# Patient Record
Sex: Female | Born: 1945 | Race: White | Hispanic: No | Smoking: Former smoker
Health system: Southern US, Community
[De-identification: ages and names within clinical notes are randomized; demographics above are authoritative.]

## PROBLEM LIST (undated history)

## (undated) DIAGNOSIS — M1990 Unspecified osteoarthritis, unspecified site: Secondary | ICD-10-CM

## (undated) DIAGNOSIS — R519 Headache, unspecified: Secondary | ICD-10-CM

## (undated) DIAGNOSIS — C801 Malignant (primary) neoplasm, unspecified: Secondary | ICD-10-CM

## (undated) DIAGNOSIS — I4891 Unspecified atrial fibrillation: Secondary | ICD-10-CM

## (undated) DIAGNOSIS — K219 Gastro-esophageal reflux disease without esophagitis: Secondary | ICD-10-CM

## (undated) DIAGNOSIS — E119 Type 2 diabetes mellitus without complications: Secondary | ICD-10-CM

## (undated) DIAGNOSIS — I10 Essential (primary) hypertension: Secondary | ICD-10-CM

## (undated) DIAGNOSIS — I350 Nonrheumatic aortic (valve) stenosis: Secondary | ICD-10-CM

## (undated) DIAGNOSIS — D649 Anemia, unspecified: Secondary | ICD-10-CM

## (undated) DIAGNOSIS — G4730 Sleep apnea, unspecified: Secondary | ICD-10-CM

## (undated) DIAGNOSIS — R51 Headache: Secondary | ICD-10-CM

## (undated) HISTORY — DX: Nonrheumatic aortic (valve) stenosis: I35.0

## (undated) HISTORY — DX: Unspecified atrial fibrillation: I48.91

## (undated) HISTORY — PX: BREAST BIOPSY: SHX20

## (undated) HISTORY — PX: OTHER SURGICAL HISTORY: SHX169

## (undated) HISTORY — PX: ABDOMINAL HYSTERECTOMY: SHX81

## (undated) HISTORY — PX: JOINT REPLACEMENT: SHX530

---

## 1998-03-19 ENCOUNTER — Ambulatory Visit (HOSPITAL_COMMUNITY): Admission: RE | Admit: 1998-03-19 | Discharge: 1998-03-19 | Payer: Self-pay | Admitting: Surgery

## 1999-09-14 ENCOUNTER — Encounter: Admission: RE | Admit: 1999-09-14 | Discharge: 1999-09-14 | Payer: Self-pay | Admitting: *Deleted

## 2000-03-08 ENCOUNTER — Emergency Department (HOSPITAL_COMMUNITY): Admission: EM | Admit: 2000-03-08 | Discharge: 2000-03-08 | Payer: Self-pay | Admitting: *Deleted

## 2000-03-08 ENCOUNTER — Encounter: Payer: Self-pay | Admitting: Emergency Medicine

## 2000-03-09 ENCOUNTER — Ambulatory Visit (HOSPITAL_COMMUNITY): Admission: RE | Admit: 2000-03-09 | Discharge: 2000-03-09 | Payer: Self-pay | Admitting: Orthopedic Surgery

## 2000-03-09 ENCOUNTER — Encounter: Payer: Self-pay | Admitting: Orthopedic Surgery

## 2000-09-14 ENCOUNTER — Encounter: Admission: RE | Admit: 2000-09-14 | Discharge: 2000-09-14 | Payer: Self-pay | Admitting: Family Medicine

## 2000-09-14 ENCOUNTER — Encounter: Payer: Self-pay | Admitting: Family Medicine

## 2000-11-10 ENCOUNTER — Ambulatory Visit (HOSPITAL_COMMUNITY): Admission: RE | Admit: 2000-11-10 | Discharge: 2000-11-10 | Payer: Self-pay | Admitting: Gastroenterology

## 2000-11-10 ENCOUNTER — Encounter (INDEPENDENT_AMBULATORY_CARE_PROVIDER_SITE_OTHER): Payer: Self-pay

## 2000-11-16 ENCOUNTER — Ambulatory Visit (HOSPITAL_COMMUNITY): Admission: RE | Admit: 2000-11-16 | Discharge: 2000-11-16 | Payer: Self-pay | Admitting: Orthopedic Surgery

## 2001-09-15 ENCOUNTER — Encounter: Payer: Self-pay | Admitting: Family Medicine

## 2001-09-15 ENCOUNTER — Encounter: Admission: RE | Admit: 2001-09-15 | Discharge: 2001-09-15 | Payer: Self-pay | Admitting: Family Medicine

## 2003-10-18 ENCOUNTER — Inpatient Hospital Stay (HOSPITAL_COMMUNITY): Admission: RE | Admit: 2003-10-18 | Discharge: 2003-10-22 | Payer: Self-pay | Admitting: Orthopedic Surgery

## 2006-09-07 ENCOUNTER — Ambulatory Visit (HOSPITAL_COMMUNITY): Admission: RE | Admit: 2006-09-07 | Discharge: 2006-09-07 | Payer: Self-pay | Admitting: Gastroenterology

## 2006-09-07 ENCOUNTER — Encounter (INDEPENDENT_AMBULATORY_CARE_PROVIDER_SITE_OTHER): Payer: Self-pay | Admitting: *Deleted

## 2006-09-23 ENCOUNTER — Ambulatory Visit (HOSPITAL_COMMUNITY): Admission: RE | Admit: 2006-09-23 | Discharge: 2006-09-23 | Payer: Self-pay | Admitting: Gastroenterology

## 2007-08-08 ENCOUNTER — Ambulatory Visit: Payer: Self-pay | Admitting: Internal Medicine

## 2007-08-09 ENCOUNTER — Ambulatory Visit (HOSPITAL_BASED_OUTPATIENT_CLINIC_OR_DEPARTMENT_OTHER): Admission: RE | Admit: 2007-08-09 | Discharge: 2007-08-09 | Payer: Self-pay | Admitting: Internal Medicine

## 2007-08-12 ENCOUNTER — Ambulatory Visit: Payer: Self-pay | Admitting: Internal Medicine

## 2007-08-24 ENCOUNTER — Ambulatory Visit: Payer: Self-pay | Admitting: Internal Medicine

## 2007-10-13 ENCOUNTER — Ambulatory Visit: Payer: Self-pay | Admitting: Internal Medicine

## 2007-11-22 ENCOUNTER — Encounter: Payer: Self-pay | Admitting: Internal Medicine

## 2008-01-11 DIAGNOSIS — G4733 Obstructive sleep apnea (adult) (pediatric): Secondary | ICD-10-CM | POA: Insufficient documentation

## 2008-01-12 ENCOUNTER — Ambulatory Visit: Payer: Self-pay | Admitting: Internal Medicine

## 2008-01-17 ENCOUNTER — Ambulatory Visit (HOSPITAL_COMMUNITY): Admission: RE | Admit: 2008-01-17 | Discharge: 2008-01-17 | Payer: Self-pay | Admitting: Family Medicine

## 2008-01-17 IMAGING — US US TRANSVAGINAL NON-OB
1 series · 14 of 25 positions shown · non-contrast
Comparison: none

CLINICAL DATA: Pelvic pressure and urinary frequency.  Previous hysterectomy.  Evaluate for pelvic or ovarian mass.
 TRANSABDOMINAL AND TRANSVAGINAL PELVIC ULTRASOUND:
TECHNIQUE: Both transabdominal and transvaginal ultrasound examinations of the pelvis were performed including evaluation of the uterus, ovaries, adnexal regions, and pelvic cul-de-sac.

[Series 1: us transvaginal non-ob · 14 of 43 slices shown]
[im 1/43]
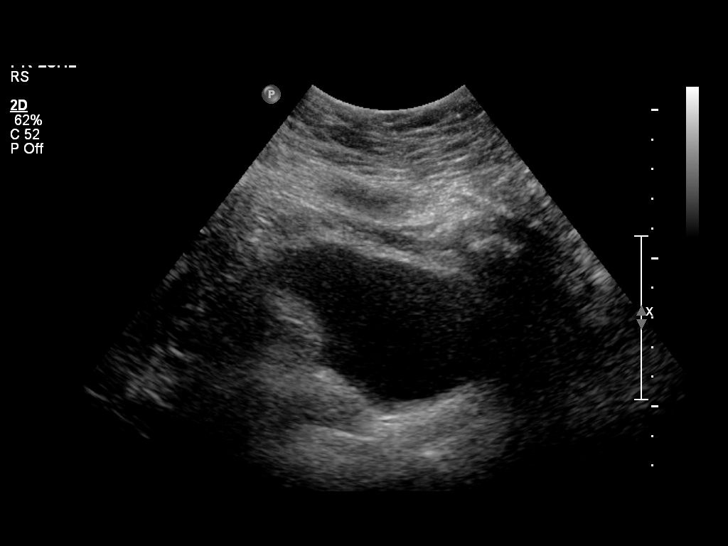
[im 4/43]
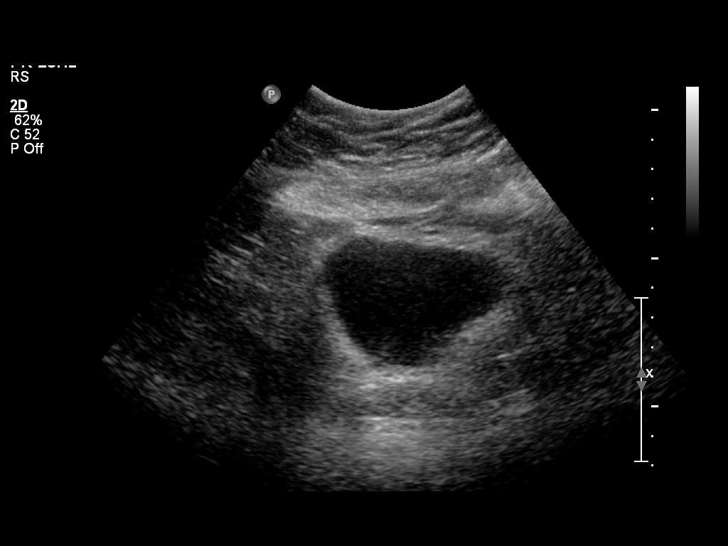
[im 8/43]
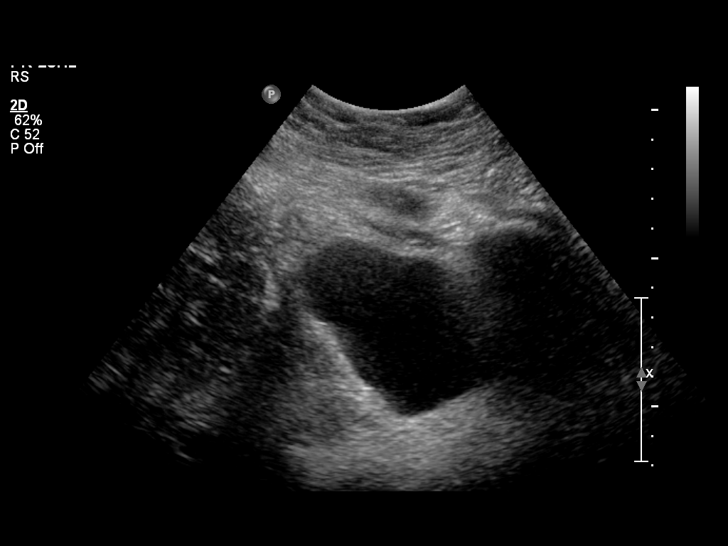
[im 11/43]
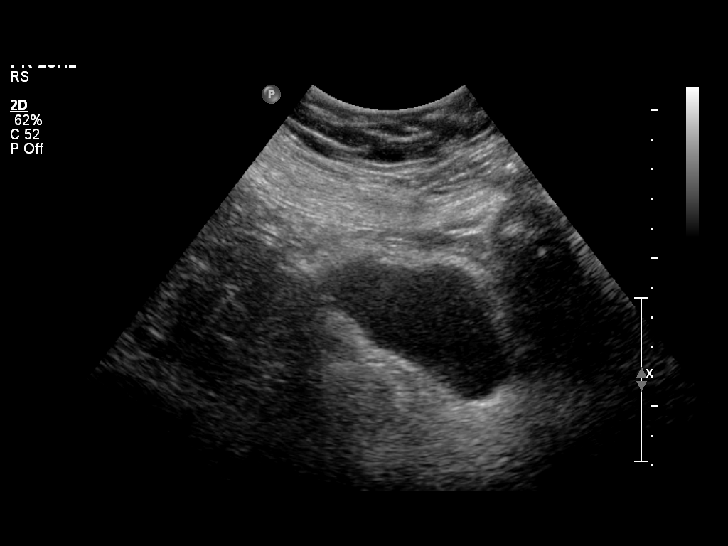
[im 15/43]
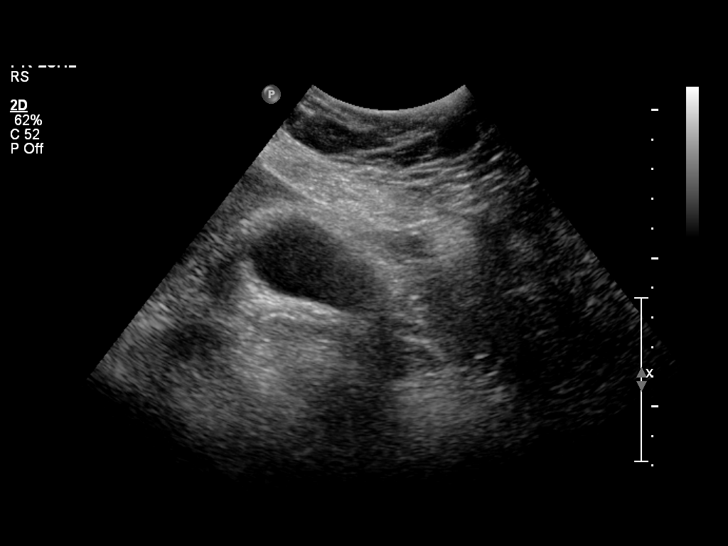
[im 16/43]
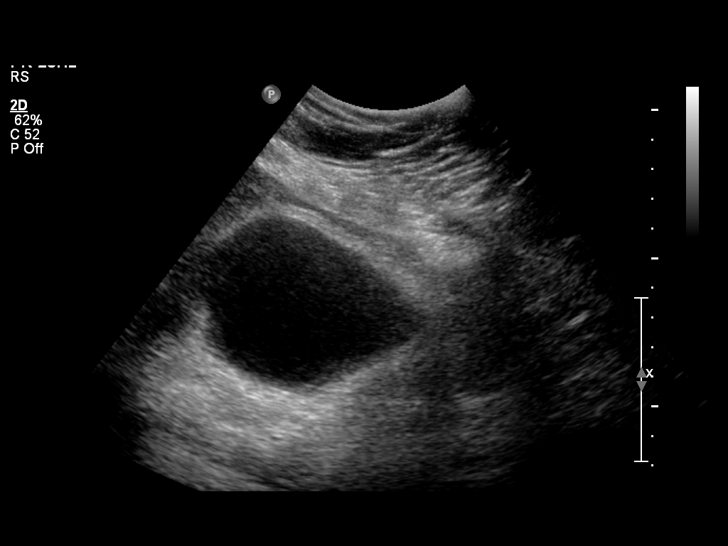
[im 20/43]
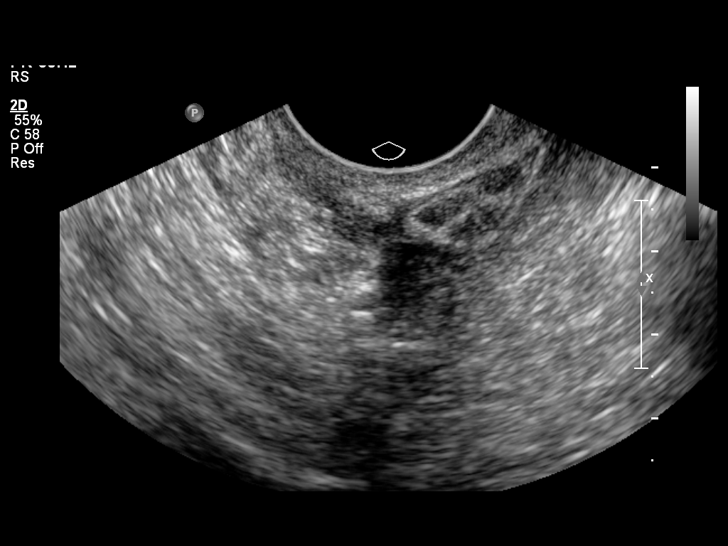
[im 23/43]
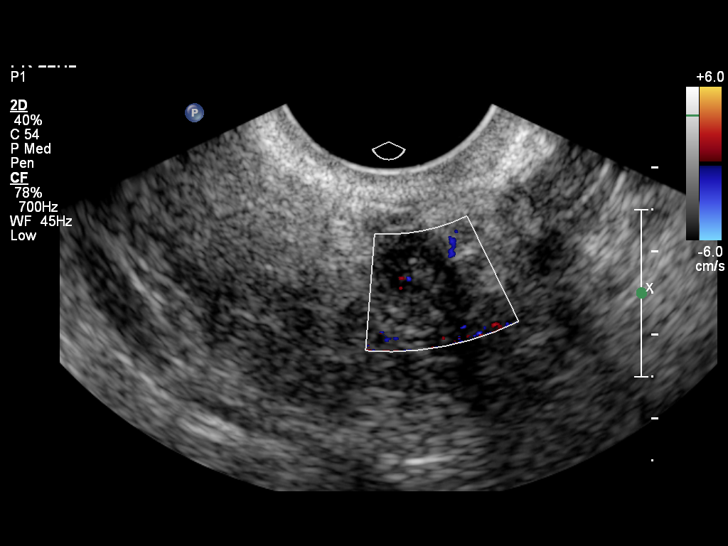
[im 27/43]
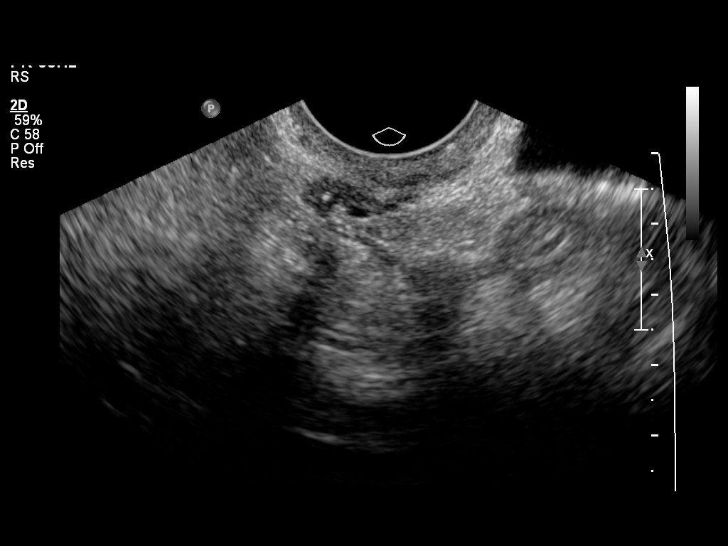
[im 29/43]
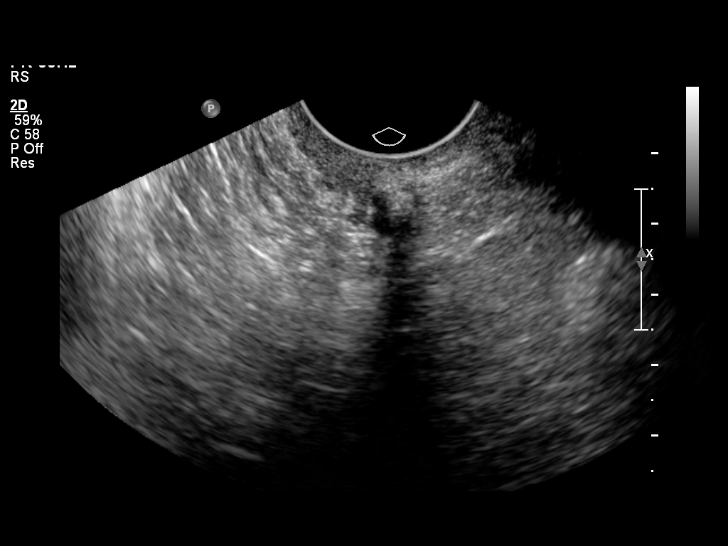
[im 32/43]
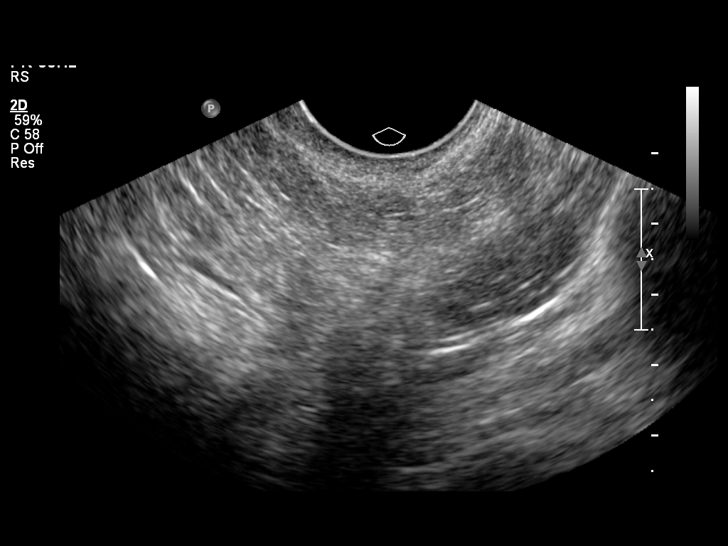
[im 36/43]
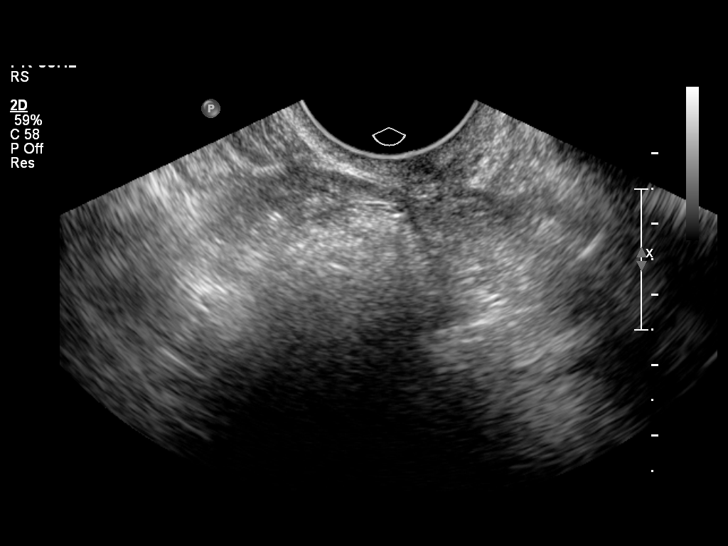
[im 39/43]
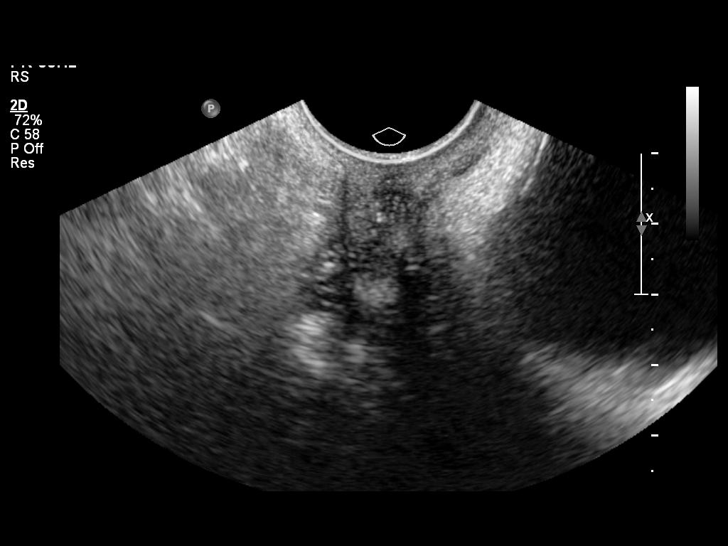
[im 43/43]
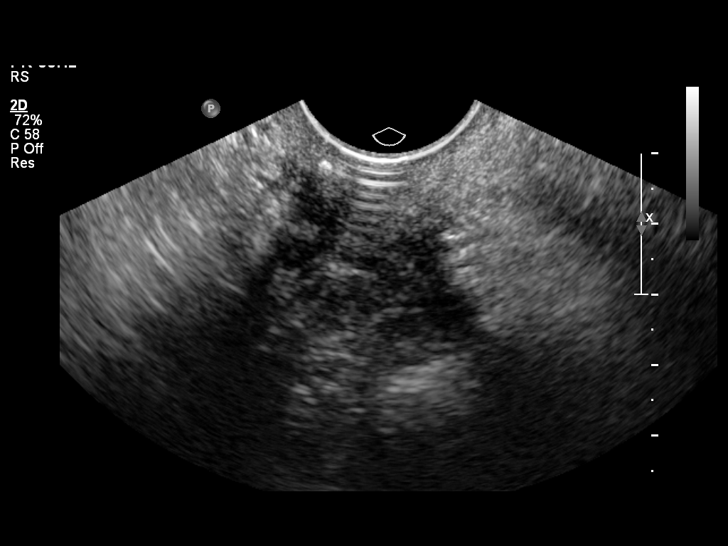

[14 of 25 positions shown; findings below may reference images not displayed]

FINDINGS: The uterus is surgically absent.  The vaginal cuff is unremarkable in appearance.  The urinary bladder is also unremarkable in appearance on transabdominal sonography.
 A small postmenopausal left ovary is visualized on transvaginal sonography.  The right ovary was not directly visualized by either transabdominal or transvaginal approach.  However, no adnexal masses are identified.  There is no evidence of free fluid.
IMPRESSION: 1.  Prior hysterectomy.
 2.  No evidence of pelvic or adnexal mass.  Normal postmenopausal left ovary.  Nonvisualization of right ovary.

## 2008-02-05 ENCOUNTER — Encounter: Admission: RE | Admit: 2008-02-05 | Discharge: 2008-02-05 | Payer: Self-pay | Admitting: Family Medicine

## 2008-02-06 ENCOUNTER — Emergency Department (HOSPITAL_COMMUNITY): Admission: EM | Admit: 2008-02-06 | Discharge: 2008-02-06 | Payer: Self-pay | Admitting: Emergency Medicine

## 2008-02-06 IMAGING — CR DG CHEST 2V
3 series · 3 of 3 positions shown · non-contrast
Comparison: none

Addendum BeginsOriginal report by Dr. ROSESCU.  Following addendum by Dr. ROSESCU on [DATE]: 
 Diffuse bony demineralization is noted. 

 Addendum Ends
CLINICAL DATA: Low-grade fever.  Cough.  Shortness of breath.
 [RH] VIEWS:
 Cardiomegaly and pulmonary vascular congestion.  Chronic bronchitic changes.  Negative for pneumonia.  Ectatic tortuous aorta.

[w chest pa]
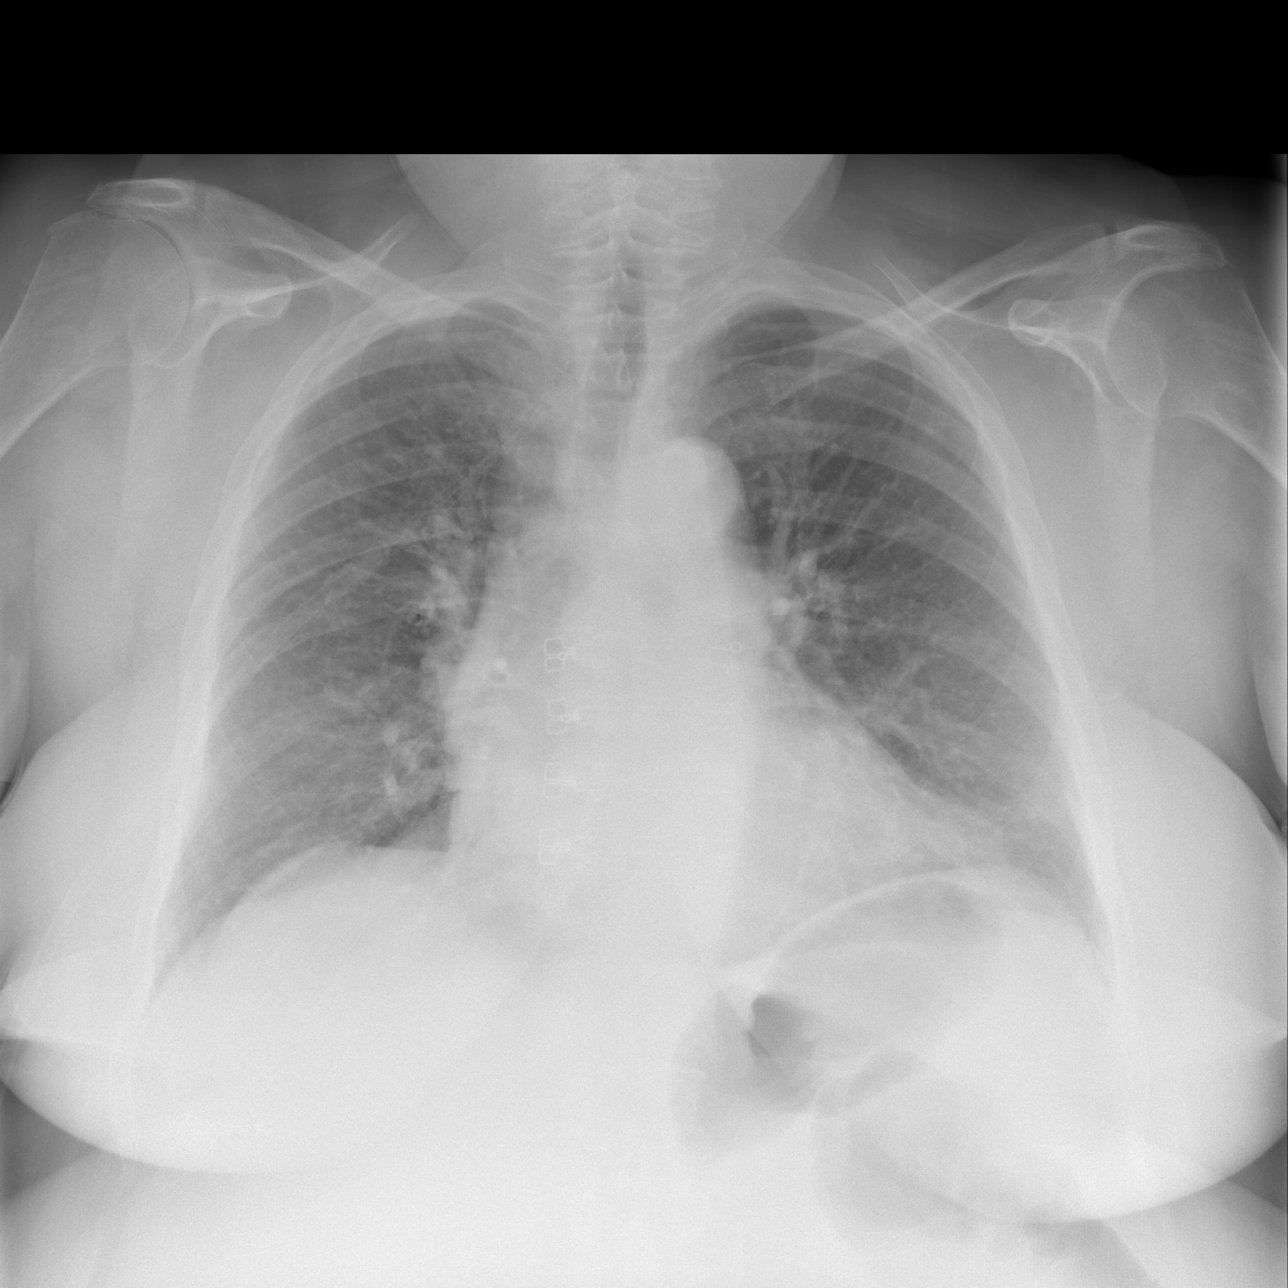

[w chest lat *]
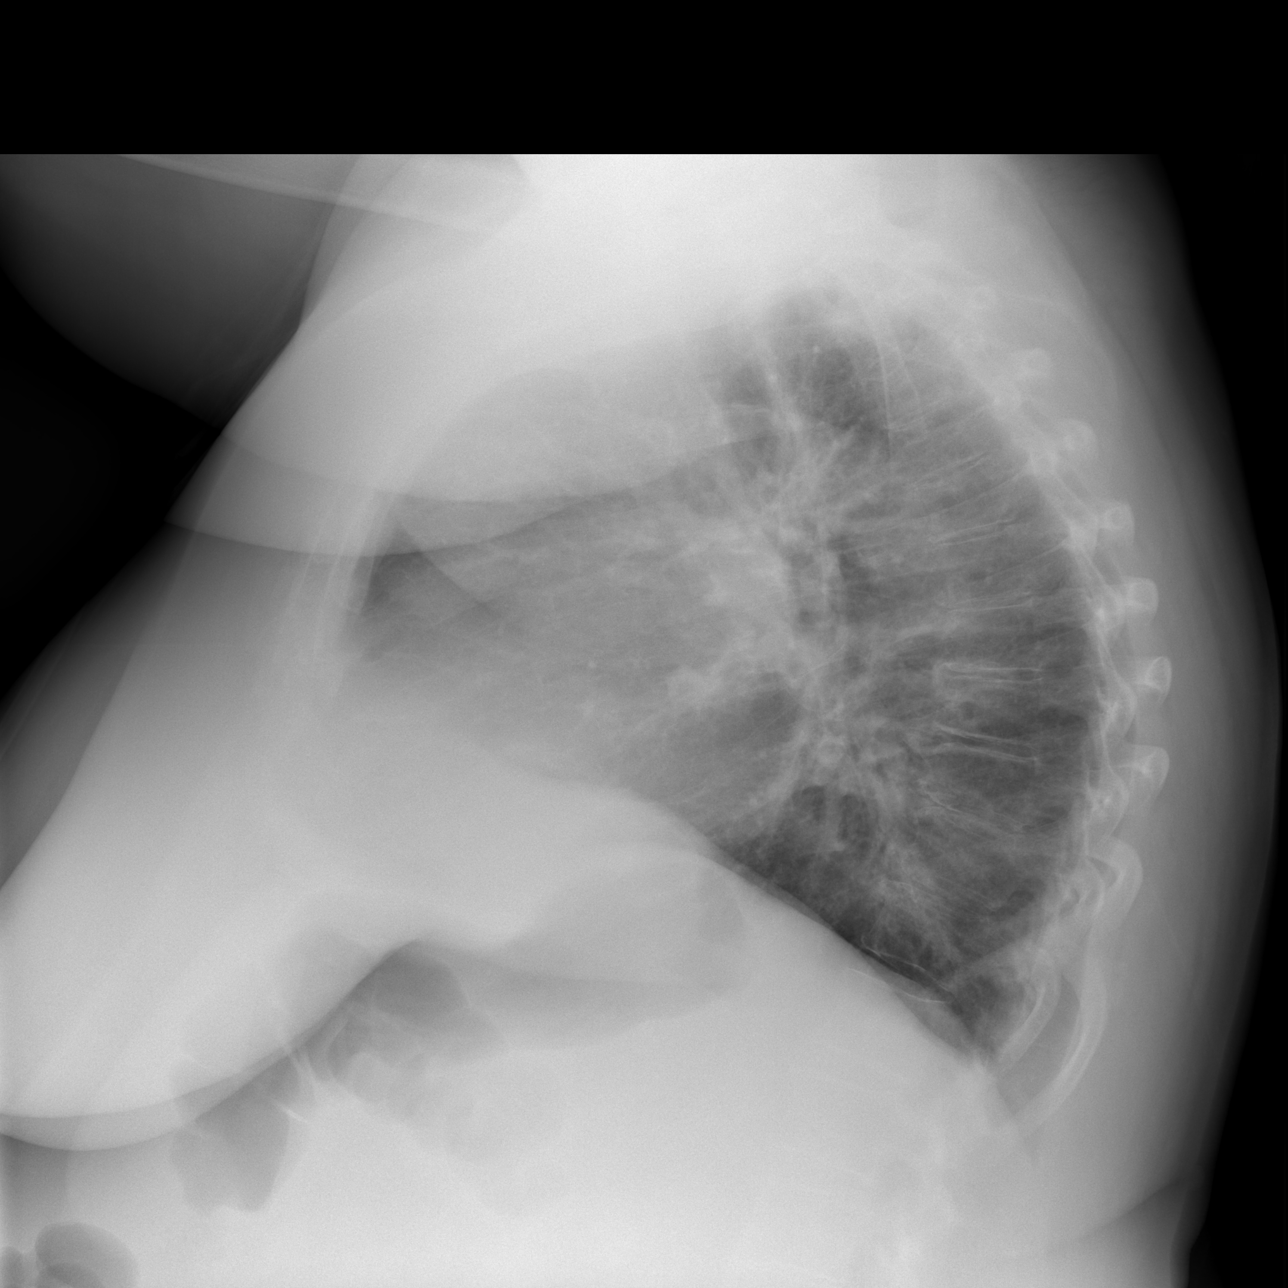

[w chest pa *]
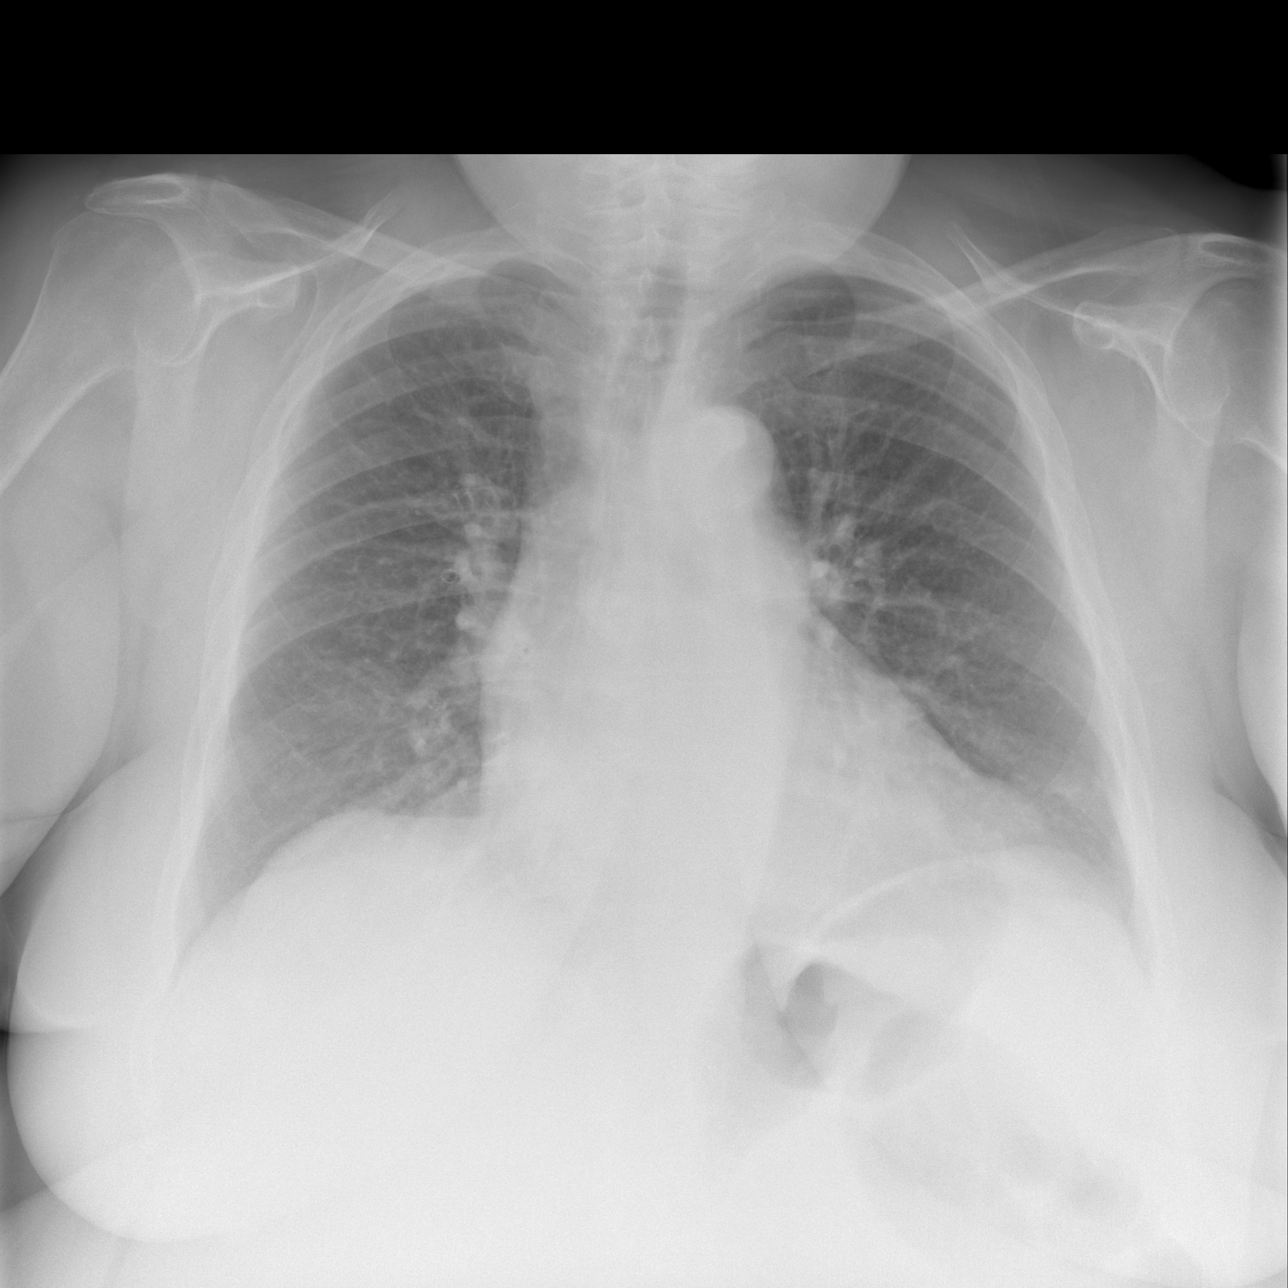

[3 of 3 positions shown; findings below may reference images not displayed]

IMPRESSION: Chronic bronchitic changes.  Cardiomegaly and probable pulmonary venous hypertension.

## 2008-02-15 ENCOUNTER — Emergency Department (HOSPITAL_COMMUNITY): Admission: EM | Admit: 2008-02-15 | Discharge: 2008-02-15 | Payer: Self-pay | Admitting: Emergency Medicine

## 2008-08-13 IMAGING — CR DG RIBS W/ CHEST 3+V*R*
5 series · 5 of 5 positions shown · non-contrast
Comparison: [DATE].

CLINICAL DATA: Cough, rib pain, breathing difficulty.
 CHEST AND RIGHT RIBS ? 3 VIEWS ? [DATE]:

[w chest pa *]
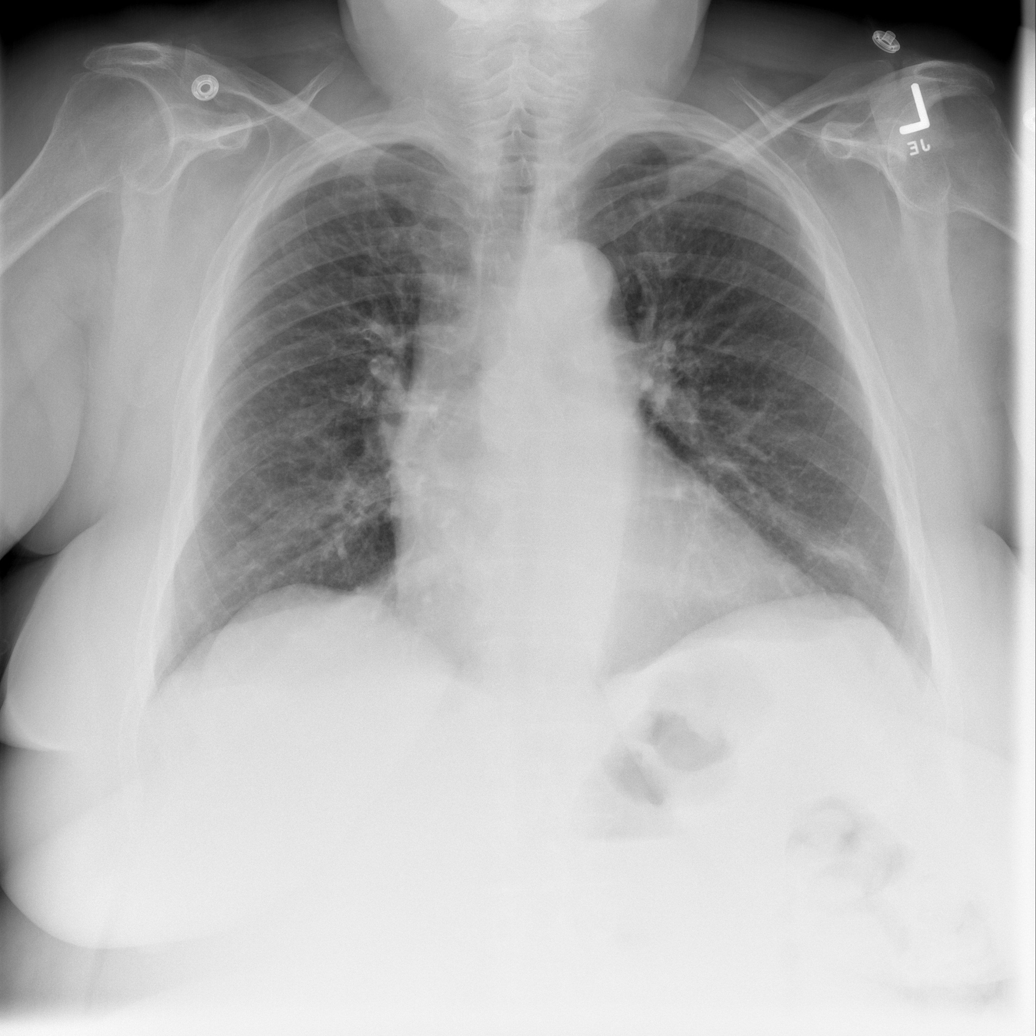

[w ribs ap/pa upper right]
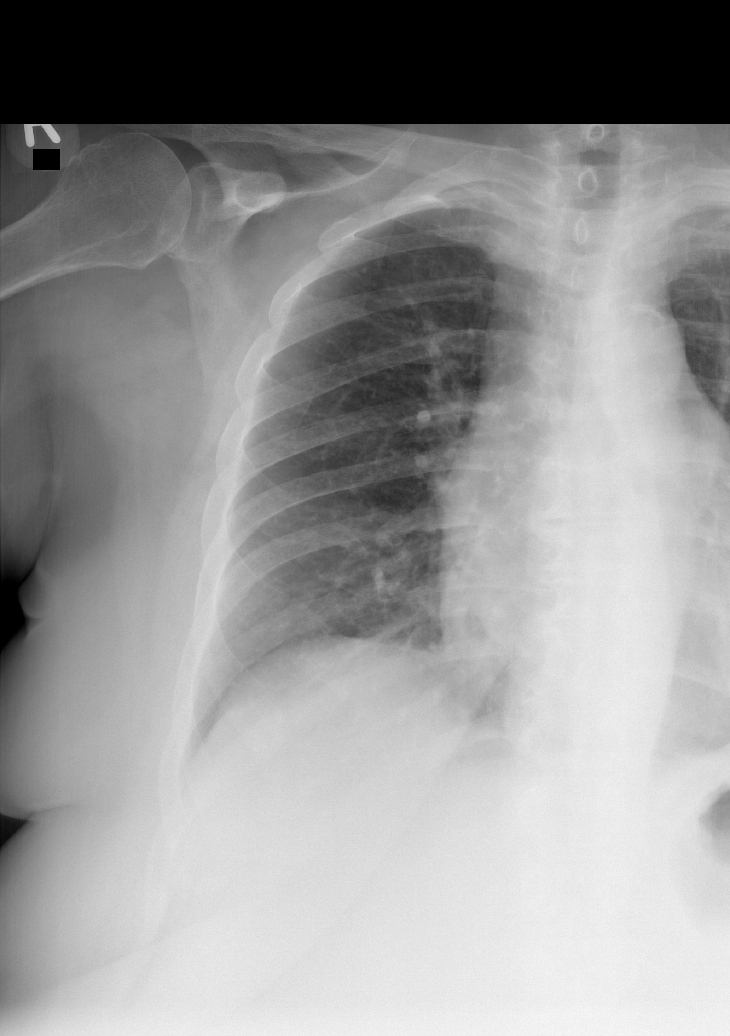

[w ribs ap/pa lower right]
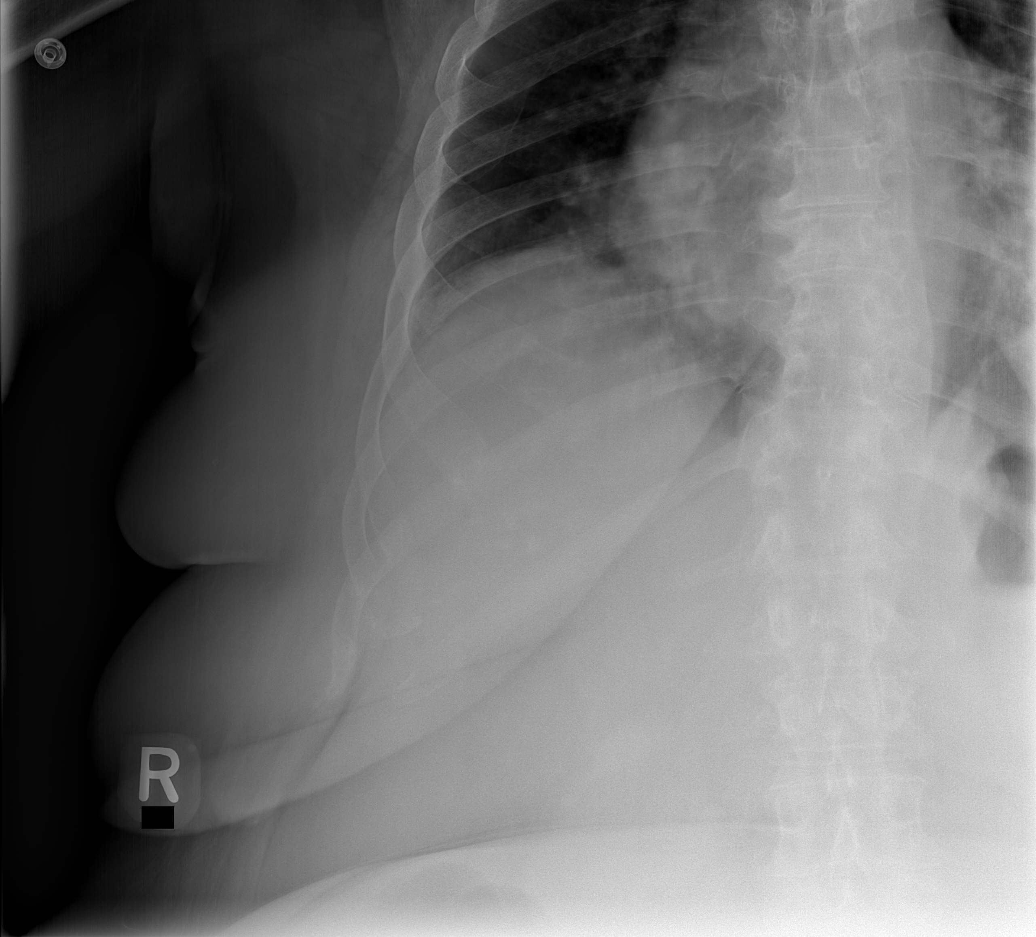

[w ribs oblique right (1 of 2)]
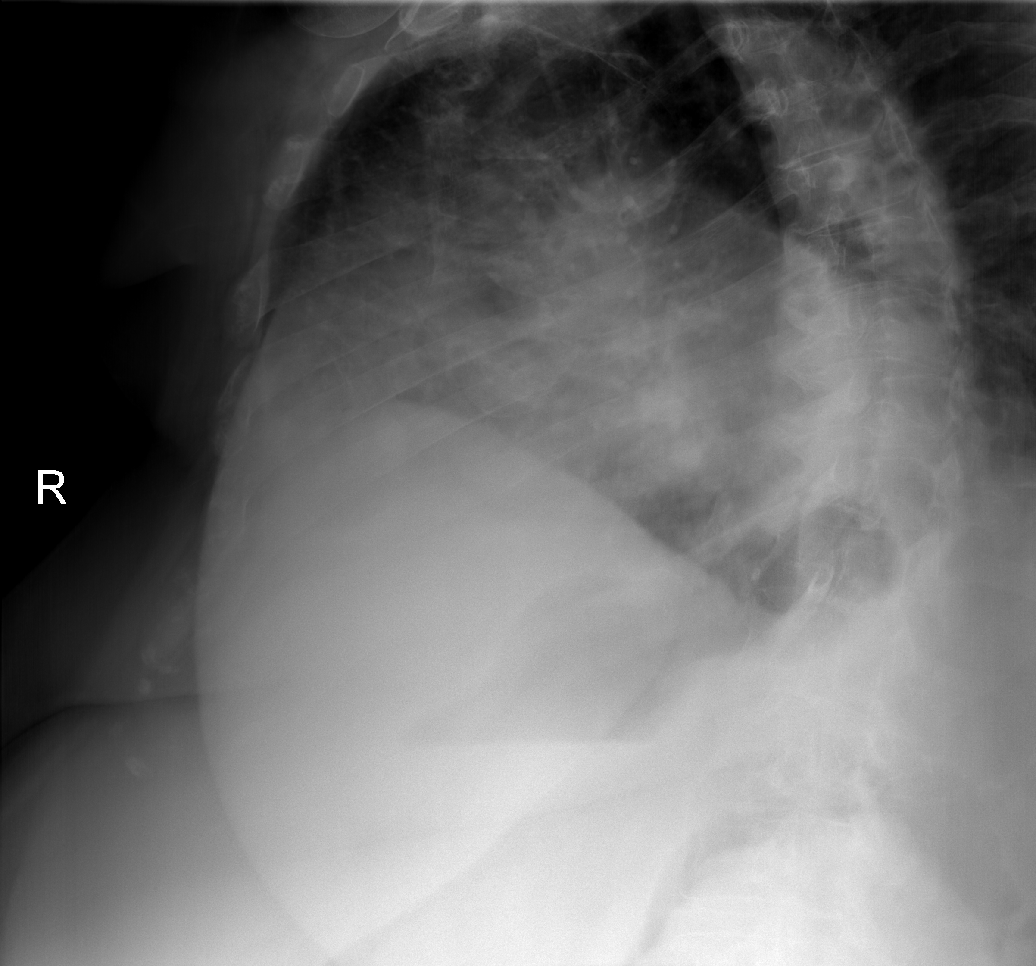

[w ribs oblique right (2 of 2)]
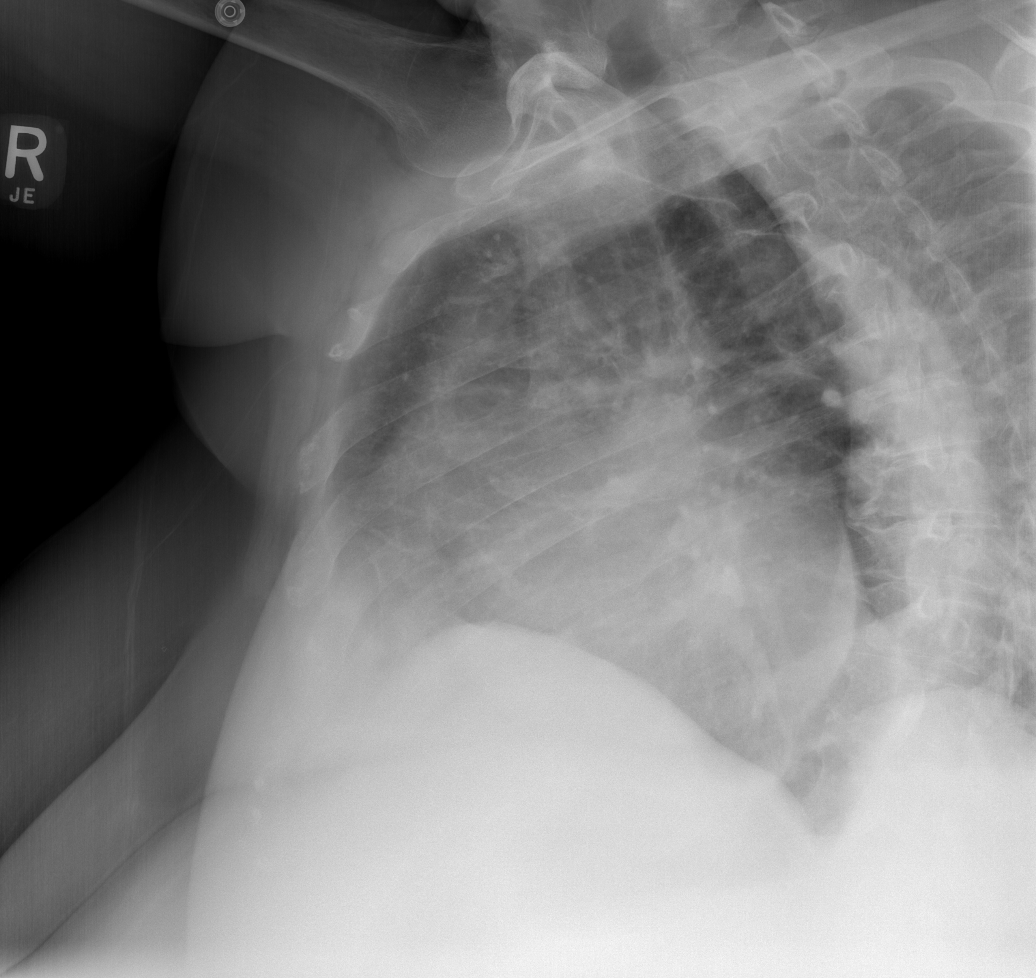

[5 of 5 positions shown; findings below may reference images not displayed]

FINDINGS: Frontal view of the chest shows a midline trachea and stable heart size.  Left basilar atelectasis.  The lungs are otherwise clear.   
 No definite rib fracture.
IMPRESSION: Left basilar atelectasis.

## 2008-11-08 ENCOUNTER — Ambulatory Visit (HOSPITAL_COMMUNITY): Admission: RE | Admit: 2008-11-08 | Discharge: 2008-11-08 | Payer: Self-pay | Admitting: Gastroenterology

## 2009-02-05 ENCOUNTER — Encounter: Admission: RE | Admit: 2009-02-05 | Discharge: 2009-02-05 | Payer: Self-pay | Admitting: Family Medicine

## 2009-08-25 ENCOUNTER — Emergency Department (HOSPITAL_COMMUNITY): Admission: EM | Admit: 2009-08-25 | Discharge: 2009-08-25 | Payer: Self-pay | Admitting: Emergency Medicine

## 2009-08-25 IMAGING — CR DG CHEST 2V
2 series · 2 of 2 positions shown · non-contrast
Comparison: [DATE]

CLINICAL DATA: Seizures.  Weakness.  Shortness of breath came on
suddenly this morning.

CHEST - 2 VIEW

[w chest pa]
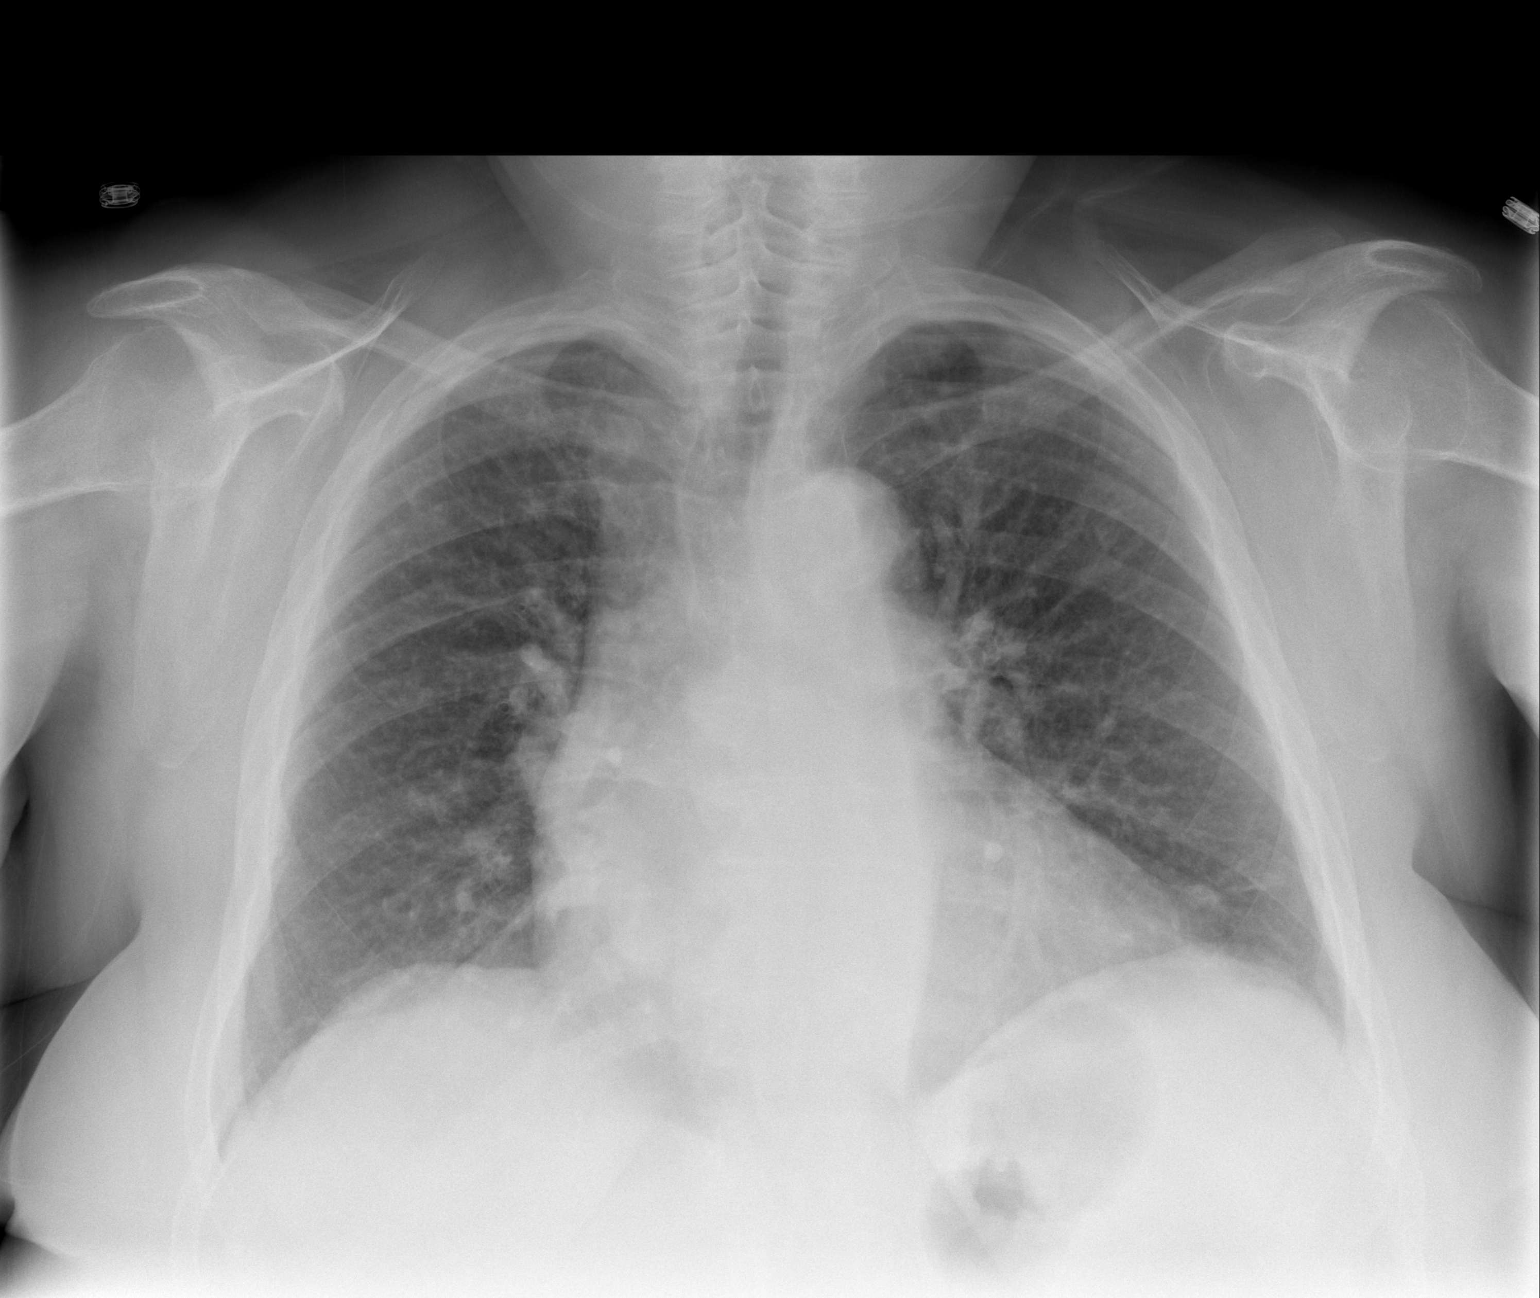

[w chest lat]
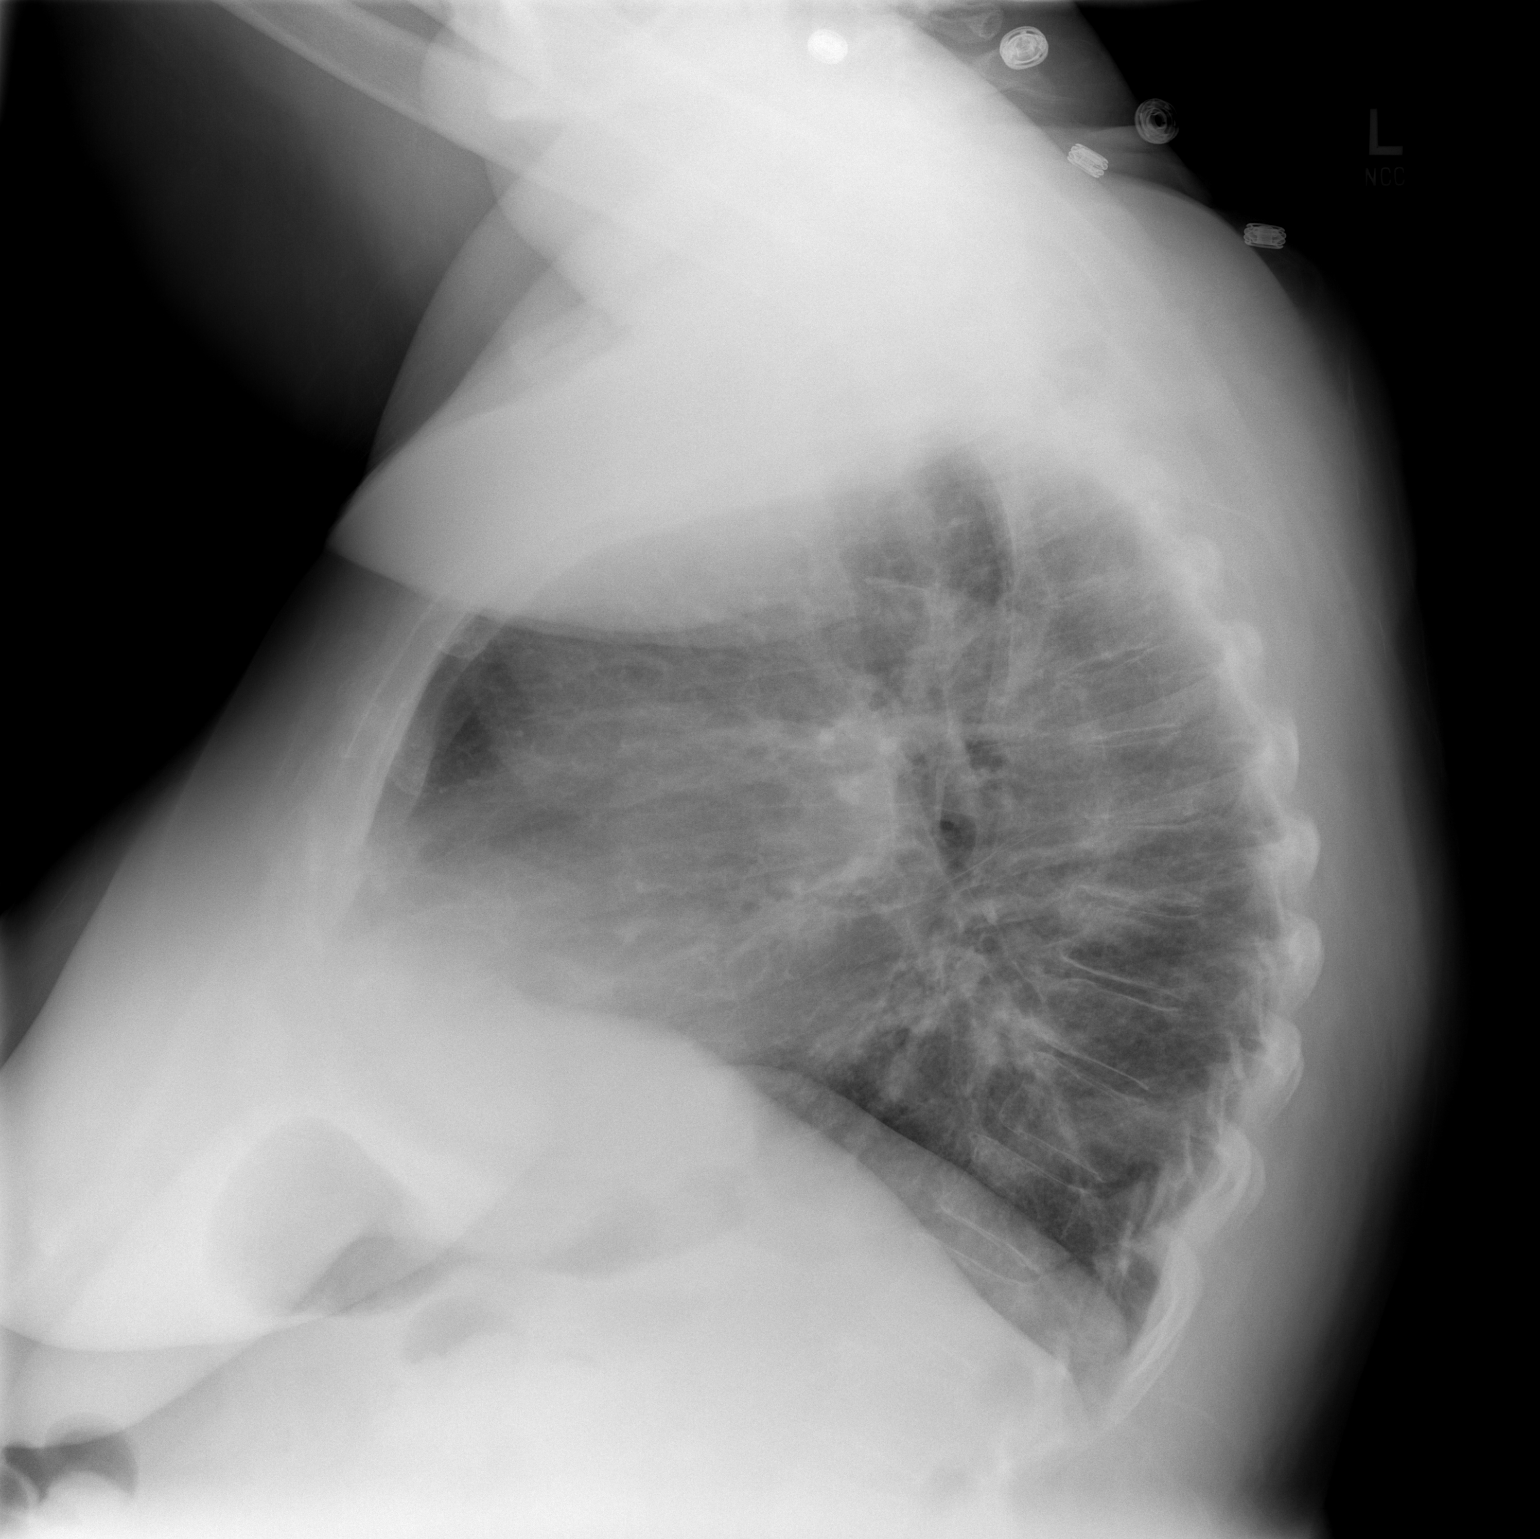

[2 of 2 positions shown; findings below may reference images not displayed]

FINDINGS: Heart is enlarged.  There is pulmonary vascular
congestion.  Prominence of interstitial markings is probably
chronic.  There is left basilar scar.  No focal consolidations or
pleural effusions are identified.  There are degenerative changes
within the thoracic spine.
IMPRESSION: Cardiomegaly and pulmonary vascular congestion.

## 2009-10-18 ENCOUNTER — Emergency Department (HOSPITAL_COMMUNITY): Admission: EM | Admit: 2009-10-18 | Discharge: 2009-10-18 | Payer: Self-pay | Admitting: Emergency Medicine

## 2009-10-18 IMAGING — CR DG ANKLE COMPLETE 3+V*R*
3 series · 3 of 3 positions shown · non-contrast
Comparison: None

CLINICAL DATA: Ankle pain

RIGHT ANKLE - COMPLETE 3+ VIEW

[x ankle ap right]
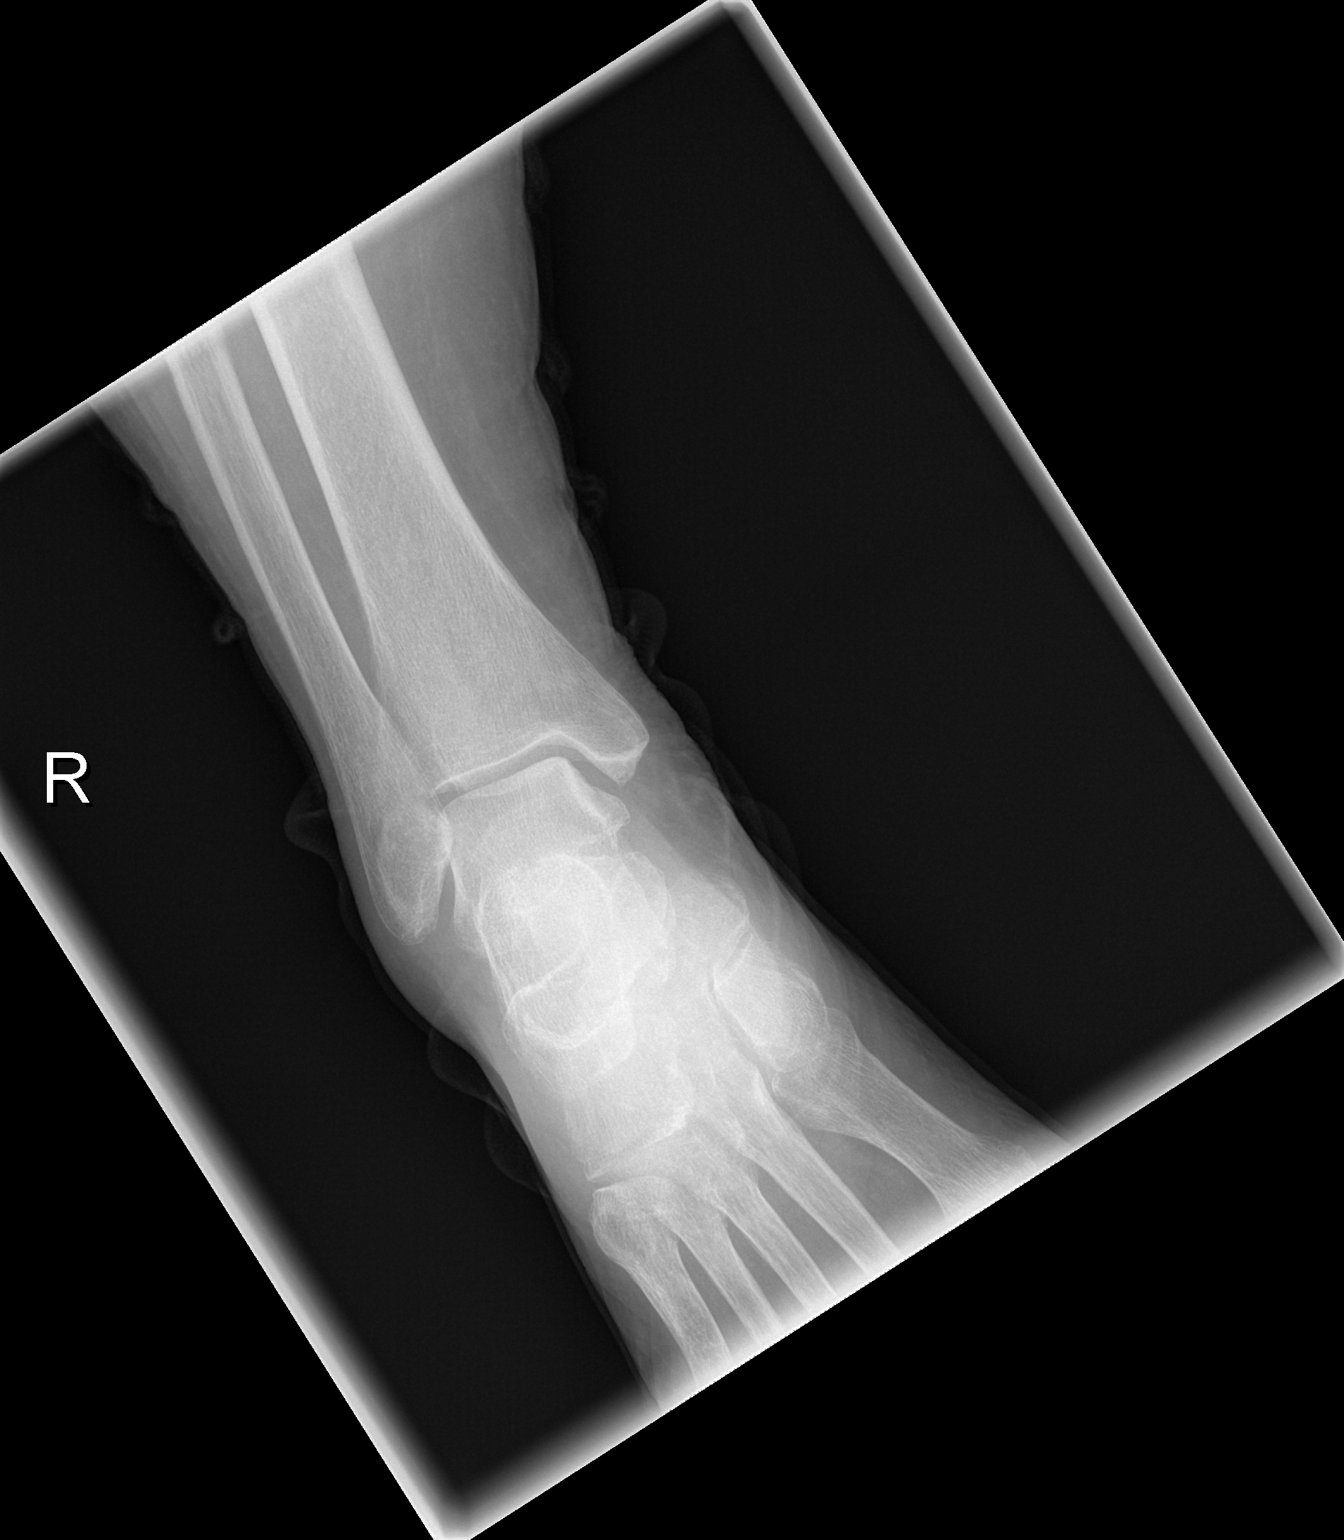

[x ankle obl right]
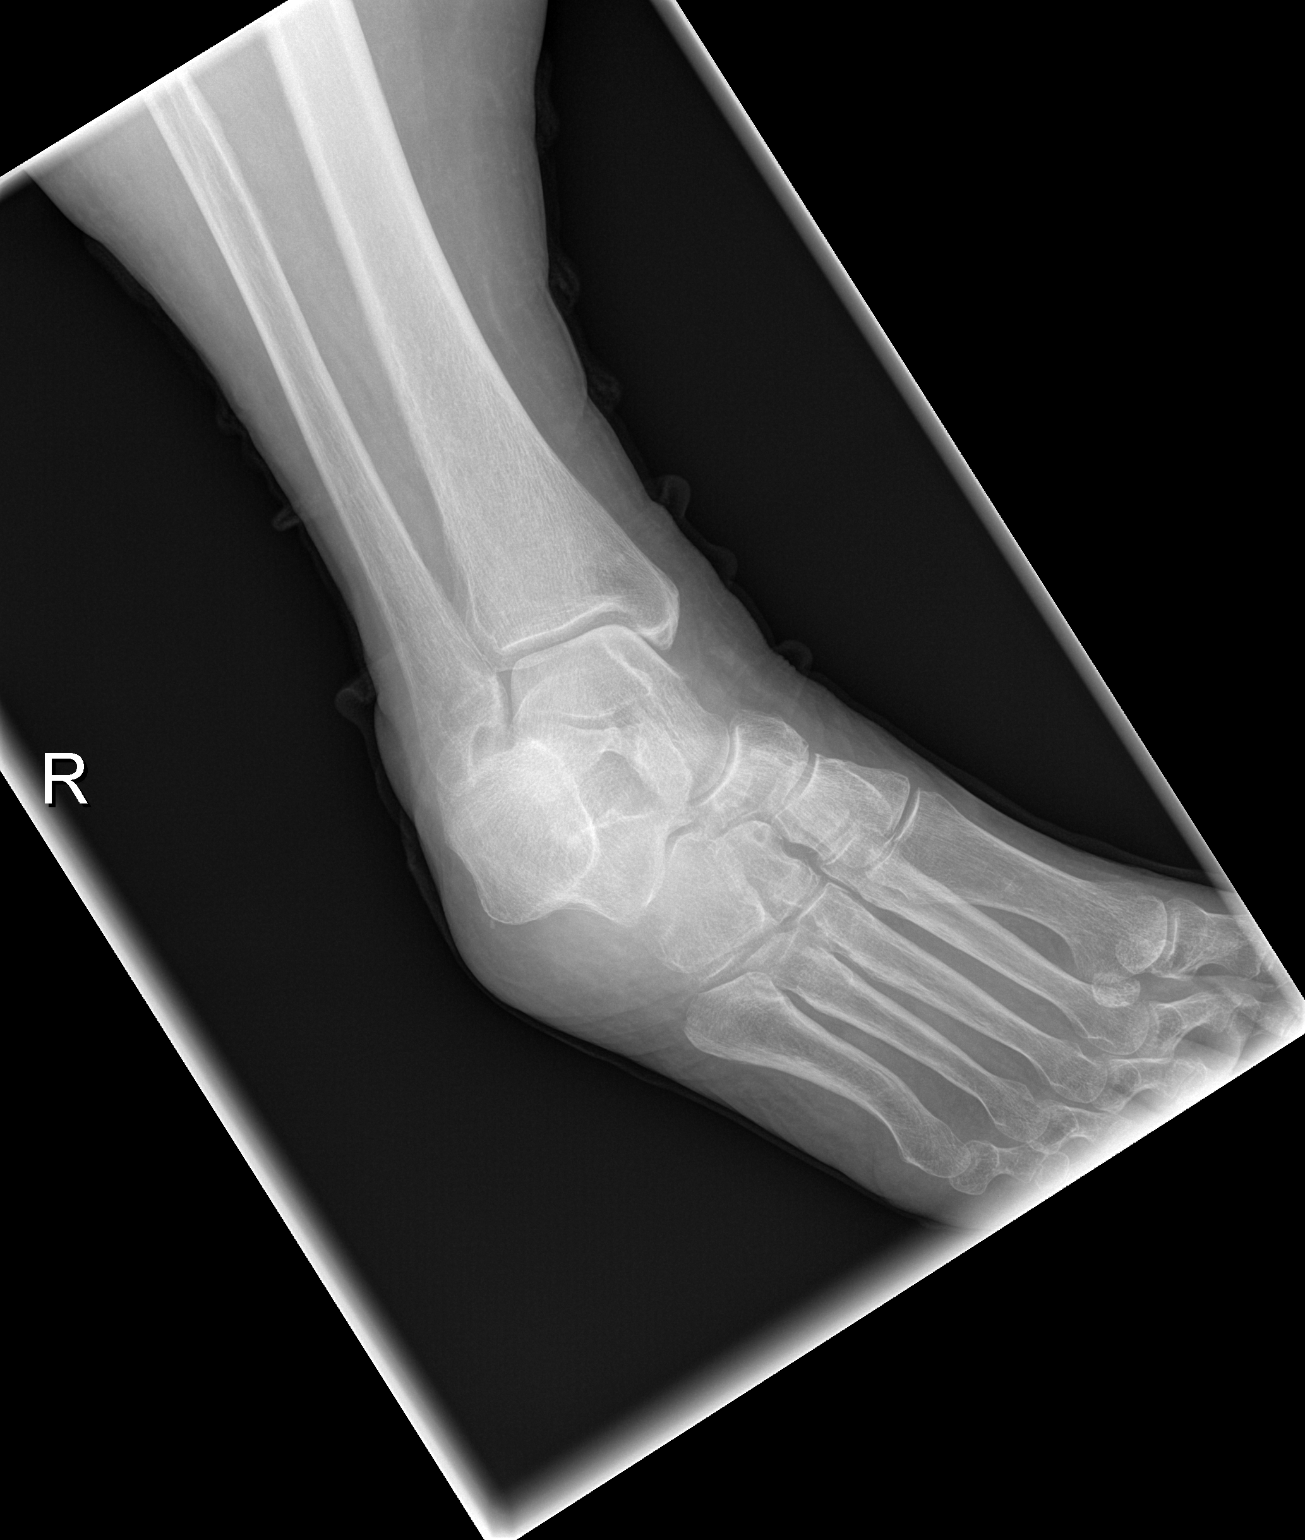

[x ankle lat right]
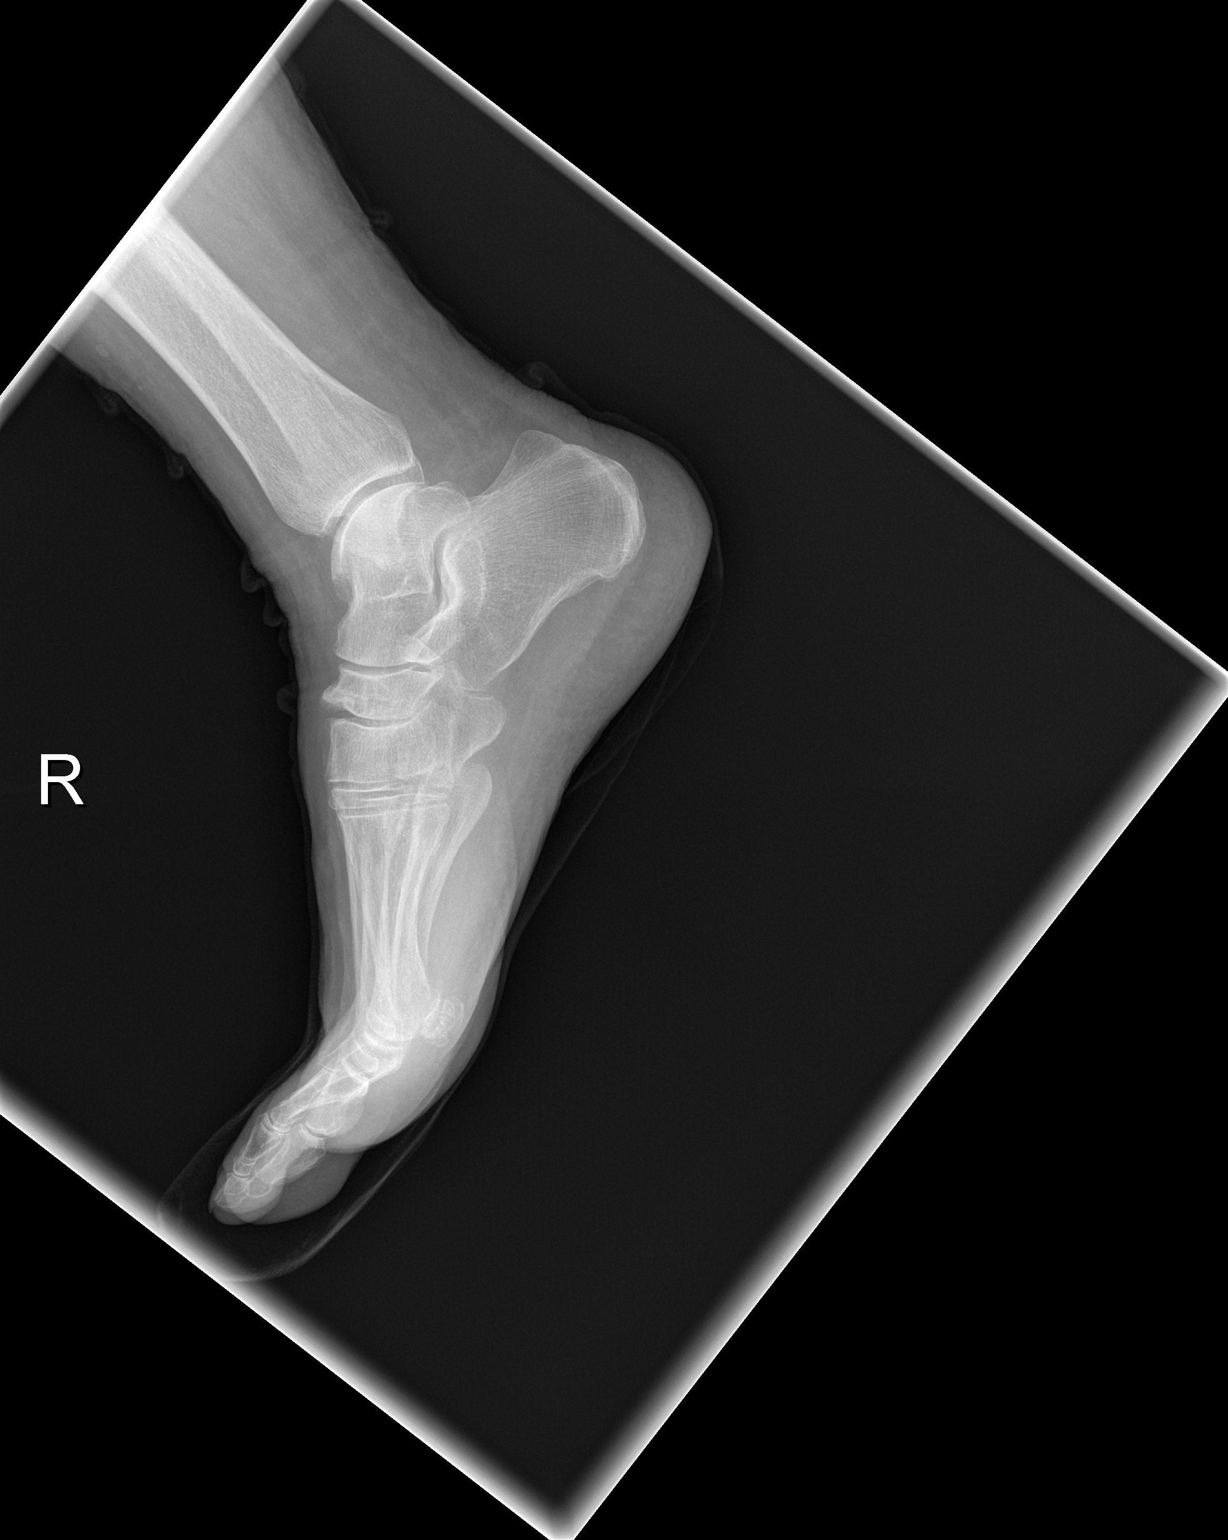

[3 of 3 positions shown; findings below may reference images not displayed]

FINDINGS: Ankle mortise is intact.  The talar dome is normal.  No
evidence of malleolar fracture.  No joint effusion.
IMPRESSION: No evidence of ankle fracture.

## 2010-02-06 ENCOUNTER — Encounter: Admission: RE | Admit: 2010-02-06 | Discharge: 2010-02-06 | Payer: Self-pay | Admitting: Family Medicine

## 2010-11-18 ENCOUNTER — Encounter
Admission: RE | Admit: 2010-11-18 | Discharge: 2010-11-18 | Payer: Self-pay | Source: Home / Self Care | Attending: Gastroenterology | Admitting: Gastroenterology

## 2010-11-18 IMAGING — CT CT ABD-PELV W/ CM
1 of 5 series · 14 of 36 positions shown, 19 images · IV contrast (30CC OMNI 300 & [ID] OMNI 300)
Comparison: None.

CLINICAL DATA: Colon cancer.  Anemia with bright red blood in the
stool.  Nausea.  Evaluate for metastatic disease.

CT ABDOMEN AND PELVIS WITH CONTRAST
TECHNIQUE: Multidetector CT imaging of the abdomen and pelvis was
performed following the standard protocol during bolus
administration of intravenous contrast.
Contrast: 125 ml [QB]

[Series 2: a&pw/ · axial · 0.98mm/px · z∈[-436,-21]mm · 14 of 95 slices shown, 19 images]
[im 6/95  soft-tissue]
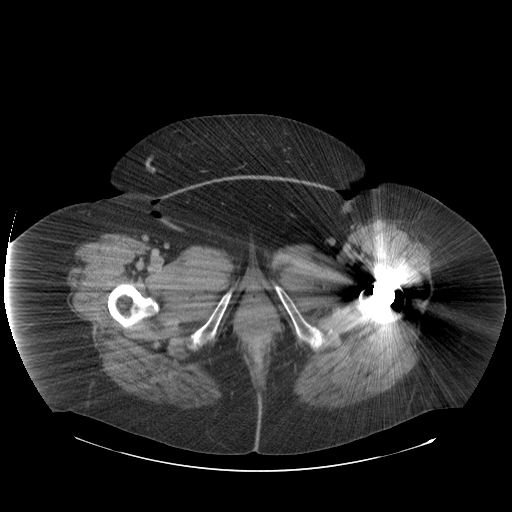
[im 6/95  bone]
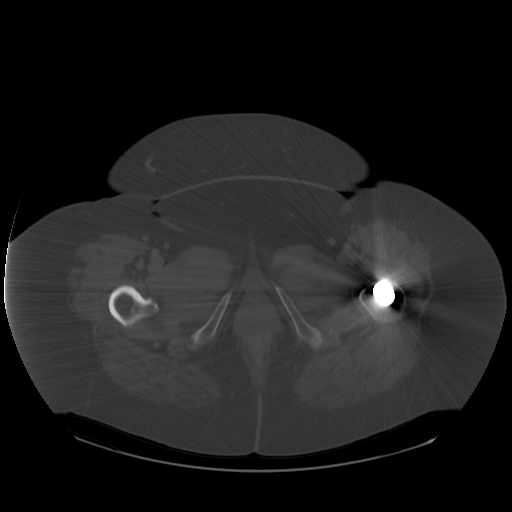
[im 11/95  soft-tissue]
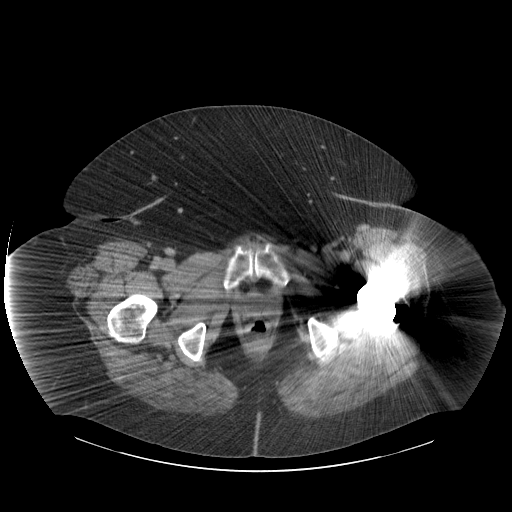
[im 27/95  soft-tissue]
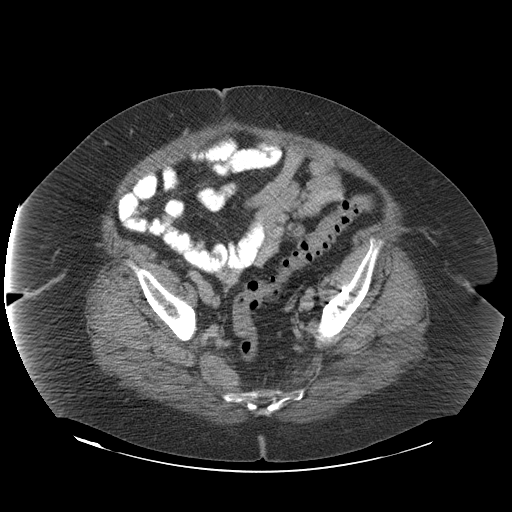
[im 32/95  soft-tissue]
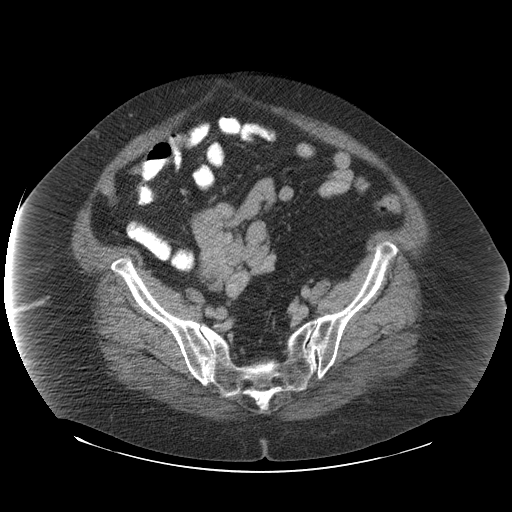
[im 37/95  soft-tissue]
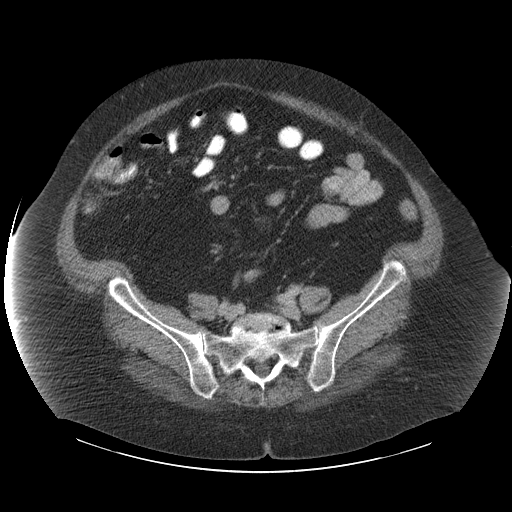
[im 42/95  soft-tissue]
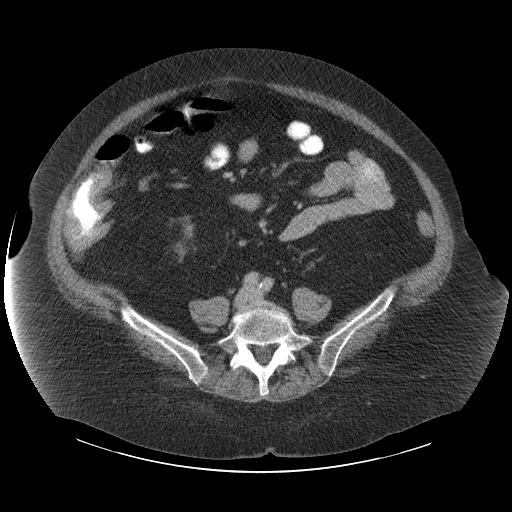
[im 53/95  soft-tissue]
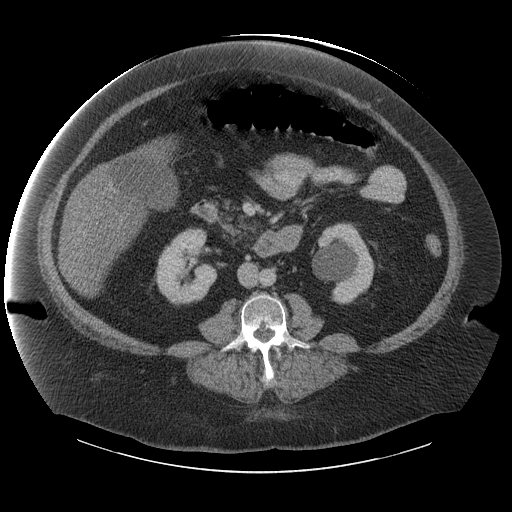
[im 58/95  soft-tissue]
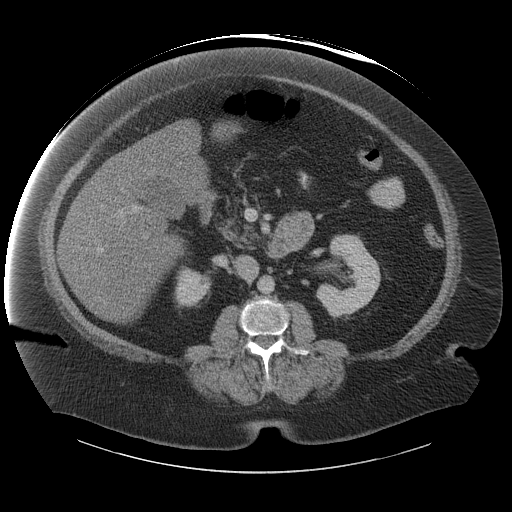
[im 63/95  soft-tissue]
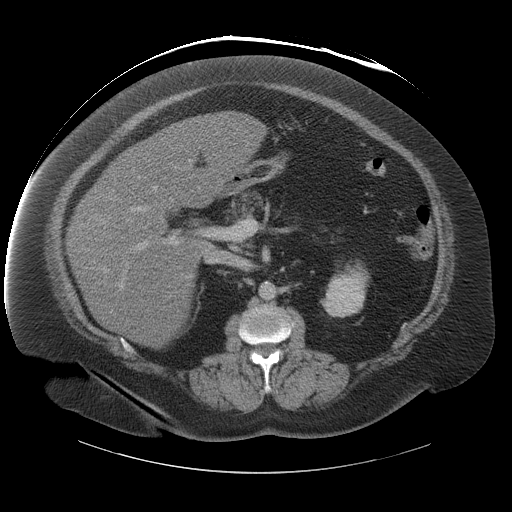
[im 63/95  bone]
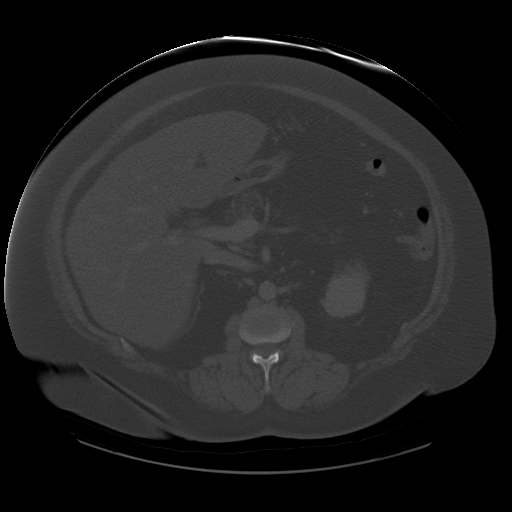
[im 68/95  soft-tissue]
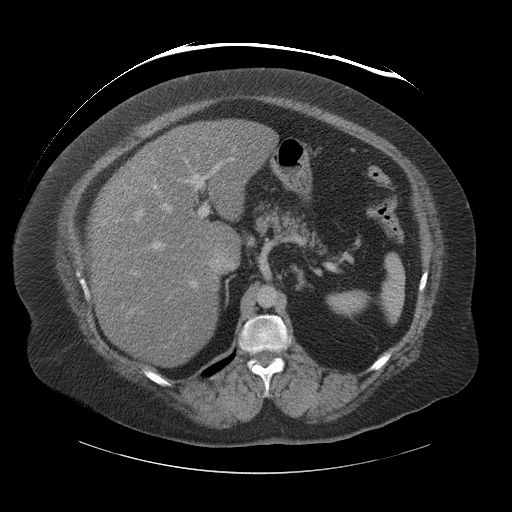
[im 74/95  soft-tissue]
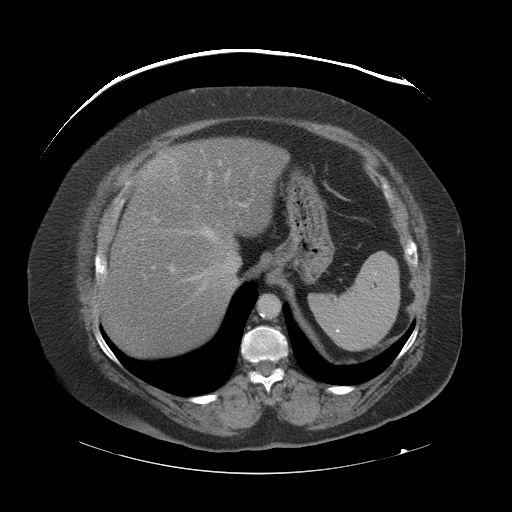
[im 74/95  lung]
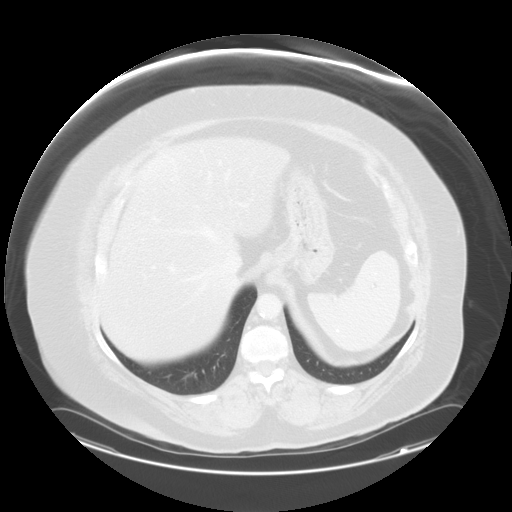
[im 79/95  lung]
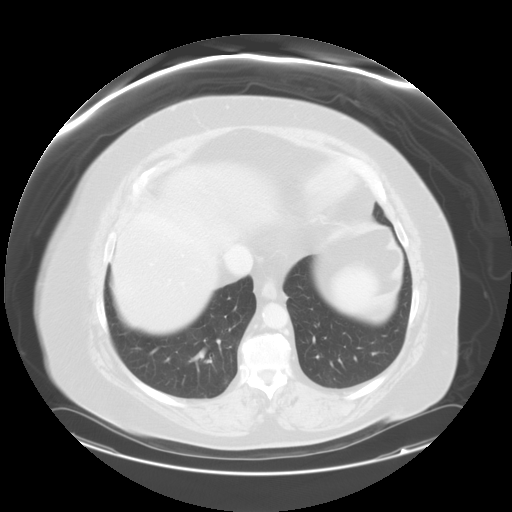
[im 84/95  soft-tissue]
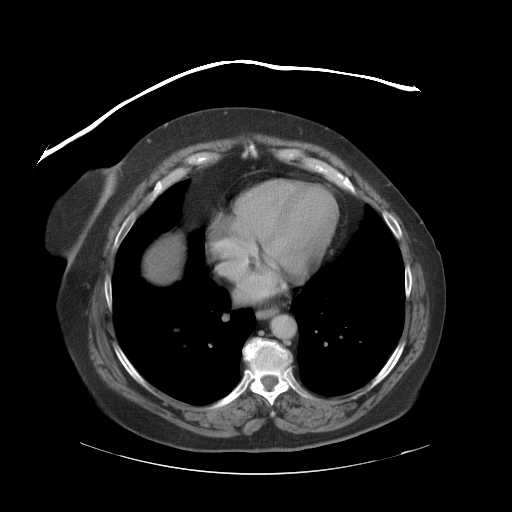
[im 84/95  lung]
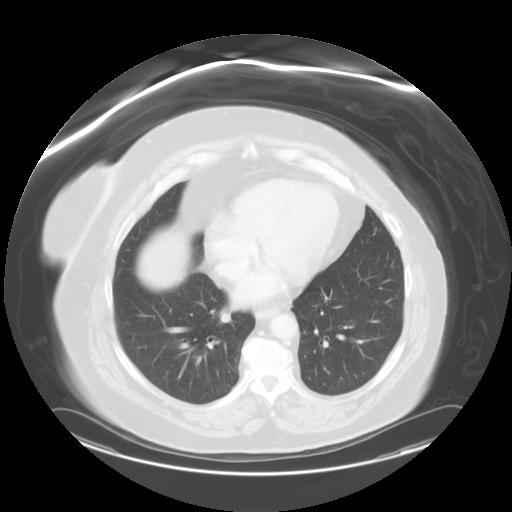
[im 89/95  soft-tissue]
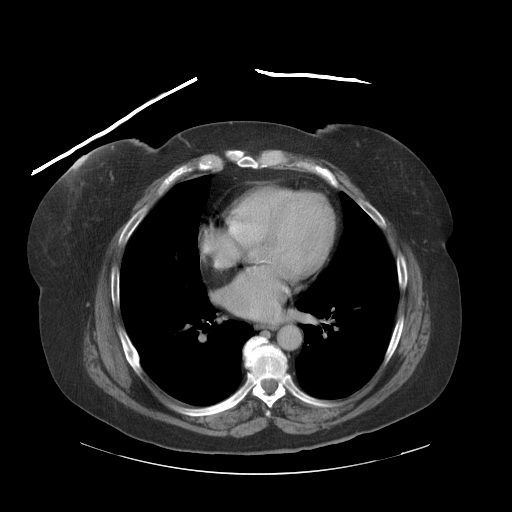
[im 89/95  lung]
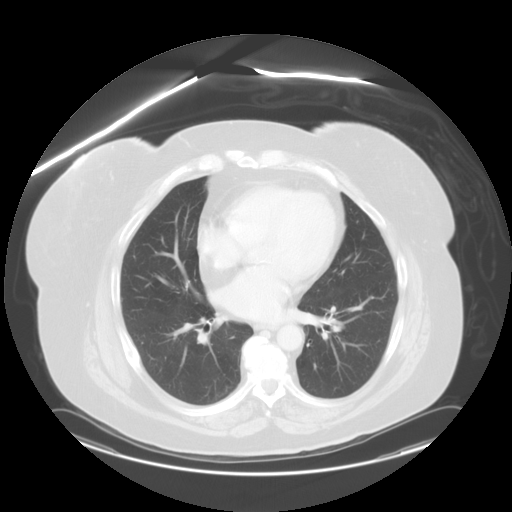

[14 of 36 positions shown; findings below may reference images not displayed]

FINDINGS: Lung bases show probable subpleural lymph node along the
right major fissure.  Heart is at the upper limits of normal in
size.  No pericardial or pleural effusion.

Liver is decreased in attenuation diffusely.  There are some areas
of peripheral sparing.  Gallbladder, adrenal glands and right
kidney are unremarkable.  A 3.7 x 4.2 cm low attenuation lesion in
the left kidney is likely a cyst.  Pancreas, stomach and small
bowel are unremarkable.  There is irregular thickening of the cecal
wall.  Remainder of the colon is unremarkable.  Ileocolic
mesenteric lymph nodes measure up to approximately 11 mm in short
axis.  No free fluid.
IMPRESSION: 1.  Cecal carcinoma with borderline enlarged ileocolic mesenteric
lymph nodes.
2.  Hepatic steatosis.

## 2010-11-29 DIAGNOSIS — C801 Malignant (primary) neoplasm, unspecified: Secondary | ICD-10-CM

## 2010-11-29 HISTORY — PX: COLON SURGERY: SHX602

## 2010-11-29 HISTORY — DX: Malignant (primary) neoplasm, unspecified: C80.1

## 2010-12-17 ENCOUNTER — Ambulatory Visit (HOSPITAL_COMMUNITY)
Admission: RE | Admit: 2010-12-17 | Discharge: 2010-12-17 | Payer: Self-pay | Source: Home / Self Care | Attending: General Surgery | Admitting: General Surgery

## 2010-12-17 IMAGING — CR DG CHEST 2V
2 series · 2 of 2 positions shown · non-contrast
Comparison: [DATE]

CLINICAL DATA: Preop for cecal carcinoma
.

CHEST - 2 VIEW

[view not recorded (1 of 2)]
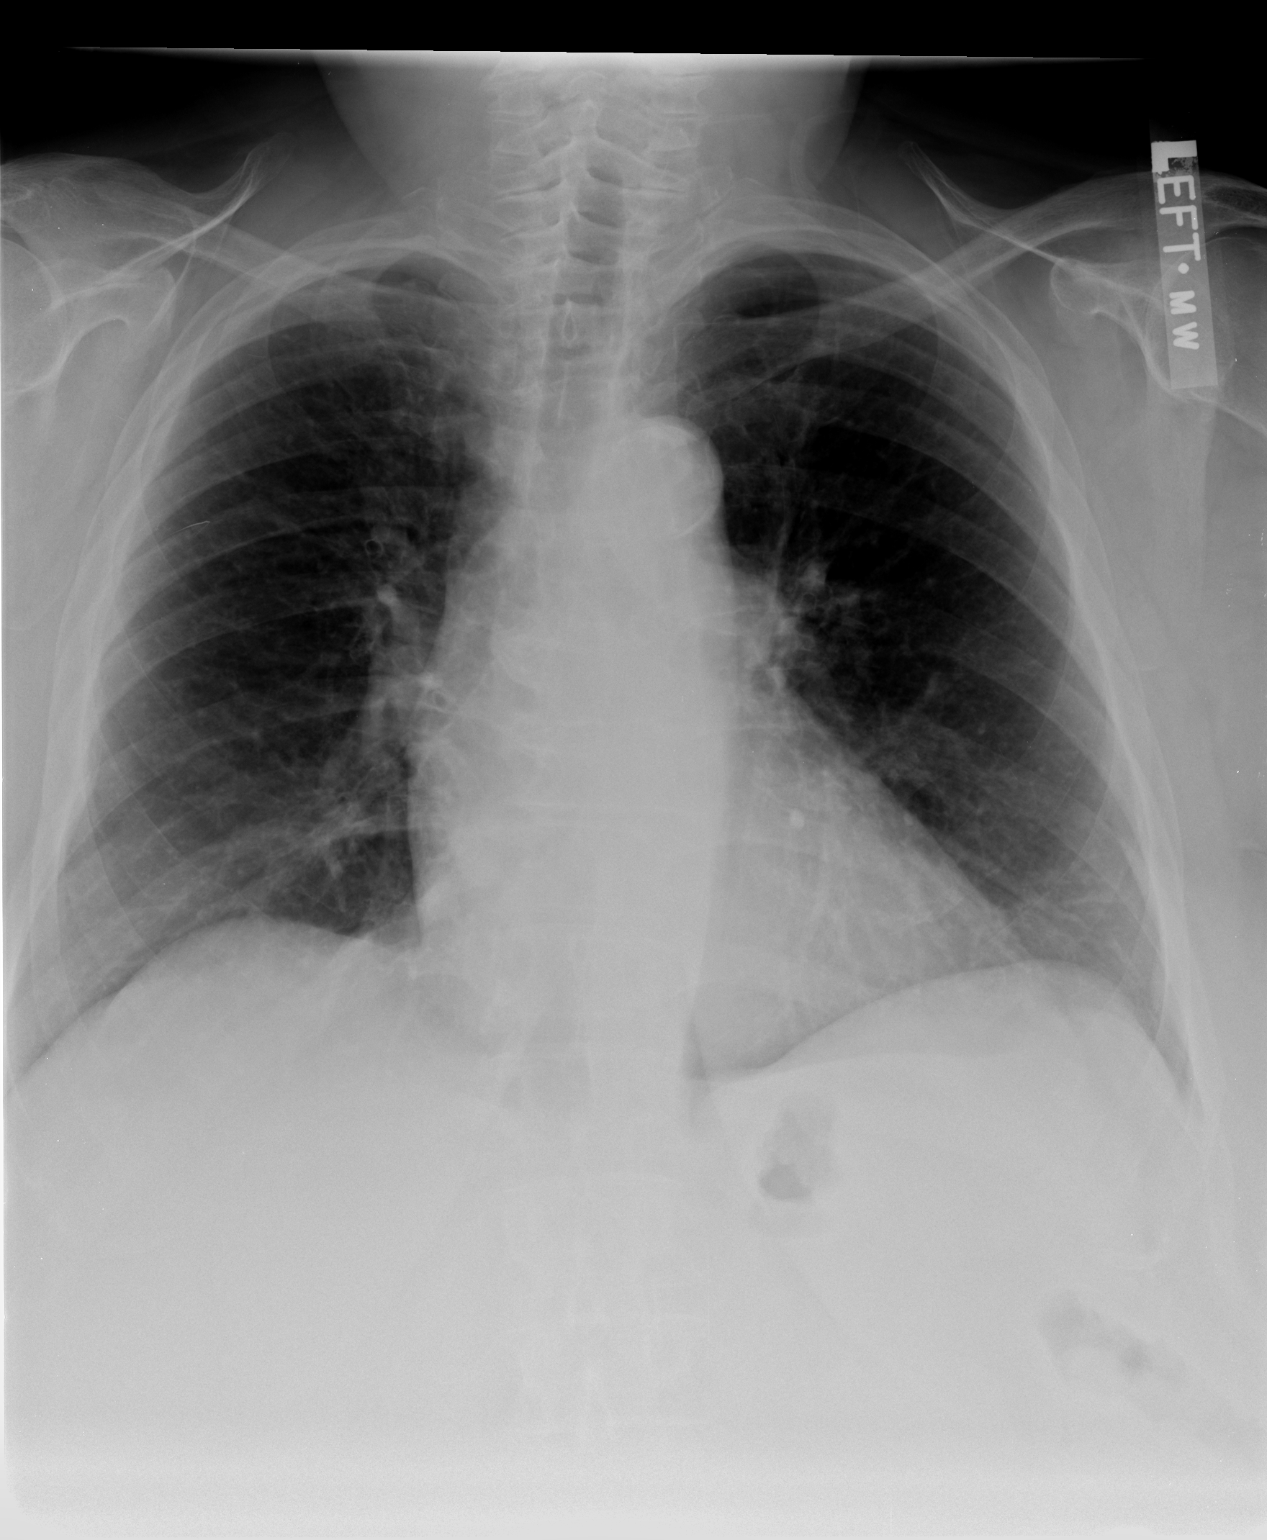

[view not recorded (2 of 2)]
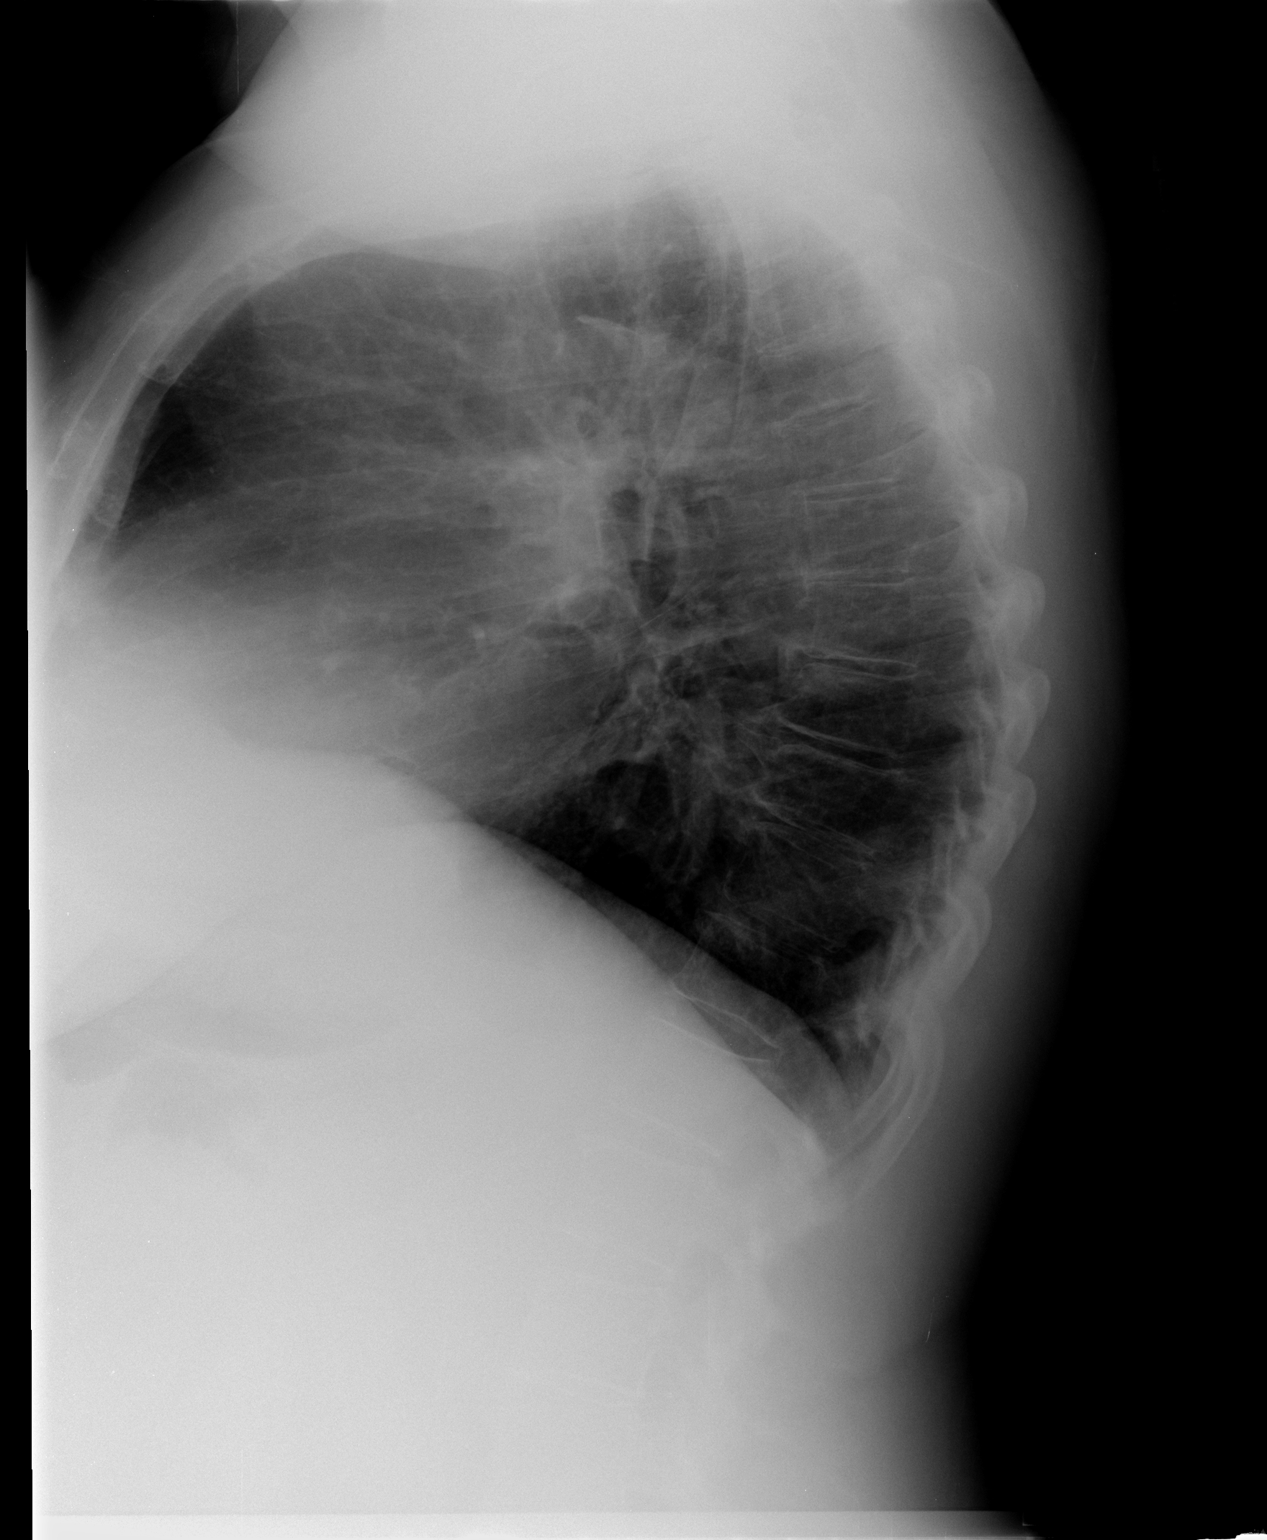

[2 of 2 positions shown; findings below may reference images not displayed]

FINDINGS: Mild hyperinflation, as evidenced by increased A - P
diameter of the chest on the lateral view. Midline trachea.  Mild
cardiomegaly with moderate aortic atherosclerosis. No pleural
effusion or pneumothorax.  No congestive failure.
IMPRESSION: Cardiomegaly and hyperinflation. No acute findings.

## 2010-12-18 ENCOUNTER — Ambulatory Visit
Admission: RE | Admit: 2010-12-18 | Discharge: 2010-12-18 | Payer: Self-pay | Source: Home / Self Care | Attending: Internal Medicine | Admitting: Internal Medicine

## 2010-12-18 ENCOUNTER — Encounter: Payer: Self-pay | Admitting: Internal Medicine

## 2010-12-18 DIAGNOSIS — I10 Essential (primary) hypertension: Secondary | ICD-10-CM | POA: Insufficient documentation

## 2010-12-18 DIAGNOSIS — E785 Hyperlipidemia, unspecified: Secondary | ICD-10-CM | POA: Insufficient documentation

## 2010-12-18 DIAGNOSIS — R0602 Shortness of breath: Secondary | ICD-10-CM | POA: Insufficient documentation

## 2010-12-21 ENCOUNTER — Encounter (HOSPITAL_COMMUNITY)
Admission: RE | Admit: 2010-12-21 | Discharge: 2010-12-29 | Payer: Self-pay | Source: Home / Self Care | Attending: Internal Medicine | Admitting: Internal Medicine

## 2010-12-21 ENCOUNTER — Ambulatory Visit: Admission: RE | Admit: 2010-12-21 | Discharge: 2010-12-21 | Payer: Self-pay | Source: Home / Self Care

## 2010-12-21 ENCOUNTER — Telehealth (INDEPENDENT_AMBULATORY_CARE_PROVIDER_SITE_OTHER): Payer: Self-pay | Admitting: Radiology

## 2010-12-21 LAB — COMPREHENSIVE METABOLIC PANEL
ALT: 24 U/L (ref 0–35)
AST: 25 U/L (ref 0–37)
Albumin: 3.7 g/dL (ref 3.5–5.2)
Alkaline Phosphatase: 43 U/L (ref 39–117)
BUN: 11 mg/dL (ref 6–23)
CO2: 23 mEq/L (ref 19–32)
Calcium: 8.9 mg/dL (ref 8.4–10.5)
Chloride: 102 mEq/L (ref 96–112)
Creatinine, Ser: 0.94 mg/dL (ref 0.4–1.2)
GFR calc Af Amer: >60 mL/min (ref >60)
GFR calc non Af Amer: 60 mL/min — ABNORMAL LOW (ref >60)
Glucose, Bld: 198 mg/dL — ABNORMAL HIGH (ref 70–99)
Potassium: 4.2 mEq/L (ref 3.5–5.1)
Sodium: 135 mEq/L (ref 135–145)
Total Bilirubin: 0.4 mg/dL (ref 0.3–1.2)
Total Protein: 6 g/dL (ref 6.0–8.3)

## 2010-12-21 LAB — DIFFERENTIAL
Basophils Absolute: 0 10*3/uL (ref 0.0–0.1)
Basophils Relative: 1 % (ref 0–1)
Eosinophils Absolute: 0.3 10*3/uL (ref 0.0–0.7)
Eosinophils Relative: 6 % — ABNORMAL HIGH (ref 0–5)
Lymphocytes Relative: 36 % (ref 12–46)
Lymphs Abs: 2.1 10*3/uL (ref 0.7–4.0)
Monocytes Absolute: 0.5 10*3/uL (ref 0.1–1.0)
Monocytes Relative: 8 % (ref 3–12)
Neutro Abs: 2.9 10*3/uL (ref 1.7–7.7)
Neutrophils Relative %: 49 % (ref 43–77)

## 2010-12-21 LAB — SURGICAL PCR SCREEN
MRSA, PCR: NEGATIVE
Staphylococcus aureus: NEGATIVE

## 2010-12-21 LAB — ABO/RH: ABO/RH(D): A NEG

## 2010-12-21 LAB — PROTIME-INR
INR: 0.99 (ref 0.00–1.49)
Prothrombin Time: 13.3 seconds (ref 11.6–15.2)

## 2010-12-21 LAB — CEA: CEA: <0.5 ng/mL (ref 0.0–5.0)

## 2010-12-21 LAB — CBC
HCT: 28.2 % — ABNORMAL LOW (ref 36.0–46.0)
Hemoglobin: 8.6 g/dL — ABNORMAL LOW (ref 12.0–15.0)
MCH: 24 pg — ABNORMAL LOW (ref 26.0–34.0)
MCHC: 30.5 g/dL (ref 30.0–36.0)
MCV: 78.8 fL (ref 78.0–100.0)
Platelets: 345 10*3/uL (ref 150–400)
RBC: 3.58 MIL/uL — ABNORMAL LOW (ref 3.87–5.11)
RDW: 13.5 % (ref 11.5–15.5)
WBC: 5.8 10*3/uL (ref 4.0–10.5)

## 2010-12-21 LAB — APTT: aPTT: 25 seconds (ref 24–37)

## 2010-12-22 ENCOUNTER — Encounter: Payer: Self-pay | Admitting: Cardiovascular Disease

## 2010-12-22 ENCOUNTER — Ambulatory Visit: Admission: RE | Admit: 2010-12-22 | Discharge: 2010-12-22 | Payer: Self-pay | Source: Home / Self Care

## 2010-12-24 LAB — TYPE AND SCREEN
ABO/RH(D): A NEG
Antibody Screen: NEGATIVE
Unit division: 0
Unit division: 0

## 2010-12-29 NOTE — Assessment & Plan Note (Signed)
Summary: 3 months/apc   Visit Type:  Follow-up  Chief Complaint:  follow up.  History of Present Illness: Current Problems:  OBSTRUCTIVE SLEEP APNEA (ICD-327.23)  Can't bring herself to sleep with CPAP regularly. CPAP 11 through [facility]. She's up to bathroom every 2 hours, which makes it hard to keep taking mask off and on. She says it is otherwise comfortable enough.. Her  primary care office is to see her and discuss urology referral. Nocturia very difficult with cpap .Lies awake in bed. Takes Sominex but can't sleep. 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     Current Allergies (reviewed today): ! CODEINE   Social History:    Reviewed history and no changes required:       Patient states former smoker.    Risk Factors:  Tobacco use:  quit    Year quit:  1990    Pack-years:  10  2 packs    Review of Systems      See HPI   Vital Signs:  Patient Profile:   65 Years Old Female Weight:      289.25 pounds O2 Sat:      94 % O2 treatment:    Room Air Pulse rate:   93 / minute BP sitting:   124 / 70  (left arm) Cuff size:   large  Vitals Entered By: Darra Lis RMA (January 12, 2008 1:41 PM)             Is Patient Diabetic? Yes  Comments Medications reviewed with patient ..................................................................Marland KitchenDarra Lis RMA  January 12, 2008 1:45 PM      Physical Exam  General:     obese.   Head:     normocephalic and atraumatic Eyes:     PERRLA/EOM intact; conjunctiva and sclera clear Nose:     no deformity, discharge, inflammation, or lesions Neck:     no masses, thyromegaly, or abnormal cervical nodes Lungs:     clear bilaterally to auscultation and percussion Heart:     regular rate and rhythm, S1, S2 without murmurs, rubs, gallops, or clicks     Impression & Recommendations:  Problem # 1:  OBSTRUCTIVE SLEEP APNEA (ICD-327.23) Assessment: Unchanged noncompliant with  cpap, partly because of bladder, but not motivated. Discussed sleep hygiene try melatonin to help reset sleep schedule Orders: Est. Patient Level II (16109)    Patient Instructions: 1)  Please schedule a follow-up appointment in 6 months. 2)  Try melatonin for sleep- you will probably find it in 3 mg pills to take one every night an hour before bedtime.    ]

## 2010-12-31 NOTE — Assessment & Plan Note (Signed)
Summary: Cardiology Nuclear Testing  Nuclear Med Background Indications for Stress Test: Evaluation for Ischemia, Surgical Clearance  Indications Comments: Pending colon surgery (ca.) -Almond Lint   History Comments: OSA  Symptoms: DOE, Fatigue, Fatigue with Exertion, Light-Headedness, Nausea, Near Syncope, Vomiting    Nuclear Pre-Procedure Cardiac Risk Factors: History of Smoking, Hypertension, Lipids, NIDDM Caffeine/Decaff Intake: None NPO After: 8:00 AM IV 0.9% NS with Angio Cath: 20g     IV Site: R Wrist IV Started by: Irean Hong, RN Chest Size (in) 38     Cup Size D     Height (in): 62 Weight (lb): 260 BMI: 47.73  Nuclear Med Study 1 or 2 day study:  2 day     Stress Test Type:  Lexiscan Reading MD:  Charlton Haws, MD     Referring MD:  Lovina Reach Resting Radionuclide:  Technetium 102m Tetrofosmin     Resting Radionuclide Dose:  33 mCi  Stress Radionuclide:  Technetium 85m Tetrofosmin     Stress Radionuclide Dose:  33 mCi   Stress Protocol      Max HR:  118 bpm Max Systolic BP: 175 mm HgRate Pressure Product:  16109  Lexiscan: 0.4 mg   Stress Test Technologist:  Irean Hong,  RN     Nuclear Technologist:  Doyne Keel, CNMT  Rest Procedure  Myocardial perfusion imaging was performed at rest 45 minutes following the intravenous administration of Technetium 54m Tetrofosmin.  Stress Procedure  The patient received IV Lexiscan 0.4 mg over 15-seconds.  Technetium 46m Tetrofosmin injected at 30-seconds.  There were no significant changes with lexiscan. The patient had nausea and dry heaves after lexiscan which subsided in recovery.  Quantitative spect images were obtained after a 45 minute delay.  QPS Raw Data Images:  Normal; no motion artifact; normal heart/lung ratio. Stress Images:  Decrease inferobase Rest Images:  Normal homogeneous uptake in all areas of the myocardium. Subtraction (SDS):  SDS 3 Transient Ischemic Dilatation:  .90  (Normal <1.22)  Lung/Heart  Ratio:  .26  (Normal <0.45)  Quantitative Gated Spect Images QGS EDV:  76 ml QGS ESV:  20 ml QGS EF:  73 %  Findings Low risk nuclear study Evidence for inferior ischemia      Overall Impression  Exercise Capacity: Lexiscan with no exercise. BP Response: Normal blood pressure response. Clinical Symptoms: Dyspnea ECG Impression: No significant ST segment change suggestive of ischemia. Overall Impression: Thinning of the inferior wall with a suggestio of mild inferobasal wall ischemia  Appended Document: Cardiology Nuclear Testing Low risk myoview scan.  A suggestion of mild basal inferior ischemia.  I would not recomm further testing prior to upcoming surgery.  She is at low risk for major cardiac event and OK to proceed.  Appended Document: Cardiology Nuclear Testing Called patient and she is aware of results.

## 2010-12-31 NOTE — Progress Notes (Signed)
Summary: nuc pre-procedure  Phone Note Outgoing Call   Call placed by: Domenic Polite, CNMT,  December 21, 2010 3:47 PM Call placed to: Patient Reason for Call: Confirm/change Appt Summary of Call: Reviewed information on Myoview Information Sheet (see scanned document for further details).  Spoke with patient.       Nuclear Med Background Indications for Stress Test: Evaluation for Ischemia, Surgical Clearance  Indications Comments: Pending colon surgery (ca.) -Almond Lint   History Comments: OSA  Symptoms: DOE    Nuclear Pre-Procedure Cardiac Risk Factors: History of Smoking, Hypertension, Lipids, NIDDM Height (in): 62

## 2010-12-31 NOTE — Assessment & Plan Note (Signed)
Summary: np6. surgical clearance lap vs open.partial colectomy, risk f...   Visit Type:  Initial Consult  CC:   shob.  History of Present Illness: Patient is a 65 year old who was referred for preop risk stratification  Being evaluated for colong surgey, has colon cancer She has no known history of CAD.  She does say over the past year she has noticed she is more SOB with activity.  She gives out walking across her trailor.  She denies chest pains.   She is not that active.    Problems Prior to Update: 1)  Shortness of Breath  (ICD-786.05) 2)  Obstructive Sleep Apnea  (ICD-327.23)  Current Medications (verified): 1)  Quinapril Hcl 40 Mg  Tabs (Quinapril Hcl) .... Take 1 Tablet By Mouth Once A Day 2)  Glipizide Xl 10 Mg  Tb24 (Glipizide) .... Take 1 Tablet By Mouth Once A Day 3)  Avandamet 02-999 Mg  Tabs (Rosiglitazone-Metformin) .... Take One Tablet By Mouth Two Times A Day 4)  Famotidine 40 Mg  Tabs (Famotidine) .... Take One Tablet By Mouth Two Times A Day 5)  Simvastatin 80 Mg  Tabs (Simvastatin) .... Take 1 Tablet By Mouth Once A Day 6)  Hydrochlorothiazide 25 Mg  Tabs (Hydrochlorothiazide) .... Take 1 Tablet By Mouth Once A Day 7)  Cpap .Marland Kitchen.. 11  Allergies: 1)  ! Codeine 2)  ! Roxicet  Past History:  Past Medical History: DM x 20 years HTN Dyslipidemia  Past Surgical History: TAH Hip replasement Wrist surgery  Family History: Mother died 77:  hx DM , CVs Father:  died 47  Hx COPD 7 sisters  2 brothers.  Social History: Patient states former smoker.  No caffeine  Review of Systems       All systems reviewed.  Neg to the above problem except as noted above  Vital Signs:  Patient profile:   65 year old female Height:      62 inches Weight:      264.50 pounds BMI:     48.55 Pulse rate:   82 / minute BP sitting:   126 / 68  (left arm) Cuff size:   large  Vitals Entered By: Micki Riley CNA (December 18, 2010 3:29 PM)  Physical Exam  Additional  Exam:  Pateint is in NAD HEENT:  Normocephalic, atraumatic. EOMI, PERRLA.  Neck: JVP is normal. No thyromegaly. No bruits.  Lungs: clear to auscultation. No rales no wheezes.  Heart: Regular rate and rhythm. Normal S1, S2. No S3.   No significant murmurs. PMI not displaced.  Abdomen:  Supple, nontender. Normal bowel sounds. No masses. No hepatomegaly.  Extremities:   Good distal pulses throughout. No lower extremity edema.  Musculoskeletal :moving all extremities.  Neuro:   alert and oriented x3.    EKG  Procedure date:  12/18/2010  Findings:      NSR.  82 bpm  Impression & Recommendations:  Problem # 1:  SHORTNESS OF BREATH (ICD-786.05) Concerning.  Patient with multiple risk factors for CAD.  May represent an anginal equivalent. I would recommend a Lexiscan myoview to evaluate  Problem # 2:  HYPERLIPIDEMIA-MIXED (ICD-272.4) Will need to be followed.   Her updated medication list for this problem includes:    Simvastatin 80 Mg Tabs (Simvastatin) .Marland Kitchen... Take 1 tablet by mouth once a day  Problem # 3:  HYPERTENSION, BENIGN (ICD-401.1) Continue meds. Her updated medication list for this problem includes:    Quinapril Hcl 40 Mg Tabs (Quinapril  hcl) ..... Take 1 tablet by mouth once a day    Hydrochlorothiazide 25 Mg Tabs (Hydrochlorothiazide) .Marland Kitchen... Take 1 tablet by mouth once a day  Other Orders: EKG w/ Interpretation (93000) Nuclear Stress Test (Nuc Stress Test)  Patient Instructions: 1)  Your physician has requested that you have an lexiscan myoview.  For further information please visit [email or web address]  Please follow instruction sheet, as given.

## 2011-01-07 ENCOUNTER — Encounter (HOSPITAL_COMMUNITY)
Admission: RE | Admit: 2011-01-07 | Discharge: 2011-01-07 | Disposition: A | Discharge status: Home / Self Care | Payer: Medicare Other | Source: Ambulatory Visit | Attending: General Surgery | Admitting: General Surgery

## 2011-01-07 DIAGNOSIS — Z01812 Encounter for preprocedural laboratory examination: Secondary | ICD-10-CM | POA: Insufficient documentation

## 2011-01-07 LAB — CBC
HCT: 27.5 % — ABNORMAL LOW (ref 36.0–46.0)
Hemoglobin: 8.6 g/dL — ABNORMAL LOW (ref 12.0–15.0)
MCH: 23.7 pg — ABNORMAL LOW (ref 26.0–34.0)
MCHC: 31.3 g/dL (ref 30.0–36.0)
MCV: 75.8 fL — ABNORMAL LOW (ref 78.0–100.0)
Platelets: 381 10*3/uL (ref 150–400)
RBC: 3.63 MIL/uL — ABNORMAL LOW (ref 3.87–5.11)
RDW: 14.3 % (ref 11.5–15.5)
WBC: 6.6 10*3/uL (ref 4.0–10.5)

## 2011-01-07 LAB — URINALYSIS, ROUTINE W REFLEX MICROSCOPIC
Bilirubin Urine: NEGATIVE
Hgb urine dipstick: NEGATIVE
Ketones, ur: NEGATIVE mg/dL
Nitrite: NEGATIVE
Protein, ur: NEGATIVE mg/dL
Specific Gravity, Urine: 1.017 (ref 1.005–1.030)
Urine Glucose, Fasting: NEGATIVE mg/dL
Urobilinogen, UA: 0.2 mg/dL (ref 0.0–1.0)
pH: 5.5 (ref 5.0–8.0)

## 2011-01-07 LAB — COMPREHENSIVE METABOLIC PANEL
ALT: 23 U/L (ref 0–35)
AST: 27 U/L (ref 0–37)
Albumin: 3.7 g/dL (ref 3.5–5.2)
Alkaline Phosphatase: 39 U/L (ref 39–117)
BUN: 8 mg/dL (ref 6–23)
CO2: 22 mEq/L (ref 19–32)
Calcium: 9.2 mg/dL (ref 8.4–10.5)
Chloride: 102 mEq/L (ref 96–112)
Creatinine, Ser: 0.87 mg/dL (ref 0.4–1.2)
GFR calc Af Amer: >60 mL/min (ref >60)
GFR calc non Af Amer: >60 mL/min (ref >60)
Glucose, Bld: 184 mg/dL — ABNORMAL HIGH (ref 70–99)
Potassium: 4.2 mEq/L (ref 3.5–5.1)
Sodium: 136 mEq/L (ref 135–145)
Total Bilirubin: 0.4 mg/dL (ref 0.3–1.2)
Total Protein: 6.3 g/dL (ref 6.0–8.3)

## 2011-01-07 LAB — TYPE AND SCREEN
ABO/RH(D): A NEG
Antibody Screen: NEGATIVE

## 2011-01-07 LAB — PROTIME-INR
INR: 1.03 (ref 0.00–1.49)
Prothrombin Time: 13.7 seconds (ref 11.6–15.2)

## 2011-01-07 LAB — DIFFERENTIAL
Basophils Absolute: 0 10*3/uL (ref 0.0–0.1)
Basophils Relative: 1 % (ref 0–1)
Eosinophils Absolute: 0.3 10*3/uL (ref 0.0–0.7)
Eosinophils Relative: 4 % (ref 0–5)
Lymphocytes Relative: 25 % (ref 12–46)
Lymphs Abs: 1.7 10*3/uL (ref 0.7–4.0)
Monocytes Absolute: 0.5 10*3/uL (ref 0.1–1.0)
Monocytes Relative: 7 % (ref 3–12)
Neutro Abs: 4.1 10*3/uL (ref 1.7–7.7)
Neutrophils Relative %: 63 % (ref 43–77)

## 2011-01-07 LAB — APTT: aPTT: 25 seconds (ref 24–37)

## 2011-01-11 ENCOUNTER — Other Ambulatory Visit: Payer: Self-pay | Admitting: General Surgery

## 2011-01-11 ENCOUNTER — Inpatient Hospital Stay (HOSPITAL_COMMUNITY)
Admission: RE | Admit: 2011-01-11 | Discharge: 2011-01-15 | DRG: 331 | Disposition: A | Discharge status: Home / Self Care | Payer: Medicare Other | Source: Ambulatory Visit | Attending: General Surgery | Admitting: General Surgery

## 2011-01-11 DIAGNOSIS — K219 Gastro-esophageal reflux disease without esophagitis: Secondary | ICD-10-CM | POA: Diagnosis present

## 2011-01-11 DIAGNOSIS — D649 Anemia, unspecified: Secondary | ICD-10-CM | POA: Diagnosis present

## 2011-01-11 DIAGNOSIS — G4733 Obstructive sleep apnea (adult) (pediatric): Secondary | ICD-10-CM | POA: Diagnosis present

## 2011-01-11 DIAGNOSIS — Z01812 Encounter for preprocedural laboratory examination: Secondary | ICD-10-CM

## 2011-01-11 DIAGNOSIS — E119 Type 2 diabetes mellitus without complications: Secondary | ICD-10-CM | POA: Diagnosis present

## 2011-01-11 DIAGNOSIS — E7800 Pure hypercholesterolemia, unspecified: Secondary | ICD-10-CM | POA: Diagnosis present

## 2011-01-11 DIAGNOSIS — C189 Malignant neoplasm of colon, unspecified: Principal | ICD-10-CM | POA: Diagnosis present

## 2011-01-11 DIAGNOSIS — I10 Essential (primary) hypertension: Secondary | ICD-10-CM | POA: Diagnosis present

## 2011-01-11 LAB — GLUCOSE, CAPILLARY
Glucose-Capillary: 219 mg/dL — ABNORMAL HIGH (ref 70–99)
Glucose-Capillary: 245 mg/dL — ABNORMAL HIGH (ref 70–99)

## 2011-01-12 LAB — BASIC METABOLIC PANEL
BUN: 10 mg/dL (ref 6–23)
CO2: 23 mEq/L (ref 19–32)
Calcium: 9 mg/dL (ref 8.4–10.5)
Chloride: 103 mEq/L (ref 96–112)
Creatinine, Ser: 0.8 mg/dL (ref 0.4–1.2)
GFR calc Af Amer: >60 mL/min (ref >60)
GFR calc non Af Amer: >60 mL/min (ref >60)
Glucose, Bld: 137 mg/dL — ABNORMAL HIGH (ref 70–99)
Potassium: 3.5 mEq/L (ref 3.5–5.1)
Sodium: 138 mEq/L (ref 135–145)

## 2011-01-12 LAB — PHOSPHORUS: Phosphorus: 1.8 mg/dL — ABNORMAL LOW (ref 2.3–4.6)

## 2011-01-12 LAB — GLUCOSE, CAPILLARY
Glucose-Capillary: 147 mg/dL — ABNORMAL HIGH (ref 70–99)
Glucose-Capillary: 166 mg/dL — ABNORMAL HIGH (ref 70–99)
Glucose-Capillary: 179 mg/dL — ABNORMAL HIGH (ref 70–99)
Glucose-Capillary: 205 mg/dL — ABNORMAL HIGH (ref 70–99)
Glucose-Capillary: 183 mg/dL — ABNORMAL HIGH (ref 70–99)

## 2011-01-12 LAB — CBC
HCT: 23.7 % — ABNORMAL LOW (ref 36.0–46.0)
Hemoglobin: 7.5 g/dL — ABNORMAL LOW (ref 12.0–15.0)
MCH: 23.1 pg — ABNORMAL LOW (ref 26.0–34.0)
MCHC: 31.6 g/dL (ref 30.0–36.0)
MCV: 73.1 fL — ABNORMAL LOW (ref 78.0–100.0)
Platelets: 351 10*3/uL (ref 150–400)
RBC: 3.24 MIL/uL — ABNORMAL LOW (ref 3.87–5.11)
RDW: 14.5 % (ref 11.5–15.5)
WBC: 8.3 10*3/uL (ref 4.0–10.5)

## 2011-01-12 LAB — HEMOGLOBIN A1C
Hgb A1c MFr Bld: 8.4 % — ABNORMAL HIGH (ref <5.7)
Mean Plasma Glucose: 194 mg/dL — ABNORMAL HIGH (ref <117)

## 2011-01-12 LAB — MAGNESIUM: Magnesium: 1.5 mg/dL (ref 1.5–2.5)

## 2011-01-13 LAB — BASIC METABOLIC PANEL
BUN: 6 mg/dL (ref 6–23)
CO2: 25 mEq/L (ref 19–32)
Calcium: 8.8 mg/dL (ref 8.4–10.5)
Chloride: 101 mEq/L (ref 96–112)
Creatinine, Ser: 0.88 mg/dL (ref 0.4–1.2)
GFR calc Af Amer: >60 mL/min (ref >60)
GFR calc non Af Amer: >60 mL/min (ref >60)
Glucose, Bld: 135 mg/dL — ABNORMAL HIGH (ref 70–99)
Potassium: 3.9 mEq/L (ref 3.5–5.1)
Sodium: 136 mEq/L (ref 135–145)

## 2011-01-13 LAB — GLUCOSE, CAPILLARY
Glucose-Capillary: 139 mg/dL — ABNORMAL HIGH (ref 70–99)
Glucose-Capillary: 164 mg/dL — ABNORMAL HIGH (ref 70–99)
Glucose-Capillary: 163 mg/dL — ABNORMAL HIGH (ref 70–99)
Glucose-Capillary: 167 mg/dL — ABNORMAL HIGH (ref 70–99)
Glucose-Capillary: 192 mg/dL — ABNORMAL HIGH (ref 70–99)
Glucose-Capillary: 135 mg/dL — ABNORMAL HIGH (ref 70–99)
Glucose-Capillary: 145 mg/dL — ABNORMAL HIGH (ref 70–99)

## 2011-01-13 LAB — CBC
HCT: 25.6 % — ABNORMAL LOW (ref 36.0–46.0)
Hemoglobin: 7.9 g/dL — ABNORMAL LOW (ref 12.0–15.0)
MCH: 23 pg — ABNORMAL LOW (ref 26.0–34.0)
MCHC: 30.9 g/dL (ref 30.0–36.0)
MCV: 74.4 fL — ABNORMAL LOW (ref 78.0–100.0)
Platelets: 377 10*3/uL (ref 150–400)
RBC: 3.44 MIL/uL — ABNORMAL LOW (ref 3.87–5.11)
RDW: 14.8 % (ref 11.5–15.5)
WBC: 8.6 10*3/uL (ref 4.0–10.5)

## 2011-01-13 LAB — PHOSPHORUS: Phosphorus: 3.6 mg/dL (ref 2.3–4.6)

## 2011-01-13 LAB — MAGNESIUM: Magnesium: 2.1 mg/dL (ref 1.5–2.5)

## 2011-01-14 LAB — GLUCOSE, CAPILLARY
Glucose-Capillary: 178 mg/dL — ABNORMAL HIGH (ref 70–99)
Glucose-Capillary: 173 mg/dL — ABNORMAL HIGH (ref 70–99)
Glucose-Capillary: 138 mg/dL — ABNORMAL HIGH (ref 70–99)
Glucose-Capillary: 153 mg/dL — ABNORMAL HIGH (ref 70–99)

## 2011-01-14 LAB — BASIC METABOLIC PANEL
BUN: 5 mg/dL — ABNORMAL LOW (ref 6–23)
CO2: 25 mEq/L (ref 19–32)
Calcium: 8.7 mg/dL (ref 8.4–10.5)
Chloride: 104 mEq/L (ref 96–112)
Creatinine, Ser: 0.76 mg/dL (ref 0.4–1.2)
GFR calc Af Amer: >60 mL/min (ref >60)
GFR calc non Af Amer: >60 mL/min (ref >60)
Glucose, Bld: 180 mg/dL — ABNORMAL HIGH (ref 70–99)
Potassium: 4 mEq/L (ref 3.5–5.1)
Sodium: 137 mEq/L (ref 135–145)

## 2011-01-14 LAB — CBC
HCT: 22.3 % — ABNORMAL LOW (ref 36.0–46.0)
Hemoglobin: 7 g/dL — ABNORMAL LOW (ref 12.0–15.0)
MCH: 23.3 pg — ABNORMAL LOW (ref 26.0–34.0)
MCHC: 31.4 g/dL (ref 30.0–36.0)
MCV: 74.1 fL — ABNORMAL LOW (ref 78.0–100.0)
Platelets: 290 10*3/uL (ref 150–400)
RBC: 3.01 MIL/uL — ABNORMAL LOW (ref 3.87–5.11)
RDW: 14.7 % (ref 11.5–15.5)
WBC: 5.6 10*3/uL (ref 4.0–10.5)

## 2011-01-15 LAB — CBC
HCT: 22.1 % — ABNORMAL LOW (ref 36.0–46.0)
Hemoglobin: 7 g/dL — ABNORMAL LOW (ref 12.0–15.0)
MCH: 23.3 pg — ABNORMAL LOW (ref 26.0–34.0)
MCHC: 31.7 g/dL (ref 30.0–36.0)
MCV: 73.4 fL — ABNORMAL LOW (ref 78.0–100.0)
Platelets: 325 10*3/uL (ref 150–400)
RBC: 3.01 MIL/uL — ABNORMAL LOW (ref 3.87–5.11)
RDW: 14.9 % (ref 11.5–15.5)
WBC: 5.1 10*3/uL (ref 4.0–10.5)

## 2011-01-15 LAB — GLUCOSE, CAPILLARY: Glucose-Capillary: 166 mg/dL — ABNORMAL HIGH (ref 70–99)

## 2011-01-17 NOTE — Op Note (Signed)
NAMESATSUKI, ZILLMER             ACCOUNT NO.:  [id number]  MEDICAL RECORD NO.:  [id number]           PATIENT TYPE:  I  LOCATION:  5157                         FACILITY:  [facility]  PHYSICIAN:  Almond Lint, MD       DATE OF BIRTH:  January 02, 1946  DATE OF PROCEDURE:  01/11/2011 DATE OF DISCHARGE:                              OPERATIVE REPORT   PREOPERATIVE DIAGNOSIS:  Clinical T2 N1 Mx colon cancer.  POSTOPERATIVE DIAGNOSIS:  Clinical T2 N1 Mx colon cancer.  PROCEDURE:  Laparoscopic right colectomy and On-Q pain pump placement.  SURGEON:  Almond Lint, MD.  ASSISTANT SURGEON:  Darnell Level, MD.  ANESTHESIA:  General and local.  FINDINGS:  Ascending colon mass.  SPECIMEN:  Right colon to pathology.  ESTIMATED BLOOD LOSS:  50 mL.  COMPLICATIONS:  None known.  PROCEDURE:  Ms. Maita was identified in the holding area and taken to the operating room where she was placed supine on the operating room table.  General anesthesia was induced.  Foley catheter was placed, and her left arm was tucked.  Her abdomen was prepped and draped in sterile fashion.  Time-out was performed according to the surgical safety checklist.  When all was correct, we continued.  The infraumbilical skin was anesthetized with local anesthetic and the vertical incision was made approximately 1.5 cm in length.  The Kelly clamp was used to spread the subcutaneous tissues and two Kocher clamps were used to elevate the midline fascia.  This was incised with a #11 blade, and a Tresa Endo was used to confirm entrance into the peritoneal cavity.  A 0 Vicryl pursestring suture was placed around the incision, and the Hasson was introduced into the abdomen and held in place to the abdominal wall with the tails of the suture.  Pneumoperitoneum was achieved to a pressure of 15 mmHg.   A 5-mm port was placed between the umbilicus and the pubis, and an additional was placed in the midline above the umbilicus.  Another  5-mm trocar was placed in the right upper quadrant.  The appendix and cecum were reasonably mobile, and this was mobilized with the harmonic, going up the white line of Toldt.  The hepatic flexure was taken down with the Harmonic as well and the omentum taken off the transverse colon, approximately half way across.  The colon was pulled down, making sure that the attachments to the retroperitoneum were free.    Once it was ascertained that the colon fully was mobilized to reach to the abdomen, approximately 8-10 cm incision was made in the upper abdomen and it was done with a #10 blade.  This encompassed the uppermost midline trocar site.  Cautery was used to take down the subcutaneous tissues and open the fascia in the midline.  The appendix was grasped with a locking grasper and held in place while the fascia was being opened, and then this was pulled out through the fascial incision.  This was definitely mobile enough; 10-cm proximal to the mass was identified, which was the proximal transverse colon.  The GIA 75 was used to fire across this, and a stitch was  used to hold the colon from retracting back into the abdomen.  The SuperJaw was used to divide the mesenteric attachments. The GIA 75 was used to come across the terminal ileum and then the super draw used from this direction as well.  There was one site in the middle where two 2-0 Vicryl suture ligatures were required for hemostasis.  The specimen was then passed off the table.    The two ends of the bowel were then placed together with a stay suture of 3-0 Vicryl and one at the apex.  The bowel was opened with the cautery, and the GIA 75 was used to create a side-to-side functional end-to-end anastomosis of the ileum to the transverse colon.  The staple line was then examined, and there was no evidence of bleeding at the staple line.  The TA-60 was used to complete the anastomosis, and a second apex suture was placed to  cross the anastomosis.  This was allowed to retract back into the abdomen, and then the midline fascia was closed using #1 Novofil pops in an interrupted fashion.  Pneumoperitoneum was reachieved, and the abdomen was examined.  There was no evidence of bleeding or pooling anywhere. There was no evidence of other gross pathology.  The liver was without evidence of metastatic disease.  There were no peritoneal implants seen in any location.  The omentum was pulled over the anastomosis, and the anastomosis appeared viable.  Right upper quadrant and right lateral abdomen were irrigated and aspirated.  Again, a four-quadrant inspection revealed no evidence of pooling of bladder fluid.  The patient tolerated the procedure well.  The pneumoperitoneum was allowed to evacuate, and the pursestring suture at the Hasson trocar site was closed down.  There was no residual or palpable fascial defect.  The skin of all the incisions was closed with staples after the On-Q pain pump was placed. This was done in the preperitoneal space on either side of the open incision.  The catheters were bolused with 2.5 mL of local anesthetic. The patient was awakened from anesthesia and taken to PACU in stable condition.  Needle, sponge, and instrument counts were correct.     Almond Lint, MD     FB/MEDQ  D:  01/11/2011  T:  01/12/2011  Job:  [id number]  Electronically Signed by Almond Lint MD on 01/17/2011 12:40:15 AM

## 2011-01-17 NOTE — Discharge Summary (Signed)
  NAMEPRESLI, [name]             ACCOUNT NO.:  [id number]  MEDICAL RECORD NO.:  [id number]           PATIENT TYPE:  I  LOCATION:  5157                         FACILITY:  [facility]  PHYSICIAN:  Almond Lint, MD       DATE OF BIRTH:  05-26-1946  DATE OF ADMISSION:  01/11/2011 DATE OF DISCHARGE:  01/15/2011                              DISCHARGE SUMMARY   PRINCIPAL DIAGNOSIS:  T3, N0 right colon cancer.  SECONDARY DIAGNOSES: 1. Diabetes. 2. Hypertension. 3. Morbid obesity. 4. Sleep apnea. 5. Gastroesophageal reflux disease. 6. Hypercholesterolemia.  DISCHARGE MEDICATIONS: 1. Unisom at bedtime. 2. Glipizide ER 10 mg twice a day before meals. 3. Quinapril 40 mg once a day every morning. 4. Famotidine 40 mg twice a day. 5. Metformin 1000 mg twice a day. 6. Simvastatin 80 mg once a day. 7. Januvia 100 mg once a day. 8. Hydrocodone/acetaminophen 5/325 one-two tablets p.o. q.4 h p.r.n.     pain.  PRINCIPLE LABORATORIES:  [name] came in very anemic with a hematocrit of 27.5, prior to discharge this was 22.1, but was stable from the day before at 22.3.  Her a creatinine is stable at 0.76.  No hypokalemia is present.  She had 1 episode of hypophosphatemia which was repleted.  Blood sugars were running in the high 100s for the most part.  HOSPITAL COURSE:  [name] was admitted to the hospital following her laparoscopic right hemicolectomy.  The first night, she was very uncomfortable and was quite nauseated.  She had difficulty sleeping because of oxygen saturation monitor.  She was switched over to p.r.n. IV pain medicine in order to avoid being awoken as much by the noise. She was on IV metoprolol for hypertension.  She also was on the indirect protocol for postoperative ileus. She was able to use her CPAP, subsequently while she was here.  She was well getting up and getting around.  She is able to void when her catheter was removed.  She will be discharged to home in  improved condition, tolerating a regular diet.  She is stooling and having flatus.  She will follow up with me on February 27 for staple removal.     Almond Lint, MD     FB/MEDQ  D:  01/15/2011  T:  01/15/2011  Job:  [id number]  Electronically Signed by Almond Lint MD on 01/17/2011 12:40:38 AM

## 2011-01-21 ENCOUNTER — Other Ambulatory Visit: Payer: Self-pay | Admitting: Family Medicine

## 2011-01-21 DIAGNOSIS — Z1231 Encounter for screening mammogram for malignant neoplasm of breast: Secondary | ICD-10-CM

## 2011-02-04 ENCOUNTER — Other Ambulatory Visit: Payer: Self-pay | Admitting: Oncology

## 2011-02-04 ENCOUNTER — Encounter (HOSPITAL_BASED_OUTPATIENT_CLINIC_OR_DEPARTMENT_OTHER): Payer: Medicare Other | Admitting: Oncology

## 2011-02-04 DIAGNOSIS — C182 Malignant neoplasm of ascending colon: Secondary | ICD-10-CM

## 2011-02-04 LAB — CBC WITH DIFFERENTIAL/PLATELET
BASO%: 0.5 % (ref 0.0–2.0)
Basophils Absolute: 0 10*3/uL (ref 0.0–0.1)
EOS%: 4.2 % (ref 0.0–7.0)
Eosinophils Absolute: 0.3 10*3/uL (ref 0.0–0.5)
HCT: 26.4 % — ABNORMAL LOW (ref 34.8–46.6)
HGB: 7.9 g/dL — ABNORMAL LOW (ref 11.6–15.9)
LYMPH%: 36.9 % (ref 14.0–49.7)
MCH: 21.4 pg — ABNORMAL LOW (ref 25.1–34.0)
MCHC: 29.9 g/dL — ABNORMAL LOW (ref 31.5–36.0)
MCV: 71.4 fL — ABNORMAL LOW (ref 79.5–101.0)
MONO#: 0.4 10*3/uL (ref 0.1–0.9)
MONO%: 6.5 % (ref 0.0–14.0)
NEUT#: 3.2 10*3/uL (ref 1.5–6.5)
NEUT%: 51.9 % (ref 38.4–76.8)
Platelets: 427 10*3/uL — ABNORMAL HIGH (ref 145–400)
RBC: 3.7 10*6/uL (ref 3.70–5.45)
RDW: 15.3 % — ABNORMAL HIGH (ref 11.2–14.5)
WBC: 6.2 10*3/uL (ref 3.9–10.3)
lymph#: 2.3 10*3/uL (ref 0.9–3.3)
nRBC: 0 % (ref 0–0)

## 2011-02-08 ENCOUNTER — Ambulatory Visit
Admission: RE | Admit: 2011-02-08 | Discharge: 2011-02-08 | Disposition: A | Discharge status: Home / Self Care | Payer: Medicare Other | Source: Ambulatory Visit | Attending: Family Medicine | Admitting: Family Medicine

## 2011-02-08 DIAGNOSIS — Z1231 Encounter for screening mammogram for malignant neoplasm of breast: Secondary | ICD-10-CM

## 2011-02-09 IMAGING — MG MM DIGITAL SCREENING BILAT W/ CAD
4 series · 4 of 4 positions shown · non-contrast
Comparison: Prior studies.

DG SCREEN MAMMOGRAM BILATERAL
Bilateral CC and MLO view(s) were taken.

DIGITAL SCREENING MAMMOGRAM WITH CAD:

[R CC]
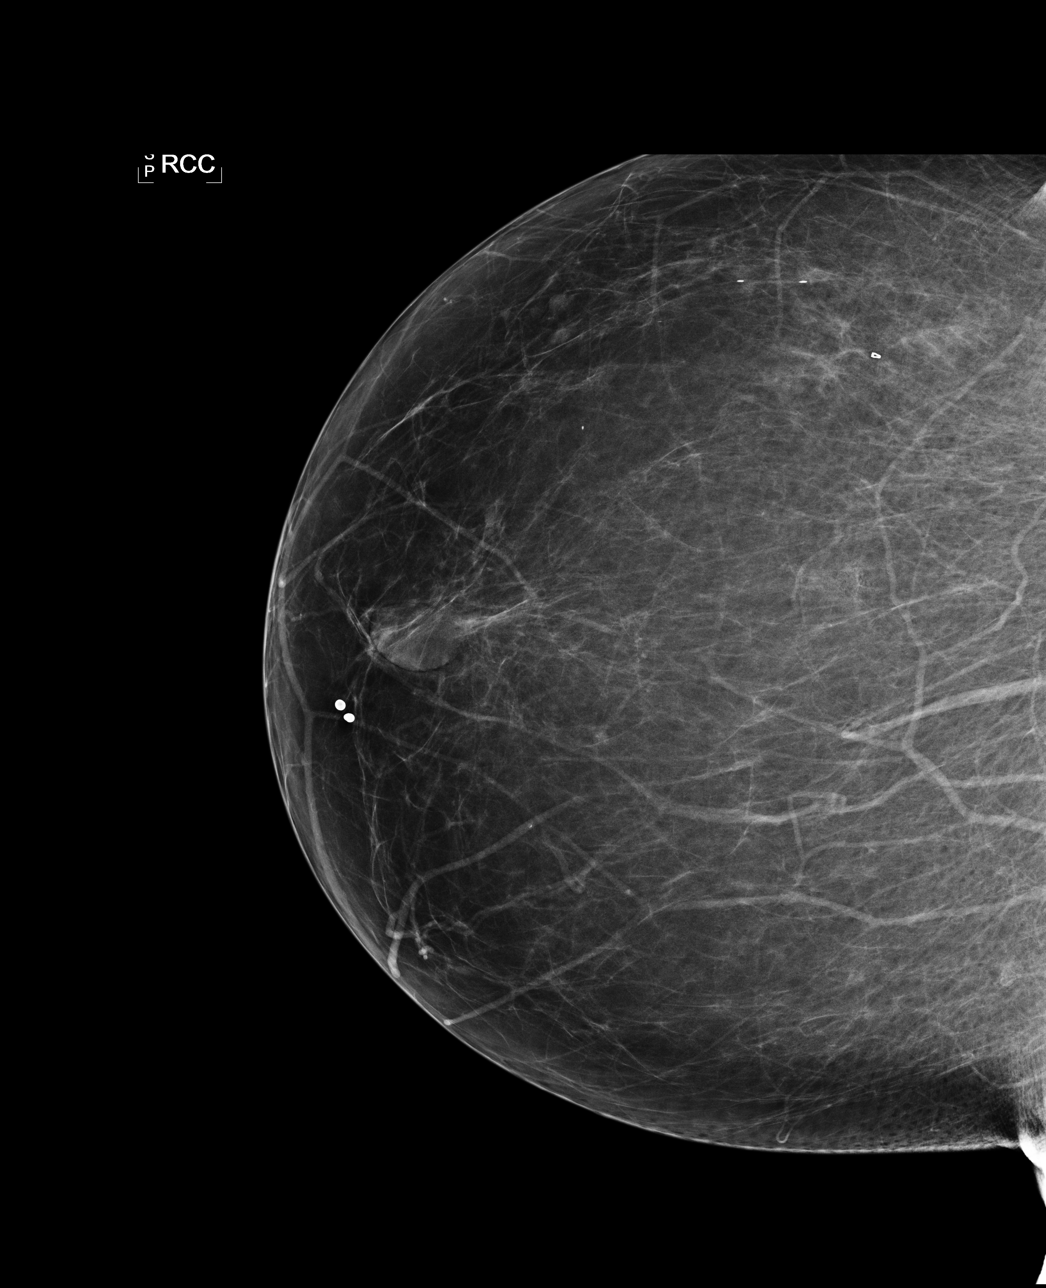

[L CC]
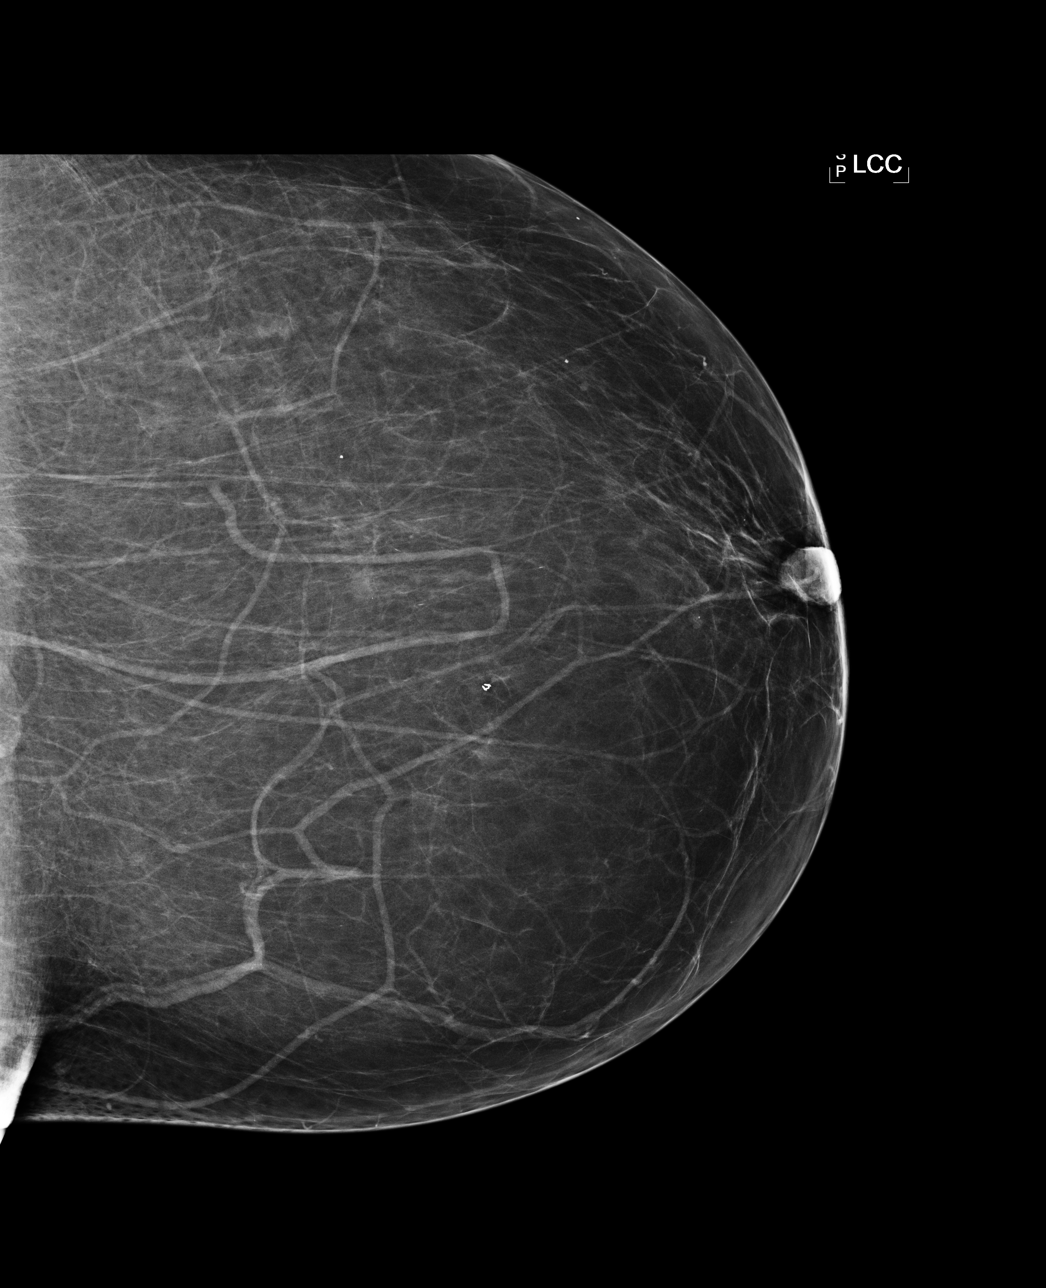

[L MLO]
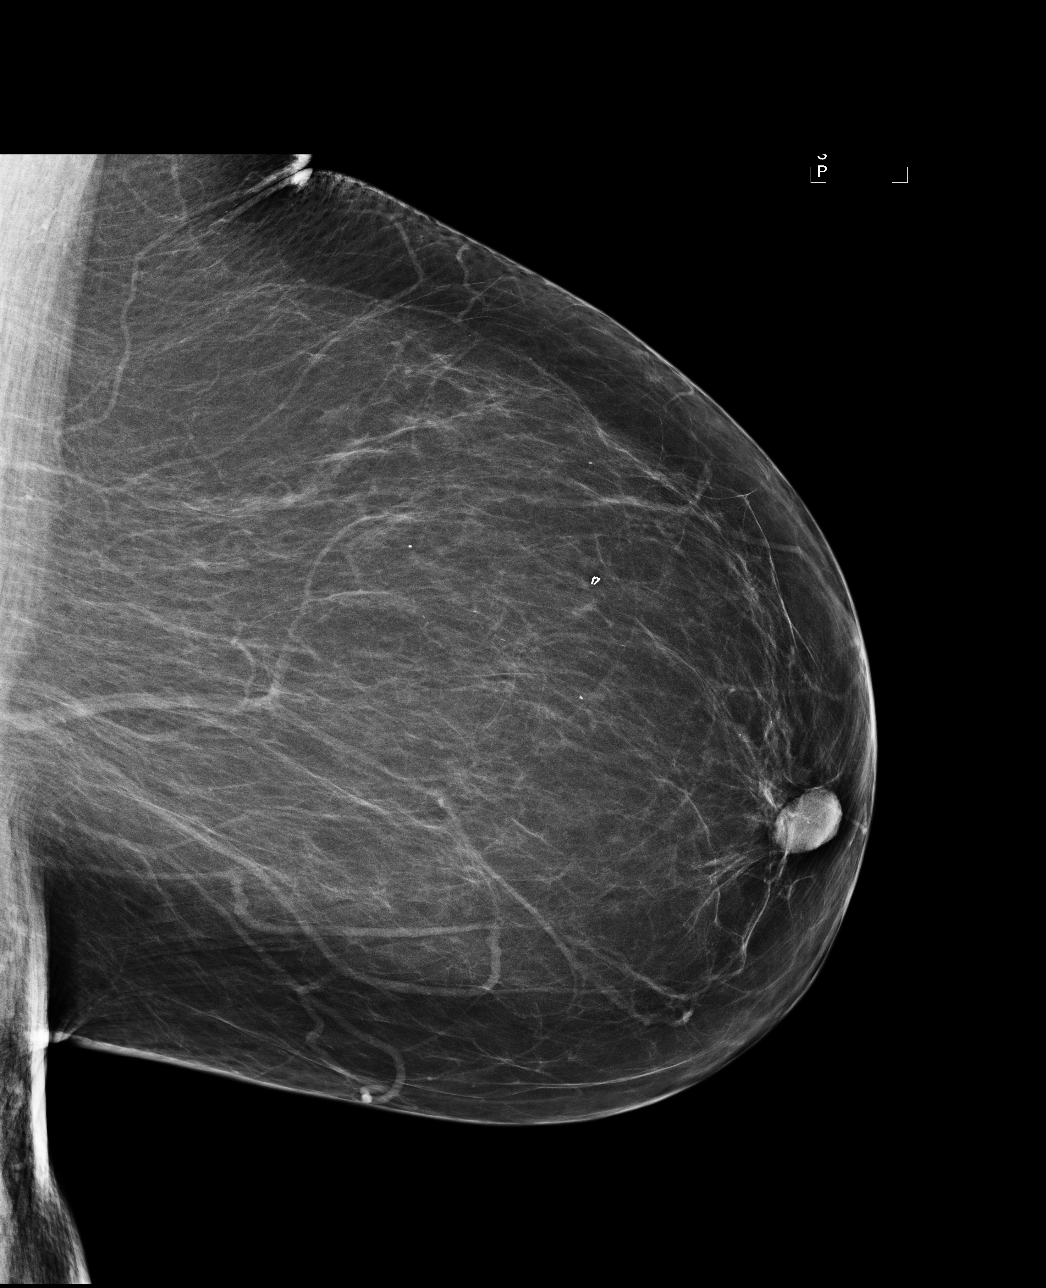

[R MLO]
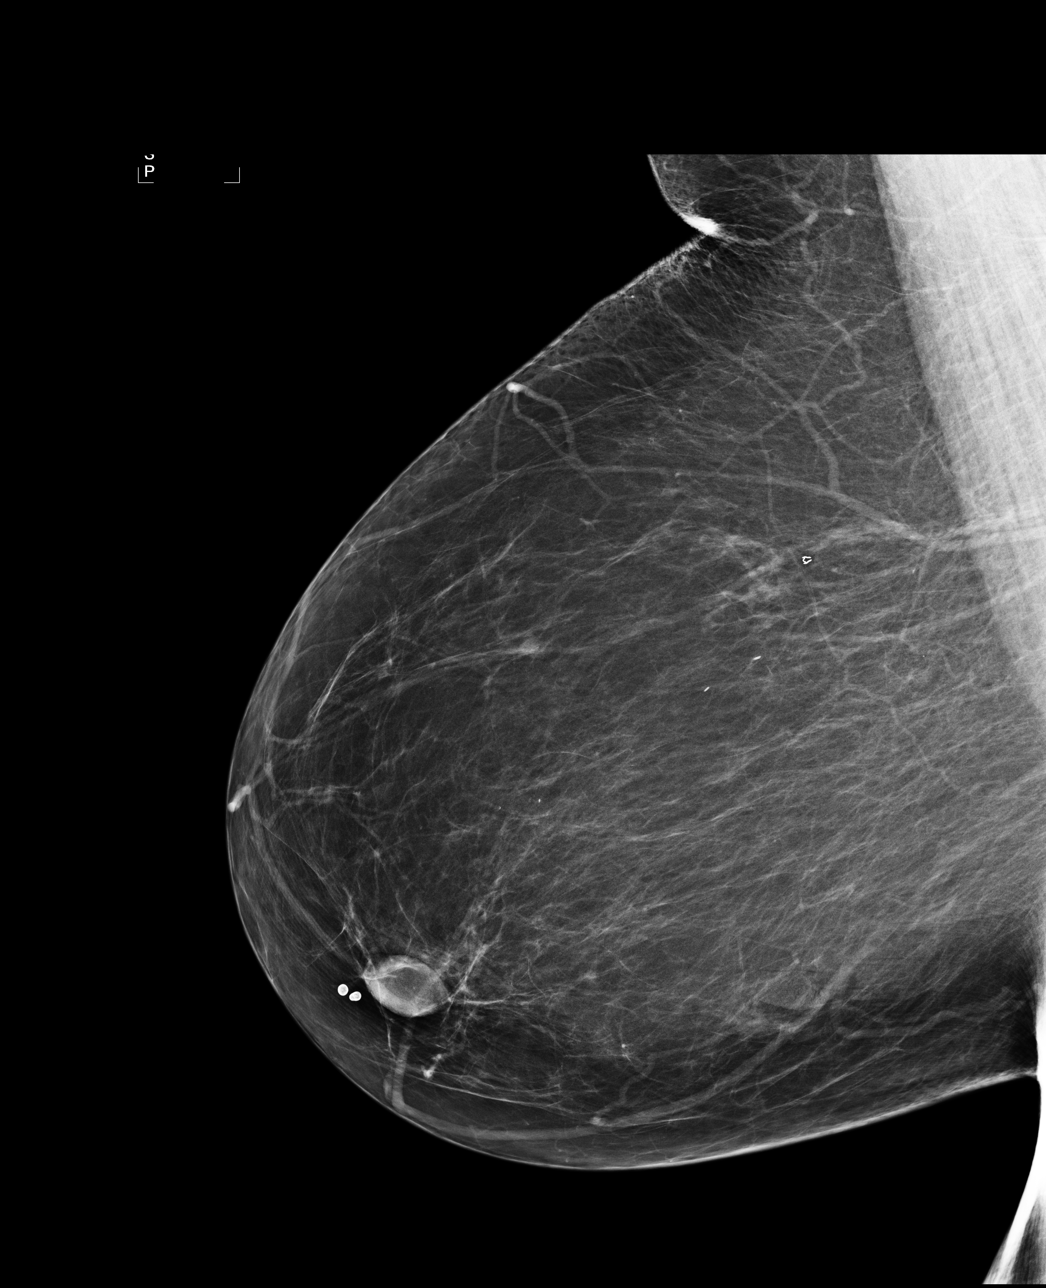

[4 of 4 positions shown; findings below may reference images not displayed]

The breast tissue is almost entirely fatty.  There is no dominant mass, architectural distortion or
calcification to suggest malignancy.

Images were processed with CAD.
IMPRESSION: No mammographic evidence of malignancy.  Suggest yearly screening mammography.

A result letter of this screening mammogram will be mailed directly to the patient.

ASSESSMENT: Negative - BI-RADS 1

Screening mammogram in 1 year.
,

## 2011-02-19 ENCOUNTER — Encounter: Payer: Medicare Other | Admitting: Oncology

## 2011-03-05 ENCOUNTER — Encounter (HOSPITAL_BASED_OUTPATIENT_CLINIC_OR_DEPARTMENT_OTHER): Payer: Medicare Other | Admitting: Oncology

## 2011-03-05 ENCOUNTER — Other Ambulatory Visit: Payer: Self-pay | Admitting: Oncology

## 2011-03-05 DIAGNOSIS — D126 Benign neoplasm of colon, unspecified: Secondary | ICD-10-CM

## 2011-03-05 DIAGNOSIS — C182 Malignant neoplasm of ascending colon: Secondary | ICD-10-CM

## 2011-03-05 DIAGNOSIS — E119 Type 2 diabetes mellitus without complications: Secondary | ICD-10-CM

## 2011-03-05 DIAGNOSIS — D509 Iron deficiency anemia, unspecified: Secondary | ICD-10-CM

## 2011-03-05 LAB — COMPREHENSIVE METABOLIC PANEL
ALT: 14 U/L (ref 0–35)
AST: 18 U/L (ref 0–37)
Albumin: 3.4 g/dL — ABNORMAL LOW (ref 3.5–5.2)
Alkaline Phosphatase: 36 U/L — ABNORMAL LOW (ref 39–117)
BUN: 15 mg/dL (ref 6–23)
CO2: 25 mEq/L (ref 19–32)
Calcium: 8.8 mg/dL (ref 8.4–10.5)
Chloride: 97 mEq/L (ref 96–112)
Creatinine, Ser: 0.92 mg/dL (ref 0.4–1.2)
GFR calc Af Amer: >60 mL/min (ref >60)
GFR calc non Af Amer: >60 mL/min (ref >60)
Glucose, Bld: 75 mg/dL (ref 70–99)
Potassium: 4.2 mEq/L (ref 3.5–5.1)
Sodium: 132 mEq/L — ABNORMAL LOW (ref 135–145)
Total Bilirubin: 1.1 mg/dL (ref 0.3–1.2)
Total Protein: 6.8 g/dL (ref 6.0–8.3)

## 2011-03-05 LAB — DIFFERENTIAL
Basophils Absolute: 0 10*3/uL (ref 0.0–0.1)
Basophils Relative: 0 % (ref 0–1)
Eosinophils Absolute: 0 10*3/uL (ref 0.0–0.7)
Eosinophils Relative: 0 % (ref 0–5)
Lymphocytes Relative: 15 % (ref 12–46)
Lymphs Abs: 1.3 10*3/uL (ref 0.7–4.0)
Monocytes Absolute: 0.9 10*3/uL (ref 0.1–1.0)
Monocytes Relative: 11 % (ref 3–12)
Neutro Abs: 6.3 10*3/uL (ref 1.7–7.7)
Neutrophils Relative %: 74 % (ref 43–77)

## 2011-03-05 LAB — CBC
HCT: 30.6 % — ABNORMAL LOW (ref 36.0–46.0)
Hemoglobin: 10.3 g/dL — ABNORMAL LOW (ref 12.0–15.0)
MCHC: 33.6 g/dL (ref 30.0–36.0)
MCV: 98.5 fL (ref 78.0–100.0)
Platelets: 200 10*3/uL (ref 150–400)
RBC: 3.11 MIL/uL — ABNORMAL LOW (ref 3.87–5.11)
RDW: 14.9 % (ref 11.5–15.5)
WBC: 8.5 10*3/uL (ref 4.0–10.5)

## 2011-03-05 LAB — CBC WITH DIFFERENTIAL/PLATELET
BASO%: 0.5 % (ref 0.0–2.0)
Basophils Absolute: 0 10*3/uL (ref 0.0–0.1)
EOS%: 6.4 % (ref 0.0–7.0)
Eosinophils Absolute: 0.3 10*3/uL (ref 0.0–0.5)
HCT: 28.1 % — ABNORMAL LOW (ref 34.8–46.6)
HGB: 8.8 g/dL — ABNORMAL LOW (ref 11.6–15.9)
LYMPH%: 37 % (ref 14.0–49.7)
MCH: 22.9 pg — ABNORMAL LOW (ref 25.1–34.0)
MCHC: 31.4 g/dL — ABNORMAL LOW (ref 31.5–36.0)
MCV: 72.9 fL — ABNORMAL LOW (ref 79.5–101.0)
MONO#: 0.4 10*3/uL (ref 0.1–0.9)
MONO%: 8.2 % (ref 0.0–14.0)
NEUT#: 2.4 10*3/uL (ref 1.5–6.5)
NEUT%: 47.9 % (ref 38.4–76.8)
Platelets: 364 10*3/uL (ref 145–400)
RBC: 3.86 10*6/uL (ref 3.70–5.45)
RDW: 22.8 % — ABNORMAL HIGH (ref 11.2–14.5)
WBC: 5 10*3/uL (ref 3.9–10.3)
lymph#: 1.9 10*3/uL (ref 0.9–3.3)

## 2011-03-05 LAB — URINE MICROSCOPIC-ADD ON

## 2011-03-05 LAB — URINALYSIS, ROUTINE W REFLEX MICROSCOPIC
Glucose, UA: NEGATIVE mg/dL
Ketones, ur: 15 mg/dL — AB
Nitrite: NEGATIVE
Protein, ur: 30 mg/dL — AB
Specific Gravity, Urine: 1.02 (ref 1.005–1.030)
Urobilinogen, UA: 1 mg/dL (ref 0.0–1.0)
pH: 5.5 (ref 5.0–8.0)

## 2011-03-05 LAB — GLUCOSE, CAPILLARY: Glucose-Capillary: 69 mg/dL — ABNORMAL LOW (ref 70–99)

## 2011-04-02 ENCOUNTER — Encounter (HOSPITAL_BASED_OUTPATIENT_CLINIC_OR_DEPARTMENT_OTHER): Payer: Medicare Other | Admitting: Oncology

## 2011-04-02 ENCOUNTER — Other Ambulatory Visit: Payer: Self-pay | Admitting: Oncology

## 2011-04-02 DIAGNOSIS — D126 Benign neoplasm of colon, unspecified: Secondary | ICD-10-CM

## 2011-04-02 DIAGNOSIS — D509 Iron deficiency anemia, unspecified: Secondary | ICD-10-CM

## 2011-04-02 DIAGNOSIS — E119 Type 2 diabetes mellitus without complications: Secondary | ICD-10-CM

## 2011-04-02 DIAGNOSIS — C182 Malignant neoplasm of ascending colon: Secondary | ICD-10-CM

## 2011-04-02 LAB — CBC WITH DIFFERENTIAL/PLATELET
BASO%: 0.5 % (ref 0.0–2.0)
Basophils Absolute: 0 10*3/uL (ref 0.0–0.1)
EOS%: 9.1 % — ABNORMAL HIGH (ref 0.0–7.0)
Eosinophils Absolute: 0.4 10*3/uL (ref 0.0–0.5)
HCT: 31.8 % — ABNORMAL LOW (ref 34.8–46.6)
HGB: 10.2 g/dL — ABNORMAL LOW (ref 11.6–15.9)
LYMPH%: 39.1 % (ref 14.0–49.7)
MCH: 25.3 pg (ref 25.1–34.0)
MCHC: 32.2 g/dL (ref 31.5–36.0)
MCV: 78.6 fL — ABNORMAL LOW (ref 79.5–101.0)
MONO#: 0.4 10*3/uL (ref 0.1–0.9)
MONO%: 8.6 % (ref 0.0–14.0)
NEUT#: 1.9 10*3/uL (ref 1.5–6.5)
NEUT%: 42.7 % (ref 38.4–76.8)
Platelets: 249 10*3/uL (ref 145–400)
RBC: 4.05 10*6/uL (ref 3.70–5.45)
RDW: 24.9 % — ABNORMAL HIGH (ref 11.2–14.5)
WBC: 4.5 10*3/uL (ref 3.9–10.3)
lymph#: 1.8 10*3/uL (ref 0.9–3.3)

## 2011-04-13 NOTE — Procedures (Signed)
NAME:  [name] [name], [name] [name]             ACCOUNT NO.:  [id number]   MEDICAL RECORD NO.:  [id number]           PATIENT TYPE:  OUT   LOCATION:  SLEEP CENTER                 FACILITY:  [facility]   PHYSICIAN:  Clinton D. Maple Hudson, MD, FCCP, FACPDATE OF BIRTH:  1945/02/14   DATE OF STUDY:  08/09/2007                            NOCTURNAL POLYSOMNOGRAM   REFERRING PHYSICIAN:  Clinton D. Young, MD, FCCP, FACP   INDICATION FOR STUDY:  Hypersomnia with sleep apnea.   EPWORTH SLEEPINESS SCORE:  3/24, BMI 51.7, weight 285 pounds.   MEDICATIONS:  Home medications are listed and reviewed.   SLEEP ARCHITECTURE:  Total sleep time 273 minutes with sleep efficiency  77%.  Stage I was 17%, stage II 73%, stage III 2%, REM 8% of total sleep  time.  Sleep latency 7 minutes, REM latency 227 minutes, awake after  sleep onset 76 minutes arousal index 13.4.  No bedtime medication was  taken.   RESPIRATORY DATA:  Apnea-hypopnea index (AHI, RDI) 16.5 obstructive  events per hour, indicating moderate obstructive sleep apnea/hypopnea  syndrome.  There were 2 obstructive apneas and 73 hypopneas.  Events  were not positional.  REM AHI 46.4.  There were insufficient events to  qualify for CPAP titration by split protocol on this study night.   OXYGEN DATA:  Loud snoring with oxygen desaturation nadir 83%.  Mean  oxygen saturation through the study 92% on room air.   CARDIAC DATA:  Normal sinus rhythm.   MOVEMENT-PARASOMNIA:  No significant movement disturbance.  Bathroom x1.   IMPRESSIONS-RECOMMENDATIONS:  1. This study was done as a daytime study to accommodate the patient's      usual sleep schedule, recording from lights out at 9:14 a.m. until      3:08 p.m. with no bedtime medication taken.  2. Moderate obstructive sleep apnea/hypopnea syndrome, apnea-hypopnea      index 16.5 per hour, with nonpositional events, loud snoring and      oxygen desaturation nadir of 83%.  3. She had insufficient early events by  protocol to permit continuous      positive airway pressure titration during this      study, but should be considered for a trial of continuous positive      airway pressure therapy and can return for continuous positive      airway pressure titration if appropriate.      Clinton D. Maple Hudson, MD, [facility], FACP  Diplomate, Biomedical engineer of Sleep Medicine  Electronically Signed     CDY/MEDQ  D:  08/12/2007 15:17:03  T:  08/13/2007 12:56:31  Job:  [id number]

## 2011-04-13 NOTE — Procedures (Signed)
NAME:  [name] [name], MENGE             ACCOUNT NO.:  [id number]   MEDICAL RECORD NO.:  [id number]           PATIENT TYPE:  OUT   LOCATION:  SLEEP CENTER                 FACILITY:  [facility]   PHYSICIAN:  Clinton D. Maple Hudson, MD, FCCP, FACPDATE OF BIRTH:  [birth year] 05, 1946   DATE OF STUDY:  08/09/2007                            NOCTURNAL POLYSOMNOGRAM   REFERRING PHYSICIAN:  Clinton D. Young, MD, FCCP, FACP   INDICATION FOR STUDY:  Hypersomnia with sleep apnea.   EPWORTH SLEEPINESS SCORE:  3/24, BMI 51.7, weight 285 pounds.   MEDICATIONS:  Home medications are listed and reviewed.   SLEEP ARCHITECTURE:  Total sleep time 273 minutes with sleep efficiency  77%.  Stage I was 17%, stage II 73%, stage III 2%, REM 8% of total sleep  time.  Sleep latency 7 minutes, REM latency 227 minutes, awake after  sleep onset 76 minutes arousal index 13.4.  No bedtime medication was  taken.   RESPIRATORY DATA:  Apnea-hypopnea index (AHI, RDI) 16.5 obstructive  events per hour, indicating moderate obstructive sleep apnea/hypopnea  syndrome.  There were 2 obstructive apneas and 73 hypopneas.  Events  were not positional.  REM AHI 46.4.  There were insufficient events to  qualify for CPAP titration by split protocol on this study night.   OXYGEN DATA:  Loud snoring with oxygen desaturation nadir 83%.  Mean  oxygen saturation through the study 92% on room air.   CARDIAC DATA:  Normal sinus rhythm.   MOVEMENT-PARASOMNIA:  No significant movement disturbance.  Bathroom x1.   IMPRESSIONS-RECOMMENDATIONS:  1. This study was done as a daytime study to accommodate the patient's      usual sleep schedule, recording from lights out at 9:14 a.m. until      3:08 p.m. with no bedtime medication taken.  2. Moderate obstructive sleep apnea/hypopnea syndrome, apnea-hypopnea      index 16.5 per hour, with nonpositional events, loud snoring and      oxygen desaturation nadir of 83%.  3. She had insufficient early events by  protocol to permit continuous      positive airway pressure titration during this      study, but should be considered for a trial of continuous positive      airway pressure therapy and can return for continuous positive      airway pressure titration if appropriate.      Clinton D. Maple Hudson, MD, [facility], FACP  Diplomate, American Board of Sleep Medicine     CDY/MEDQ  D:  08/12/2007 15:17:03  T:  08/13/2007 12:56:31  Job:  [id number]

## 2011-04-13 NOTE — Assessment & Plan Note (Signed)
[facility]                             PULMONARY OFFICE NOTE   NAME:[name], [name] YIN                    MRN:          [id number]  DATE:08/08/2007                            DOB:          09/07/1946    SLEEP MEDICINE CONSULTATION:   PROBLEM:  Sleep medicine consultation at the kind request of Cherylann Ratel for this 65 year old woman who says she was noted to stop  breathing during colonoscopy.  She was scheduled for a sleep study but  chickened out.  Sister confirmed to her that she snored and seemed to  stop breathing.  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 to 1:00 p.m.  She lies in bed around 2:00  a.m. watching TV until 5:00 a.m.  Some days she naps.  She wakes herself  with her own snoring and with frequent nocturia.   MEDICATIONS:  1. Quinapril 40 mg.  2. Glipizide ER 10 mg.  3. Avandamet 4 mg/1000 mg b.i.d.  4. Famotidine 40 mg b.i.d.  5. Simvastatin 80 mg.  6. Hydrochlorothiazide 25 mg.   DRUG INTOLERANT:  CODEINE.   REVIEW OF SYSTEMS:  Nasal congestion, some seasonal rhinitis and  sneezing.  She wakes occasionally because of nasal congestion.  Weight  has been stable.  Singing makes her cough.  She does not notice routine  wheeze or phlegm.   PAST HISTORY:  1. No ENT surgery or lung disease.  2. Treated for hypertension, diabetes, allergic rhinitis.  3. She says she thinks she might have had a heart valve problem after      a diet pill.  I am not sure what the status of that is legally.  4. Fractured left wrist.  5. Carpal tunnel repair right hand.  6. Left hip replacement.  7. Heart valve 2000.  8. Breast nodules in 2000.   SOCIAL HISTORY:  She quit smoking years ago.  Drinks occasional  caffeinated soft drink.  She is widowed with no children; living alone.  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   FAMILY HISTORY:  Nobody with  sleep apnea.  Several with emphysema,  allergies, heart disease, rheumatism and cancer.   OBJECTIVE:  Weight 286 pounds, blood pressure 120/74, pulse 10, room air  saturation 98%.  She is obese, alert.  Her moderately large tonsils with  some erythema.  Active gag.  Palate spacing 3/4.  Nasal airway is not  obstructed.  LUNGS:  Clear.  HEART:  Heart sounds reveal trace systolic murmur of aortic stenosis.  No edema or tremor.   IMPRESSION:  Suspect obstructive sleep apnea.  Note third shift work  schedule has carried over as a habit.  Third shift schedule never works  out to provide as much sleep as regular schedules.  Partly because  people have to get up to come to appointments.   PLAN:  We are scheduling a split protocol nocturnal polysomnogram at the  [facility] and I will see her back in  followup when that  is completed.  I appreciate the chance to meet her.     Clinton D. Maple Hudson, MD, [facility], FACP  Electronically Signed    CDY/MedQ  DD: 08/13/2007  DT: 08/14/2007  Job #: [id number]   cc:   Cherylann Ratel

## 2011-04-13 NOTE — Assessment & Plan Note (Signed)
[facility]                             PULMONARY OFFICE NOTE   NAME:Silveira, MADDI COLLAR                    MRN:          [id number]  DATE:08/24/2007                            DOB:          01-02-1946    PROBLEM:  1. Obstructive sleep apnea.  2. Third shift worker.   HISTORY:  Her sleep study was done as a daytime study to accommodate her  habitual third shift schedule and showed moderate obstructive sleep  apnea with an index of 16.5 per hour, loud snoring and oxygen  desaturation to 83%.  She did not meet the criteria needed for split  protocol CPAP titration on that study.   MEDICATION:  1. Quinapril 40 mg.  2. Glipizide ER 10 mg.  3. Avandamet 4 mg/1000 mg b.i.d.  4. Famotidine 40 mg b.i.d.  5. Simvastatin 80 mg.  6. HCTZ 25 mg.   DRUG INTOLERANT:  CODEINE.   OBJECTIVE:  Weight 284 pounds, BP 118/70, pulse 101, room air saturation  98%.  She is overweight.  Denture plate so she is not a candidate for an  oral appliance.  Nasal airway is not obstructed.  There is no stridor.  Voice quality is normal.  Thyroid is not enlarged.  Pulse regular  without murmur.  Chest clear.   IMPRESSION:  Moderate obstructive sleep apnea complicated by obesity.  Sleep quality is affected by a third shift schedule as demonstrated by  her need to interrupt her usual sleep period for this visit.  We  discussed sleep hygiene and especially her situation.  She does have an  adequately dark bedroom.   PLAN:  1. Weight loss was encouraged.  2. Her responsibility to drive safely was reinforced.  3. We are titrating CPAP.  4. Schedule return in 6 weeks, earlier p.r.n.    Clinton D. Maple Hudson, MD, [facility], FACP  Electronically Signed   CDY/MedQ  DD: 08/28/2007  DT: 08/28/2007  Job #: [id number]   cc:   Elizabeth Palau, N.P.

## 2011-04-13 NOTE — Assessment & Plan Note (Signed)
[facility]                             PULMONARY OFFICE NOTE   NAME:[name] [name], [name] [name]                    MRN:          [id number]  DATE:10/13/2007                            DOB:          Apr 14, 1946    PROBLEM LIST:  1. Obstructive sleep apnea.  2. Third shift worker (retired).   HISTORY:  Estimates she is getting 1-2 hours of continuous sleep a  night.  If she wears her CPAP she goes to sleep very comfortably but  wakes later in the night sweating on her face.  The pressure and mask  fit seem comfortable, but she says she is having hot flashes despite two  fans running.  She chose a full face mask and we talked about how to  adjust the humidifier.  She cannot tell that the CPAP is confining her  in any way that contributes to sweating.  She catches up on her sleep  but seems to do it in blocks and I think poor sleep hygiene still  contributes.  She has not been able to loose weight.   MEDICATIONS:  1. Quinapril 40 mg.  2. Glipizide 10 mg.  3. Avandomet 4 mg/1000 mg b.i.d.  4. Famotidine 40 mg b.i.d.  5. Simvastatin 80 mg.  6. HCTZ 25 mg.  7. CPAP set at 11-CWP.   DRUG INTOLERANT:  CODEINE.   OBJECTIVE:  GENERAL:  Weight is up to 289 pounds and she is distinctly  obese.  There is no unusual flushing.  She does appear diaphoretic.  NECK:  I cannot tell that thyroid is enlarged.  LUNGS:  Breathing seems unlabored.  NEUROLOGIC:  She is alert.  Speech is clear.   IMPRESSION:  Obstructive sleep apnea, complicated by poor sleep hygiene  and exogenous obesity.  I am not sure about the diaphoresis since this  is not a common complaint with CPAP.   PLAN:  I have emphasized weight loss.  She is going to send in her chip  and work with the home care company on comfort issues.  Schedule return  in 3 months, earlier p.r.n.    Clinton D. Maple Hudson, MD, [facility], FACP  Electronically Signed   CDY/MedQ  DD: 10/14/2007  DT: 10/15/2007  Job #: [id number]   cc:    [facility]

## 2011-04-16 NOTE — Op Note (Signed)
NAME:  [name] [name], [name] [name]                       ACCOUNT NO.:  [id number]   MEDICAL RECORD NO.:  [id number]                   PATIENT TYPE:  INP   LOCATION:  NA                                   FACILITY:  [facility]   PHYSICIAN:  Marlowe Kays, M.D.               DATE OF BIRTH:  [birth year]   DATE OF PROCEDURE:  10/18/2003  DATE OF DISCHARGE:                                 OPERATIVE REPORT   PREOPERATIVE DIAGNOSIS:  Loose, malpositioned acetabular component, left  total hip replacement.   POSTOPERATIVE DIAGNOSIS:  Loose, malpositioned acetabular component, left  total hip replacement.   OPERATION:  Revision of acetabular component, left total hip replacement.   SURGEON:  Illene Labrador. [place], M.D.   ASSISTANT:  [name]. Charlann Boxer, M.D.   ANESTHESIA:  General.   PATHOLOGY AND JUSTIFICATION FOR PROCEDURE:  Original procedure was performed  by another orthopedic surgeon in 1994.  She presented to me in October of  this year with groin pain, and x-rays demonstrated that the acetabular  component of her total hip was vertical, and the hip was subluxed.  The  femoral component appeared to be intact and stable.  It was a porous coated.  There was no evidence for any infection.  She is here today for revision of  the acetabular component.   DESCRIPTION OF PROCEDURE:  Prophylactic antibiotics, satisfactory general  anesthesia, Foley catheter inserted, right lateral decubitus position on the  Westway II frame.  Left hip was prepped with DuraPrep, draped in a sterile  field, Ioban employed.  Posterolateral incision down through the fascia  lata.  Zickel's band was cut.  External rotators were detached from the  femur and the hip capsule identified and opened, found the subluxed  dislocated femoral head and began removing fibrous tissue from around it,  gradually freeing up the prosthesis that we could remove the AMF 32 mm head,  and then I continued to work to free up the fibrous tissue from around  the  acetabulum and allow for enough mobility for the femoral component to move  it anteriorly and medially.  The femoral component itself appeared to be  stable.  There was no evidence for any infection.  I was able to get a  curved osteotome around the metal back portion on the acetabulum, and we  were able to lever it out.  Her acetabulum did not need to be deepened since  we were down to the medial wall in the depths of the wound.  A good bit of  metalosis type reaction was removed.  Began using expanding reamers and  worked up to a 54 where there was a nice stable fit to the trial cup.  I  then used bank crouton cancellous bone which I crushed up and placed in the  center of the acetabulum, filling the defect there and incorporated it  further by using one of our reamers, first in  reverse and then using the 52  and 54 reamers to be sure that our cup had enough depth to it.  When we were  satisfied that our acetabulum was prepared, I used a [employer] cup  to accept a 36 mm head, and we impacted it tightly and then went through a  trial reduction without screw fixation, found that the hip was nice and  stable.  Accordingly, I then stabilized the cup further with two superior  screws which were individually drilled, measured, and screwed.  Then placed  the final acetabular component which was the 10 degree 36 mm polyethylene  liner with the buttress at about 1 o'clock.  The metal back cup had a little  bit of uncovering from about 12 o'clock to 2 o'clock, but the remainder of  the cup was well supported.  After impacting the polyethylene insert and  made sure it was nice and stable, we then went through a trial reduction  with various DePuy 36 mm neck lengths and found that a plus 11 was the  length of choice, with the hip being nice and stable in all directions.  Accordingly, the final DePuy 36 mm tapered head was placed, hip reduced and  found to be stable.  We then irrigated  the wound well with antibiotic  solution.  Zickel's band was reconstituted with interrupted #1 Vicryl as was  the fascia lata.  Subcutaneous was closed with a combination of #1 and 2-0  Vicryl and staples in the skin.  She was gently placed on her back in an  abduction pillow and at the time of this dictation was on her way to the  recovery room in satisfactory condition with no known complications.  Estimated blood loss 1100 mL.  No blood replacement.                                               Marlowe Kays, M.D.    JA/MEDQ  D:  10/18/2003  T:  10/18/2003  Job:  [id number]

## 2011-04-16 NOTE — Procedures (Signed)
[place]  Patient:    [name] [name], [name] [name]                    MRN: [id number] Proc. Date: 11/10/00 Adm. Date:  [id number] Attending:  Charna Elizabeth CC:         Elvina Sidle, M.D.   Procedure Report  DATE OF BIRTH:  12/07/1945  PROCEDURE PERFORMED:  Colonoscopy with biopsies.  ENDOSCOPIST:  Anselmo Rod, M.D.  INSTRUMENT USED:  Olympus video colonoscope.  INDICATION FOR PROCEDURE:  Polyp seen on flexible sigmoidoscopy during routine screening in a 65 year old white female, rule out other colonic polyps in the right colon and transverse colon.  Polypectomy planned, if need be.  PREPROCEDURE PREPARATION:  Informed consent was procured from the patient. The patient was fasted for eight hours prior to the procedure and prepped with a bottle of magnesium citrate and a gallon of NuLytely the night prior to the procedure.  PREPROCEDURE PHYSICAL:  The patient had stable vital signs.  NECK:  Supple.  CHEST:  Clear to auscultation.  S1 and S2 regular.  No murmur, rub, gallop, rales, rhonchi or wheezing.  ABDOMEN: Soft with normal abdominal bowel sounds.  No hepatosplenomegaly or masses palpable.  DESCRIPTION OF PROCEDURE:  The patient was placed in the left lateral decubitus position and sedated with 40 mg of Demerol, 4 mg of Versed intravenously.  Once the patient was adequately sedated and maintained on low flow oxygen, continuous cardiac monitoring, the Olympus video colonoscope was advanced from the rectum to the cecum with difficulty secondary to a large amount of residual stool in the colon.  Multiple washings were done.  There was a large amount of debride and therefore complete visualization of the right colon was not possible; however, a small hyperplastic appearing polyp was seen in the rectosigmoid area that was biopsied with a regular biopsy forceps.  No other abnormalities were noticed.  No erosions, ulcerations, masses,  polyps, or diverticulosis was seen.  The patient had small nonbleeding internal hemorrhoids on retroflexion.  The patient tolerated the procedure well without complications.  The patients position was changed from the left lateral to the right lateral and supine position on several occasions to facilitate adequate visualization of the underlying mucosa; however, very small lesion may still be missed.  IMPRESSION: 1. Essentially normal colonoscopy except for a small polyp seen at 20 cm,    biopsied for pathology. 2. Small nonbleeding internal hemorrhoid. 3. Residual stool in the right colon and transverse colon.  A very small    lesion may have been missed. RECOMMENDATIONS:  The patient was advised to await pathology results and follow up in the office on a p.r.n. basis. DD:  11/10/00 TD:  11/10/00 Job: [id number]

## 2011-04-16 NOTE — Op Note (Signed)
NAME:  [name] [name], [name] [name]             ACCOUNT NO.:  [id number]   MEDICAL RECORD NO.:  [id number]          PATIENT TYPE:  AMB   LOCATION:  ENDO                         FACILITY:  [facility]   PHYSICIAN:  Anselmo Rod, M.D.  DATE OF BIRTH:  02/18/1928   DATE OF PROCEDURE:  09/07/2006  DATE OF DISCHARGE:                                 OPERATIVE REPORT   PROCEDURE PERFORMED:  Screening colonoscopy.   ENDOSCOPIST:  Anselmo Rod, M.D.   INSTRUMENT USED:  Olympus video colonoscope.   INDICATIONS FOR PROCEDURE:  A 65 year old white female with a history of  iron-deficiency anemia, black stools, and change in bowel habits undergoing  a colonoscopy.  Patient was found to have trace guaiac-positive stool on  routine physical in the recent past,  rule out colonic polyps, masses, etc.   PRE-PROCEDURE PREPARATION:  Informed consent was procured from the patient.  The patient was fasted for eight hours prior to the procedure and prepped  with Dulcolax pills and a gallon of TriLyte the night prior to the  procedure.  Risks and benefits of the procedure, including a 10% missed rate  of cancer and polyps, were discussed with the patient as well.   PRE-PROCEDURE PHYSICAL EXAMINATION:  VITAL SIGNS:  Stable vital signs.  NECK:  Supple.  CHEST:  Clear to auscultation.  HEART:  S1 and S2.  Regular.  ABDOMEN:  Soft, morbidly obese, with omental hernia present.  No  hepatosplenomegaly appreciated.   DESCRIPTION OF PROCEDURE:  The patient was placed in the left lateral  decubitus position and sedated with an additional 65 mcg of fentanyl and 5  mg of Versed given intravenously in slow, incremental doses.  Once the  patient was adequately sedated and maintained on low flow oxygen and  continuous cardiac monitoring, the Olympus video colonoscope was advanced  from the rectum to the cecum with difficulty.  There was a significant  amount of residual stool in the colon with debris from a previous meal.  Multiple washes were done.  The appendicle orifice and ileocecal valve were  visualized after changing the patient's position from the left lateral  decubitus to the supine and then to the right lateral position with gentle  application of abdominal pressure.  There were a few early diverticula noted  in the sigmoid colon.  The rest of the exam was unremarkable.  Retroflexion  in the rectum revealed no abnormalities.  An erythematous spot was noted in  the sigmoid colon, but it was not biopsied.  There may have been some mild  ischemic changes in this area, but this could not be said for sure.  No  ulceration was noted.  No masses or polyps were identified.  Small lesions  could be missed.  Retroflexion in the rectum revealed no abnormalities.   IMPRESSION:  1. A large amount of residual stool in the colon, small lesions could be      missed.  Multiple washings done.  2. A few early sigmoid diverticula.  3. No masses or polyps identified.  4. A technically difficult procedure because of the patient's body  habitus.  The patient had some drop in her saturation during the      procedure, and I suspect she may have sleep apnea; therefore, a sleep      study has been advised.  Patient has been advised to see her PCP for      further workup of this problem.  5. Enrollment in to a strict weight loss program has been advised to help      with her morbid obesity.  Further recommendations will be made in      followup in the office in the next two weeks.  A repeat colonoscopy      will be planned in the next five years.      Anselmo Rod, M.D.  Electronically Signed     JNM/MEDQ  D:  09/07/2006  T:  09/08/2006  Job:  [id number]   cc:   Marjory Lies, M.D.

## 2011-04-16 NOTE — Discharge Summary (Signed)
NAME:  [name] [name], [name] [name]                       ACCOUNT NO.:  [id number]   MEDICAL RECORD NO.:  [id number]                   PATIENT TYPE:  INP   LOCATION:  0472                                 FACILITY:  [facility]   PHYSICIAN:  Marlowe Kays, M.D.               DATE OF BIRTH:  August 27, 1946   DATE OF ADMISSION:  10/18/2003  DATE OF DISCHARGE:  10/22/2003                                 DISCHARGE SUMMARY   ADMITTING DIAGNOSES:  1. 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  2. Type 2 diabetes.  3. Hypertension.   DISCHARGE DIAGNOSES:  1. 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  2. Type 2 diabetes.  3. Hypertension.  4. Postoperative anemia treated with transfusion.   CONSULTS:  None.   OPERATION:  On October 18, 2003 the patient underwent revision of  acetabular component left total hip replacement.  [name] [name] Charlann [name], M.D.  assisted.   BRIEF HISTORY:  This 65 year old white female underwent a left total hip  replacement arthroplasty by another practice in 1994.  In October of this  year began having increasing pain into the left groin.  She presented  finding more and more difficulty getting about and participating in her day  to day activities.  X-rays showed that the acetabular component had shifted  into a vertical posture and she had subluxation of the prosthetic femoral  head in the acetabulum component.  Fortunately, the femoral component seemed  intact.  As she is a very active lady, it is felt she would benefit from  surgical intervention and is admitted for the above procedure.   HOSPITAL COURSE:  The patient tolerated surgical procedure quite well.  She  had considerable amount of pain and discomfort which was expected  postoperatively, but was eager to enter into the total hip protocol.  Unfortunately, she had a drop in her hemoglobin.  She felt weak and woozy.  We discussed transfusion with her.  She agreed and as  her hemoglobin was 8.6  which was 3 g down from her preoperative hemoglobin, it was felt she would  benefit from a transfusion.  After transfusion her hemoglobin came up to  9.5.  Hematocrit was 27.9.   The patient then did very well with her therapy, ambulating in her room,  near independent with help from her sister.  She had moderate amount of  drainage to the left hip wound.  As expected, it was serous in nature.   On the day of discharge she is very anxious to go home.  Marlowe Kays,  M.D. saw the patient, thought she would be safe at home.  She will be  discharged home using [place] for home health.   LABORATORIES:  Preoperative CBC with a normocytic, normochromic anemia.  Hemoglobin was 11.9, hematocrit 35.1 as previously dictated.  Final  hemoglobin was 9.5 with a hematocrit of 27.9.  Blood chemistries were within  normal limits.  Urinalysis was negative for urinary tract infection.  Blood  type was A-.  Chest x-ray showed prominent markings without acute  abnormality of the chest.  Postoperative hip x-ray showed good position and  alignment.  Electrocardiogram:  Normal sinus rhythm with first degree AV  block.   CONDITION ON DISCHARGE:  Improved, stable.   PLAN:  The patient is discharged to her home in the care of her family and  home health with [place].  Continue with Coumadin protocol for four weeks  after the date of surgery.  Continue with home medications and diet and  follow up with Dr. Olene Floss al for any medical problems.  The patient did  have a mild headache post transfusion.  It was better on the day of  discharge.  We recommend she use OTC Tylenol or follow up with Dr. Lenard Galloway  for that.  Return to see [place] in about two weeks after the date of surgery.   DISCHARGE MEDICATIONS:  1. Demerol tablets 50 mg #50 one to two q.4-6h. p.r.n. pain.  No refill.  2. Robaxin 500 mg #30 with two refills one q.6h. p.r.n. muscle spasm.  3. Trinsicon #60 one b.i.d.  4.  Coumadin per pharmacy protocol.   DISCHARGE INSTRUCTIONS:  Call if any problems.     Dooley L. [name].                 Marlowe Kays, M.D.    DLU/MEDQ  D:  10/22/2003  T:  10/22/2003  Job:  [id number]   cc:   Lenard Galloway, M.D.  [facility]

## 2011-04-16 NOTE — H&P (Signed)
NAME:  [name] [name], [name] [name]                       ACCOUNT NO.:  [id number]   MEDICAL RECORD NO.:  [id number]                   PATIENT TYPE:  INP   LOCATION:  NA                                   FACILITY:  [facility]   PHYSICIAN:  Marlowe Kays, M.D.               DATE OF BIRTH:  1946/07/31   DATE OF ADMISSION:  10/13/2003  DATE OF DISCHARGE:                                HISTORY & PHYSICAL   CHIEF COMPLAINT:  Pain in my left hip and groin.   HISTORY OF PRESENT ILLNESS:  This 65 year old white female, who has  previously undergone a left total hip replacement arthroplasty, in September  1994, by Dr. Reynolds Bowl.  She has done well over the years but about two  months ago she began increasing pain and discomfort in the left, difficulty  in ambulation, difficulty getting in and out of a vehicle or even coming to  a standing position.  She now has to use a walker for ambulation.  Occasionally the leg will give way.  She is quite fearful of falling.  X-  rays of the left hip show a rotation of the acetabular component of the  total hip to a vertical posture.  Fortunately the femoral component of the  total hip is in good condition.  After much discussion and consideration,  the fact that this is an active lady, it was felt she would benefit with  surgical intervention revision of the acetabular cup.  We have contacted the  records of Dr. Criss Alvine and have found out that the components previously used  and will match same.   PAST MEDICAL HISTORY:  1. The patient has been in relatively good health throughout her lifetime.  2. She does have type 2 diabetes.  3. Hypertension.  4. She has had migraine headaches in the past but none recently.   PAST SURGICAL HISTORY:  1. Total hip in 1994 as mentioned above.  2. Right carpal tunnel release.  3. Open reduction internal fixation of the left wrist.   ALLERGIES:  She is allergic to CODEINE.   CURRENT MEDICATIONS:  1. Lipitor 40 mg one at  bedtime.  2. Avandomet 500 mg two in the morning and two at night.  3. Glipizide 10 mg one every day.  4. Accupril 20 mg one every day.   Her family physician is Dr. Lenard Galloway of [facility].   SOCIAL HISTORY:  The patient is retired, neither smokes nor drinks.  Caregiver is her sister after surgery.   FAMILY HISTORY:  Positive for heart disease in the father, diabetes in the  mother and uncle and a stroke in the mother.   REVIEW OF SYSTEMS:  CNS:  No seizure disorder, problems with blurred vision  or double vision.  RESPIRATORY:  No productive cough, no hemoptysis, no  shortness of breath.  CARDIOVASCULAR:  No chest pain, no angina, no  orthopnea.  GENITOURINARY:  The patient has frequency but no dysuria or  hematuria.  MUSCULOSKELETAL:  Primarily in the present illness with the left  hip.   PHYSICAL EXAMINATION:  GENERAL:  Alert, cooperative, friendly, 65 year old  white female, slightly obese, accompanied by her sister.  VITAL SIGNS:  Blood pressure 140/90, pulse 80, respirations are 12.  HEENT:  Normocephalic.  PERRLA.  EOMs intact.  Oropharynx is clear.  CHEST:  Clear to auscultation.  No rhonchi, no rales.  HEART:  Regular rate and rhythm.  No murmurs are heard.  ABDOMEN:  Soft, nontender.  Liver and spleen not felt.  GENITALIA:  Not done.  Not pertinent to present illness.  RECTAL:  Not done.  Not pertinent to present illness.  EXTREMITIES:  The patient has pretty good range of motion of the hip without  pain.  Atrophy seen in the left thigh.  Neurovascular is intact to the left  lower extremity .   ADMITTING DIAGNOSES:  1. Loosening acetabular component of AML total hip replacement arthroplasty     on the left.  2. Type 2 diabetes.  3. Hypertension.   PLAN:  The patient will be admitted for revision of an acetabular component  of a left total hip replacement arthroplasty.     Dooley L. [name].                 Marlowe Kays, M.D.     DLU/MEDQ  D:  10/08/2003  T:  10/08/2003  Job:  [id number]   cc:   Lenard Galloway, Dr.  [facility] [address]  [place], [place]

## 2011-04-16 NOTE — Op Note (Signed)
[facility]  Patient:    [name] [name], [name] [name]                      MRN: [id number] Proc. Date: 11/16/00 Attending:  Fayrene Fearing P. Aplington, M.D.                           Operative Report  PREOPERATIVE DIAGNOSIS:  Right carpal tunnel syndrome.  POSTOPERATIVE DIAGNOSIS:  Right carpal tunnel syndrome.  OPERATION:  Decompression of median nerve, right wrist and hand.  SURGEON:  Illene Labrador. Aplington, M.D.  ASSISTANT:  Nurse.  ANESTHESIA:  IV regional.  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  At surgery, she had compression in the mid palm primarily but also proximal to the wrist as well.  DESCRIPTION OF PROCEDURE:  After satisfactory IV regional anesthesia, DuraPrep from mid forearm to finger tips, was draped in sterile field.  She had a beefy type hand and arm; and, from the very beginning, there was a good bit of venous ooze.  I used bipolar cautery throughout the case.  The median nerve was identified at the wrist, and the overlying tissues were released which included skin, subcutaneous tissue, and a very thick fascia in the mid palm particularly.  The nerve was dissected out through the vascular arcade in the mid palm and the branches into the distal hand.  Because of the potential for bleeding complications postoperatively, after irrigating the wound well with sterile saline, I placed a rubber band drain throughout the length of the incision, exiting proximally.  I had the usual closure of skin and subcutaneous tissue only with simple and mattress interrupted 4-0 nylon.  I placed her in a bulky hand dressing with plans to have her return to the office in 48 hours for removal of the dressing and the rubber band drain. After I had completed the dressing with a volar splint, tourniquet was being released, and at the time of this dictation, she was doing  well and on her way to recovery in satisfactory condition. DD:  11/16/00 TD:  11/17/00 Job: [id number]

## 2011-04-16 NOTE — Op Note (Signed)
NAME:  [name] [name], ARCHAMBEAULT             ACCOUNT NO.:  [id number]   MEDICAL RECORD NO.:  [id number]          PATIENT TYPE:  AMB   LOCATION:  ENDO                         FACILITY:  [facility]   PHYSICIAN:  Anselmo Rod, M.D.  DATE OF BIRTH:  10/13/1946   DATE OF PROCEDURE:  09/07/2006  DATE OF DISCHARGE:                                 OPERATIVE REPORT   PROCEDURE PERFORMED:  Esophagogastroduodenoscopy with multiple cold  biopsies.   ENDOSCOPIST:  Anselmo Rod, M.D.   INSTRUMENT USED:  Olympus video panendoscope.   INDICATIONS FOR PROCEDURE:  The patient is a 65 year old white female with a  history of iron deficiency anemia and change in bowel habits with trace  guaiac positive stools, undergoing an esophagogastroduodenoscopy to rule out  peptic ulcer disease, esophagitis, gastritis, etc.   PREPROCEDURE PREPARATION:  Informed consent was procured from the patient.  The patient was fasted for eight hours prior to the procedure.  The risks  and benefits of the procedure were discussed with the patient in great  detail.   PREPROCEDURE PHYSICAL:  The patient had stable vital signs.  Neck supple,  chest clear to auscultation.  S1, S2 regular.  Abdomen soft with normal  bowel sounds.   DESCRIPTION OF PROCEDURE:  The patient was placed in the left lateral  decubitus position and sedated with 60 mcg of fentanyl and 7.5 mg of Versed  given intravenously  in slow incremental doses.  Once the patient was  adequately sedated and maintained on low-flow oxygen and continuous cardiac  monitoring, the Olympus video panendoscope was advanced through the mouth  piece over the tongue into the esophagus under direct vision.  The entire  esophagus was widely patent with no evidence of ring, stricture, masses,  esophagitis or Barrett's mucosa.  The scope was then advanced to the  stomach.  There was no evidence of a hiatal hernia on high retroflexion.  An  antral scar was noted which could be from the  previous ulcer that she  describes having in the past.  Diffuse gastritis was present.  Antral  biopsies were done to rule out presence of Helicobacter pylori by pathology.  The proximal small bowel appeared normal.  There was no outlet obstruction.  Small bowel biopsies were done to rule out sprue.  The patient tolerated the  procedure well without immediate complications.   IMPRESSION:  1. Widely patent esophagus with no evidence of ring stricture, mass,      esophagitis or Barrett's mucosa.  No evidence of structural abnormality      to explain the patient's dysphagia.  2. Diffuse gastritis with no evidence of a hiatal hernia.  Scar noted in      the antrum, question previous ulcer disease.  Antral biopsies done to      rule out Helicobacter pylori by pathology.  3. Normal proximal small bowel, small bowel biopsies done to rule out      sprue.   RECOMMENDATIONS:  1. Await pathology results.  2. Continue proton pump inhibitors.  3. Esophageal manometry will be done if the patient's dysphagia persists  after she avoids very hot or very cold liquids that may be causing      esophageal spasm.  Further recommendation will be made thereafter.  4. Proceed with colonoscopy at this time.      Anselmo Rod, M.D.  Electronically Signed     JNM/MEDQ  D:  09/07/2006  T:  09/08/2006  Job:  [id number]   cc:   Marjory Lies, M.D.

## 2011-07-21 ENCOUNTER — Encounter (INDEPENDENT_AMBULATORY_CARE_PROVIDER_SITE_OTHER): Payer: Self-pay | Admitting: General Surgery

## 2011-08-23 LAB — CBC
HCT: 33.1 — ABNORMAL LOW
Hemoglobin: 11.4 — ABNORMAL LOW
MCHC: 34.4
MCV: 91.1
Platelets: 195
RBC: 3.63 — ABNORMAL LOW
RDW: 14.3
WBC: 3.7 — ABNORMAL LOW

## 2011-08-23 LAB — DIFFERENTIAL
Basophils Absolute: 0
Basophils Relative: 1
Eosinophils Absolute: 0.2
Eosinophils Relative: 5
Lymphocytes Relative: 26
Lymphs Abs: 0.9
Monocytes Absolute: 0.5
Monocytes Relative: 12
Neutro Abs: 2.1
Neutrophils Relative %: 57

## 2011-08-23 LAB — BLOOD GAS, ARTERIAL
Acid-Base Excess: 2.5 — ABNORMAL HIGH
Bicarbonate: 25.6 — ABNORMAL HIGH
Drawn by: 187461
FIO2: 0.36
O2 Content: 4
O2 Saturation: 96.5
Patient temperature: 98.6
TCO2: 23.1
pCO2 arterial: 35.4
pH, Arterial: 7.472 — ABNORMAL HIGH
pO2, Arterial: 81.5

## 2011-08-23 LAB — COMPREHENSIVE METABOLIC PANEL
ALT: 29
AST: 40 — ABNORMAL HIGH
Albumin: 3.9
Alkaline Phosphatase: 35 — ABNORMAL LOW
BUN: 10
CO2: 28
Calcium: 8.3 — ABNORMAL LOW
Chloride: 101
Creatinine, Ser: 0.76
GFR calc Af Amer: >60
GFR calc non Af Amer: >60
Glucose, Bld: 141 — ABNORMAL HIGH
Potassium: 3.7
Sodium: 133 — ABNORMAL LOW
Total Bilirubin: 0.6
Total Protein: 7

## 2012-01-10 ENCOUNTER — Other Ambulatory Visit: Payer: Self-pay | Admitting: Family Medicine

## 2012-01-10 DIAGNOSIS — Z1231 Encounter for screening mammogram for malignant neoplasm of breast: Secondary | ICD-10-CM

## 2012-02-09 ENCOUNTER — Ambulatory Visit
Admission: RE | Admit: 2012-02-09 | Discharge: 2012-02-09 | Disposition: A | Discharge status: Home / Self Care | Payer: Medicare Other | Source: Ambulatory Visit | Attending: Family Medicine | Admitting: Family Medicine

## 2012-02-09 DIAGNOSIS — Z1231 Encounter for screening mammogram for malignant neoplasm of breast: Secondary | ICD-10-CM

## 2013-01-04 ENCOUNTER — Other Ambulatory Visit: Payer: Self-pay | Admitting: Nurse Practitioner

## 2013-01-04 DIAGNOSIS — Z1231 Encounter for screening mammogram for malignant neoplasm of breast: Secondary | ICD-10-CM

## 2013-02-12 ENCOUNTER — Ambulatory Visit
Admission: RE | Admit: 2013-02-12 | Discharge: 2013-02-12 | Disposition: A | Discharge status: Home / Self Care | Payer: Medicare Other | Source: Ambulatory Visit | Attending: Nurse Practitioner | Admitting: Nurse Practitioner

## 2013-02-12 DIAGNOSIS — Z1231 Encounter for screening mammogram for malignant neoplasm of breast: Secondary | ICD-10-CM

## 2013-02-13 ENCOUNTER — Other Ambulatory Visit: Payer: Self-pay | Admitting: Nurse Practitioner

## 2013-02-13 DIAGNOSIS — R928 Other abnormal and inconclusive findings on diagnostic imaging of breast: Secondary | ICD-10-CM

## 2013-02-26 ENCOUNTER — Ambulatory Visit
Admission: RE | Admit: 2013-02-26 | Discharge: 2013-02-26 | Disposition: A | Discharge status: Home / Self Care | Payer: Medicare Other | Source: Ambulatory Visit | Attending: Nurse Practitioner | Admitting: Nurse Practitioner

## 2013-02-26 DIAGNOSIS — R928 Other abnormal and inconclusive findings on diagnostic imaging of breast: Secondary | ICD-10-CM

## 2013-07-04 ENCOUNTER — Other Ambulatory Visit: Payer: Self-pay | Admitting: Nurse Practitioner

## 2013-07-04 DIAGNOSIS — N63 Unspecified lump in unspecified breast: Secondary | ICD-10-CM

## 2013-08-07 ENCOUNTER — Ambulatory Visit
Admission: RE | Admit: 2013-08-07 | Discharge: 2013-08-07 | Disposition: A | Discharge status: Home / Self Care | Payer: Medicare Other | Source: Ambulatory Visit | Attending: Nurse Practitioner | Admitting: Nurse Practitioner

## 2013-08-07 DIAGNOSIS — N63 Unspecified lump in unspecified breast: Secondary | ICD-10-CM

## 2014-01-02 ENCOUNTER — Other Ambulatory Visit: Payer: Self-pay | Admitting: Family Medicine

## 2014-01-02 DIAGNOSIS — N6310 Unspecified lump in the right breast, unspecified quadrant: Secondary | ICD-10-CM

## 2014-02-05 ENCOUNTER — Ambulatory Visit
Admission: RE | Admit: 2014-02-05 | Discharge: 2014-02-05 | Disposition: A | Discharge status: Home / Self Care | Payer: Medicare Other | Source: Ambulatory Visit | Attending: Family Medicine | Admitting: Family Medicine

## 2014-02-05 ENCOUNTER — Other Ambulatory Visit: Payer: Self-pay | Admitting: Family Medicine

## 2014-02-05 DIAGNOSIS — N6310 Unspecified lump in the right breast, unspecified quadrant: Secondary | ICD-10-CM

## 2014-09-30 ENCOUNTER — Encounter: Payer: Self-pay | Admitting: Internal Medicine

## 2014-12-31 ENCOUNTER — Other Ambulatory Visit: Payer: Self-pay | Admitting: Family Medicine

## 2014-12-31 DIAGNOSIS — Z1231 Encounter for screening mammogram for malignant neoplasm of breast: Secondary | ICD-10-CM

## 2015-01-23 ENCOUNTER — Other Ambulatory Visit: Payer: Self-pay | Admitting: Gastroenterology

## 2015-01-29 ENCOUNTER — Encounter (HOSPITAL_COMMUNITY): Payer: Self-pay | Admitting: *Deleted

## 2015-01-30 ENCOUNTER — Ambulatory Visit (HOSPITAL_COMMUNITY): Payer: Medicare Other | Admitting: Anesthesiology

## 2015-01-30 ENCOUNTER — Encounter (HOSPITAL_COMMUNITY): Payer: Self-pay | Admitting: Anesthesiology

## 2015-01-30 ENCOUNTER — Encounter (HOSPITAL_COMMUNITY)
Admission: RE | Disposition: A | Discharge status: Home / Self Care | Payer: Self-pay | Source: Ambulatory Visit | Attending: Gastroenterology

## 2015-01-30 ENCOUNTER — Ambulatory Visit (HOSPITAL_COMMUNITY)
Admission: RE | Admit: 2015-01-30 | Discharge: 2015-01-30 | Disposition: A | Discharge status: Home / Self Care | Payer: Medicare Other | Source: Ambulatory Visit | Attending: Gastroenterology | Admitting: Gastroenterology

## 2015-01-30 DIAGNOSIS — E119 Type 2 diabetes mellitus without complications: Secondary | ICD-10-CM | POA: Diagnosis not present

## 2015-01-30 DIAGNOSIS — Z8673 Personal history of transient ischemic attack (TIA), and cerebral infarction without residual deficits: Secondary | ICD-10-CM | POA: Insufficient documentation

## 2015-01-30 DIAGNOSIS — Z9049 Acquired absence of other specified parts of digestive tract: Secondary | ICD-10-CM | POA: Diagnosis not present

## 2015-01-30 DIAGNOSIS — Z85038 Personal history of other malignant neoplasm of large intestine: Secondary | ICD-10-CM | POA: Insufficient documentation

## 2015-01-30 DIAGNOSIS — Z794 Long term (current) use of insulin: Secondary | ICD-10-CM | POA: Insufficient documentation

## 2015-01-30 DIAGNOSIS — D649 Anemia, unspecified: Secondary | ICD-10-CM | POA: Insufficient documentation

## 2015-01-30 DIAGNOSIS — M1990 Unspecified osteoarthritis, unspecified site: Secondary | ICD-10-CM | POA: Insufficient documentation

## 2015-01-30 DIAGNOSIS — G4730 Sleep apnea, unspecified: Secondary | ICD-10-CM | POA: Insufficient documentation

## 2015-01-30 DIAGNOSIS — Z1211 Encounter for screening for malignant neoplasm of colon: Secondary | ICD-10-CM | POA: Insufficient documentation

## 2015-01-30 DIAGNOSIS — Z791 Long term (current) use of non-steroidal anti-inflammatories (NSAID): Secondary | ICD-10-CM | POA: Diagnosis not present

## 2015-01-30 DIAGNOSIS — K219 Gastro-esophageal reflux disease without esophagitis: Secondary | ICD-10-CM | POA: Insufficient documentation

## 2015-01-30 DIAGNOSIS — I509 Heart failure, unspecified: Secondary | ICD-10-CM | POA: Diagnosis not present

## 2015-01-30 DIAGNOSIS — I252 Old myocardial infarction: Secondary | ICD-10-CM | POA: Insufficient documentation

## 2015-01-30 DIAGNOSIS — Z9989 Dependence on other enabling machines and devices: Secondary | ICD-10-CM | POA: Insufficient documentation

## 2015-01-30 DIAGNOSIS — Z6841 Body Mass Index (BMI) 40.0 and over, adult: Secondary | ICD-10-CM | POA: Diagnosis not present

## 2015-01-30 DIAGNOSIS — D123 Benign neoplasm of transverse colon: Secondary | ICD-10-CM | POA: Insufficient documentation

## 2015-01-30 DIAGNOSIS — I10 Essential (primary) hypertension: Secondary | ICD-10-CM | POA: Insufficient documentation

## 2015-01-30 HISTORY — DX: Essential (primary) hypertension: I10

## 2015-01-30 HISTORY — PX: COLONOSCOPY WITH PROPOFOL: SHX5780

## 2015-01-30 HISTORY — DX: Gastro-esophageal reflux disease without esophagitis: K21.9

## 2015-01-30 HISTORY — DX: Unspecified osteoarthritis, unspecified site: M19.90

## 2015-01-30 HISTORY — DX: Headache, unspecified: R51.9

## 2015-01-30 HISTORY — DX: Malignant (primary) neoplasm, unspecified: C80.1

## 2015-01-30 HISTORY — DX: Headache: R51

## 2015-01-30 HISTORY — DX: Anemia, unspecified: D64.9

## 2015-01-30 HISTORY — DX: Sleep apnea, unspecified: G47.30

## 2015-01-30 HISTORY — DX: Type 2 diabetes mellitus without complications: E11.9

## 2015-01-30 LAB — GLUCOSE, CAPILLARY: Glucose-Capillary: 128 mg/dL — ABNORMAL HIGH (ref 70–99)

## 2015-01-30 SURGERY — COLONOSCOPY WITH PROPOFOL
Anesthesia: Monitor Anesthesia Care

## 2015-01-30 MED ORDER — PROPOFOL INFUSION 10 MG/ML OPTIME
INTRAVENOUS | Status: DC | PRN
  Administered 2015-01-30: 100 ug/kg/min via INTRAVENOUS

## 2015-01-30 MED ORDER — LACTATED RINGERS IV SOLN
INTRAVENOUS | Status: DC
  Administered 2015-01-30: 10001 mL via INTRAVENOUS

## 2015-01-30 MED ORDER — SODIUM CHLORIDE 0.9 % IV SOLN: INTRAVENOUS | Status: DC

## 2015-01-30 MED ORDER — PROPOFOL 10 MG/ML IV BOLUS
INTRAVENOUS | Status: AC
  Filled 2015-01-30: qty 20

## 2015-01-30 MED ORDER — PROPOFOL 10 MG/ML IV BOLUS
INTRAVENOUS | Status: DC | PRN
  Administered 2015-01-30: 50 mg via INTRAVENOUS
  Administered 2015-01-30: 20 mg via INTRAVENOUS

## 2015-01-30 SURGICAL SUPPLY — 22 items

## 2015-01-30 NOTE — Op Note (Signed)
[facility] [place], [id number]   OPERATIVE PROCEDURE REPORT  PATIENT: [name] [name], [name] [name]  MR#: [id number] BIRTHDATE: 07-02-1946 GENDER: female ENDOSCOPIST: Edmonia James, MD ASSISTANT:   Cleda Daub, RN & Cristopher Estimable, technician.Marland Kitchen PROCEDURE DATE: Feb 14, 2015 PRE-PROCEDURE PREPARATION: The patient was prepped with a gallon of Golytely the night prior to the procedure. The patient has fasted for 4 hours prior to the procedure Patient fasted for 4 hours prior to procedure. PRE-PROCEDURE PHYSICAL: Patient has stable vital signs.  Neck is supple.  There is no JVD, thyromegaly or LAD.  Chest clear to auscultation.  S1 and S2 regular; systolic murmur noted.  Abdomen soft, morbidly obese, non-distended, non-tender with NABS. PROCEDURE:     Colonoscopy with hot snare polypectomy x 2. ASA CLASS:     Class IV INDICATIONS:     1.  CRC screening-high risk patient with personal history of colon cancer-s/p right hemicolectomy. MEDICATIONS:     Monitored anesthesia care.  DESCRIPTION OF PROCEDURE: After the risks, benefits, and alternatives of the procedure were thoroughly explained [including a 10% missed rate of cancer and polyps], informed consent was obtained.  Digital rectal exam was performed.  The Pentax video colonoscope [id number]  was introduced through the anus  and advanced to the surgical anastomosis No adverse events experienced.   The quality of the prep was good. Multiple washes were done. Small lesions could be missed. The instrument was then slowly withdrawn as the colon was fully examined.     COLON FINDINGS: One sessile polyp was found in the proximal transverse colon;  polypectomy was performed x 2; resection was complete, the polyp tissue was completely retrieved and sent to histology. The rest of the colonic mucosa appeared healthy with a normal vascular pattern. No masses, diverticula or AVMs were noted. The appendiceal orifice and  the ICV were identified and photographed.  The terminal ileum appeared normal.  Retroflexed views revealed no abnormalities.  The patient tolerated the procedure without immediate complications.  The scope was then withdrawn from the patient and the procedure terminated.  TIME TO CECUM:  Not calculated as the patient has had a right hemicolectomy. WITHDRAW TIME:  Not calculated.  IMPRESSION:     One sessile polyp were found in the proximal transverse colon; polypectomy was performed x 2; otherwise normal exam upto the anastamosis; patient has had a right hemicolectomy.   RECOMMENDATIONS:     1.  Hold Aspirin and all other NSAIDS for 2 weeks. 2.  Continue current medications. 3.  Continue surveillance. 4.  High fiber diet with liberal fluid intake. 5.  OP follow-up is advised on a PRN basis. 6. CARDIOLOGY EVALUATION ASAP.  REPEAT EXAM:      In 5 years  for a repat colonoscopy.  If the patient has any abnormal GI symptoms in the interim, she have been advised to contact the office as soon as possible for further recommendations.   REFERRED QI:ONGEXB Anderson, F.N.P.-B.C.  eSignedEdmonia James, MD Feb 14, 2015 8:15 AM   CPT CODES:     [phone] Colonoscopy, flexible, proximal to splenic flexure; with removal of tumor(s), polyp(s), or other lesion(s) by snare technique ICD CODES:     D12.3 Benign neoplasm of the large intestine /Z85.038 Personal history of other malignant neoplasm of large intestine Z12.11 Colorectal cancer screening  The ICD and CPT codes recommended by this software are interpretations from the data that the clinical staff has captured with the software.  The verification of  the translation of this report to the ICD and CPT codes and modifiers is the sole responsibility of the health care institution and practicing physician where this report was generated.  [employer] will not be held responsible for the validity of the ICD and CPT codes  included on this report.  AMA assumes no liability for data contained or not contained herein. CPT is a Designer, television/film set of the [employer].  PATIENT NAME:  [name] [name], [name] [name] MR#: [id number]

## 2015-01-30 NOTE — Discharge Instructions (Signed)

## 2015-01-30 NOTE — Anesthesia Preprocedure Evaluation (Signed)
Anesthesia Evaluation  Patient identified by MRN, date of birth, ID band Patient awake    Reviewed: Allergy & Precautions, NPO status , Patient's Chart, lab work & pertinent test results  History of Anesthesia Complications Negative for: history of anesthetic complications  Airway Mallampati: II  TM Distance: >3 FB     Dental  (+) Edentulous Upper,    Pulmonary shortness of breath and at rest, sleep apnea and Continuous Positive Airway Pressure Ventilation , former smoker,          Cardiovascular METS: < 3 Mets hypertension, Pt. on medications + DOE - Past MI and - CHF Rhythm:Regular + Systolic murmurs    Neuro/Psych  Headaches,    GI/Hepatic GERD-  Medicated and Controlled,  Endo/Other  diabetes, Type 2, Insulin DependentMorbid obesity  Renal/GU      Musculoskeletal  (+) Arthritis -,   Abdominal   Peds  Hematology   Anesthesia Other Findings   Reproductive/Obstetrics                             Anesthesia Physical Anesthesia Plan  ASA: III  Anesthesia Plan: MAC   Post-op Pain Management:    Induction: Intravenous  Airway Management Planned: Natural Airway  Additional Equipment:   Intra-op Plan:   Post-operative Plan:   Informed Consent: I have reviewed the patients History and Physical, chart, labs and discussed the procedure including the risks, benefits and alternatives for the proposed anesthesia with the patient or authorized representative who has indicated his/her understanding and acceptance.   Dental advisory given  Plan Discussed with: CRNA and Surgeon  Anesthesia Plan Comments:         Anesthesia Quick Evaluation

## 2015-01-30 NOTE — H&P (Addendum)
[name] [name] is an 69 y.o. female.   Chief Complaint: Colorectal cancer screening. HPI: 69 year old white female, s/p right hemicolectomy in 2012 for colon cancer presents to the hospital for a repeat colonoscopy. She denies having any active GI issues at this time. See office notes for further details.   Past Medical History  Diagnosis Date  . Hypertension   . Sleep apnea     has cpap, does not use cpap on regular basis  . Diabetes mellitus without complication   . GERD (gastroesophageal reflux disease)   . Headache   . Arthritis   . Cancer 2012    colon  . Anemia      Morbid obesity  Past Surgical History  Procedure Laterality Date  . Right hemicolectomy  2012    no chemo or radiation done  . Joint replacement      left hip replacement x 2  . Left wrist surgery for fracture     . Right hand cortisone injection      cast put on  . Abdominal hysterectomy      partial   History reviewed. No pertinent family history. Social History:  reports that she quit smoking about 50 years ago. Her smoking use included Cigarettes. She has a 2.5 pack-year smoking history. She has never used smokeless tobacco. She reports that she does not drink alcohol or use illicit drugs.  Allergies:  Allergies  Allergen Reactions  . Codeine     headache  . Oxycodone-Acetaminophen Nausea And Vomiting   Medications Prior to Admission  Medication Sig Dispense Refill  . Calcium Carb-Cholecalciferol (CALCIUM 600 + D PO) Take 1 tablet by mouth daily.    . famotidine (PEPCID) 40 MG tablet Take 40 mg by mouth 2 (two) times daily.  3  . ferrous sulfate 325 (65 FE) MG tablet Take 325 mg by mouth 3 (three) times daily with meals.    . hydrochlorothiazide (HYDRODIURIL) 25 MG tablet Take 25 mg by mouth every morning.   5  . ibuprofen (ADVIL,MOTRIN) 200 MG tablet Take 200 mg by mouth every 6 (six) hours as needed for mild pain.    Marland Kitchen insulin NPH-regular Human (NOVOLIN 70/30) (70-30) 100 UNIT/ML injection  Inject 24-47 Units into the skin 2 (two) times daily with a meal.    . losartan (COZAAR) 100 MG tablet Take 100 mg by mouth at bedtime.   11  . metFORMIN (GLUCOPHAGE) 1000 MG tablet Take 1,000 mg by mouth 2 (two) times daily with a meal.  3  . simvastatin (ZOCOR) 80 MG tablet Take 80 mg by mouth at bedtime.  3   No results found for this or any previous visit (from the past 48 hour(s)). No results found.  Review of Systems  Constitutional: Negative.   HENT: Negative.   Eyes: Negative.   Respiratory: Negative.   Cardiovascular: Negative.   Gastrointestinal: Negative.   Genitourinary: Negative.   Musculoskeletal: Positive for back pain and joint pain.  Skin: Negative.   Neurological: Negative.   Endo/Heme/Allergies: Negative.   Psychiatric/Behavioral: Negative.    There were no vitals taken for this visit. Physical Exam  Constitutional: She is oriented to person, place, and time.  Morbidly obese  HENT:  Head: Normocephalic and atraumatic.  Eyes: Conjunctivae and EOM are normal. Pupils are equal, round, and reactive to light.  Neck: Normal range of motion. Neck supple.  Cardiovascular: Normal rate.   Murmur heard.  Systolic murmur is present with a grade of  3/6  Respiratory: Effort normal and breath sounds normal.  GI: Soft. Bowel sounds are normal.  Neurological: She is alert and oriented to person, place, and time.  Skin: Skin is warm and dry.  Psychiatric: She has a normal mood and affect. Her behavior is normal. Judgment and thought content normal.    Assessment/Plan Colorectal cancer screening-s/p right hemicloectomy  [name] [name] 01/30/2015, 7:20 AM

## 2015-01-30 NOTE — Transfer of Care (Signed)
Immediate Anesthesia Transfer of Care Note  Patient: [name] [name]  Procedure(s) Performed: Procedure(s) (LRB): COLONOSCOPY WITH PROPOFOL (N/A)  Patient Location: PACU  Anesthesia Type: MAC  Level of Consciousness: sedated, patient cooperative and responds to stimulation  Airway & Oxygen Therapy: Patient Spontanous Breathing and Patient connected to face mask oxgen  Post-op Assessment: Report given to PACU RN and Post -op Vital signs reviewed and stable  Post vital signs: Reviewed and stable  Complications: No apparent anesthesia complications

## 2015-01-30 NOTE — Anesthesia Postprocedure Evaluation (Signed)
  Anesthesia Post-op Note  Patient: [name] [name]  Procedure(s) Performed: Procedure(s): COLONOSCOPY WITH PROPOFOL (N/A)  Patient Location: PACU  Anesthesia Type:MAC  Level of Consciousness: awake and alert   Airway and Oxygen Therapy: Patient Spontanous Breathing  Post-op Pain: none  Post-op Assessment: Post-op Vital signs reviewed, Patient's Cardiovascular Status Stable, Respiratory Function Stable, Patent Airway, No signs of Nausea or vomiting and Pain level controlled  Post-op Vital Signs: Reviewed and stable  Last Vitals:  Filed Vitals:   01/30/15 0810  BP:   Pulse:   Temp: 36.6 C  Resp:     Complications: No apparent anesthesia complications. Patient with systolic murmur and no recent cardiac evaluation. Patient advised to follow up primary to arrange Cardiac eval as an outpatient.

## 2015-01-31 ENCOUNTER — Encounter (HOSPITAL_COMMUNITY): Payer: Self-pay | Admitting: Gastroenterology

## 2015-02-07 ENCOUNTER — Ambulatory Visit
Admission: RE | Admit: 2015-02-07 | Discharge: 2015-02-07 | Disposition: A | Discharge status: Home / Self Care | Payer: Medicare Other | Source: Ambulatory Visit | Attending: Family Medicine | Admitting: Family Medicine

## 2015-02-07 DIAGNOSIS — Z1231 Encounter for screening mammogram for malignant neoplasm of breast: Secondary | ICD-10-CM

## 2015-02-10 ENCOUNTER — Other Ambulatory Visit (HOSPITAL_COMMUNITY): Payer: Self-pay | Admitting: Cardiology

## 2015-02-10 DIAGNOSIS — R079 Chest pain, unspecified: Secondary | ICD-10-CM

## 2015-02-21 ENCOUNTER — Encounter (HOSPITAL_COMMUNITY)
Admission: RE | Admit: 2015-02-21 | Discharge: 2015-02-21 | Disposition: A | Discharge status: Home / Self Care | Payer: Medicare Other | Source: Ambulatory Visit | Attending: Cardiology | Admitting: Cardiology

## 2015-02-21 ENCOUNTER — Ambulatory Visit (HOSPITAL_COMMUNITY)
Admission: RE | Admit: 2015-02-21 | Discharge: 2015-02-21 | Disposition: A | Discharge status: Home / Self Care | Payer: Medicare Other | Source: Ambulatory Visit | Attending: Cardiology | Admitting: Cardiology

## 2015-02-21 DIAGNOSIS — D649 Anemia, unspecified: Secondary | ICD-10-CM | POA: Insufficient documentation

## 2015-02-21 DIAGNOSIS — R079 Chest pain, unspecified: Secondary | ICD-10-CM

## 2015-02-21 DIAGNOSIS — E119 Type 2 diabetes mellitus without complications: Secondary | ICD-10-CM | POA: Insufficient documentation

## 2015-02-21 DIAGNOSIS — I10 Essential (primary) hypertension: Secondary | ICD-10-CM | POA: Insufficient documentation

## 2015-02-21 DIAGNOSIS — R0602 Shortness of breath: Secondary | ICD-10-CM | POA: Insufficient documentation

## 2015-02-21 MED ORDER — REGADENOSON 0.4 MG/5ML IV SOLN
0.4000 mg | Freq: Once | INTRAVENOUS | Status: AC
  Administered 2015-02-21: 0.4 mg via INTRAVENOUS

## 2015-02-21 MED ORDER — TECHNETIUM TC 99M SESTAMIBI GENERIC - CARDIOLITE
10.0000 | Freq: Once | INTRAVENOUS | Status: AC | PRN
  Administered 2015-02-21: 10 via INTRAVENOUS

## 2015-02-21 MED ORDER — TECHNETIUM TC 99M SESTAMIBI - CARDIOLITE: 30.0000 | Freq: Once | INTRAVENOUS | Status: AC | PRN

## 2015-02-21 MED ORDER — TECHNETIUM TC 99M SESTAMIBI GENERIC - CARDIOLITE
30.0000 | Freq: Once | INTRAVENOUS | Status: AC | PRN
  Administered 2015-02-21: 30 via INTRAVENOUS

## 2015-02-21 MED ORDER — REGADENOSON 0.4 MG/5ML IV SOLN
INTRAVENOUS | Status: AC
  Filled 2015-02-21: qty 5

## 2016-01-06 ENCOUNTER — Other Ambulatory Visit: Payer: Self-pay

## 2016-01-06 DIAGNOSIS — Z1231 Encounter for screening mammogram for malignant neoplasm of breast: Secondary | ICD-10-CM

## 2016-02-10 ENCOUNTER — Ambulatory Visit
Admission: RE | Admit: 2016-02-10 | Discharge: 2016-02-10 | Disposition: A | Discharge status: Home / Self Care | Payer: Medicare Other | Source: Ambulatory Visit

## 2016-02-10 DIAGNOSIS — Z1231 Encounter for screening mammogram for malignant neoplasm of breast: Secondary | ICD-10-CM

## 2016-02-11 ENCOUNTER — Other Ambulatory Visit: Payer: Self-pay | Admitting: Nurse Practitioner

## 2016-02-11 DIAGNOSIS — E2839 Other primary ovarian failure: Secondary | ICD-10-CM

## 2016-02-16 ENCOUNTER — Other Ambulatory Visit: Payer: Medicare Other

## 2016-03-04 ENCOUNTER — Ambulatory Visit
Admission: RE | Admit: 2016-03-04 | Discharge: 2016-03-04 | Disposition: A | Discharge status: Home / Self Care | Payer: Medicare Other | Source: Ambulatory Visit | Attending: Nurse Practitioner | Admitting: Nurse Practitioner

## 2016-03-04 ENCOUNTER — Other Ambulatory Visit: Payer: Medicare Other

## 2016-03-04 DIAGNOSIS — E2839 Other primary ovarian failure: Secondary | ICD-10-CM

## 2017-01-05 ENCOUNTER — Other Ambulatory Visit: Payer: Self-pay | Admitting: Nurse Practitioner

## 2017-01-05 DIAGNOSIS — Z1231 Encounter for screening mammogram for malignant neoplasm of breast: Secondary | ICD-10-CM

## 2017-02-10 ENCOUNTER — Ambulatory Visit
Admission: RE | Admit: 2017-02-10 | Discharge: 2017-02-10 | Disposition: A | Discharge status: Home / Self Care | Payer: Medicare Other | Source: Ambulatory Visit | Attending: Nurse Practitioner | Admitting: Nurse Practitioner

## 2017-02-10 DIAGNOSIS — Z1231 Encounter for screening mammogram for malignant neoplasm of breast: Secondary | ICD-10-CM

## 2018-01-04 ENCOUNTER — Other Ambulatory Visit: Payer: Self-pay | Admitting: Nurse Practitioner

## 2018-01-04 DIAGNOSIS — Z1231 Encounter for screening mammogram for malignant neoplasm of breast: Secondary | ICD-10-CM

## 2018-01-16 ENCOUNTER — Ambulatory Visit: Payer: Medicare Other

## 2018-02-13 ENCOUNTER — Ambulatory Visit
Admission: RE | Admit: 2018-02-13 | Discharge: 2018-02-13 | Disposition: A | Discharge status: Home / Self Care | Payer: Medicare Other | Source: Ambulatory Visit | Attending: Nurse Practitioner | Admitting: Nurse Practitioner

## 2018-02-13 DIAGNOSIS — Z1231 Encounter for screening mammogram for malignant neoplasm of breast: Secondary | ICD-10-CM

## 2018-07-28 ENCOUNTER — Emergency Department (HOSPITAL_COMMUNITY)
Admission: EM | Admit: 2018-07-28 | Discharge: 2018-07-29 | Disposition: A | Discharge status: Home / Self Care | Payer: Medicare Other | Attending: Emergency Medicine | Admitting: Emergency Medicine

## 2018-07-28 ENCOUNTER — Other Ambulatory Visit: Payer: Self-pay

## 2018-07-28 ENCOUNTER — Encounter (HOSPITAL_COMMUNITY): Payer: Self-pay | Admitting: Emergency Medicine

## 2018-07-28 DIAGNOSIS — Z96642 Presence of left artificial hip joint: Secondary | ICD-10-CM | POA: Diagnosis not present

## 2018-07-28 DIAGNOSIS — Y999 Unspecified external cause status: Secondary | ICD-10-CM | POA: Insufficient documentation

## 2018-07-28 DIAGNOSIS — Y929 Unspecified place or not applicable: Secondary | ICD-10-CM | POA: Insufficient documentation

## 2018-07-28 DIAGNOSIS — Z794 Long term (current) use of insulin: Secondary | ICD-10-CM | POA: Diagnosis not present

## 2018-07-28 DIAGNOSIS — Z79899 Other long term (current) drug therapy: Secondary | ICD-10-CM | POA: Diagnosis not present

## 2018-07-28 DIAGNOSIS — Z87891 Personal history of nicotine dependence: Secondary | ICD-10-CM | POA: Insufficient documentation

## 2018-07-28 DIAGNOSIS — S76011A Strain of muscle, fascia and tendon of right hip, initial encounter: Secondary | ICD-10-CM | POA: Insufficient documentation

## 2018-07-28 DIAGNOSIS — I10 Essential (primary) hypertension: Secondary | ICD-10-CM | POA: Diagnosis not present

## 2018-07-28 DIAGNOSIS — X58XXXA Exposure to other specified factors, initial encounter: Secondary | ICD-10-CM | POA: Diagnosis not present

## 2018-07-28 DIAGNOSIS — Y939 Activity, unspecified: Secondary | ICD-10-CM | POA: Diagnosis not present

## 2018-07-28 DIAGNOSIS — E119 Type 2 diabetes mellitus without complications: Secondary | ICD-10-CM | POA: Diagnosis not present

## 2018-07-28 NOTE — ED Triage Notes (Signed)
Pt presents to ED for assessment of right groin pain, worse with getting out of the car or the bed.  Patient states pain x 2 months.  Denies known injury.  Denies pain with walking.

## 2018-07-28 NOTE — ED Provider Notes (Signed)
Patient placed in Quick Look pathway, seen and evaluated   Chief Complaint: groin pain  HPI:   [name] [name] is a 72 y.o. female who presents to the ED with right groin pain that started about 2 months ago and has gotten worse. The pain worsens when patient gets in and out of car and bed. Denies increased pain with ambulation.  ROS: M/S: right groin pain  Physical Exam:  BP 133/83 (BP Location: Right Arm)   Pulse (!) 123   Temp 98.3 F (36.8 C) (Oral)   Resp 17   SpO2 100%     Gen: No distress  Neuro: Awake and Alert  Skin: Warm and dry  Heart: tachycarida  Initiation of care has begun. The patient has been counseled on the process, plan, and necessity for staying for the completion/evaluation, and the remainder of the medical screening examination    Ashley Murrain, NP 07/31/18 [place]    Jola Schmidt, MD 07/31/18 2312

## 2018-07-29 ENCOUNTER — Emergency Department (HOSPITAL_COMMUNITY): Payer: Medicare Other

## 2018-07-29 DIAGNOSIS — S76011A Strain of muscle, fascia and tendon of right hip, initial encounter: Secondary | ICD-10-CM | POA: Diagnosis not present

## 2018-07-29 LAB — URINALYSIS, ROUTINE W REFLEX MICROSCOPIC
Bilirubin Urine: NEGATIVE
Glucose, UA: NEGATIVE mg/dL
Ketones, ur: 20 mg/dL — AB
Leukocytes, UA: NEGATIVE
Nitrite: NEGATIVE
Protein, ur: NEGATIVE mg/dL
Specific Gravity, Urine: 1.017 (ref 1.005–1.030)
pH: 5 (ref 5.0–8.0)

## 2018-07-29 LAB — BASIC METABOLIC PANEL
Anion gap: 10 (ref 5–15)
BUN: 12 mg/dL (ref 8–23)
CO2: 26 mmol/L (ref 22–32)
Calcium: 9.4 mg/dL (ref 8.9–10.3)
Chloride: 102 mmol/L (ref 98–111)
Creatinine, Ser: 0.75 mg/dL (ref 0.44–1.00)
GFR calc Af Amer: >60 mL/min (ref >60)
GFR calc non Af Amer: >60 mL/min (ref >60)
Glucose, Bld: 135 mg/dL — ABNORMAL HIGH (ref 70–99)
Potassium: 3.5 mmol/L (ref 3.5–5.1)
Sodium: 138 mmol/L (ref 135–145)

## 2018-07-29 LAB — CBC
HCT: 36 % (ref 36.0–46.0)
Hemoglobin: 11.6 g/dL — ABNORMAL LOW (ref 12.0–15.0)
MCH: 32.9 pg (ref 26.0–34.0)
MCHC: 32.2 g/dL (ref 30.0–36.0)
MCV: 102 fL — ABNORMAL HIGH (ref 78.0–100.0)
Platelets: 193 10*3/uL (ref 150–400)
RBC: 3.53 MIL/uL — ABNORMAL LOW (ref 3.87–5.11)
RDW: 12.7 % (ref 11.5–15.5)
WBC: 9.6 10*3/uL (ref 4.0–10.5)

## 2018-07-29 IMAGING — CT CT HIP*R* W/O CM
2 of 3 series · 17 of 46 positions shown, 19 images · non-contrast
Comparison: Right knee radiograph dated [DATE]

CLINICAL DATA: 72-year-old female with right hip pain.

EXAM:
CT OF THE RIGHT HIP WITHOUT CONTRAST
TECHNIQUE: Multidetector CT imaging of the right hip was performed according to
the standard protocol. Multiplanar CT image reconstructions were
also generated.

[Series 3: pelvis 2.0 (person_name) (person_name) · axial · 0.50mm/px · z∈[-516,-316]mm · 14 of 116 slices shown, 16 images]
[im 8/116  soft-tissue]
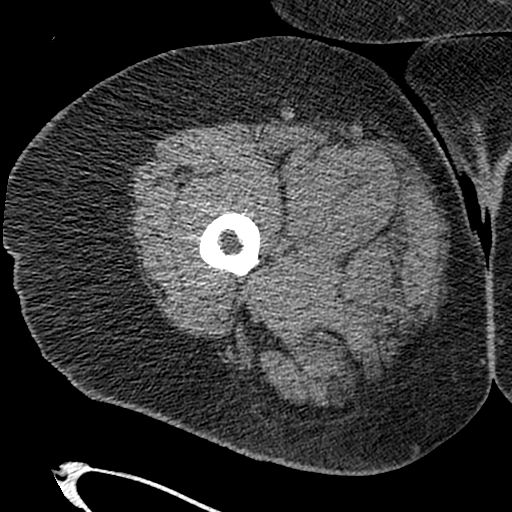
[im 8/116  bone]
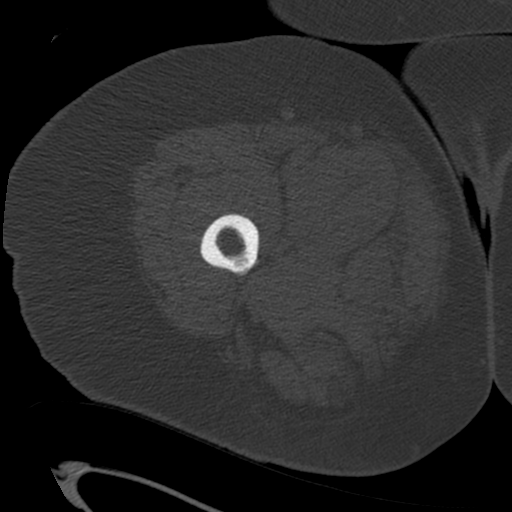
[im 15/116  soft-tissue]
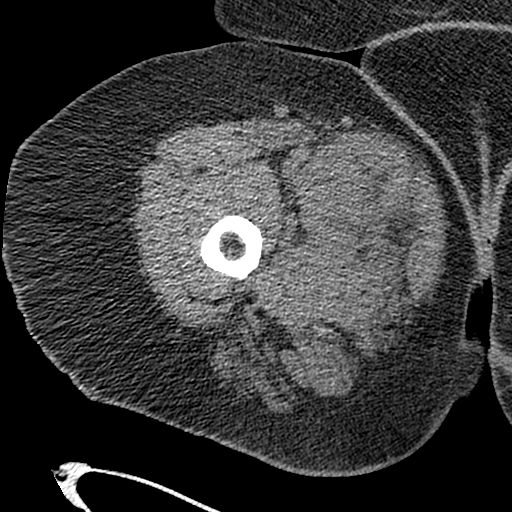
[im 23/116  soft-tissue]
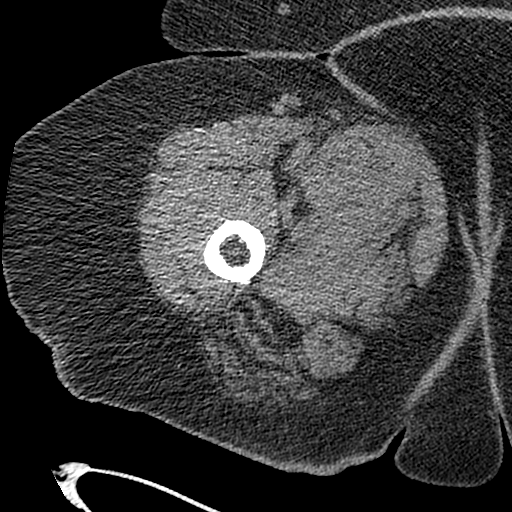
[im 30/116  soft-tissue]
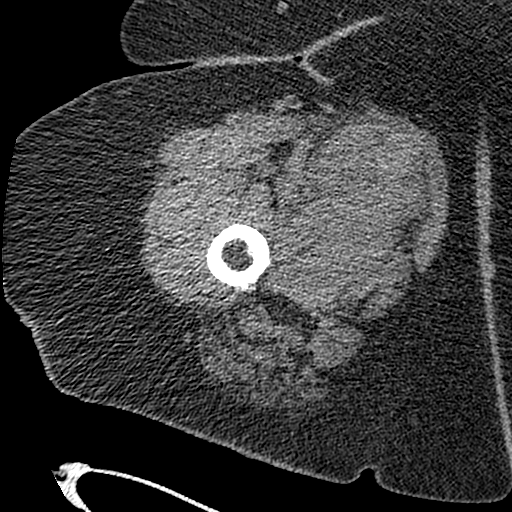
[im 38/116  soft-tissue]
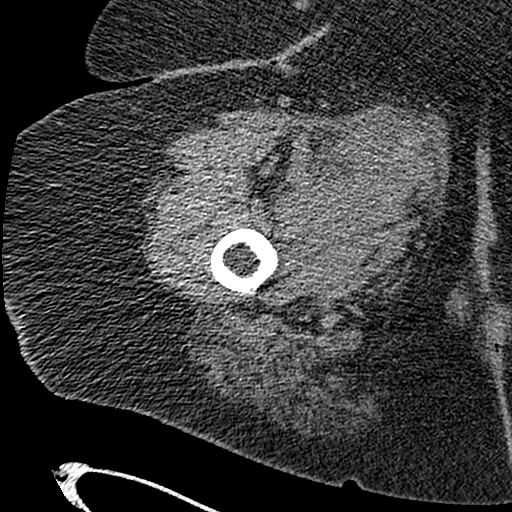
[im 45/116  soft-tissue]
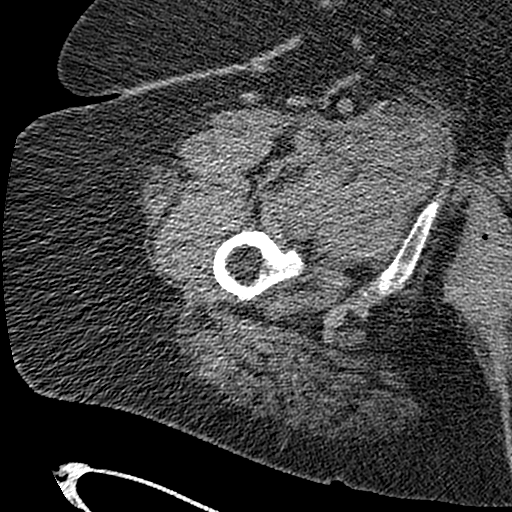
[im 52/116  soft-tissue]
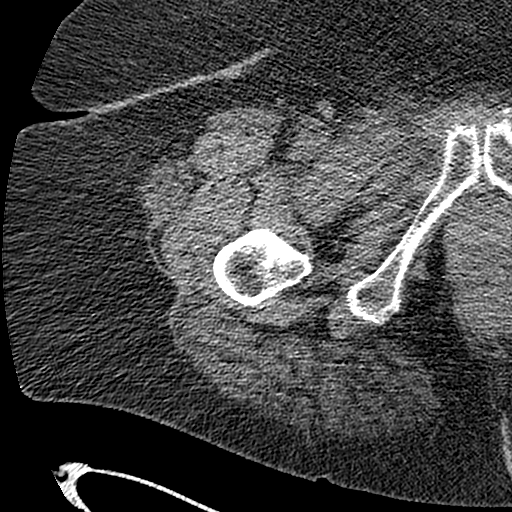
[im 64/116  soft-tissue]
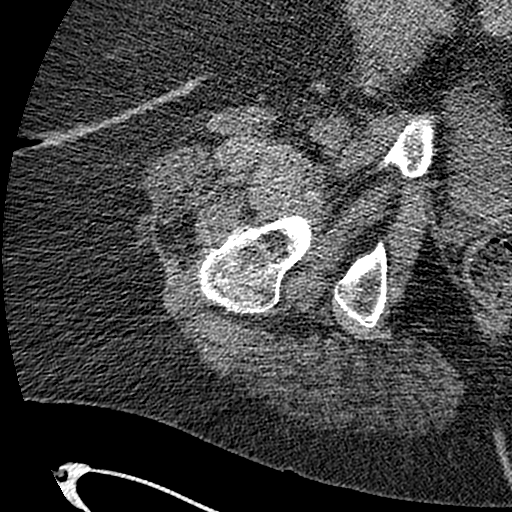
[im 71/116  soft-tissue]
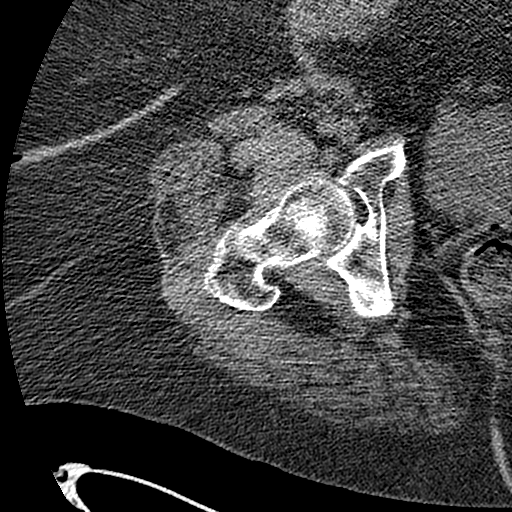
[im 71/116  bone]
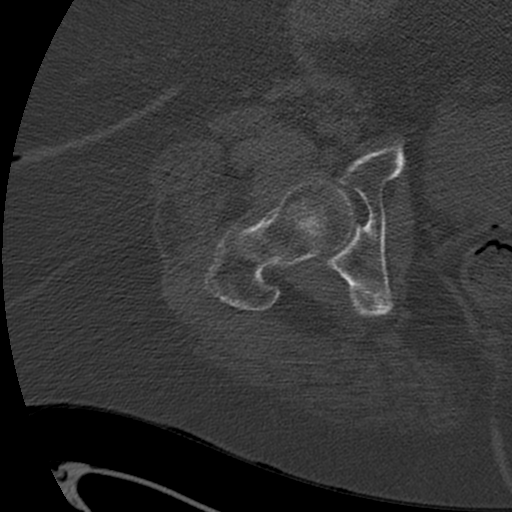
[im 78/116  soft-tissue]
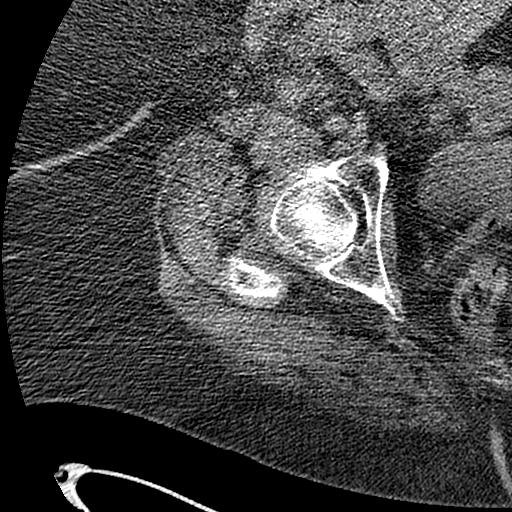
[im 86/116  soft-tissue]
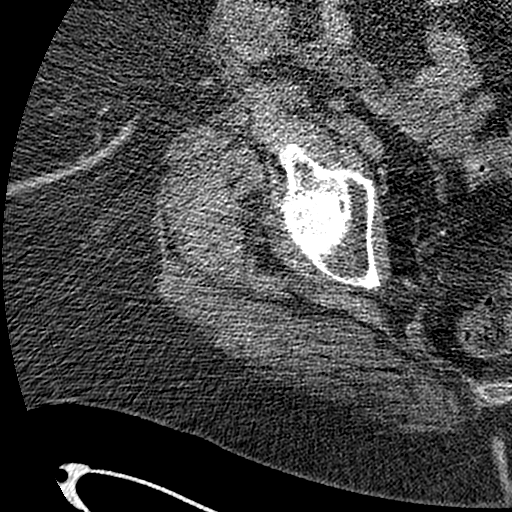
[im 93/116  soft-tissue]
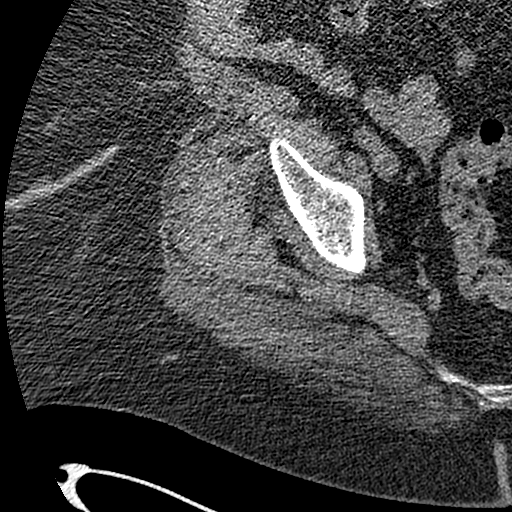
[im 101/116  soft-tissue]
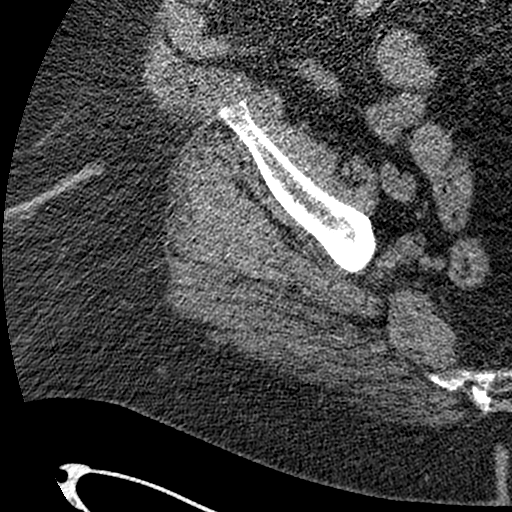
[im 108/116  soft-tissue]
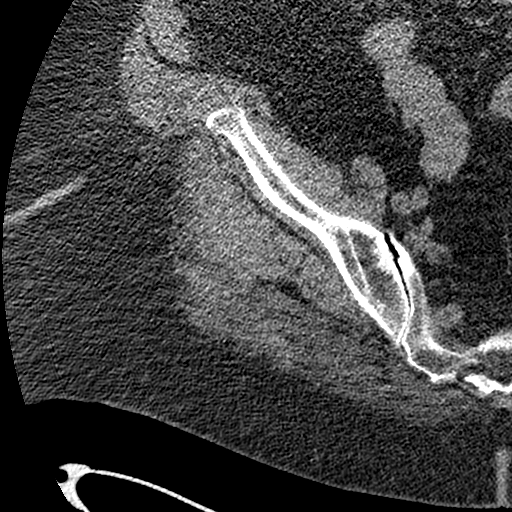

[Series 8: coronal st · coronal · 0.45mm/px · 3 of 163 slices shown]
[im 55/163  soft-tissue]
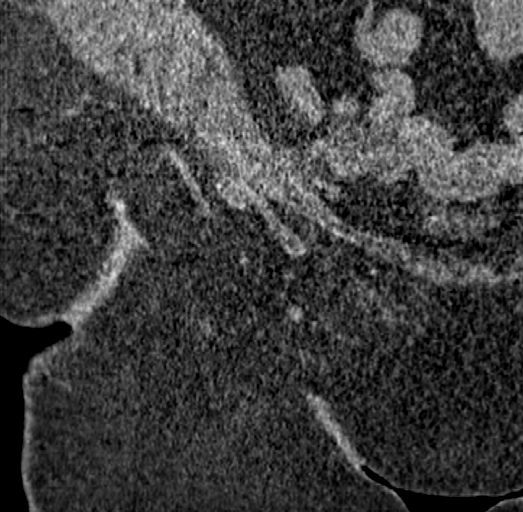
[im 73/163  soft-tissue]
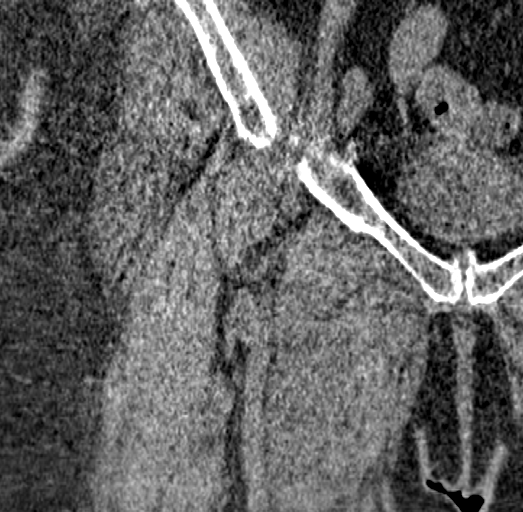
[im 91/163  soft-tissue]
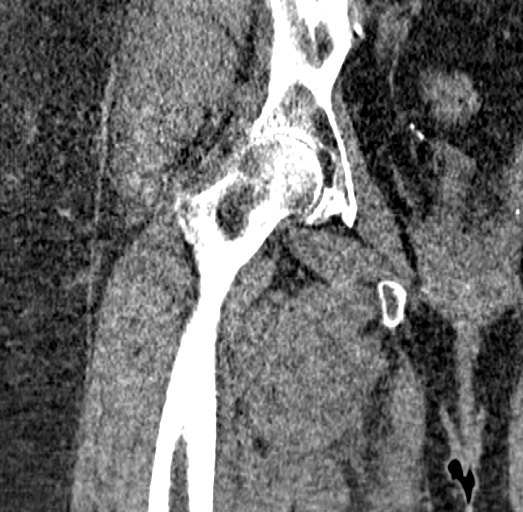

[17 of 46 positions shown; findings below may reference images not displayed]

FINDINGS: Evaluation of the bones is limited due to osteopenia as well as
artifact caused by patient's body habitus.

Bones/Joint/Cartilage

No definite or displaced fracture identified. No significant
arthritic changes. No effusion.

Ligaments

Suboptimally assessed by CT.

Muscles and Tendons

There is stranding of the musculature and fascia of the medial
compartment of the proximal right thigh. There is a linear lucency
in the musculature and the adductor group (series 3 images 94-115)
which may represent a partial muscular tear. No large hematoma.

Soft tissues

No fluid collection.
IMPRESSION: 1. No definite acute fracture or dislocation.
2. Possible partial tear of the adductor musculature of the right
thigh. No hematoma.

## 2018-07-29 IMAGING — DX DG HIP (WITH OR WITHOUT PELVIS) 2-3V*R*
3 series · 3 of 3 positions shown · non-contrast
Comparison: None.

CLINICAL DATA: 72-year-old female with right hip pain. No known
injury.

EXAM:
DG HIP (WITH OR WITHOUT PELVIS) 2-3V RIGHT

[hip ap]
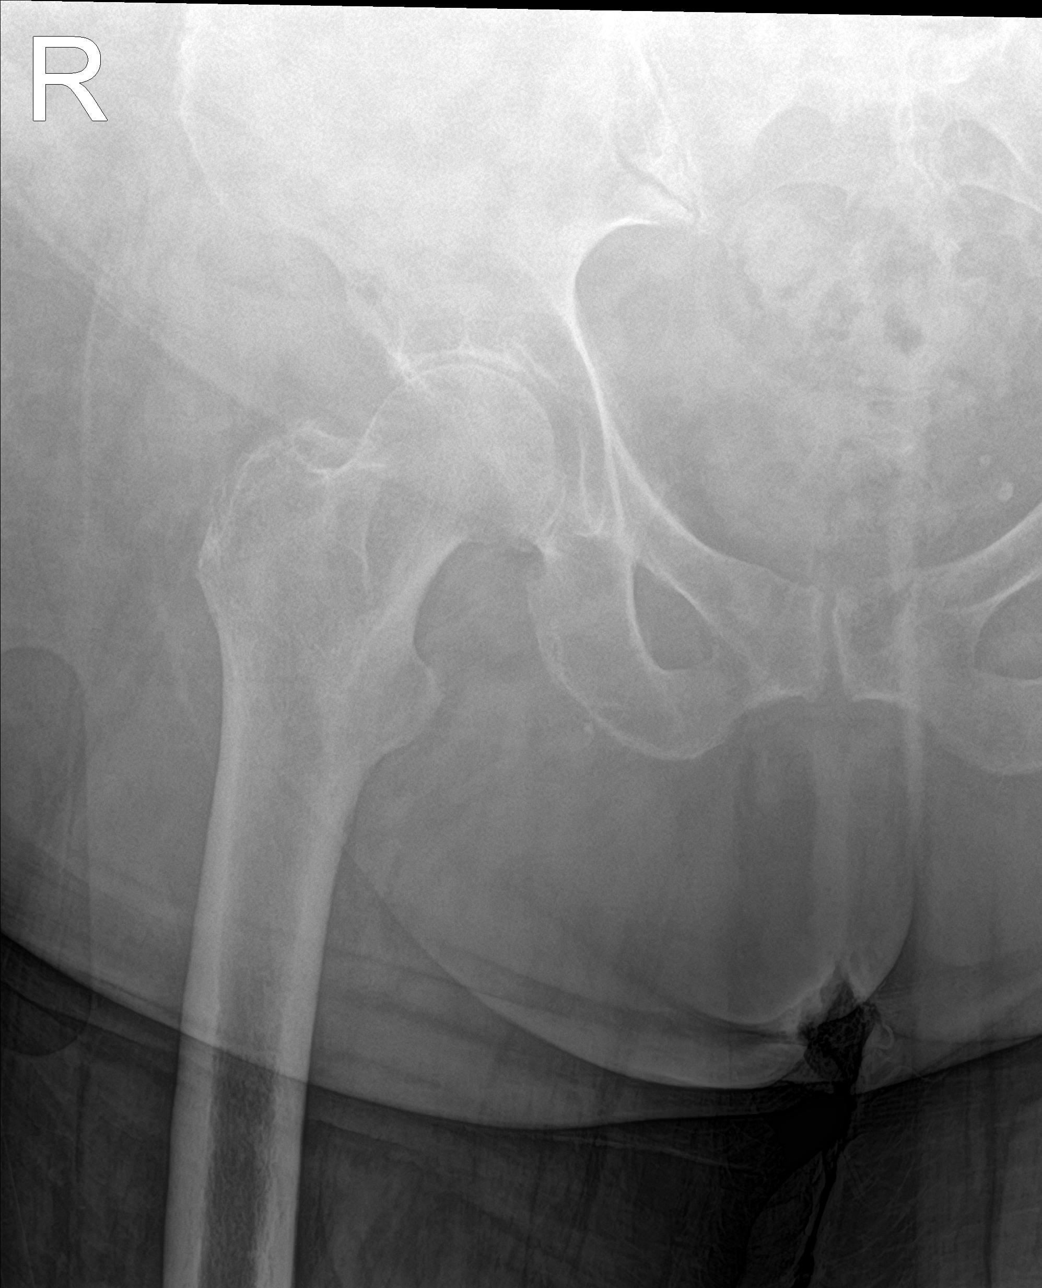

[hip lat]
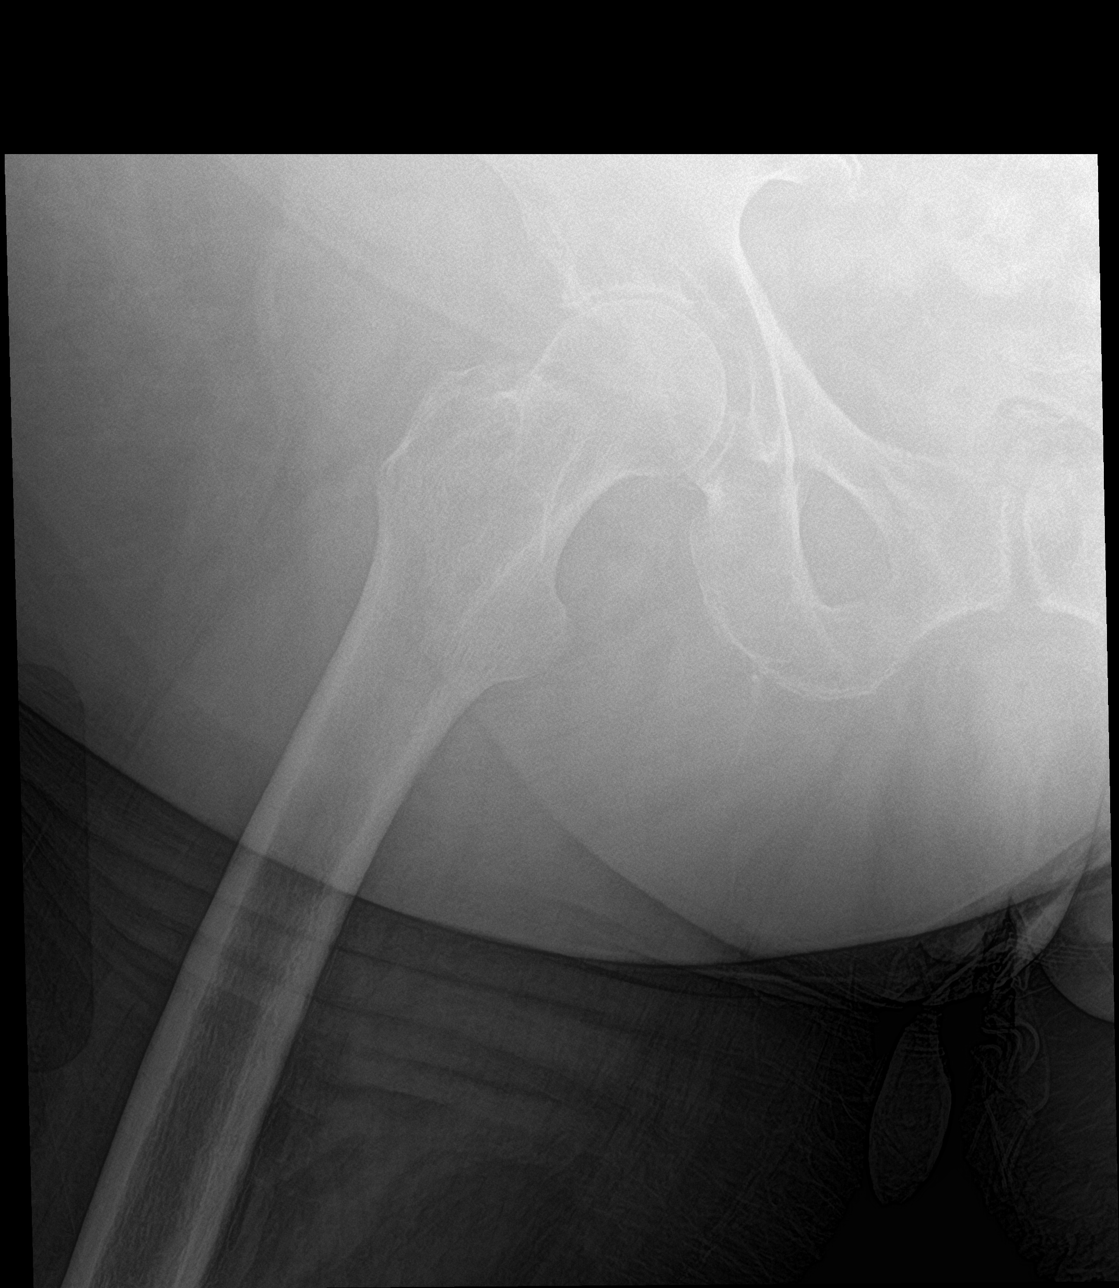

[pelvis ap]
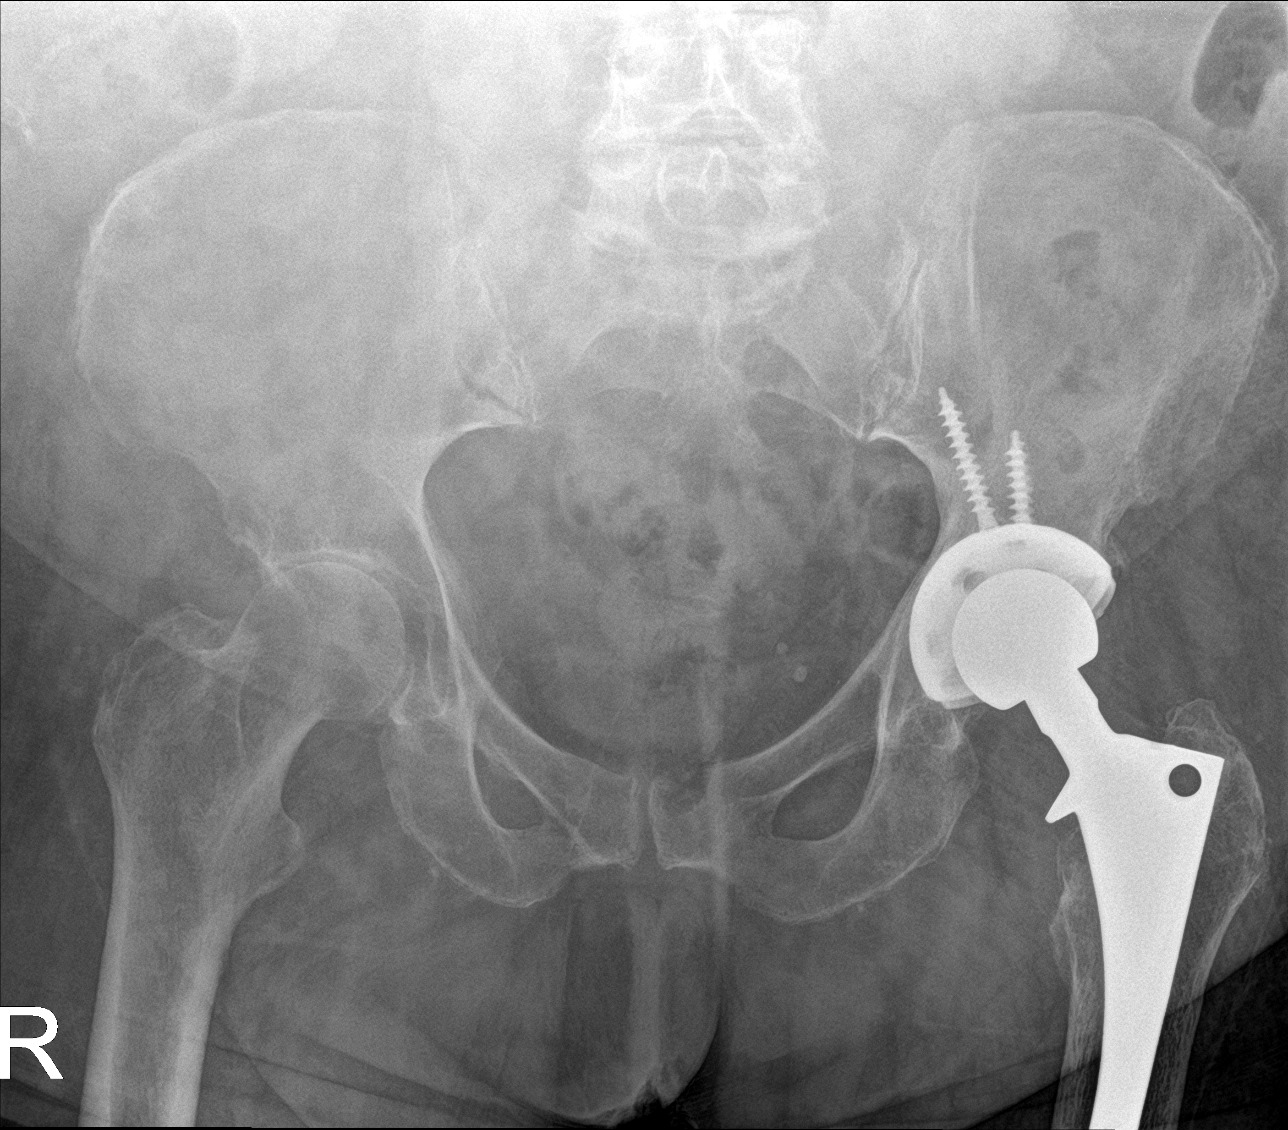

[3 of 3 positions shown; findings below may reference images not displayed]

FINDINGS: Evaluation is limited due to osteopenia and patient's body habitus.

There is a total left hip arthroplasty which appears intact and in
anatomic alignment. There is no acute fracture or dislocation. The
bones are osteopenic. The soft tissues are grossly unremarkable.
IMPRESSION: No acute fracture or dislocation.

## 2018-07-29 MED ORDER — LORAZEPAM 2 MG/ML IJ SOLN
1.0000 mg | Freq: Once | INTRAMUSCULAR | Status: AC
  Administered 2018-07-29: 1 mg via INTRAVENOUS
  Filled 2018-07-29: qty 1

## 2018-07-29 MED ORDER — FENTANYL CITRATE (PF) 100 MCG/2ML IJ SOLN
50.0000 ug | Freq: Once | INTRAMUSCULAR | Status: AC
  Administered 2018-07-29: 50 ug via INTRAVENOUS
  Filled 2018-07-29: qty 2

## 2018-07-29 MED ORDER — HYDROCODONE-ACETAMINOPHEN 5-325 MG PO TABS: 0.5000 | ORAL_TABLET | ORAL | 0 refills | Status: DC | PRN

## 2018-07-29 NOTE — ED Provider Notes (Signed)
72 yo F with a chief complaint of groin pain.  I received the patient in signout from Dr. Betsey Holiday. Briefly the patient had a CT scan that showed she had a hip adductor strain.  She was having difficulty getting around and so he wanted her evaluated by case management.  The patient had been in the emergency department for about 5 or 6 hours without case management being available to see her.  The patient at this point is requesting discharge home.  She already has home health orders ordered. D/c home.    Deno Etienne, DO 07/29/18 1136

## 2018-07-29 NOTE — ED Notes (Signed)
Pt discharged from ED; instructions provided and scripts given; Pt encouraged to return to ED if symptoms worsen and to f/u with PCP; Pt verbalized understanding of all instructions 

## 2018-07-29 NOTE — Care Management (Addendum)
14:40 07/29/2018 Discovered order for CM consult after patient discharged, was never notified of order while patient in hospital. Called patient and discussed needs. She states she has a RW and lives at home alone and has a sister who checks in on her. Discussed [place] services. She states that she would like to use [facility] and referral accepted by [facility], clinical liaison. Requested start of care Sunday or Monday.

## 2018-07-29 NOTE — Progress Notes (Signed)
Spoke with [name] [name], who in turn was speaking with Dr.  Due to patients size the only way it is not tight on her arms is if they are above her head.  Patient received meds for claustrophobia before she came to MRI.  Patient is saying she can not do the exam.  She is refusing to do exam okay to cancel per ED Physician.

## 2018-07-29 NOTE — ED Notes (Signed)
Pt to MRI via stretcher.

## 2018-07-29 NOTE — Discharge Instructions (Signed)
Call your orthopedic doctor to schedule a follow-up appointment.  Call Dr. Melford Aase for an appointment today as well.

## 2018-07-29 NOTE — ED Provider Notes (Signed)
[place] EMERGENCY DEPARTMENT Provider Note   CSN: [id number] Arrival date & time: 07/28/18  1952     History   Chief Complaint Chief Complaint  Patient presents with  . Groin Pain    HPI [name] [name] is a 72 y.o. female.  Patient presents to the emergency department for evaluation of pain in the right hip and groin area.  She reports that pain began approximately 2 months ago.  She denies any injury.  Earlier today she tried to get out of a car, however, and had severe pain when she moved her leg to step out of the car.  Since then she has had persistent pain that significantly worsens with any movement of the leg.     Past Medical History:  Diagnosis Date  . Anemia   . Arthritis   . Cancer [facility]) 2012   colon  . Diabetes mellitus without complication ([place])   . GERD (gastroesophageal reflux disease)   . Headache   . Hypertension   . Sleep apnea    has cpap, does not use cpap on regular basis    Patient Active Problem List   Diagnosis Date Noted  . HYPERLIPIDEMIA-MIXED 12/18/2010  . HYPERTENSION, BENIGN 12/18/2010  . SHORTNESS OF BREATH 12/18/2010  . OBSTRUCTIVE SLEEP APNEA 01/11/2008    Past Surgical History:  Procedure Laterality Date  . ABDOMINAL HYSTERECTOMY     partial  . BREAST BIOPSY Bilateral pt unsure   benign  . COLON SURGERY  2012   no chemo or radiation done  . COLONOSCOPY WITH PROPOFOL N/A 01/30/2015   Procedure: COLONOSCOPY WITH PROPOFOL;  Surgeon: Juanita Craver, MD;  Location: [facility] ENDOSCOPY;  Service: Endoscopy;  Laterality: N/A;  . JOINT REPLACEMENT     left hip replacement x 2  . left wrist surgery for fracture     . right hand cortisone injection     cast put on     OB History   None      Home Medications    Prior to Admission medications   Medication Sig Start Date End Date Taking? Authorizing Provider  Calcium Carb-Cholecalciferol (CALCIUM 600 + D PO) Take 1 tablet by mouth daily.    [provider]  famotidine (PEPCID) 40 MG tablet Take 40 mg by mouth 2 (two) times daily. 11/16/14   [provider]  ferrous sulfate 325 (65 FE) MG tablet Take 325 mg by mouth 3 (three) times daily with meals.    [provider]  hydrochlorothiazide (HYDRODIURIL) 25 MG tablet Take 25 mg by mouth every morning.  11/13/14   [provider]  insulin NPH-regular Human (NOVOLIN 70/30) (70-30) 100 UNIT/ML injection Inject 24-47 Units into the skin 2 (two) times daily with a meal.    [provider]  losartan (COZAAR) 100 MG tablet Take 100 mg by mouth at bedtime.  11/13/14   [provider]  metFORMIN (GLUCOPHAGE) 1000 MG tablet Take 1,000 mg by mouth 2 (two) times daily with a meal. 11/13/14   [provider]  simvastatin (ZOCOR) 80 MG tablet Take 80 mg by mouth at bedtime. 11/13/14   [provider]    Family History History reviewed. No pertinent family history.  Social History Social History   Tobacco Use  . Smoking status: Former Smoker    Packs/day: 0.25    Years: 10.00    Pack years: 2.50    Types: Cigarettes    Last attempt to quit: 11/29/1964  Years since quitting: 53.6  . Smokeless tobacco: Never Used  Substance Use Topics  . Alcohol use: No  . Drug use: No     Allergies   Codeine and Oxycodone-acetaminophen   Review of Systems Review of Systems  Musculoskeletal: Positive for arthralgias.  All other systems reviewed and are negative.    Physical Exam Updated Vital Signs BP (!) 143/89   Pulse 88   Temp 97.9 F (36.6 C) (Oral)   Resp 16   SpO2 97%   Physical Exam  Constitutional: She is oriented to person, place, and time. She appears well-developed and well-nourished. No distress.  HENT:  Head: Normocephalic and atraumatic.  Right Ear: Hearing normal.  Left Ear: Hearing normal.  Nose: Nose normal.  Mouth/Throat: Oropharynx is clear and moist and mucous membranes are normal.  Eyes: Pupils are equal,  round, and reactive to light. Conjunctivae and EOM are normal.  Neck: Normal range of motion. Neck supple.  Cardiovascular: Regular rhythm, S1 normal and S2 normal. Exam reveals no gallop and no friction rub.  No murmur heard. Pulmonary/Chest: Effort normal and breath sounds normal. No respiratory distress. She exhibits no tenderness.  Abdominal: Soft. Normal appearance and bowel sounds are normal. There is no hepatosplenomegaly. There is no tenderness. There is no rebound, no guarding, no tenderness at McBurney's point and negative Murphy's sign. No hernia.  Musculoskeletal:       Right hip: She exhibits decreased range of motion (Pain with abduction of hip).  Neurological: She is alert and oriented to person, place, and time. She has normal strength. No cranial nerve deficit or sensory deficit. Coordination normal. GCS eye subscore is 4. GCS verbal subscore is 5. GCS motor subscore is 6.  Skin: Skin is warm, dry and intact. No rash noted. No cyanosis.  Psychiatric: She has a normal mood and affect. Her speech is normal and behavior is normal. Thought content normal.  Nursing note and vitals reviewed.    ED Treatments / Results  Labs (all labs ordered are listed, but only abnormal results are displayed) Labs Reviewed  CBC - Abnormal; Notable for the following components:      Result Value   RBC 3.53 (*)    Hemoglobin 11.6 (*)    MCV 102.0 (*)    All other components within normal limits  BASIC METABOLIC PANEL - Abnormal; Notable for the following components:   Glucose, Bld 135 (*)    All other components within normal limits  URINALYSIS, ROUTINE W REFLEX MICROSCOPIC - Abnormal; Notable for the following components:   Hgb urine dipstick SMALL (*)    Ketones, ur 20 (*)    Bacteria, UA RARE (*)    All other components within normal limits    EKG None  Radiology Ct Hip Right Wo Contrast  Result Date: 07/29/2018 CLINICAL DATA:  72 year old female with right hip pain. EXAM: CT OF  THE RIGHT HIP WITHOUT CONTRAST TECHNIQUE: Multidetector CT imaging of the right hip was performed according to the standard protocol. Multiplanar CT image reconstructions were also generated. COMPARISON:  Right knee radiograph dated 07/29/2018 FINDINGS: Evaluation of the bones is limited due to osteopenia as well as artifact caused by patient's body habitus. Bones/Joint/Cartilage No definite or displaced fracture identified. No significant arthritic changes. No effusion. Ligaments Suboptimally assessed by CT. Muscles and Tendons There is stranding of the musculature and fascia of the medial compartment of the proximal right thigh. There is a linear lucency in the musculature and the adductor group (series  3 images 94-115) which may represent a partial muscular tear. No large hematoma. Soft tissues No fluid collection. IMPRESSION: 1. No definite acute fracture or dislocation. 2. Possible partial tear of the adductor musculature of the right thigh. No hematoma. Electronically Signed   By: Anner Crete M.D.   On: 07/29/2018 06:39   Dg Hip Unilat W Or Wo Pelvis 2-3 Views Right  Result Date: 07/29/2018 CLINICAL DATA:  72 year old female with right hip pain. No known injury. EXAM: DG HIP (WITH OR WITHOUT PELVIS) 2-3V RIGHT COMPARISON:  None. FINDINGS: Evaluation is limited due to osteopenia and patient's body habitus. There is a total left hip arthroplasty which appears intact and in anatomic alignment. There is no acute fracture or dislocation. The bones are osteopenic. The soft tissues are grossly unremarkable. IMPRESSION: No acute fracture or dislocation. Electronically Signed   By: Anner Crete M.D.   On: 07/29/2018 01:38    Procedures Procedures (including critical care time)  Medications Ordered in ED Medications  fentaNYL (SUBLIMAZE) injection 50 mcg (50 mcg Intravenous Given 07/29/18 0153)  LORazepam (ATIVAN) injection 1 mg (1 mg Intravenous Given 07/29/18 0443)     Initial Impression /  Assessment and Plan / ED Course  I have reviewed the triage vital signs and the nursing notes.  Pertinent labs & imaging results that were available during my care of the patient were reviewed by me and considered in my medical decision making (see chart for details).     Presents to the emergency department for evaluation of right groin pain.  Patient reports that she has been having pain for 2 months but pain worsened today when she was trying to get out of a car.  X-ray does not show any acute abnormality, MRI was ordered.  Patient was unable to tolerate the MRI so she was brought back to the ER and then ended up getting a CT scan.  No fracture is noted but there is possible abductor muscle tear which would support the current presentation.  She will need rest, analgesia, follow-up with orthopedics.  Final Clinical Impressions(s) / ED Diagnoses   Final diagnoses:  Strain of right hip adductor muscle, initial encounter    ED Discharge Orders    None       Pollina, Gwenyth Allegra, MD 07/29/18 [phone]

## 2018-07-29 NOTE — ED Notes (Signed)
Pt is aware of need for urine

## 2019-01-24 ENCOUNTER — Other Ambulatory Visit: Payer: Self-pay | Admitting: Nurse Practitioner

## 2019-01-24 DIAGNOSIS — Z1231 Encounter for screening mammogram for malignant neoplasm of breast: Secondary | ICD-10-CM

## 2019-02-20 ENCOUNTER — Ambulatory Visit: Payer: Medicare Other

## 2019-03-20 ENCOUNTER — Ambulatory Visit: Payer: Medicare Other

## 2019-05-08 ENCOUNTER — Ambulatory Visit: Payer: Medicare Other

## 2019-08-07 ENCOUNTER — Other Ambulatory Visit: Payer: Self-pay

## 2019-08-07 ENCOUNTER — Ambulatory Visit
Admission: RE | Admit: 2019-08-07 | Discharge: 2019-08-07 | Disposition: A | Discharge status: Home / Self Care | Payer: Medicare Other | Source: Ambulatory Visit | Attending: Nurse Practitioner | Admitting: Nurse Practitioner

## 2019-08-07 DIAGNOSIS — Z1231 Encounter for screening mammogram for malignant neoplasm of breast: Secondary | ICD-10-CM

## 2019-08-07 IMAGING — MG MM DIGITAL SCREENING BILAT W/ TOMO W/ CAD
8 of 15 series · 8 of 40 positions shown · non-contrast
Comparison: Previous exam(s).

ACR Breast Density Category a: The breast tissue is almost entirely
fatty.

CLINICAL DATA: Screening.

EXAM:
DIGITAL SCREENING BILATERAL MAMMOGRAM WITH TOMO AND CAD

[R MLO synth-2D]
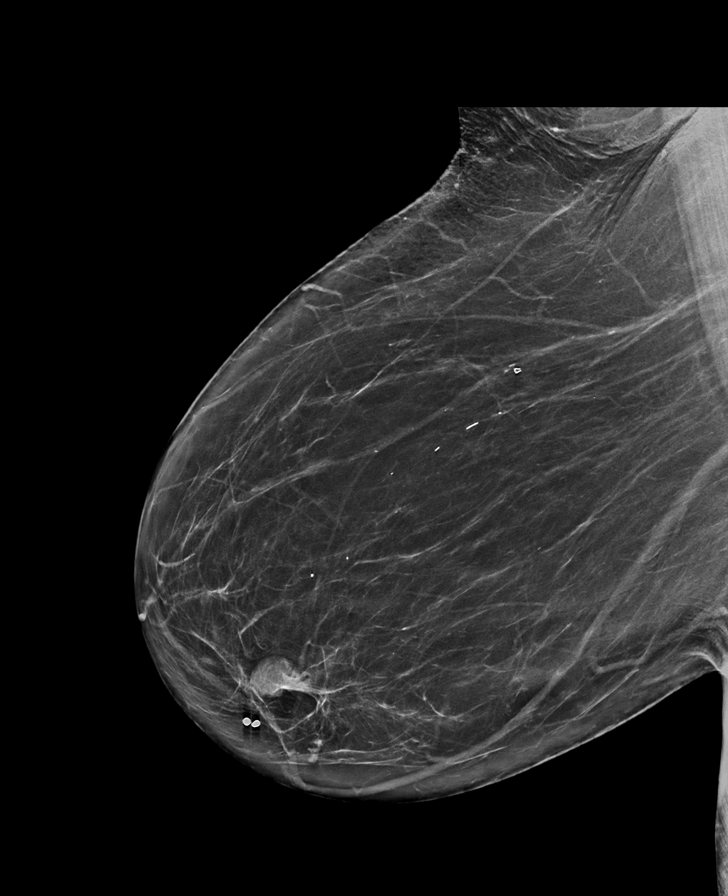

[L MLO synth-2D (1 of 2)]
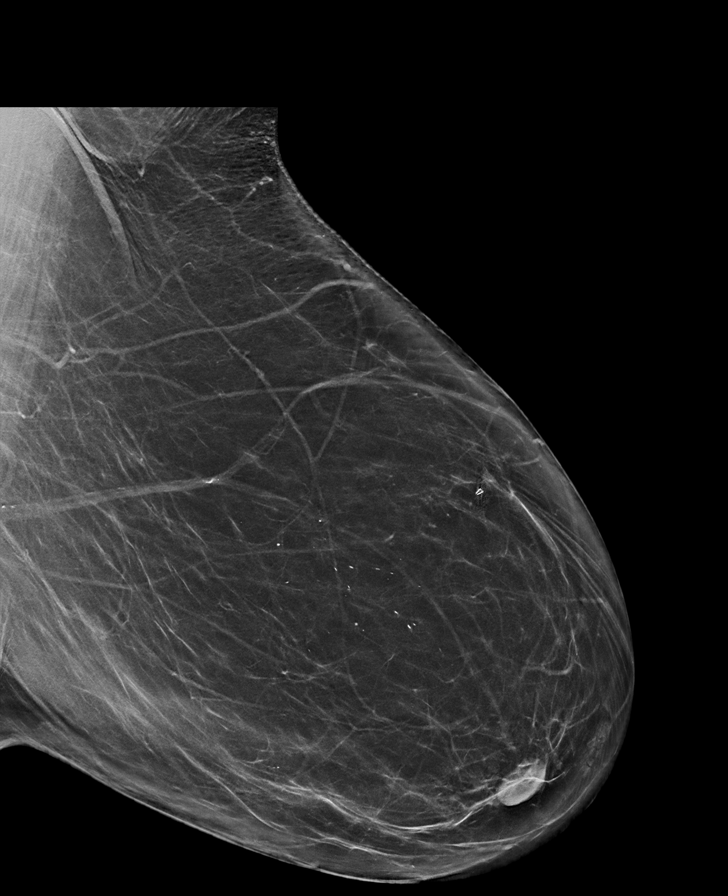

[L CC synth-2D (1 of 3)]
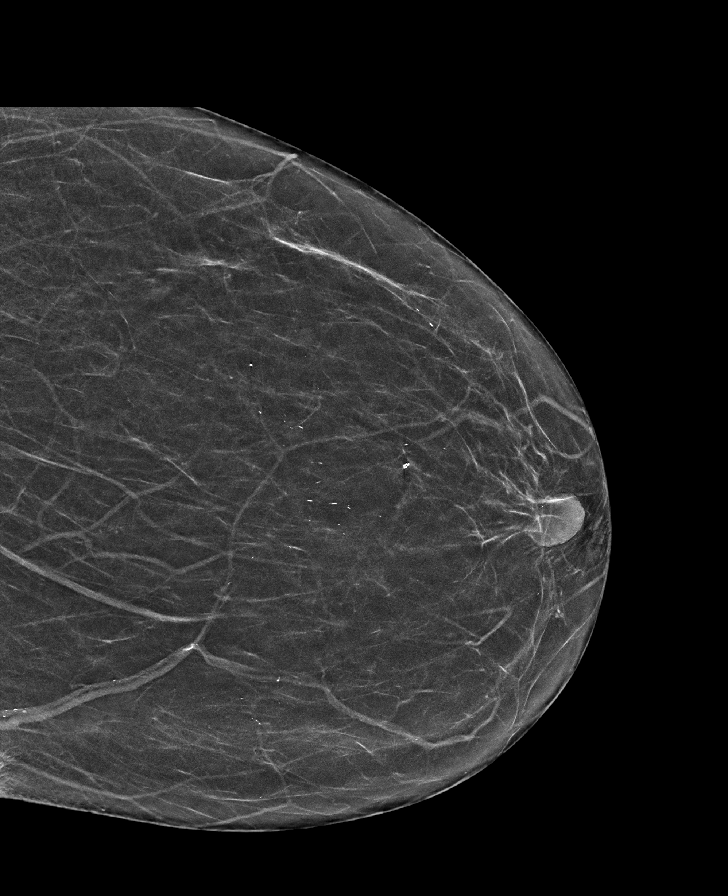

[L CC synth-2D (2 of 3)]
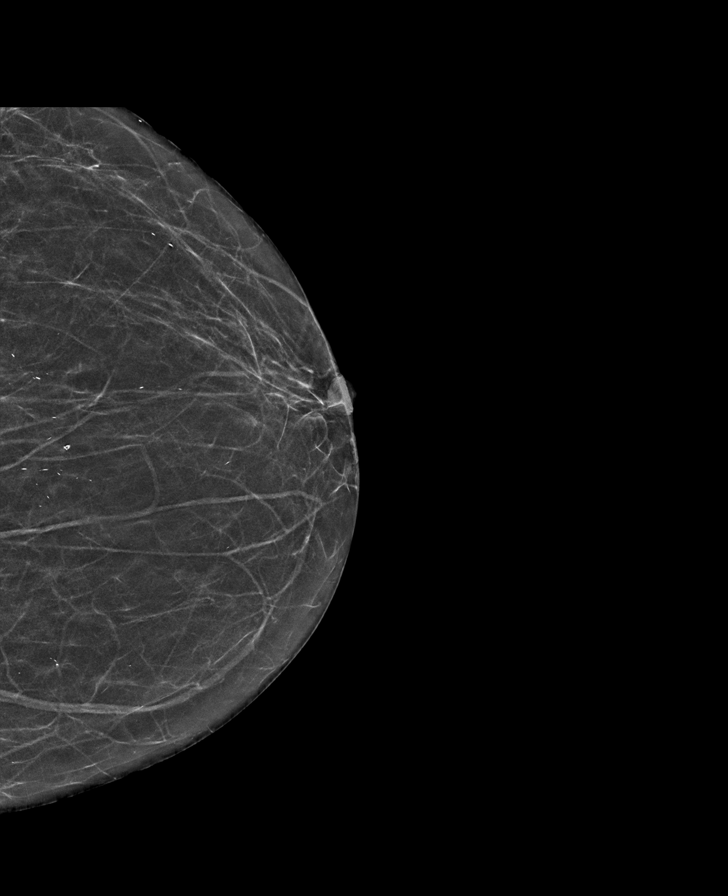

[L MLO synth-2D (2 of 2)]
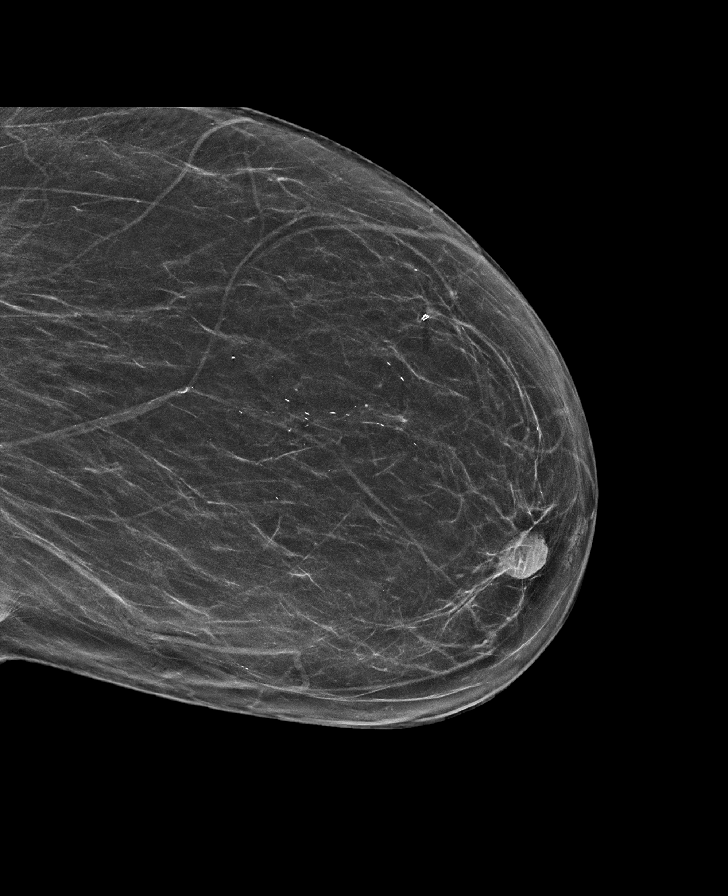

[R CC synth-2D]
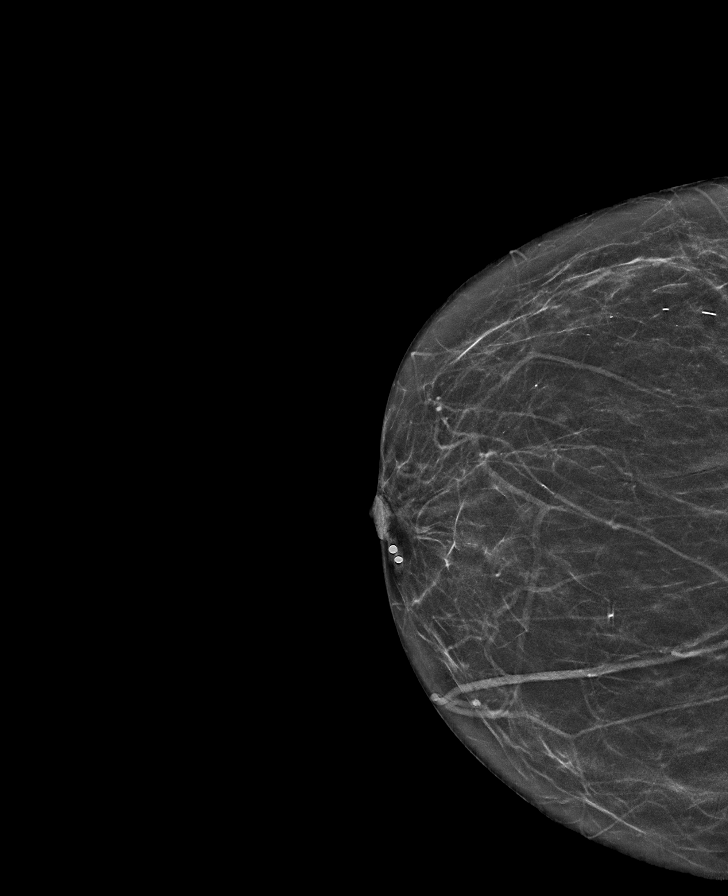

[L CC synth-2D (3 of 3)]
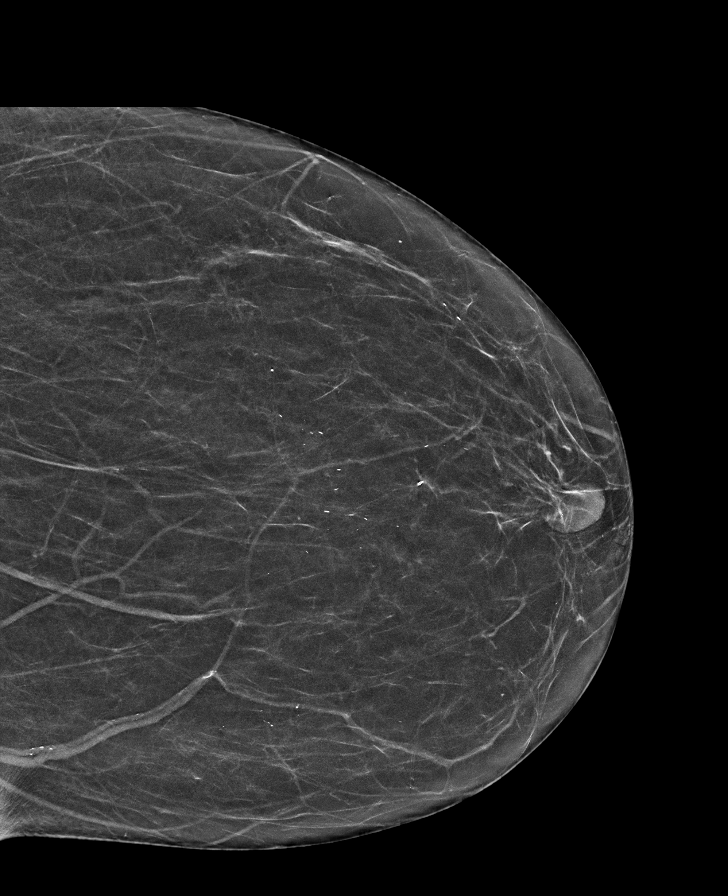

[R CC tomo · tomo slice 38/56.0]
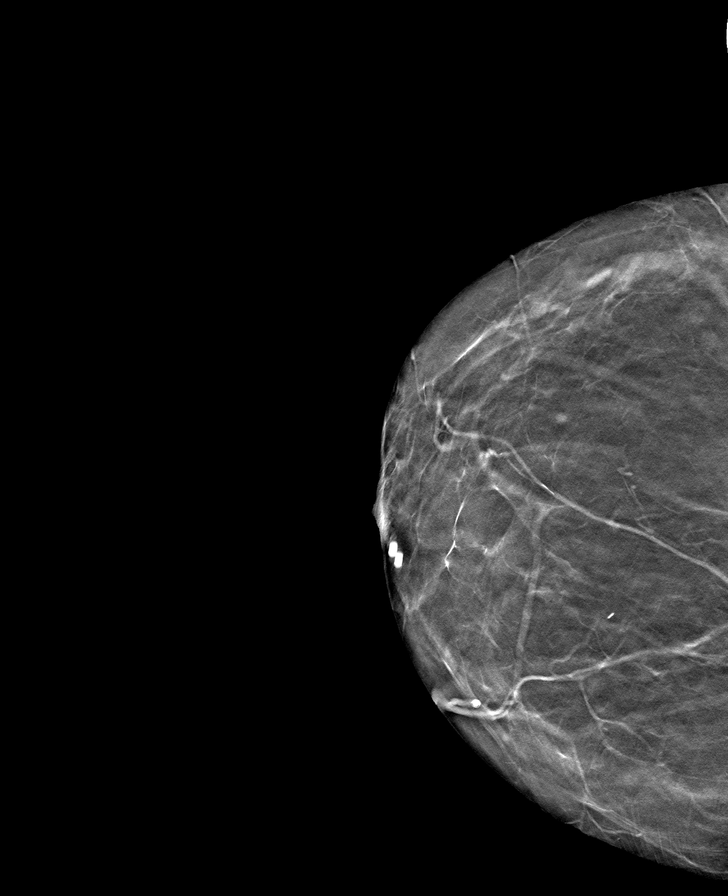

[8 of 40 positions shown; findings below may reference images not displayed]

FINDINGS: There are no findings suspicious for malignancy. Images were
processed with CAD.
IMPRESSION: No mammographic evidence of malignancy. A result letter of this
screening mammogram will be mailed directly to the patient.

RECOMMENDATION:
Screening mammogram in one year. (Code:[TA])

BI-RADS CATEGORY  1: Negative.

## 2020-10-30 ENCOUNTER — Emergency Department (HOSPITAL_COMMUNITY): Payer: Medicare HMO

## 2020-10-30 ENCOUNTER — Other Ambulatory Visit: Payer: Self-pay

## 2020-10-30 ENCOUNTER — Encounter (HOSPITAL_COMMUNITY): Payer: Self-pay

## 2020-10-30 ENCOUNTER — Emergency Department (HOSPITAL_COMMUNITY)
Admission: EM | Admit: 2020-10-30 | Discharge: 2020-10-30 | Disposition: A | Discharge status: Home / Self Care | Payer: Medicare HMO | Attending: Emergency Medicine | Admitting: Emergency Medicine

## 2020-10-30 DIAGNOSIS — Y92009 Unspecified place in unspecified non-institutional (private) residence as the place of occurrence of the external cause: Secondary | ICD-10-CM | POA: Insufficient documentation

## 2020-10-30 DIAGNOSIS — Z7984 Long term (current) use of oral hypoglycemic drugs: Secondary | ICD-10-CM | POA: Insufficient documentation

## 2020-10-30 DIAGNOSIS — E119 Type 2 diabetes mellitus without complications: Secondary | ICD-10-CM | POA: Diagnosis not present

## 2020-10-30 DIAGNOSIS — S99911A Unspecified injury of right ankle, initial encounter: Secondary | ICD-10-CM | POA: Diagnosis present

## 2020-10-30 DIAGNOSIS — S93401A Sprain of unspecified ligament of right ankle, initial encounter: Secondary | ICD-10-CM | POA: Diagnosis not present

## 2020-10-30 DIAGNOSIS — Z79899 Other long term (current) drug therapy: Secondary | ICD-10-CM | POA: Insufficient documentation

## 2020-10-30 DIAGNOSIS — Z794 Long term (current) use of insulin: Secondary | ICD-10-CM | POA: Insufficient documentation

## 2020-10-30 DIAGNOSIS — Z87891 Personal history of nicotine dependence: Secondary | ICD-10-CM | POA: Diagnosis not present

## 2020-10-30 DIAGNOSIS — I10 Essential (primary) hypertension: Secondary | ICD-10-CM | POA: Diagnosis not present

## 2020-10-30 DIAGNOSIS — X58XXXA Exposure to other specified factors, initial encounter: Secondary | ICD-10-CM | POA: Diagnosis not present

## 2020-10-30 DIAGNOSIS — Z85038 Personal history of other malignant neoplasm of large intestine: Secondary | ICD-10-CM | POA: Insufficient documentation

## 2020-10-30 DIAGNOSIS — Z7982 Long term (current) use of aspirin: Secondary | ICD-10-CM | POA: Diagnosis not present

## 2020-10-30 DIAGNOSIS — Z96642 Presence of left artificial hip joint: Secondary | ICD-10-CM | POA: Insufficient documentation

## 2020-10-30 LAB — BASIC METABOLIC PANEL
Anion gap: 11 (ref 5–15)
BUN: 16 mg/dL (ref 8–23)
CO2: 23 mmol/L (ref 22–32)
Calcium: 8.9 mg/dL (ref 8.9–10.3)
Chloride: 102 mmol/L (ref 98–111)
Creatinine, Ser: 0.72 mg/dL (ref 0.44–1.00)
GFR, Estimated: >60 mL/min (ref >60)
Glucose, Bld: 105 mg/dL — ABNORMAL HIGH (ref 70–99)
Potassium: 3.8 mmol/L (ref 3.5–5.1)
Sodium: 136 mmol/L (ref 135–145)

## 2020-10-30 LAB — CBC WITH DIFFERENTIAL/PLATELET
Abs Immature Granulocytes: 0.01 10*3/uL (ref 0.00–0.07)
Basophils Absolute: 0 10*3/uL (ref 0.0–0.1)
Basophils Relative: 1 %
Eosinophils Absolute: 0.1 10*3/uL (ref 0.0–0.5)
Eosinophils Relative: 2 %
HCT: 33.8 % — ABNORMAL LOW (ref 36.0–46.0)
Hemoglobin: 10.9 g/dL — ABNORMAL LOW (ref 12.0–15.0)
Immature Granulocytes: 0 %
Lymphocytes Relative: 19 %
Lymphs Abs: 1.5 10*3/uL (ref 0.7–4.0)
MCH: 30.6 pg (ref 26.0–34.0)
MCHC: 32.2 g/dL (ref 30.0–36.0)
MCV: 94.9 fL (ref 80.0–100.0)
Monocytes Absolute: 0.7 10*3/uL (ref 0.1–1.0)
Monocytes Relative: 8 %
Neutro Abs: 5.8 10*3/uL (ref 1.7–7.7)
Neutrophils Relative %: 70 %
Platelets: 188 10*3/uL (ref 150–400)
RBC: 3.56 MIL/uL — ABNORMAL LOW (ref 3.87–5.11)
RDW: 12.9 % (ref 11.5–15.5)
WBC: 8.1 10*3/uL (ref 4.0–10.5)
nRBC: 0 % (ref 0.0–0.2)

## 2020-10-30 IMAGING — CR DG ANKLE COMPLETE 3+V*R*
4 series · 4 of 4 positions shown · non-contrast
Comparison: None.

CLINICAL DATA: Onset right ankle pain and swelling today. No known
injury.

EXAM:
RIGHT ANKLE - COMPLETE 3+ VIEW

[x ankle lat right]
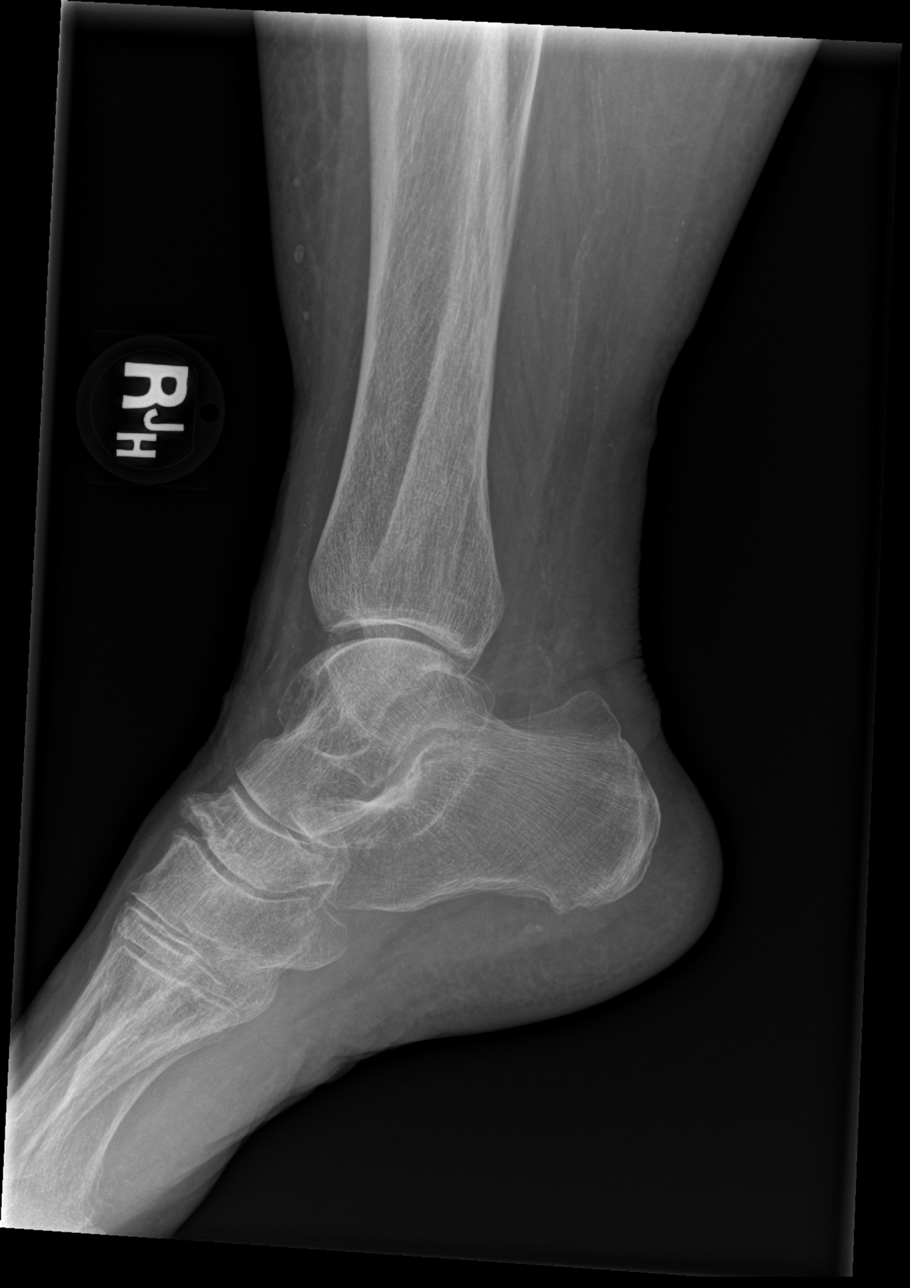

[x ankle obl right (1 of 3)]
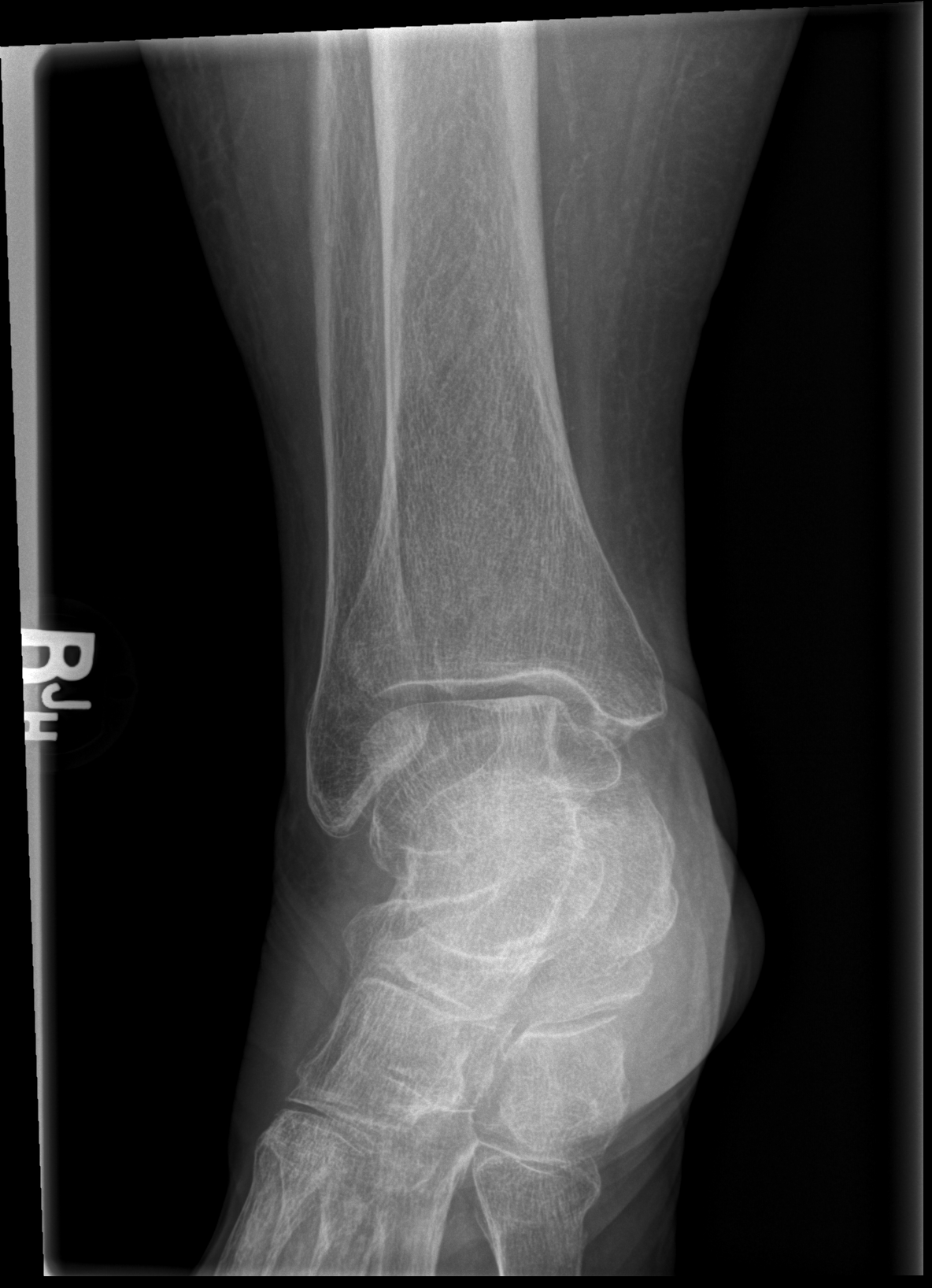

[x ankle obl right (2 of 3)]
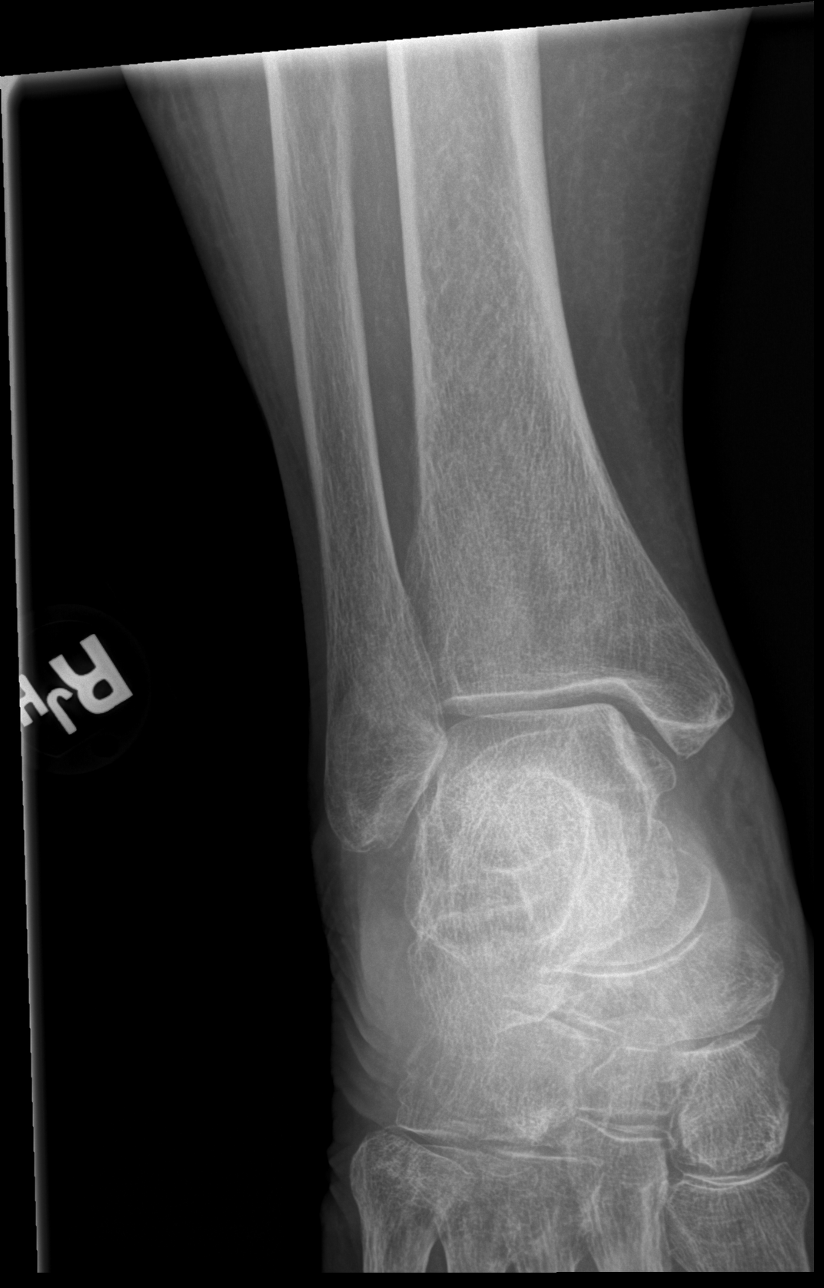

[x ankle obl right (3 of 3)]
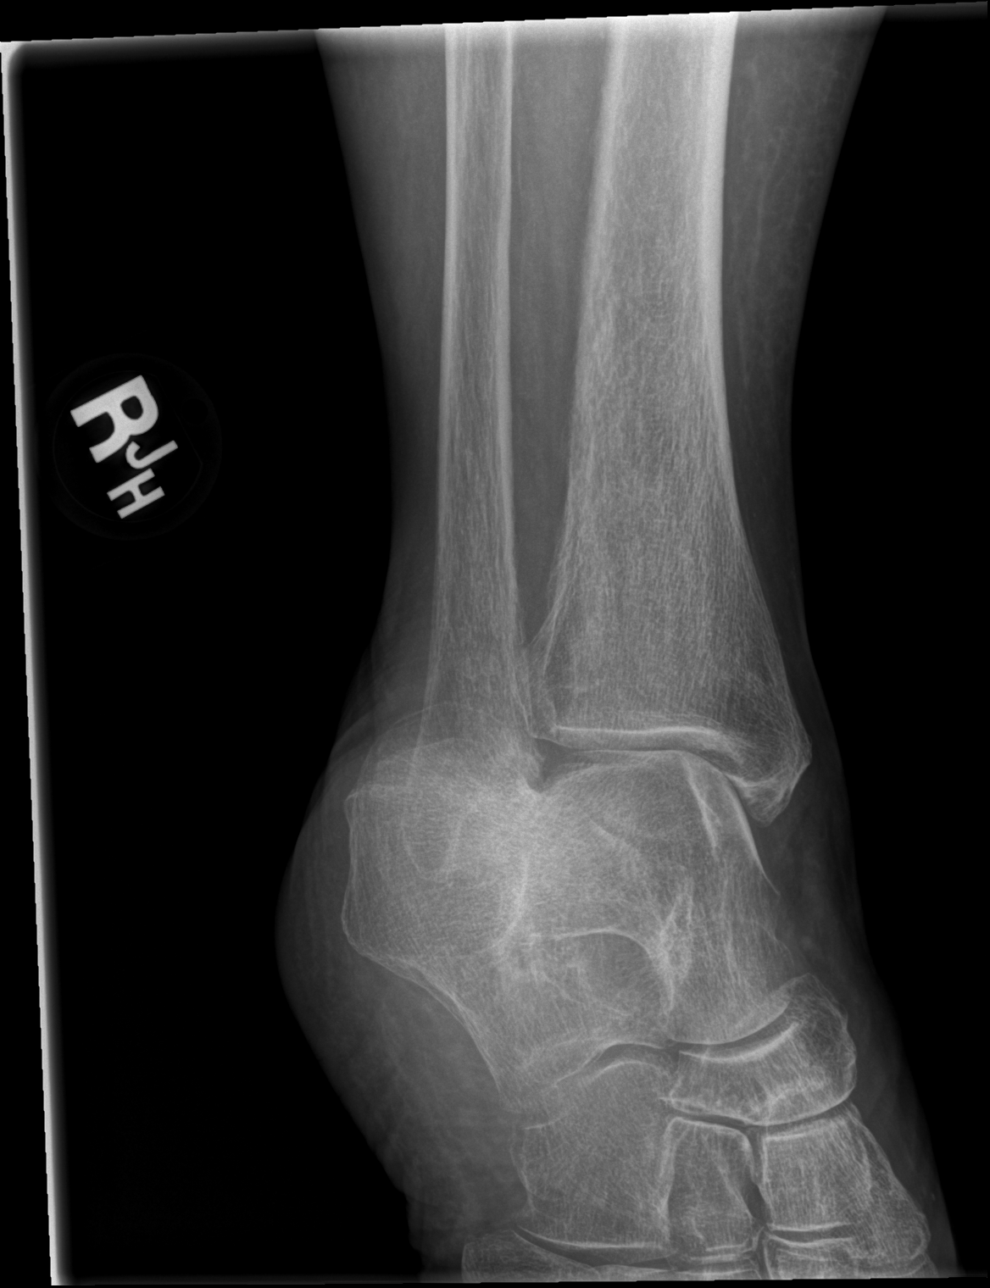

[4 of 4 positions shown; findings below may reference images not displayed]

FINDINGS: There is no evidence of fracture, dislocation, or joint effusion.
There is mild to moderate midfoot osteoarthritis. Soft tissues are
unremarkable.
IMPRESSION: No acute abnormality.

Midfoot osteoarthritis.

## 2020-10-30 MED ORDER — MORPHINE SULFATE (PF) 4 MG/ML IV SOLN
4.0000 mg | Freq: Once | INTRAVENOUS | Status: AC
  Administered 2020-10-30: 4 mg via INTRAVENOUS
  Filled 2020-10-30: qty 1

## 2020-10-30 MED ORDER — ENOXAPARIN SODIUM 120 MG/0.8ML ~~LOC~~ SOLN
1.0000 mg/kg | Freq: Once | SUBCUTANEOUS | Status: AC
  Administered 2020-10-30: 108 mg via SUBCUTANEOUS
  Filled 2020-10-30: qty 0.72

## 2020-10-30 MED ORDER — HYDROCODONE-ACETAMINOPHEN 5-325 MG PO TABS
1.0000 | ORAL_TABLET | Freq: Four times a day (QID) | ORAL | 0 refills | Status: DC | PRN
Start: 2020-10-30 — End: 2021-04-16

## 2020-10-30 NOTE — Discharge Instructions (Signed)
Use cam walker and your walker   You will be called tomorrow to return for Ultrasound to rule out blood clots   You can take vicodin for pain   See orthopedic doctor for follow up   Return to ER if you have worse ankle pain and swelling

## 2020-10-30 NOTE — ED Triage Notes (Signed)
Pt BIB EMS from home. Pt reports waking up with right ankle pain and swelling. Pt denies injury.

## 2020-10-30 NOTE — ED Provider Notes (Signed)
[facility] DEPT Provider Note   CSN: [id number] Arrival date & time: 10/30/20  6812     History Chief Complaint  Patient presents with  . Ankle Pain    STACHIA SLUTSKY is a 74 y.o. female hx of DM, GERD, HTN, here with right ankle pain. Patient states that she woke up today and she has pain in the right ankle. Also noticed right ankle swelling. Denies trauma or injury.   The history is provided by the patient.       Past Medical History:  Diagnosis Date  . Anemia   . Arthritis   . Cancer [facility]) 2012   colon  . Diabetes mellitus without complication ([place])   . GERD (gastroesophageal reflux disease)   . Headache   . Hypertension   . Sleep apnea    has cpap, does not use cpap on regular basis    Patient Active Problem List   Diagnosis Date Noted  . HYPERLIPIDEMIA-MIXED 12/18/2010  . HYPERTENSION, BENIGN 12/18/2010  . SHORTNESS OF BREATH 12/18/2010  . OBSTRUCTIVE SLEEP APNEA 01/11/2008    Past Surgical History:  Procedure Laterality Date  . ABDOMINAL HYSTERECTOMY     partial  . BREAST BIOPSY Bilateral pt unsure   benign  . COLON SURGERY  2012   no chemo or radiation done  . COLONOSCOPY WITH PROPOFOL N/A 01/30/2015   Procedure: COLONOSCOPY WITH PROPOFOL;  Surgeon: Juanita Craver, MD;  Location: [facility] ENDOSCOPY;  Service: Endoscopy;  Laterality: N/A;  . JOINT REPLACEMENT     left hip replacement x 2  . left wrist surgery for fracture     . right hand cortisone injection     cast put on     OB History   No obstetric history on file.     History reviewed. No pertinent family history.  Social History   Tobacco Use  . Smoking status: Former Smoker    Packs/day: 0.25    Years: 10.00    Pack years: 2.50    Types: Cigarettes    Quit date: 11/29/1964    Years since quitting: 55.9  . Smokeless tobacco: Never Used  Substance Use Topics  . Alcohol use: No  . Drug use: No    Home Medications Prior to Admission medications     Medication Sig Start Date End Date Taking? Authorizing Provider  ALPRAZolam (XANAX) 0.25 MG tablet Take 0.25 mg by mouth 2 (two) times daily as needed for anxiety.    [provider]  aspirin EC 81 MG tablet Take 81 mg by mouth daily.    [provider]  famotidine (PEPCID) 40 MG tablet Take 40 mg by mouth 2 (two) times daily. 11/16/14   [provider]  ferrous sulfate 325 (65 FE) MG tablet Take 325 mg by mouth 3 (three) times daily with meals.    [provider]  hydrochlorothiazide (HYDRODIURIL) 25 MG tablet Take 25 mg by mouth every morning.  11/13/14   [provider]  HYDROcodone-acetaminophen (NORCO/VICODIN) 5-325 MG tablet Take 0.5-1 tablets by mouth every 4 (four) hours as needed for moderate pain. 07/29/18   Orpah Greek, MD  insulin NPH-regular Human (NOVOLIN 70/30) (70-30) 100 UNIT/ML injection Inject 24-40 Units into the skin 2 (two) times daily with a meal. 40 units in the morning and 24 units in the evening    [provider]  losartan (COZAAR) 100 MG tablet Take 100 mg by mouth at bedtime.  11/13/14   [provider]  metFORMIN (GLUCOPHAGE) 1000 MG tablet Take 1,000 mg by mouth 2 (two) times daily with a meal. 11/13/14   [provider]  simvastatin (ZOCOR) 80 MG tablet Take 80 mg by mouth at bedtime. 11/13/14   [provider]    Allergies    Codeine and Oxycodone-acetaminophen  Review of Systems   Review of Systems  Musculoskeletal:       R ankle pain   All other systems reviewed and are negative.   Physical Exam Updated Vital Signs BP 125/65 (BP Location: Right Arm)   Pulse 76   Temp 99.1 F (37.3 C) (Oral)   Resp 16   Ht 5\' 2"  (1.575 m)   Wt 107.5 kg   SpO2 99%   BMI 43.35 kg/m   Physical Exam Vitals and nursing note reviewed.  Constitutional:      Appearance: Normal appearance.  HENT:     Head: Normocephalic.     Nose: Nose normal.     Mouth/Throat:     Mouth:  Mucous membranes are moist.  Eyes:     Extraocular Movements: Extraocular movements intact.     Pupils: Pupils are equal, round, and reactive to light.  Cardiovascular:     Rate and Rhythm: Normal rate.     Pulses: Normal pulses.  Pulmonary:     Effort: Pulmonary effort is normal.  Abdominal:     General: Abdomen is flat.  Musculoskeletal:     Cervical back: Normal range of motion and neck supple.     Comments: R ankle swollen, mild swelling R lower leg, neurovascular intact R lower leg   Skin:    General: Skin is warm.     Capillary Refill: Capillary refill takes less than 2 seconds.  Neurological:     General: No focal deficit present.     Mental Status: She is alert and oriented to person, place, and time.  Psychiatric:        Mood and Affect: Mood normal.        Behavior: Behavior normal.     ED Results / Procedures / Treatments   Labs (all labs ordered are listed, but only abnormal results are displayed) Labs Reviewed  CBC WITH DIFFERENTIAL/PLATELET - Abnormal; Notable for the following components:      Result Value   RBC 3.56 (*)    Hemoglobin 10.9 (*)    HCT 33.8 (*)    All other components within normal limits  BASIC METABOLIC PANEL - Abnormal; Notable for the following components:   Glucose, Bld 105 (*)    All other components within normal limits    EKG None  Radiology DG Ankle Complete Right  Result Date: 10/30/2020 CLINICAL DATA:  Onset right ankle pain and swelling today. No known injury. EXAM: RIGHT ANKLE - COMPLETE 3+ VIEW COMPARISON:  None. FINDINGS: There is no evidence of fracture, dislocation, or joint effusion. 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 Soft tissues are unremarkable. IMPRESSION: No acute abnormality. Midfoot osteoarthritis. Electronically Signed   By: Inge Rise M.D.   On: 10/30/2020 20:25    Procedures Procedures (including critical care time)  Medications Ordered in ED Medications  morphine 4 MG/ML injection 4  mg (4 mg Intravenous Given 10/30/20 2119)  enoxaparin (LOVENOX) injection 108 mg (108 mg Subcutaneous Given 10/30/20 2205)    ED Course  I have reviewed the triage vital signs and the nursing notes.  Pertinent labs & imaging results that were available during my care of the  patient were reviewed by me and considered in my medical decision making (see chart for details).    MDM Rules/Calculators/A&P                           REMA LIEVANOS is a 73 y.o. female here with R ankle pain and swelling. Consider ankle sprain vs DVT. Unable to get DVT study today so will give lovenox and check blood work and get xrays   10:15 PM Xray showed no fracture. Labs unremarkable. Pain improved. Will dc home with cam walker. She has a walker at home. Will give pain meds and ortho follow up. Will bring back in AM for DVT study   Final Clinical Impression(s) / ED Diagnoses Final diagnoses:  None    Rx / DC Orders ED Discharge Orders    None       Drenda Freeze, MD 10/30/20 2216

## 2020-10-30 NOTE — Progress Notes (Signed)
Orthopedic Tech Progress Note Patient Details:  [name] [name] 05/06/1946 [id number]  Ortho Devices Ortho Device/Splint Location: applied cam walker to RLE Ortho Device/Splint Interventions: Ordered, Application   Post Interventions Patient Tolerated: Well Instructions Provided: Care of device   Braulio Bosch 10/30/2020, 9:35 PM

## 2020-10-30 NOTE — ED Notes (Signed)
Pt needing restroom assistance upon arrival to hall A. Pt unable to bear weight on ankle due to pain. Pt unable to stand unassisted to get into bed. Pt crawled up onto bed.

## 2020-10-31 ENCOUNTER — Ambulatory Visit (HOSPITAL_COMMUNITY): Admission: RE | Admit: 2020-10-31 | Payer: Medicare HMO | Source: Ambulatory Visit

## 2020-11-01 ENCOUNTER — Emergency Department (HOSPITAL_COMMUNITY)
Admission: EM | Admit: 2020-11-01 | Discharge: 2020-11-01 | Disposition: A | Discharge status: Home / Self Care | Payer: Medicare HMO | Attending: Emergency Medicine | Admitting: Emergency Medicine

## 2020-11-01 ENCOUNTER — Other Ambulatory Visit: Payer: Self-pay

## 2020-11-01 ENCOUNTER — Encounter (HOSPITAL_COMMUNITY): Payer: Self-pay

## 2020-11-01 ENCOUNTER — Emergency Department (HOSPITAL_BASED_OUTPATIENT_CLINIC_OR_DEPARTMENT_OTHER): Payer: Medicare HMO

## 2020-11-01 DIAGNOSIS — M7989 Other specified soft tissue disorders: Secondary | ICD-10-CM | POA: Insufficient documentation

## 2020-11-01 DIAGNOSIS — M79604 Pain in right leg: Secondary | ICD-10-CM | POA: Insufficient documentation

## 2020-11-01 DIAGNOSIS — E119 Type 2 diabetes mellitus without complications: Secondary | ICD-10-CM | POA: Insufficient documentation

## 2020-11-01 DIAGNOSIS — Z96642 Presence of left artificial hip joint: Secondary | ICD-10-CM | POA: Insufficient documentation

## 2020-11-01 DIAGNOSIS — J45909 Unspecified asthma, uncomplicated: Secondary | ICD-10-CM | POA: Diagnosis not present

## 2020-11-01 DIAGNOSIS — Z87891 Personal history of nicotine dependence: Secondary | ICD-10-CM | POA: Diagnosis not present

## 2020-11-01 DIAGNOSIS — M79661 Pain in right lower leg: Secondary | ICD-10-CM

## 2020-11-01 DIAGNOSIS — Z7982 Long term (current) use of aspirin: Secondary | ICD-10-CM | POA: Diagnosis not present

## 2020-11-01 DIAGNOSIS — I10 Essential (primary) hypertension: Secondary | ICD-10-CM | POA: Insufficient documentation

## 2020-11-01 DIAGNOSIS — Z7984 Long term (current) use of oral hypoglycemic drugs: Secondary | ICD-10-CM | POA: Diagnosis not present

## 2020-11-01 DIAGNOSIS — Z794 Long term (current) use of insulin: Secondary | ICD-10-CM | POA: Diagnosis not present

## 2020-11-01 DIAGNOSIS — R011 Cardiac murmur, unspecified: Secondary | ICD-10-CM | POA: Diagnosis not present

## 2020-11-01 DIAGNOSIS — M79609 Pain in unspecified limb: Secondary | ICD-10-CM

## 2020-11-01 NOTE — ED Notes (Signed)
Vascular in room with pt

## 2020-11-01 NOTE — ED Provider Notes (Signed)
[place] DEPT Provider Note   CSN: [id number] Arrival date & time: 11/01/20  1056     History Chief Complaint  Patient presents with  . Ankle Injury    [name] [name] is a 74 y.o. female history of diabetes, hypertension, GERD.  Patient presents with a chief complaint of right lower leg pain and swelling.  Patient states that her symptoms began on Tuesday 10/28/20.  Patient was seen at [facility] on 12/2 for the same complaints.  X-ray of right ankle showed no fractures, she was given Lovenox, discharged with a cam walker, told to follow-up with Ortho scheduled for DVT study.  Patient reports that she did not go for her DVT study.    Patient reports pain is constant, describes it as an aching, states the pain is worse when putting weight on her right leg, patient reports the pain is better Norco and elevates her leg.  Patient reports that yesterday afternoon she felt a "pop," and bilateral right ankle.  Patient denies any recent falls, or traumatic injuries.  Patient reports that she has been able to stand to transfer at home.  She reports she has been using a walker at home.    Patient denies any active cancer, previous DVT or PE, or paralysis.  Patient reports that she has been laying in bed due to chronic knee pain and more recently this right lower leg pain.   HPI     Past Medical History:  Diagnosis Date  . Anemia   . Arthritis   . Cancer [facility] 2012   colon  . Diabetes mellitus without complication ([place])   . GERD (gastroesophageal reflux disease)   . Headache   . Hypertension   . Sleep apnea    has cpap, does not use cpap on regular basis    Patient Active Problem List   Diagnosis Date Noted  . HYPERLIPIDEMIA-MIXED 12/18/2010  . HYPERTENSION, BENIGN 12/18/2010  . SHORTNESS OF BREATH 12/18/2010  . OBSTRUCTIVE SLEEP APNEA 01/11/2008    Past Surgical History:  Procedure Laterality Date  . ABDOMINAL HYSTERECTOMY      partial  . BREAST BIOPSY Bilateral pt unsure   benign  . COLON SURGERY  2012   no chemo or radiation done  . COLONOSCOPY WITH PROPOFOL N/A 01/30/2015   Procedure: COLONOSCOPY WITH PROPOFOL;  Surgeon: Juanita Craver, MD;  Location: [facility] ENDOSCOPY;  Service: Endoscopy;  Laterality: N/A;  . JOINT REPLACEMENT     left hip replacement x 2  . left wrist surgery for fracture     . right hand cortisone injection     cast put on     OB History   No obstetric history on file.     History reviewed. No pertinent family history.  Social History   Tobacco Use  . Smoking status: Former Smoker    Packs/day: 0.25    Years: 10.00    Pack years: 2.50    Types: Cigarettes    Quit date: 11/29/1964    Years since quitting: 55.9  . Smokeless tobacco: Never Used  Substance Use Topics  . Alcohol use: No  . Drug use: No    Home Medications Prior to Admission medications   Medication Sig Start Date End Date Taking? Authorizing Provider  ALPRAZolam (XANAX) 0.25 MG tablet Take 0.25 mg by mouth 2 (two) times daily as needed for anxiety.    [provider]  aspirin EC 81 MG tablet Take 81 mg by mouth  daily.    [provider]  famotidine (PEPCID) 40 MG tablet Take 40 mg by mouth 2 (two) times daily. 11/16/14   [provider]  ferrous sulfate 325 (65 FE) MG tablet Take 325 mg by mouth 3 (three) times daily with meals.    [provider]  hydrochlorothiazide (HYDRODIURIL) 25 MG tablet Take 25 mg by mouth every morning.  11/13/14   [provider]  HYDROcodone-acetaminophen (NORCO/VICODIN) 5-325 MG tablet Take 1 tablet by mouth every 6 (six) hours as needed. 10/30/20   Drenda Freeze, MD  insulin NPH-regular Human (NOVOLIN 70/30) (70-30) 100 UNIT/ML injection Inject 24-40 Units into the skin 2 (two) times daily with a meal. 40 units in the morning and 24 units in the evening    [provider]  losartan (COZAAR) 100 MG tablet Take 100 mg by mouth at  bedtime.  11/13/14   [provider]  metFORMIN (GLUCOPHAGE) 1000 MG tablet Take 1,000 mg by mouth 2 (two) times daily with a meal. 11/13/14   [provider]  simvastatin (ZOCOR) 80 MG tablet Take 80 mg by mouth at bedtime. 11/13/14   [provider]    Allergies    Codeine and Oxycodone-acetaminophen  Review of Systems   Review of Systems  Constitutional: Negative for chills and fever.  Eyes: Negative for visual disturbance.  Respiratory: Negative for cough and shortness of breath.        Negative for hemoptysis  Cardiovascular: Positive for leg swelling. Negative for chest pain.  Gastrointestinal: Negative for abdominal distention, abdominal pain, nausea and vomiting.  Genitourinary: Negative for difficulty urinating and dysuria.  Musculoskeletal: Negative for back pain and neck pain.  Skin: Negative for color change and rash.  Neurological: Negative for dizziness, syncope and light-headedness.  Psychiatric/Behavioral: Negative for confusion.    Physical Exam Updated Vital Signs BP (!) 164/79 (BP Location: Left Arm)   Pulse (!) 104   Temp 99 F (37.2 C) (Oral)   Resp 17   Ht 5\' 2"  (1.575 m)   SpO2 97%   BMI 43.35 kg/m   Physical Exam Constitutional:      General: She is not in acute distress.    Appearance: She is obese. She is not ill-appearing, toxic-appearing or diaphoretic.  HENT:     Head: Normocephalic.  Cardiovascular:     Rate and Rhythm: Normal rate.     Heart sounds: Murmur (per pt this is her baseline) heard.   Pulmonary:     Effort: Pulmonary effort is normal.     Breath sounds: Normal breath sounds.  Abdominal:     Palpations: Abdomen is soft.     Tenderness: There is no abdominal tenderness.  Musculoskeletal:     Right lower leg: Swelling and tenderness present. No deformity or lacerations.     Left lower leg: No swelling, deformity, lacerations or tenderness.     Right ankle: Swelling present. No deformity, ecchymosis  or lacerations. No tenderness. Normal range of motion. Normal pulse.     Right Achilles Tendon: No tenderness or defects. Thompson's test negative.     Left ankle: No swelling, deformity, ecchymosis or lacerations. No tenderness. Normal range of motion. Normal pulse.     Right foot: Swelling present. No deformity. Normal pulse.     Left foot: No swelling or deformity. Normal pulse.     Comments: Swelling to far lateral right ankle and right calf,   Skin:    General: Skin is warm and dry.  Neurological:     General: No focal deficit present.     Mental Status: She is alert.  Psychiatric:        Behavior: Behavior is cooperative.     ED Results / Procedures / Treatments   Labs (all labs ordered are listed, but only abnormal results are displayed) Labs Reviewed - No data to display  EKG None  Radiology VAS [place] LOWER EXTREMITY VENOUS (DVT) (ONLY [facility] & [facility] 7a-7p)  Result Date: 11/01/2020  Lower Venous DVT Study Indications: Pain, and unable to bear weight on right leg x4 days.  Limitations: Body habitus, poor ultrasound/tissue interface and patient position. Comparison Study: No prior study Performing Technologist: Maudry Mayhew MHA, RDMS, RVT, RDCS  Examination Guidelines: A complete evaluation includes B-mode imaging, spectral Doppler, color Doppler, and power Doppler as needed of all accessible portions of each vessel. Bilateral testing is considered an integral part of a complete examination. Limited examinations for reoccurring indications may be performed as noted. The reflux portion of the exam is performed with the patient in reverse Trendelenburg.  +---------+---------------+---------+-----------+----------+--------------+ RIGHT    CompressibilityPhasicitySpontaneityPropertiesThrombus Aging +---------+---------------+---------+-----------+----------+--------------+ CFV      Full           Yes      Yes        patent                    +---------+---------------+---------+-----------+----------+--------------+ SFJ      Full                               patent                   +---------+---------------+---------+-----------+----------+--------------+ FV Prox  Full                               patent                   +---------+---------------+---------+-----------+----------+--------------+ FV Mid   Full                               patent                   +---------+---------------+---------+-----------+----------+--------------+ FV DistalFull                               patent                   +---------+---------------+---------+-----------+----------+--------------+ PFV      Full                               patent                   +---------+---------------+---------+-----------+----------+--------------+ POP      Full           Yes      Yes        patent                   +---------+---------------+---------+-----------+----------+--------------+ PTV  Yes        patent                   +---------+---------------+---------+-----------+----------+--------------+   Right Technical Findings: Not visualized segments include peroneal veins.  Left Technical Findings: Not visualized segments include CFV.   Summary: RIGHT: - There is no evidence of deep vein thrombosis in the lower extremity. However, portions of this examination were limited- see technologist comments above.  - No cystic structure found in the popliteal fossa.  LEFT: - No evidence of common femoral vein obstruction.  *See table(s) above for measurements and observations. Electronically signed by Harold Barban MD on 11/01/2020 at 5:32:15 PM.    Final     Procedures Procedures (including critical care time)  Medications Ordered in ED Medications - No data to display  ED Course  I have reviewed the triage vital signs and the nursing notes.  Pertinent labs & imaging results that were  available during my care of the patient were reviewed by me and considered in my medical decision making (see chart for details).    MDM Rules/Calculators/A&P                          Alert 74 year old female in no acute distress with past medical history of GERD, hypertension, diabetes.  Presents with complaint of right lower leg pain and swelling.  Patient symptoms began on Tuesday 10/28/20, has been constant since then, describes her pain as an aching, it is worse with weightbearing and improved with elevation as well as Norco.  Was evaluated at [facility] 12/2, x-rays showed no fracture, patient was given a cam boot and told to follow-up for DVT study the following day (as study could not be completed at that time) and Ortho in the future.  She reports that she did not go for the DVT study.  She came to the hospital today as her symptoms have not improved and she has limited help at home.  Patient reports that she has been less mobile due to chronic right knee pain and more recently her right lower leg pain.  She denies any history of DVT or PE.  Patient denies any shortness of breath, chest pain, hemoptysis, cough, fever, chills, or syncopal episodes.  Patient was transferred to exam chair via stand pivot with 2 person assist.  She complains of tenderness to right calf, does have swelling in her right calf and bilateral right ankle.  Patient has full range of motion, pulses intact, leg is warm to the touch, no erythema, no palpable tendon defect, Thompson sign negative.    Due to patient's right lower leg swelling,  Right lower leg pain and decrease in mobilization, DVT ultrasound was ordered and results are pending.    Results from ultrasound showed no evidence of deep vein thrombosis in the lower extremity.  Patient was given results of the ultrasound.  She was given information follow-up with orthopedist and call them on Monday to schedule an appointment.  She was given strict return  precautions.  Patient expressed understanding of all instructions.       Final Clinical Impression(s) / ED Diagnoses Final diagnoses:  Pain and swelling of right lower leg    Rx / DC Orders ED Discharge Orders    None       [name] [name] 11/01/20 2329    Milton Ferguson, MD 11/02/20 [phone]

## 2020-11-01 NOTE — Progress Notes (Signed)
Right lower extremity venous duplex completed. Refer to "CV Proc" under chart review to view preliminary results.  11/01/2020 2:40 PM Kelby Aline., MHA, RVT, RDCS, RDMS

## 2020-11-01 NOTE — Discharge Instructions (Addendum)
You came to the urgency department today to have your right lower leg swelling and pain evaluated.  You had a ultrasound of your right leg which did not show a DVT.  You are to continue using the cam walker boot.  You are to follow-up with the orthopedic doctor for further evaluation of your right lower leg pain.  I have given you the information for Dr. Griffin Basil who is an orthopedic specialist.  You have an orthopedic specialist already please contact them.  You may continue to use the Norco pain medication he received earlier this week.  Please use this medication as prescribed, do not with alcohol, do not drive or operate heavy injury while on this medication.    Get help right away if: You have: New or increased pain, swelling, or redness in an arm or leg. Numbness or tingling in an arm or leg. Shortness of breath. Chest pain. A rapid or irregular heartbeat. A severe headache or confusion. A cut that will not stop bleeding. There is blood in your vomit, stool, or urine. You have a serious fall or accident, or you hit your head. You feel light-headed or dizzy. You cough up blood.

## 2020-11-01 NOTE — ED Triage Notes (Signed)
Pt presents with c/o right ankle pain. Pt reports the pain started on Tuesday night, seen for same 2 days later. Pt reports she is unable to put weight on that leg.

## 2021-02-03 ENCOUNTER — Inpatient Hospital Stay (HOSPITAL_COMMUNITY)
Admission: EM | Admit: 2021-02-03 | Discharge: 2021-02-08 | DRG: 638 | Disposition: A | Discharge status: Home Health | Payer: Medicare HMO | Attending: Internal Medicine | Admitting: Internal Medicine

## 2021-02-03 DIAGNOSIS — R680 Hypothermia, not associated with low environmental temperature: Secondary | ICD-10-CM | POA: Diagnosis present

## 2021-02-03 DIAGNOSIS — F419 Anxiety disorder, unspecified: Secondary | ICD-10-CM | POA: Diagnosis present

## 2021-02-03 DIAGNOSIS — I959 Hypotension, unspecified: Secondary | ICD-10-CM

## 2021-02-03 DIAGNOSIS — I4891 Unspecified atrial fibrillation: Secondary | ICD-10-CM | POA: Diagnosis present

## 2021-02-03 DIAGNOSIS — Z6839 Body mass index (BMI) 39.0-39.9, adult: Secondary | ICD-10-CM

## 2021-02-03 DIAGNOSIS — Z90710 Acquired absence of both cervix and uterus: Secondary | ICD-10-CM

## 2021-02-03 DIAGNOSIS — N179 Acute kidney failure, unspecified: Secondary | ICD-10-CM | POA: Diagnosis present

## 2021-02-03 DIAGNOSIS — Z20822 Contact with and (suspected) exposure to covid-19: Secondary | ICD-10-CM | POA: Diagnosis present

## 2021-02-03 DIAGNOSIS — R296 Repeated falls: Secondary | ICD-10-CM | POA: Diagnosis present

## 2021-02-03 DIAGNOSIS — Z87891 Personal history of nicotine dependence: Secondary | ICD-10-CM

## 2021-02-03 DIAGNOSIS — M1990 Unspecified osteoarthritis, unspecified site: Secondary | ICD-10-CM | POA: Diagnosis present

## 2021-02-03 DIAGNOSIS — Z85038 Personal history of other malignant neoplasm of large intestine: Secondary | ICD-10-CM

## 2021-02-03 DIAGNOSIS — S8002XA Contusion of left knee, initial encounter: Secondary | ICD-10-CM | POA: Diagnosis present

## 2021-02-03 DIAGNOSIS — E11649 Type 2 diabetes mellitus with hypoglycemia without coma: Principal | ICD-10-CM | POA: Diagnosis present

## 2021-02-03 DIAGNOSIS — I10 Essential (primary) hypertension: Secondary | ICD-10-CM | POA: Diagnosis present

## 2021-02-03 DIAGNOSIS — R0602 Shortness of breath: Secondary | ICD-10-CM

## 2021-02-03 DIAGNOSIS — D509 Iron deficiency anemia, unspecified: Secondary | ICD-10-CM | POA: Diagnosis present

## 2021-02-03 DIAGNOSIS — E876 Hypokalemia: Secondary | ICD-10-CM | POA: Diagnosis present

## 2021-02-03 DIAGNOSIS — S20219A Contusion of unspecified front wall of thorax, initial encounter: Secondary | ICD-10-CM

## 2021-02-03 DIAGNOSIS — E669 Obesity, unspecified: Secondary | ICD-10-CM

## 2021-02-03 DIAGNOSIS — E785 Hyperlipidemia, unspecified: Secondary | ICD-10-CM | POA: Diagnosis present

## 2021-02-03 DIAGNOSIS — Z96642 Presence of left artificial hip joint: Secondary | ICD-10-CM | POA: Diagnosis present

## 2021-02-03 DIAGNOSIS — Z79899 Other long term (current) drug therapy: Secondary | ICD-10-CM

## 2021-02-03 DIAGNOSIS — W19XXXA Unspecified fall, initial encounter: Secondary | ICD-10-CM | POA: Diagnosis present

## 2021-02-03 DIAGNOSIS — T68XXXA Hypothermia, initial encounter: Secondary | ICD-10-CM

## 2021-02-03 DIAGNOSIS — I350 Nonrheumatic aortic (valve) stenosis: Secondary | ICD-10-CM | POA: Diagnosis present

## 2021-02-03 DIAGNOSIS — E1159 Type 2 diabetes mellitus with other circulatory complications: Secondary | ICD-10-CM

## 2021-02-03 DIAGNOSIS — S2231XA Fracture of one rib, right side, initial encounter for closed fracture: Secondary | ICD-10-CM | POA: Diagnosis present

## 2021-02-03 DIAGNOSIS — M25519 Pain in unspecified shoulder: Secondary | ICD-10-CM

## 2021-02-03 DIAGNOSIS — Z7982 Long term (current) use of aspirin: Secondary | ICD-10-CM

## 2021-02-03 DIAGNOSIS — K219 Gastro-esophageal reflux disease without esophagitis: Secondary | ICD-10-CM | POA: Diagnosis present

## 2021-02-03 DIAGNOSIS — G4733 Obstructive sleep apnea (adult) (pediatric): Secondary | ICD-10-CM | POA: Diagnosis present

## 2021-02-03 DIAGNOSIS — E162 Hypoglycemia, unspecified: Secondary | ICD-10-CM

## 2021-02-03 DIAGNOSIS — Z885 Allergy status to narcotic agent status: Secondary | ICD-10-CM

## 2021-02-03 DIAGNOSIS — E860 Dehydration: Secondary | ICD-10-CM | POA: Diagnosis present

## 2021-02-03 DIAGNOSIS — Z66 Do not resuscitate: Secondary | ICD-10-CM | POA: Diagnosis present

## 2021-02-03 DIAGNOSIS — Z794 Long term (current) use of insulin: Secondary | ICD-10-CM

## 2021-02-03 DIAGNOSIS — D649 Anemia, unspecified: Secondary | ICD-10-CM

## 2021-02-03 NOTE — ED Triage Notes (Signed)
Patient from home, presented with [facility], found with a sugar of 35 and was given 25gm of D10.  She was found in Afib, rate 80-120.  Initially 140-150.  Last sugar check of 120.  Patient took a total of 16 units of Novalin at some point today.  Family found her obtunded before EMS arrival.  Patient is only alert to self and event at this time.

## 2021-02-04 ENCOUNTER — Encounter (HOSPITAL_COMMUNITY): Payer: Self-pay | Admitting: Emergency Medicine

## 2021-02-04 ENCOUNTER — Inpatient Hospital Stay (HOSPITAL_COMMUNITY): Payer: Medicare HMO

## 2021-02-04 ENCOUNTER — Other Ambulatory Visit: Payer: Self-pay

## 2021-02-04 ENCOUNTER — Emergency Department (HOSPITAL_COMMUNITY): Payer: Medicare HMO

## 2021-02-04 DIAGNOSIS — I4891 Unspecified atrial fibrillation: Secondary | ICD-10-CM | POA: Diagnosis present

## 2021-02-04 DIAGNOSIS — Z66 Do not resuscitate: Secondary | ICD-10-CM | POA: Diagnosis present

## 2021-02-04 DIAGNOSIS — E785 Hyperlipidemia, unspecified: Secondary | ICD-10-CM | POA: Diagnosis present

## 2021-02-04 DIAGNOSIS — N179 Acute kidney failure, unspecified: Secondary | ICD-10-CM | POA: Diagnosis present

## 2021-02-04 DIAGNOSIS — G4733 Obstructive sleep apnea (adult) (pediatric): Secondary | ICD-10-CM | POA: Diagnosis present

## 2021-02-04 DIAGNOSIS — M1990 Unspecified osteoarthritis, unspecified site: Secondary | ICD-10-CM | POA: Diagnosis present

## 2021-02-04 DIAGNOSIS — Z20822 Contact with and (suspected) exposure to covid-19: Secondary | ICD-10-CM | POA: Diagnosis present

## 2021-02-04 DIAGNOSIS — Z85038 Personal history of other malignant neoplasm of large intestine: Secondary | ICD-10-CM | POA: Diagnosis not present

## 2021-02-04 DIAGNOSIS — D509 Iron deficiency anemia, unspecified: Secondary | ICD-10-CM | POA: Diagnosis present

## 2021-02-04 DIAGNOSIS — T68XXXA Hypothermia, initial encounter: Secondary | ICD-10-CM

## 2021-02-04 DIAGNOSIS — I10 Essential (primary) hypertension: Secondary | ICD-10-CM | POA: Diagnosis present

## 2021-02-04 DIAGNOSIS — I350 Nonrheumatic aortic (valve) stenosis: Secondary | ICD-10-CM | POA: Diagnosis present

## 2021-02-04 DIAGNOSIS — R680 Hypothermia, not associated with low environmental temperature: Secondary | ICD-10-CM | POA: Diagnosis present

## 2021-02-04 DIAGNOSIS — S8002XA Contusion of left knee, initial encounter: Secondary | ICD-10-CM | POA: Diagnosis present

## 2021-02-04 DIAGNOSIS — W19XXXA Unspecified fall, initial encounter: Secondary | ICD-10-CM | POA: Diagnosis present

## 2021-02-04 DIAGNOSIS — Z96642 Presence of left artificial hip joint: Secondary | ICD-10-CM | POA: Diagnosis present

## 2021-02-04 DIAGNOSIS — K219 Gastro-esophageal reflux disease without esophagitis: Secondary | ICD-10-CM | POA: Diagnosis present

## 2021-02-04 DIAGNOSIS — E162 Hypoglycemia, unspecified: Secondary | ICD-10-CM

## 2021-02-04 DIAGNOSIS — E11649 Type 2 diabetes mellitus with hypoglycemia without coma: Secondary | ICD-10-CM | POA: Diagnosis present

## 2021-02-04 DIAGNOSIS — E860 Dehydration: Secondary | ICD-10-CM | POA: Diagnosis present

## 2021-02-04 DIAGNOSIS — F419 Anxiety disorder, unspecified: Secondary | ICD-10-CM | POA: Diagnosis present

## 2021-02-04 DIAGNOSIS — I959 Hypotension, unspecified: Secondary | ICD-10-CM

## 2021-02-04 DIAGNOSIS — S2231XA Fracture of one rib, right side, initial encounter for closed fracture: Secondary | ICD-10-CM | POA: Diagnosis present

## 2021-02-04 DIAGNOSIS — E876 Hypokalemia: Secondary | ICD-10-CM | POA: Diagnosis present

## 2021-02-04 DIAGNOSIS — R296 Repeated falls: Secondary | ICD-10-CM | POA: Diagnosis present

## 2021-02-04 LAB — COMPREHENSIVE METABOLIC PANEL
ALT: 38 U/L (ref 0–44)
AST: 61 U/L — ABNORMAL HIGH (ref 15–41)
Albumin: 3 g/dL — ABNORMAL LOW (ref 3.5–5.0)
Alkaline Phosphatase: 48 U/L (ref 38–126)
Anion gap: 11 (ref 5–15)
BUN: 25 mg/dL — ABNORMAL HIGH (ref 8–23)
CO2: 21 mmol/L — ABNORMAL LOW (ref 22–32)
Calcium: 8.6 mg/dL — ABNORMAL LOW (ref 8.9–10.3)
Chloride: 105 mmol/L (ref 98–111)
Creatinine, Ser: 1.11 mg/dL — ABNORMAL HIGH (ref 0.44–1.00)
GFR, Estimated: 52 mL/min — ABNORMAL LOW (ref >60)
Glucose, Bld: 46 mg/dL — ABNORMAL LOW (ref 70–99)
Potassium: 3.3 mmol/L — ABNORMAL LOW (ref 3.5–5.1)
Sodium: 137 mmol/L (ref 135–145)
Total Bilirubin: 0.8 mg/dL (ref 0.3–1.2)
Total Protein: 5.8 g/dL — ABNORMAL LOW (ref 6.5–8.1)

## 2021-02-04 LAB — BASIC METABOLIC PANEL
Anion gap: 10 (ref 5–15)
BUN: 22 mg/dL (ref 8–23)
CO2: 20 mmol/L — ABNORMAL LOW (ref 22–32)
Calcium: 8.1 mg/dL — ABNORMAL LOW (ref 8.9–10.3)
Chloride: 105 mmol/L (ref 98–111)
Creatinine, Ser: 0.91 mg/dL (ref 0.44–1.00)
GFR, Estimated: >60 mL/min (ref >60)
Glucose, Bld: 81 mg/dL (ref 70–99)
Potassium: 3.9 mmol/L (ref 3.5–5.1)
Sodium: 135 mmol/L (ref 135–145)

## 2021-02-04 LAB — CBC WITH DIFFERENTIAL/PLATELET
Abs Immature Granulocytes: 0.01 10*3/uL (ref 0.00–0.07)
Basophils Absolute: 0 10*3/uL (ref 0.0–0.1)
Basophils Relative: 0 %
Eosinophils Absolute: 0.2 10*3/uL (ref 0.0–0.5)
Eosinophils Relative: 2 %
HCT: 36 % (ref 36.0–46.0)
Hemoglobin: 11.5 g/dL — ABNORMAL LOW (ref 12.0–15.0)
Immature Granulocytes: 0 %
Lymphocytes Relative: 29 %
Lymphs Abs: 2 10*3/uL (ref 0.7–4.0)
MCH: 30.7 pg (ref 26.0–34.0)
MCHC: 31.9 g/dL (ref 30.0–36.0)
MCV: 96 fL (ref 80.0–100.0)
Monocytes Absolute: 0.8 10*3/uL (ref 0.1–1.0)
Monocytes Relative: 11 %
Neutro Abs: 3.9 10*3/uL (ref 1.7–7.7)
Neutrophils Relative %: 58 %
Platelets: 207 10*3/uL (ref 150–400)
RBC: 3.75 MIL/uL — ABNORMAL LOW (ref 3.87–5.11)
RDW: 14.5 % (ref 11.5–15.5)
WBC: 6.8 10*3/uL (ref 4.0–10.5)
nRBC: 0 % (ref 0.0–0.2)

## 2021-02-04 LAB — D-DIMER, QUANTITATIVE: D-Dimer, Quant: 0.53 ug/mL-FEU — ABNORMAL HIGH (ref 0.00–0.50)

## 2021-02-04 LAB — CBG MONITORING, ED
Glucose-Capillary: 101 mg/dL — ABNORMAL HIGH (ref 70–99)
Glucose-Capillary: 102 mg/dL — ABNORMAL HIGH (ref 70–99)
Glucose-Capillary: 104 mg/dL — ABNORMAL HIGH (ref 70–99)
Glucose-Capillary: 115 mg/dL — ABNORMAL HIGH (ref 70–99)
Glucose-Capillary: 118 mg/dL — ABNORMAL HIGH (ref 70–99)
Glucose-Capillary: 67 mg/dL — ABNORMAL LOW (ref 70–99)
Glucose-Capillary: 71 mg/dL (ref 70–99)
Glucose-Capillary: 83 mg/dL (ref 70–99)
Glucose-Capillary: 84 mg/dL (ref 70–99)
Glucose-Capillary: 85 mg/dL (ref 70–99)
Glucose-Capillary: 87 mg/dL (ref 70–99)
Glucose-Capillary: 88 mg/dL (ref 70–99)
Glucose-Capillary: 99 mg/dL (ref 70–99)

## 2021-02-04 LAB — HEPARIN LEVEL (UNFRACTIONATED)
Heparin Unfractionated: 0.1 IU/mL — ABNORMAL LOW (ref 0.30–0.70)
Heparin Unfractionated: 0.23 IU/mL — ABNORMAL LOW (ref 0.30–0.70)

## 2021-02-04 LAB — TSH: TSH: 0.748 u[IU]/mL (ref 0.350–4.500)

## 2021-02-04 LAB — ECHOCARDIOGRAM COMPLETE
Height: 62 in
Weight: 3680 oz

## 2021-02-04 LAB — GLUCOSE, CAPILLARY
Glucose-Capillary: 110 mg/dL — ABNORMAL HIGH (ref 70–99)
Glucose-Capillary: 161 mg/dL — ABNORMAL HIGH (ref 70–99)
Glucose-Capillary: 124 mg/dL — ABNORMAL HIGH (ref 70–99)

## 2021-02-04 LAB — MAGNESIUM
Magnesium: 1.5 mg/dL — ABNORMAL LOW (ref 1.7–2.4)
Magnesium: 1.8 mg/dL (ref 1.7–2.4)

## 2021-02-04 LAB — PROCALCITONIN: Procalcitonin: <0.1 ng/mL

## 2021-02-04 LAB — SARS CORONAVIRUS 2 (TAT 6-24 HRS): SARS Coronavirus 2: NEGATIVE

## 2021-02-04 LAB — CORTISOL-AM, BLOOD: Cortisol - AM: 9.4 ug/dL (ref 6.7–22.6)

## 2021-02-04 LAB — LACTIC ACID, PLASMA: Lactic Acid, Venous: 1.4 mmol/L (ref 0.5–1.9)

## 2021-02-04 IMAGING — CT CT HEAD W/O CM
4 of 5 series · 15 of 47 positions shown, 17 images · non-contrast
Comparison: None.

CLINICAL DATA: 75-year-old female status post loss of
consciousness. Hypoglycemic (blood glucose of 35). Atrial
fibrillation on anticoagulation. Recent fall.

EXAM:
CT HEAD WITHOUT CONTRAST
TECHNIQUE: Contiguous axial images were obtained from the base of the skull
through the vertex without intravenous contrast.

[Series 3: head wo · axial · 0.40mm/px · z∈[-150,-40]mm · 5 of 34 slices shown, 7 images (1 of 2)]
[im 6/34  brain]
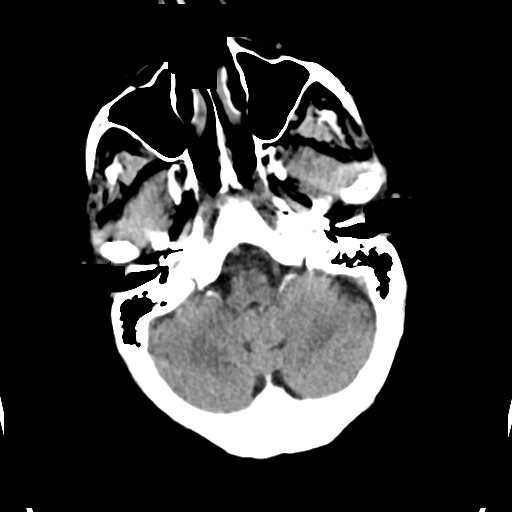
[im 6/34  bone]
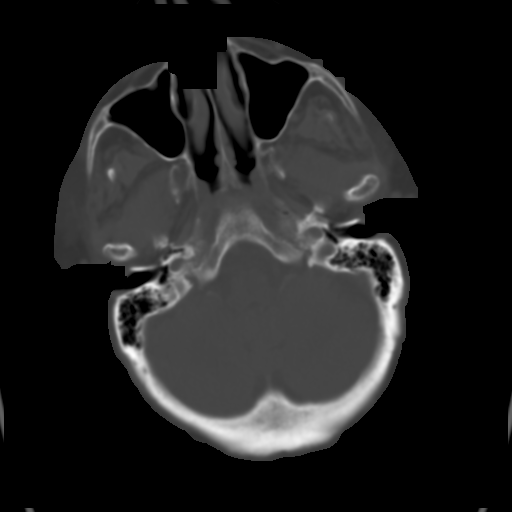
[im 12/34  brain]
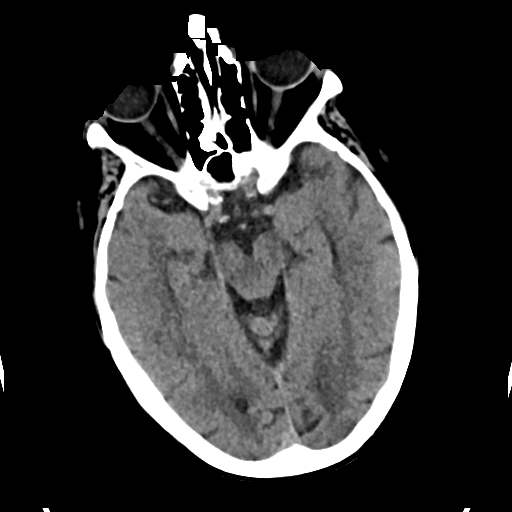
[im 17/34  brain]
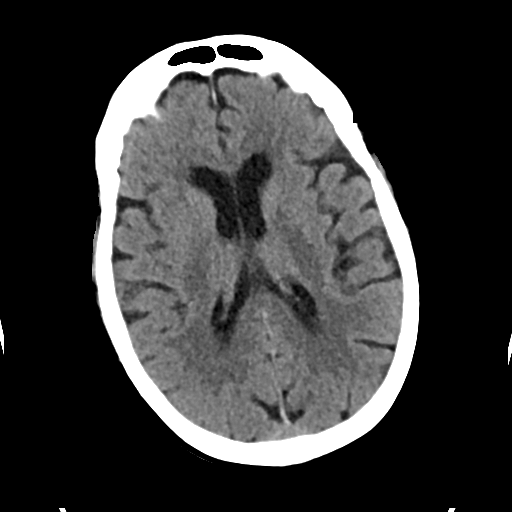
[im 23/34  brain]
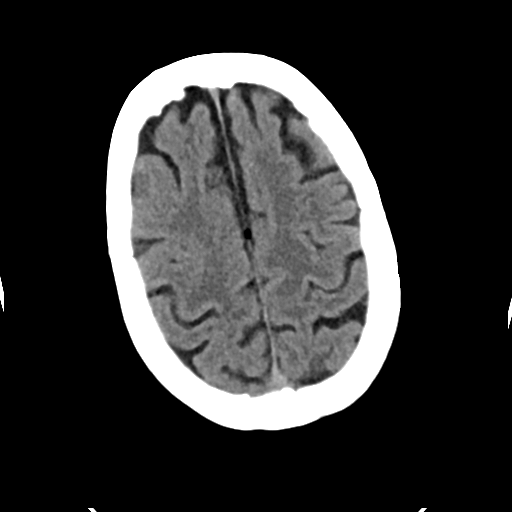
[im 28/34  brain]
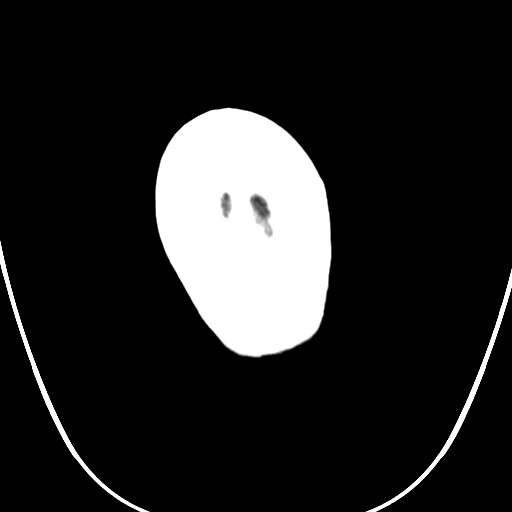
[im 28/34  bone]
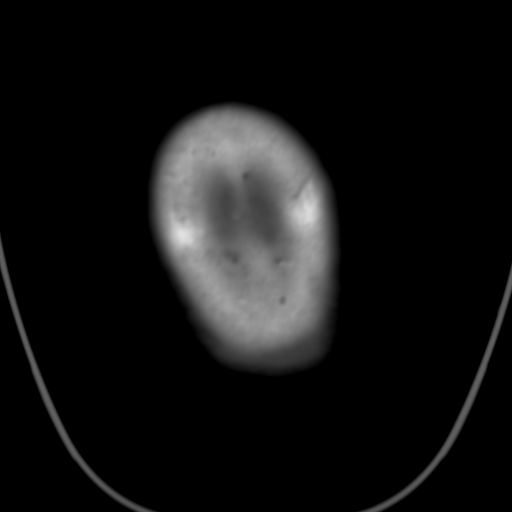

[Series 5: cor soft · coronal · 0.29mm/px · 3 of 68 slices shown]
[im 23/68  brain]
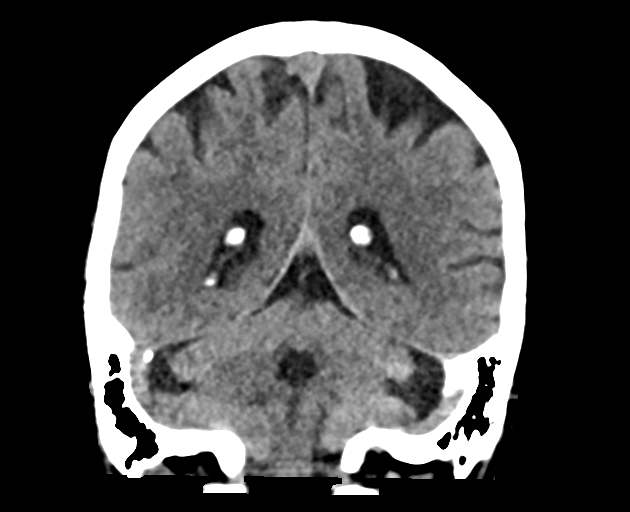
[im 30/68  brain]
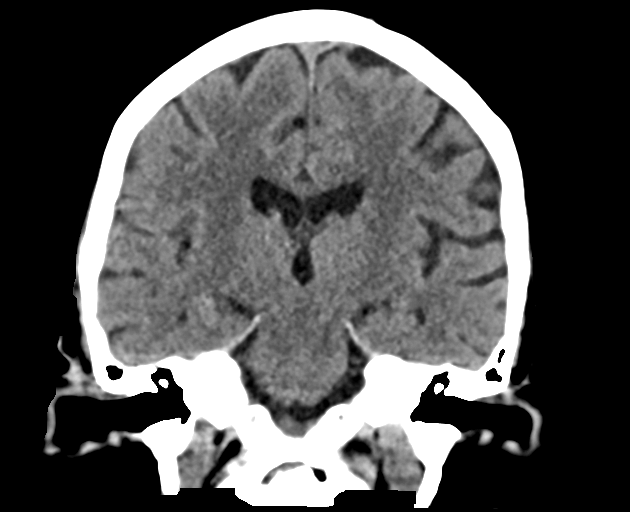
[im 38/68  brain]
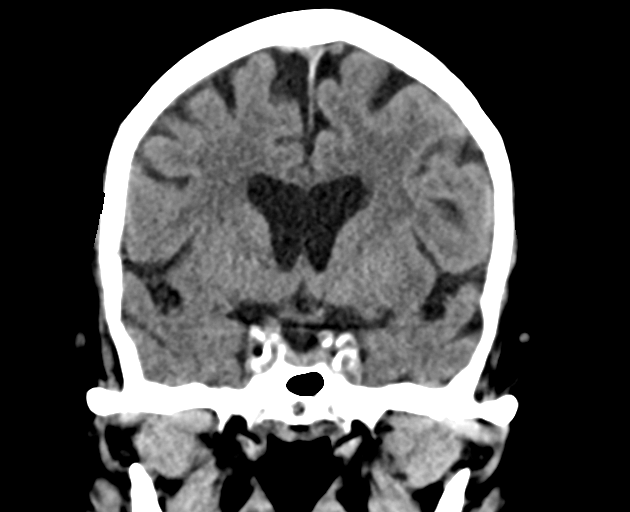

[Series 6: sag soft · sagittal · 0.29mm/px · 3 of 49 slices shown]
[im 17/49  brain]
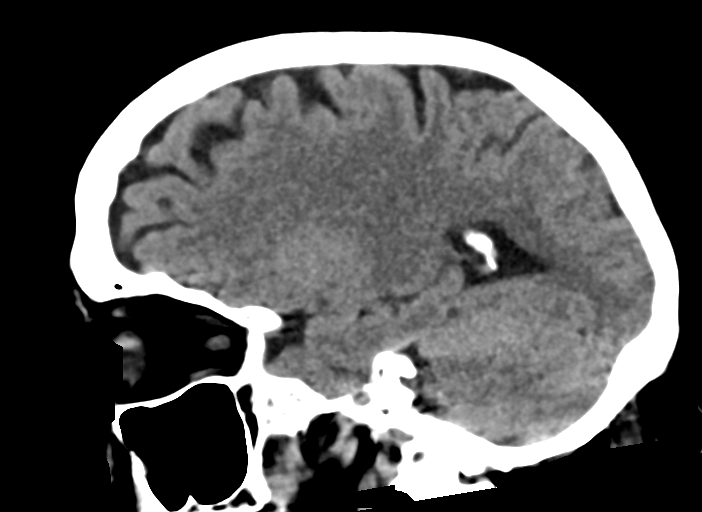
[im 25/49  brain]
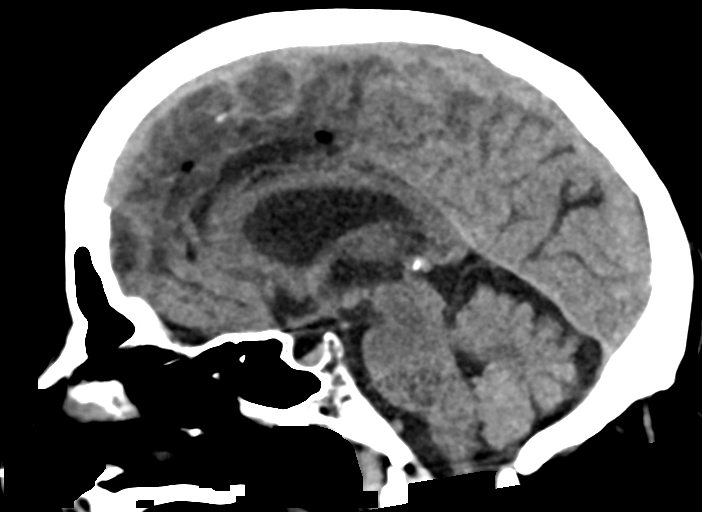
[im 33/49  brain]
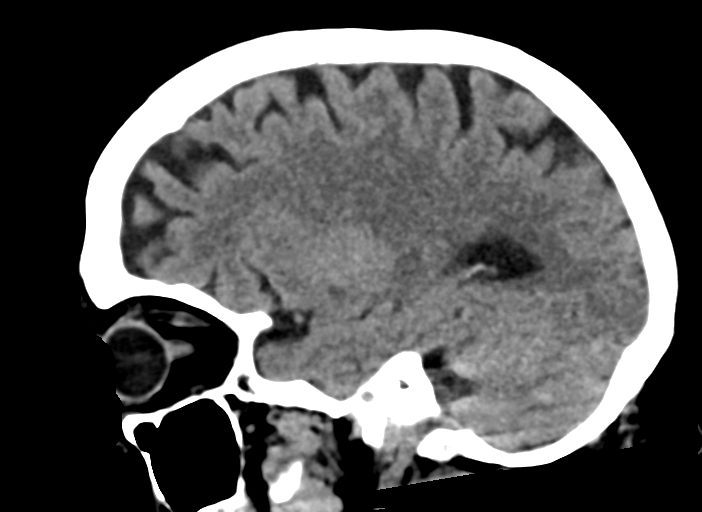

[Series 7: head wo · axial · 0.36mm/px · z∈[-132,-43]mm · 4 of 30 slices shown (2 of 2)]
[im 6/30  brain]
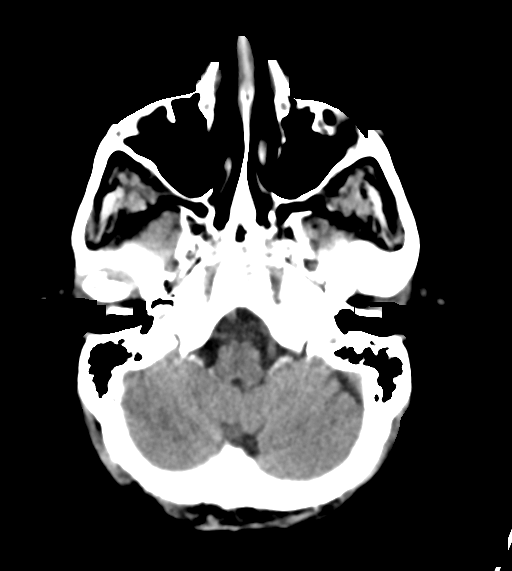
[im 12/30  brain]
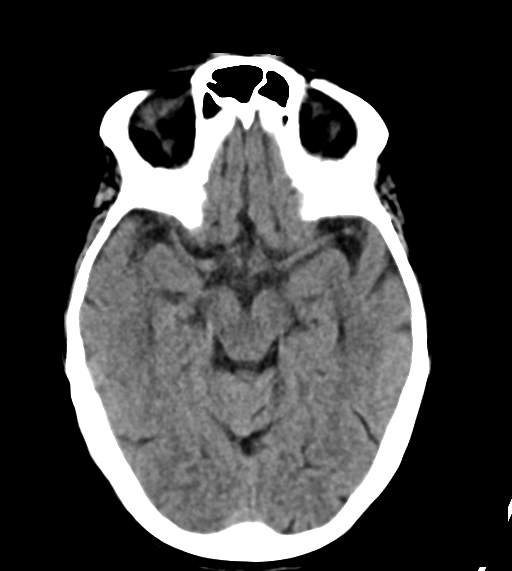
[im 18/30  brain]
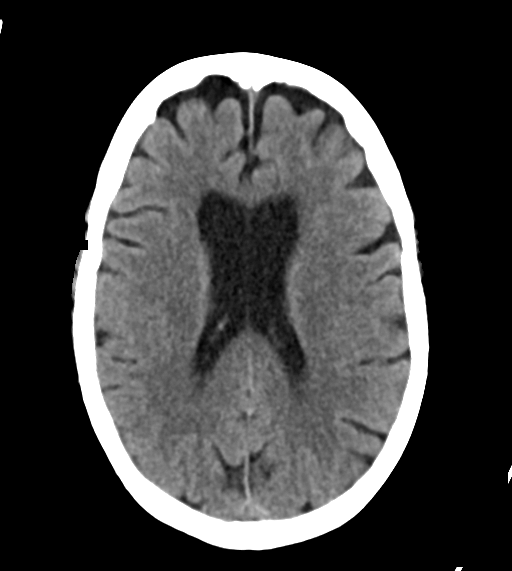
[im 24/30  brain]
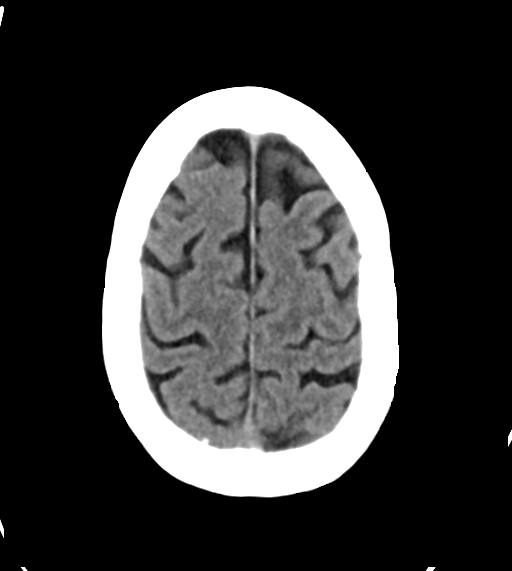

[15 of 47 positions shown; findings below may reference images not displayed]

FINDINGS: Brain: Cerebral volume is within normal limits for age. No midline
shift, ventriculomegaly, mass effect, evidence of mass lesion,
intracranial hemorrhage or evidence of cortically based acute
infarction. Gray-white matter differentiation is within normal
limits throughout the brain.

Vascular: Calcified atherosclerosis at the skull base. No suspicious
intracranial vascular hyperdensity.

Skull: Intact, negative.

Sinuses/Orbits: Scattered mild paranasal sinus mucosal thickening.
No sinus fluid levels. Tympanic cavities and mastoids are clear.

Other: No orbit or scalp soft tissue injury identified.
IMPRESSION: No acute traumatic injury identified. Normal for age non contrast CT
appearance of the brain.

## 2021-02-04 IMAGING — DX DG CHEST 1V PORT
1 series · 1 of 1 positions shown · non-contrast
Comparison: [DATE]

CLINICAL DATA: Atrial fibrillation with altered level consciousness

EXAM:
PORTABLE CHEST 1 VIEW

[chest ap]
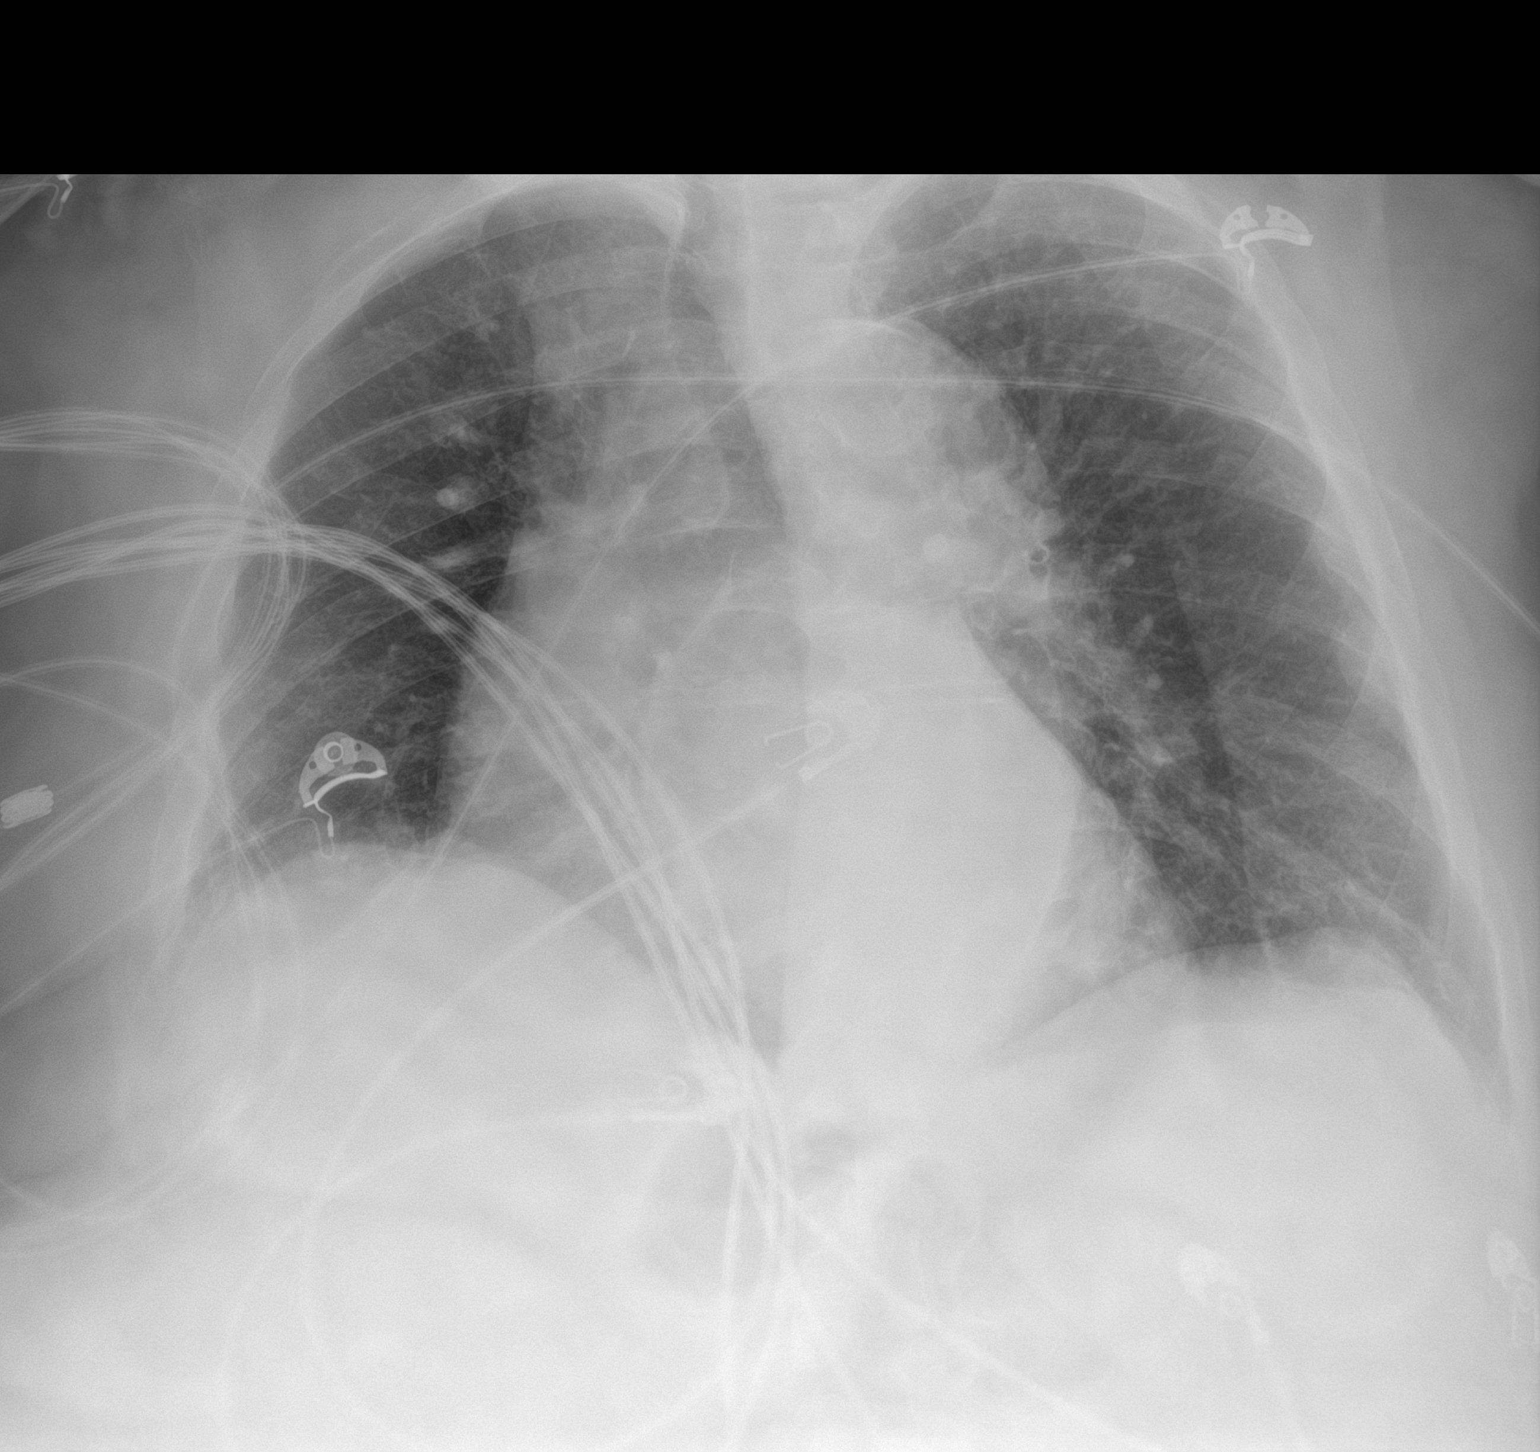

[1 of 1 positions shown; findings below may reference images not displayed]

FINDINGS: Cardiac shadow is within normal limits. Tortuous thoracic aorta is
noted. Lungs are well aerated bilaterally. No focal infiltrate or
sizable effusion is seen. No bony abnormality is noted.
IMPRESSION: No acute abnormality noted.

## 2021-02-04 IMAGING — CR DG SHOULDER 2+V*R*
3 series · 3 of 3 positions shown · non-contrast
Comparison: Chest and rib series [DATE].

CLINICAL DATA: Rib contusion.  Shoulder pain.

EXAM:
RIGHT SHOULDER - 2+ VIEW

[shoulder grashey]
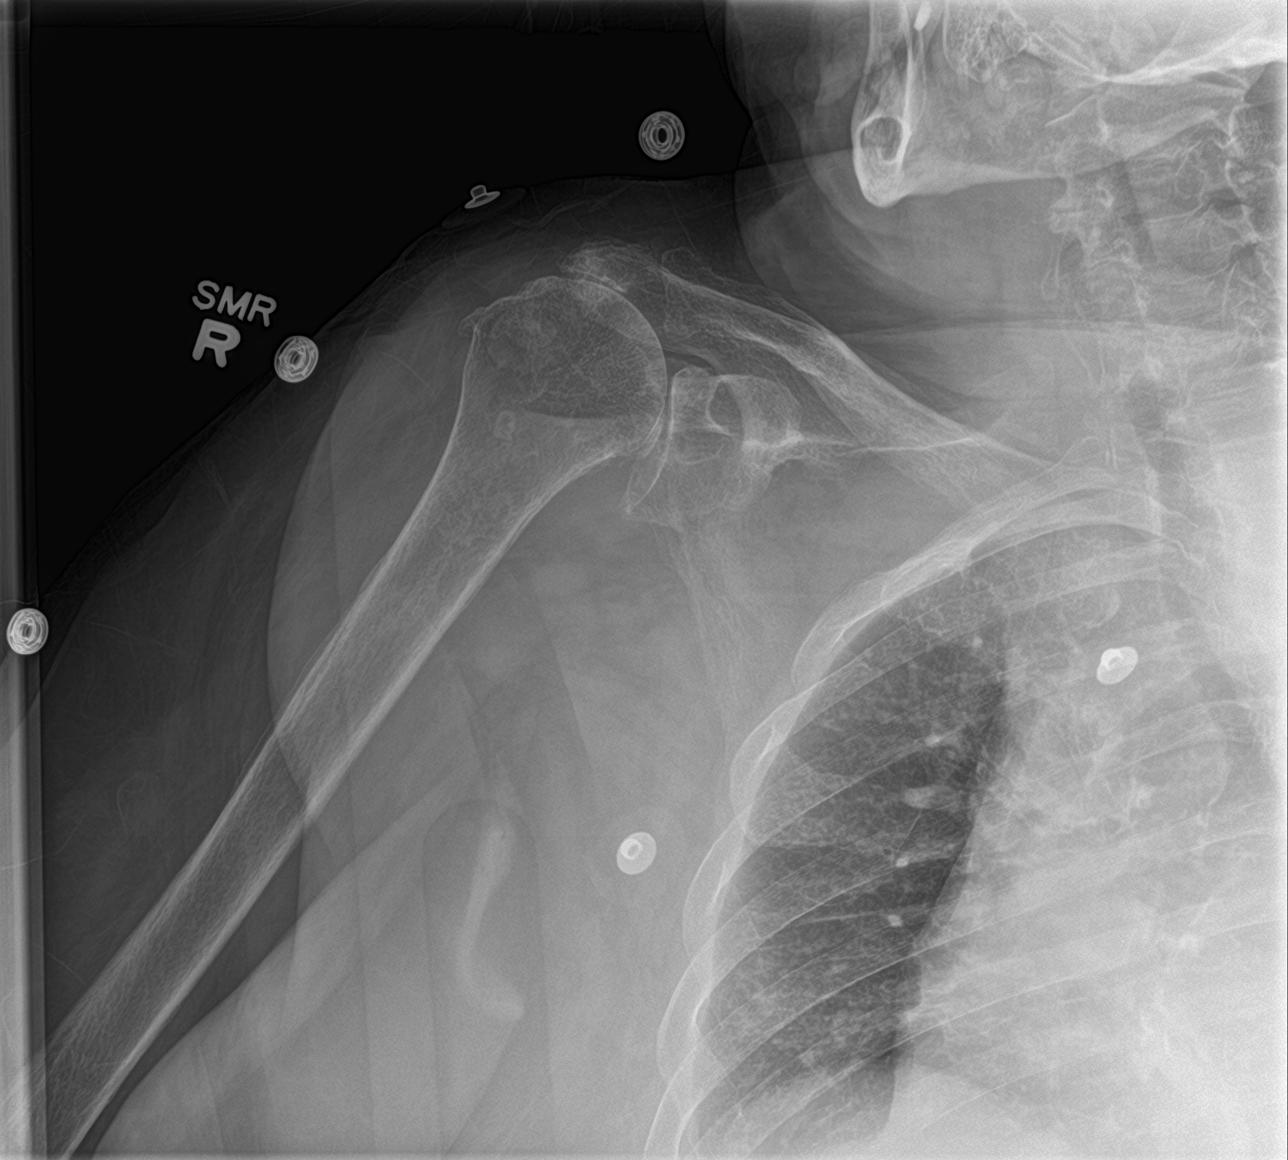

[shoulder y view]
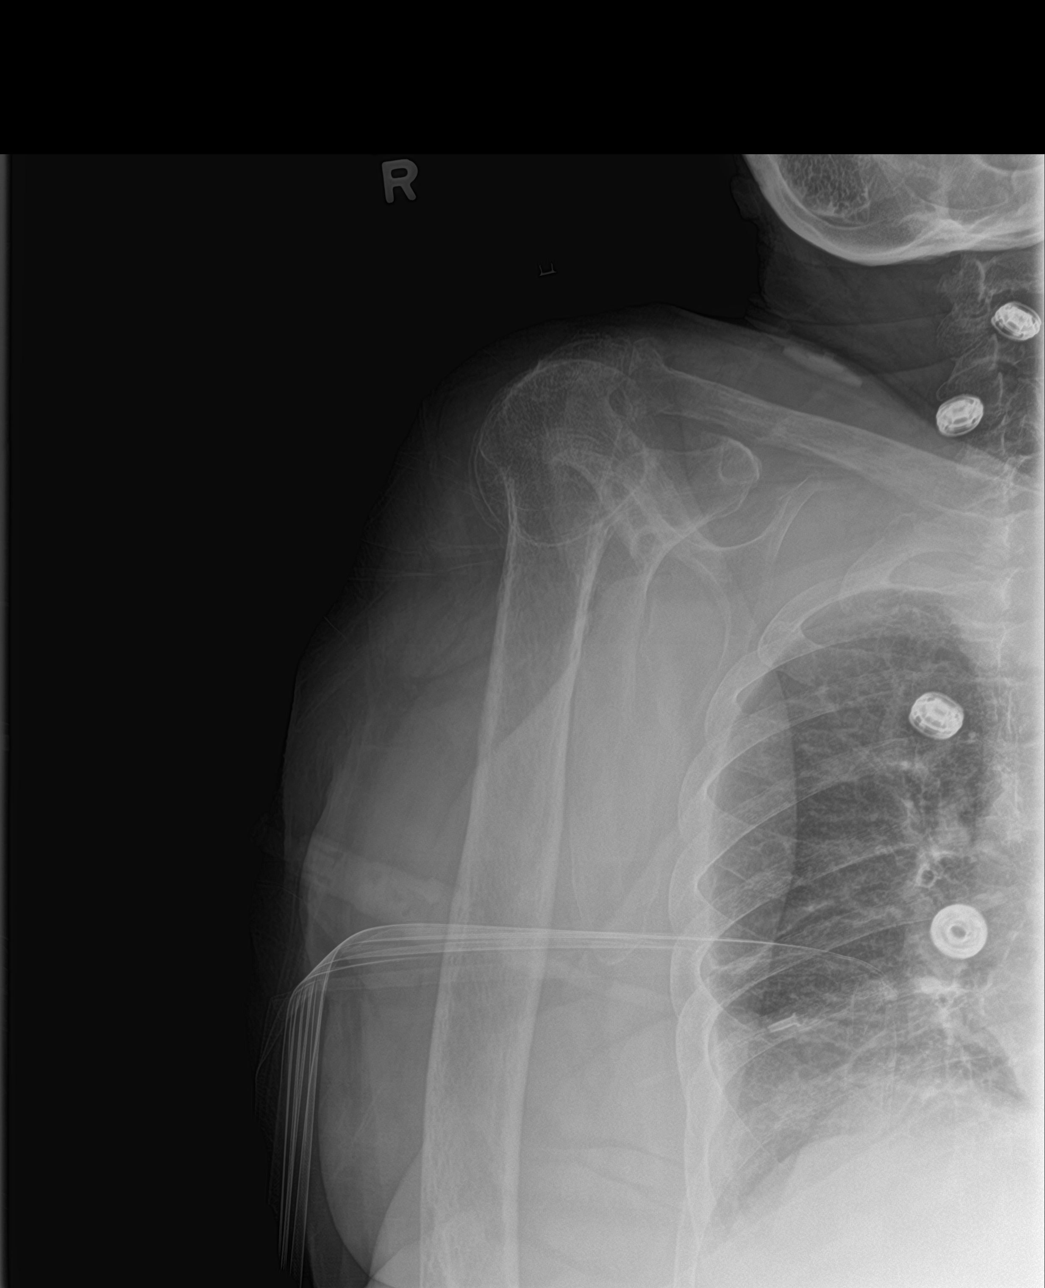

[shoulder ap neutral]
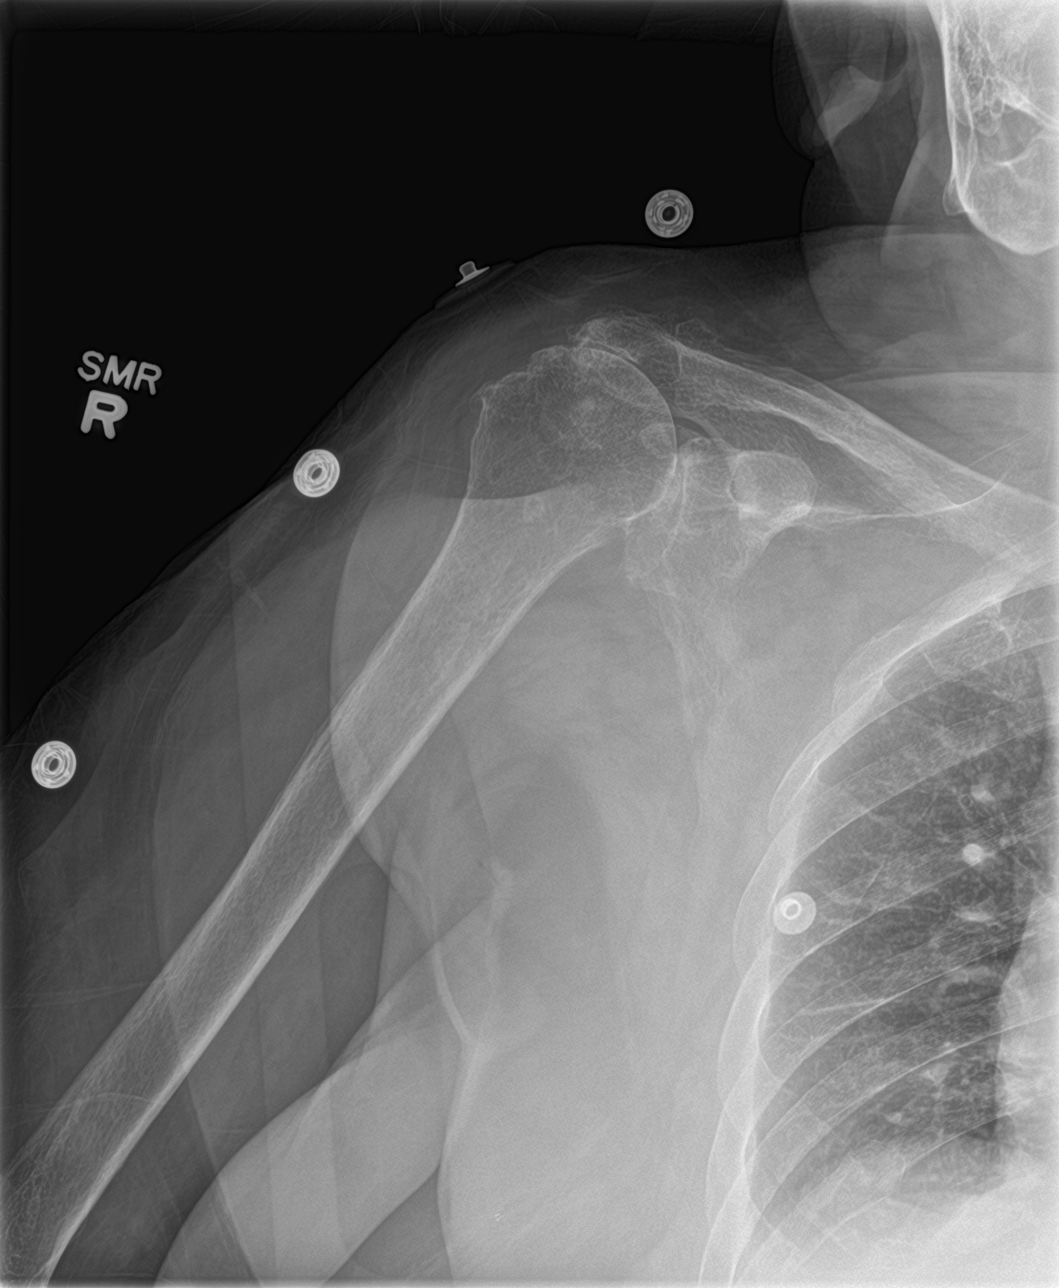

[3 of 3 positions shown; findings below may reference images not displayed]

FINDINGS: Diffuse osteopenia. Acromioclavicular and glenohumeral degenerative
change. Posterior subluxation of the right shoulder cannot be
excluded. No evidence of fracture. No evidence of separation.
High-riding right shoulder consistent with chronic rotator cuff
tear.
IMPRESSION: 1. Diffuse osteopenia. Acromioclavicular and glenohumeral
degenerative change. High-riding shoulder consistent with chronic
rotator cuff tear.

2. Posterior subluxation of the right shoulder cannot be excluded.
No evidence of fracture or separation.

## 2021-02-04 IMAGING — CR DG RIBS 2V*R*
2 series · 2 of 2 positions shown · non-contrast
Comparison: Portable chest.  [J7] hours today and earlier

CLINICAL DATA: 75-year-old female with rib contusion, pain.

EXAM:
RIGHT RIBS - 2 VIEW

[rib ap]
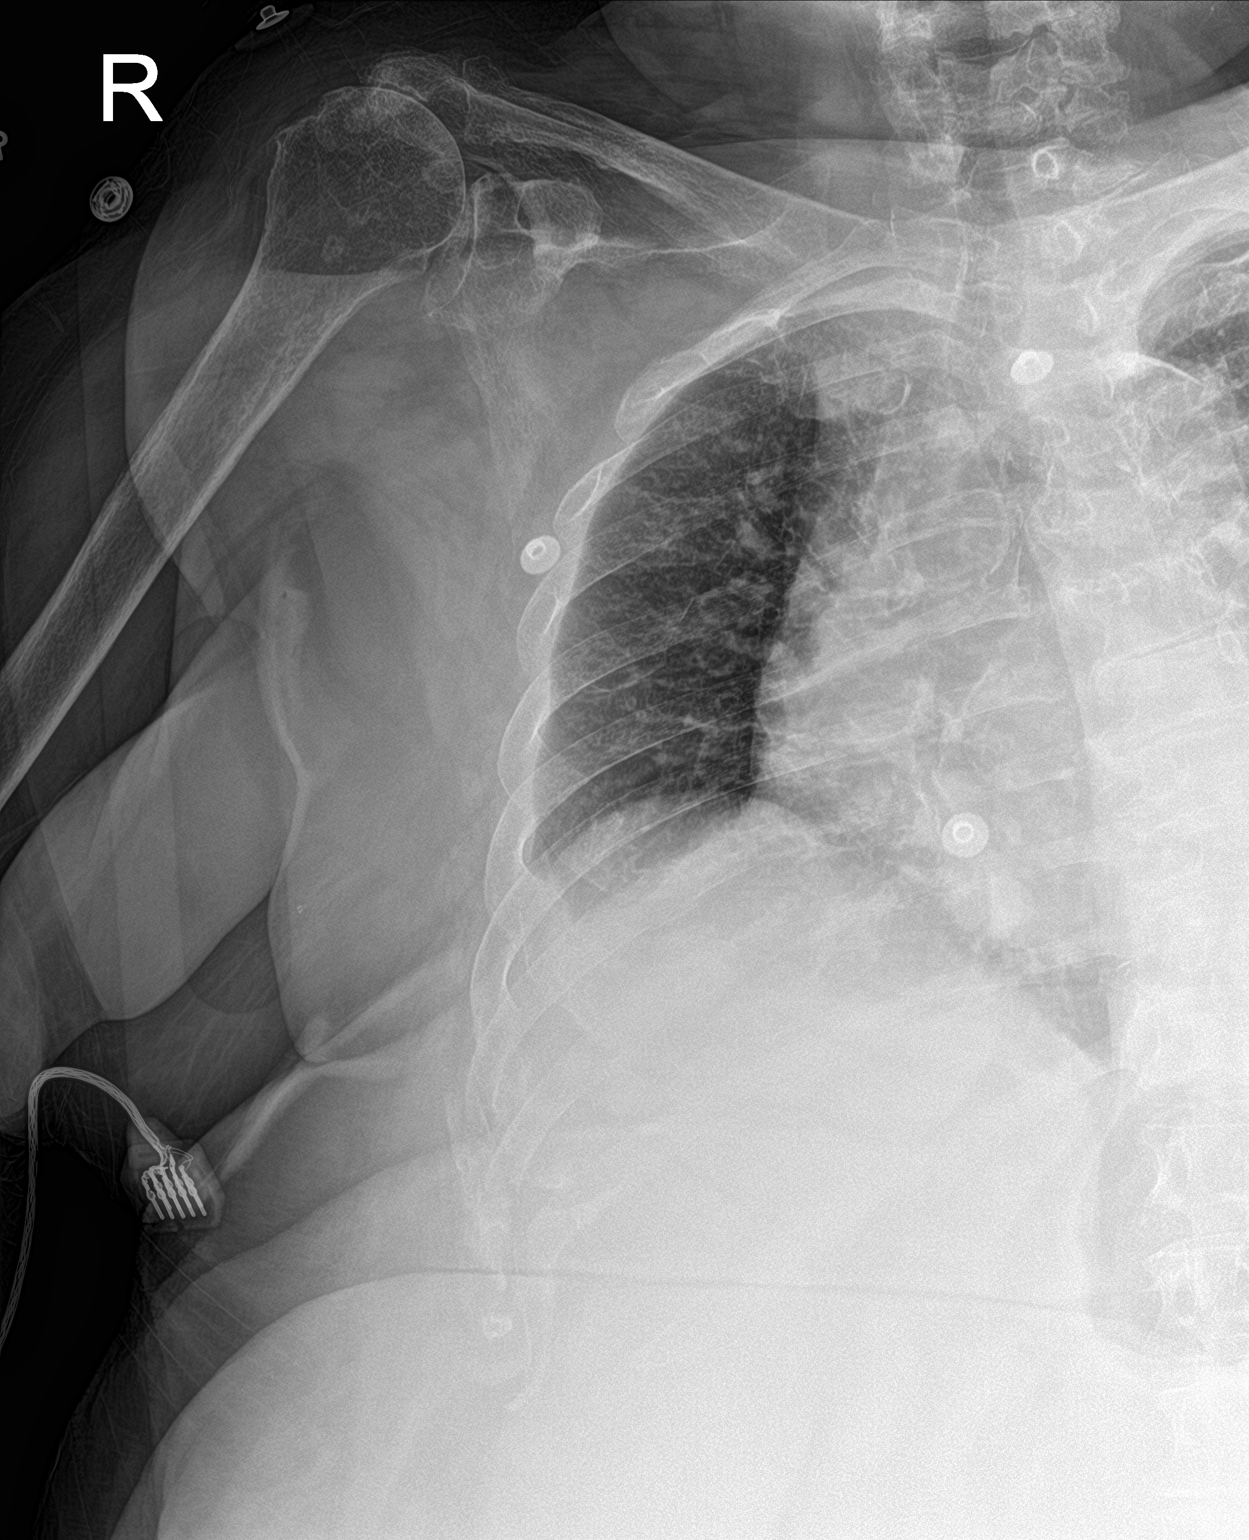

[rib ap obl]
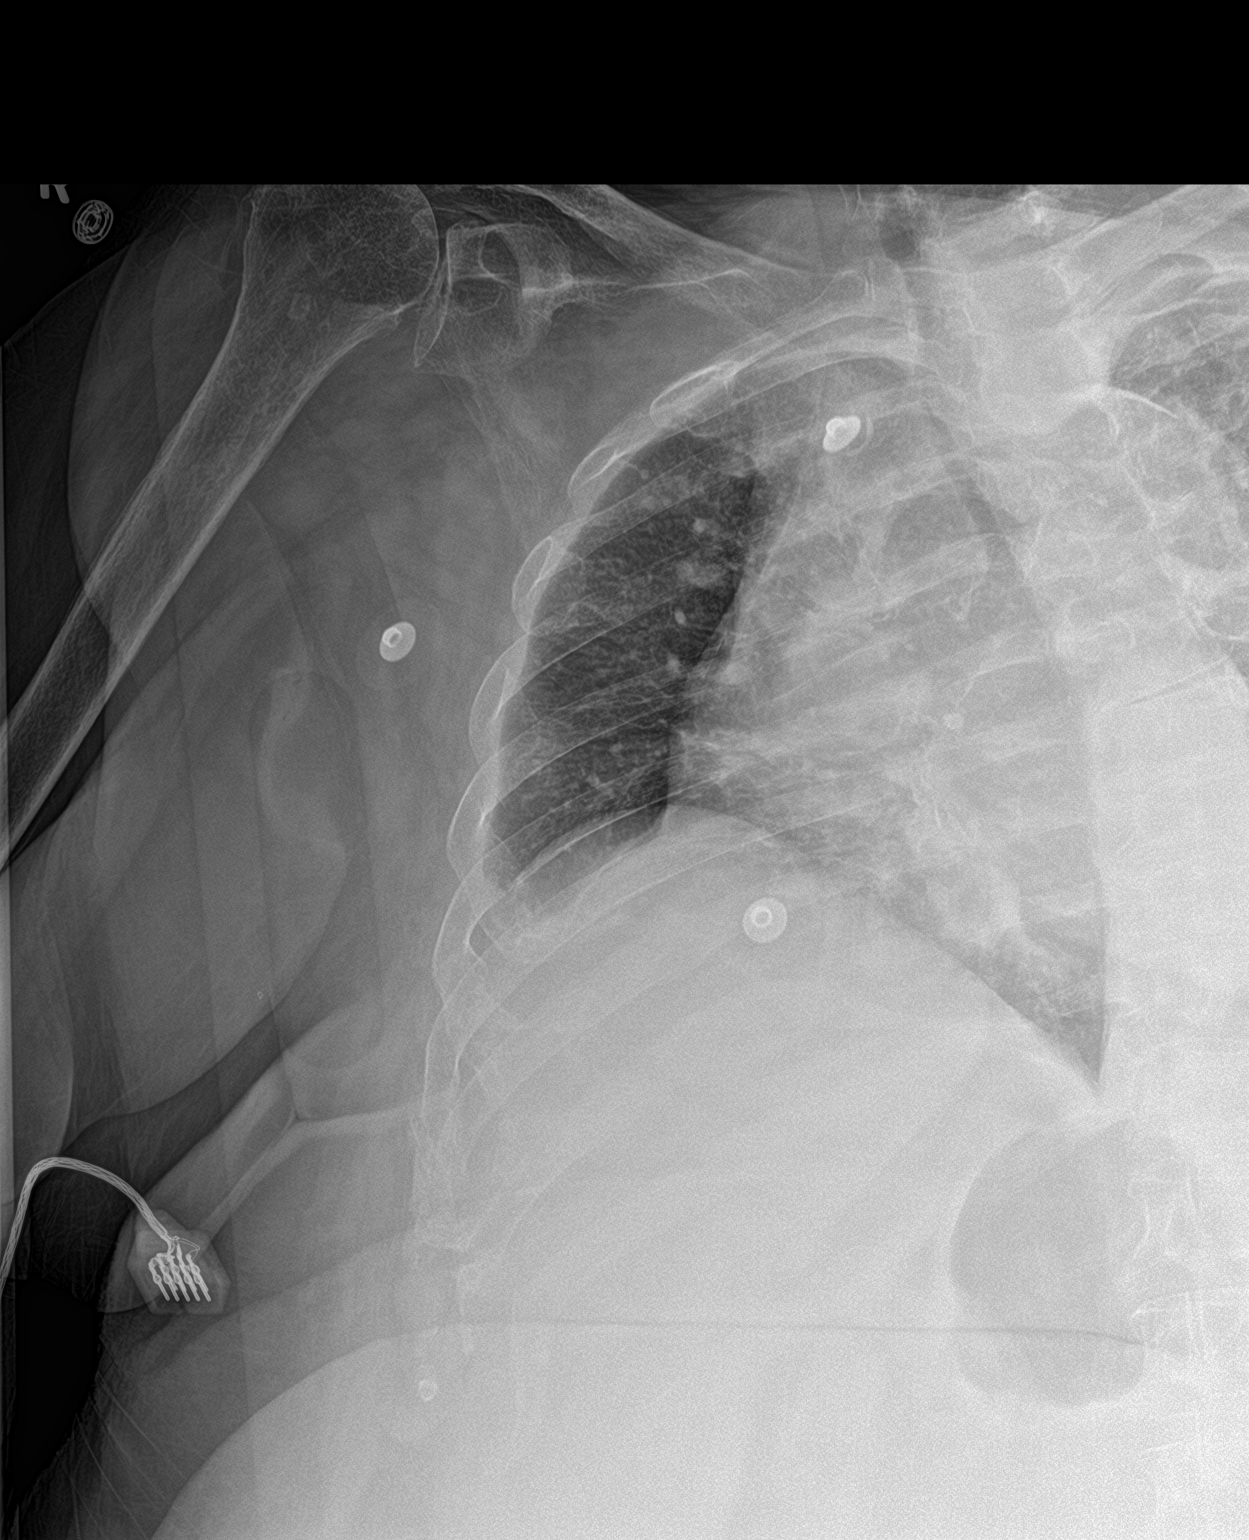

[2 of 2 positions shown; findings below may reference images not displayed]

FINDINGS: Two oblique views of the right ribs. No right pneumothorax, pleural
effusion or confluent pulmonary opacity. Displaced anterior right
8th rib fracture. More chronic appearing anterior right 6th rib
fracture. Osteopenia. Visible shoulder osseous structures appear
aligned. No other acute osseous abnormality identified.
IMPRESSION: 1. Acute, displaced anterior right 8th rib fracture. No right
pneumothorax.
2. More chronic appearing anterior right 6th rib fracture.

## 2021-02-04 MED ORDER — POTASSIUM CHLORIDE CRYS ER 20 MEQ PO TBCR
40.0000 meq | EXTENDED_RELEASE_TABLET | Freq: Once | ORAL | Status: AC
  Administered 2021-02-04: 40 meq via ORAL
  Filled 2021-02-04: qty 2

## 2021-02-04 MED ORDER — HEPARIN BOLUS VIA INFUSION
2000.0000 [IU] | Freq: Once | INTRAVENOUS | Status: AC
  Administered 2021-02-04: 2000 [IU] via INTRAVENOUS
  Filled 2021-02-04: qty 2000

## 2021-02-04 MED ORDER — SODIUM CHLORIDE 0.9 % IV BOLUS
1000.0000 mL | Freq: Once | INTRAVENOUS | Status: AC
  Administered 2021-02-04: 1000 mL via INTRAVENOUS

## 2021-02-04 MED ORDER — ADULT MULTIVITAMIN W/MINERALS CH
1.0000 | ORAL_TABLET | Freq: Every day | ORAL | Status: DC
  Administered 2021-02-04 – 2021-02-08 (×5): 1 via ORAL
  Filled 2021-02-04 (×5): qty 1

## 2021-02-04 MED ORDER — HYDROCODONE-ACETAMINOPHEN 5-325 MG PO TABS
1.0000 | ORAL_TABLET | ORAL | Status: DC | PRN
Start: 2021-02-04 — End: 2021-02-08
  Administered 2021-02-04 – 2021-02-08 (×10): 1 via ORAL
  Filled 2021-02-04 (×11): qty 1

## 2021-02-04 MED ORDER — FERROUS SULFATE 325 (65 FE) MG PO TABS
325.0000 mg | ORAL_TABLET | Freq: Every day | ORAL | Status: DC
  Administered 2021-02-04 – 2021-02-08 (×5): 325 mg via ORAL
  Filled 2021-02-04 (×5): qty 1

## 2021-02-04 MED ORDER — ENOXAPARIN SODIUM 100 MG/ML ~~LOC~~ SOLN
100.0000 mg | Freq: Two times a day (BID) | SUBCUTANEOUS | Status: DC
  Administered 2021-02-04 – 2021-02-06 (×4): 100 mg via SUBCUTANEOUS
  Filled 2021-02-04 (×4): qty 1

## 2021-02-04 MED ORDER — MAGNESIUM SULFATE 2 GM/50ML IV SOLN
2.0000 g | Freq: Once | INTRAVENOUS | Status: AC
  Administered 2021-02-04: 2 g via INTRAVENOUS
  Filled 2021-02-04: qty 50

## 2021-02-04 MED ORDER — DEXTROSE 50 % IV SOLN
50.0000 mL | Freq: Once | INTRAVENOUS | Status: AC
  Administered 2021-02-04: 50 mL via INTRAVENOUS
  Filled 2021-02-04: qty 50

## 2021-02-04 MED ORDER — HEPARIN (PORCINE) 25000 UT/250ML-% IV SOLN
1400.0000 [IU]/h | INTRAVENOUS | Status: DC
  Administered 2021-02-04: 1200 [IU]/h via INTRAVENOUS
  Filled 2021-02-04 (×2): qty 250

## 2021-02-04 MED ORDER — FAMOTIDINE 20 MG PO TABS
40.0000 mg | ORAL_TABLET | Freq: Two times a day (BID) | ORAL | Status: DC
  Administered 2021-02-04 – 2021-02-08 (×9): 40 mg via ORAL
  Filled 2021-02-04 (×9): qty 2

## 2021-02-04 MED ORDER — ATORVASTATIN CALCIUM 40 MG PO TABS: 40.0000 mg | ORAL_TABLET | Freq: Every day | ORAL | Status: DC

## 2021-02-04 MED ORDER — HEPARIN BOLUS VIA INFUSION
4000.0000 [IU] | Freq: Once | INTRAVENOUS | Status: AC
  Administered 2021-02-04: 4000 [IU] via INTRAVENOUS
  Filled 2021-02-04: qty 4000

## 2021-02-04 MED ORDER — ASPIRIN EC 81 MG PO TBEC: 81.0000 mg | DELAYED_RELEASE_TABLET | Freq: Every day | ORAL | Status: DC

## 2021-02-04 MED ORDER — DEXTROSE 50 % IV SOLN
12.5000 g | INTRAVENOUS | Status: AC
  Administered 2021-02-04: 12.5 g via INTRAVENOUS
  Filled 2021-02-04: qty 50

## 2021-02-04 NOTE — ED Notes (Signed)
Patient transported to X-ray 

## 2021-02-04 NOTE — Evaluation (Signed)
Physical Therapy Evaluation Patient Details Name: [name] [name] MRN: [id number] DOB: 19-Aug-1946 Today's Date: 02/04/2021   History of Present Illness  75 yo female with onset of an unwitnessed fall from her bed was brought to ED, suspected orthostatic BP but in AKI.  Pt was found to be hypothermic, to have R shoulder instability, was confused and was fractured at R 6 and 8 ribs.  BS was 35, unable to walk and in a-fib.  Now is referred to PT to recheck mobility as pt was mod I on RW with home care.  PMHx:  DM, HTN, HLD, anxiety, anemia, morbid obesity, OSA  Clinical Impression  Pt was seen for mobility on side of bed but cannot tolerate her R knee to move with the transition.  Her struggle to tolerate movement has created a limited ability to assist, and will currently be unable to get home at this level of ability.  Follow acutely for goals of PT as outlined below, and work toward a rehab stay to increase her independence and reuturn home at [facility].  Pt is fearful to move and interested to look into rehab stay.      Follow Up Recommendations SNF    Equipment Recommendations  None recommended by PT    Recommendations for Other Services       Precautions / Restrictions Precautions Precautions: Fall Precaution Comments: R knee pain with any movemeent Restrictions Weight Bearing Restrictions: No Other Position/Activity Restrictions: prefers sitting more upright      Mobility  Bed Mobility Overal bed mobility: Needs Assistance Bed Mobility: Rolling;Sidelying to Sit;Sit to Sidelying Rolling: Mod assist Sidelying to sit: Total assist     Sit to sidelying: Total assist General bed mobility comments: pt could not sit up fully due to R leg pain    Transfers                 General transfer comment: declined  Ambulation/Gait                Stairs            Wheelchair Mobility    Modified Rankin (Stroke Patients Only)       Balance Overall balance  assessment: Needs assistance Sitting-balance support: Feet supported;Bilateral upper extremity supported Sitting balance-Leahy Scale: Zero                                       Pertinent Vitals/Pain Pain Assessment: Faces Faces Pain Scale: Hurts whole lot Pain Location: R knee Pain Descriptors / Indicators: Grimacing;Guarding;Sharp Pain Intervention(s): Limited activity within patient's tolerance;Monitored during session;Premedicated before session;Repositioned    Home Living Family/patient expects to be discharged to:: Unsure                 Additional Comments: pt prefers to go to rehab but family not present    Prior Function Level of Independence: Independent with assistive device(s)         Comments: RW wth mod I     Hand Dominance   Dominant Hand: Right    Extremity/Trunk Assessment   Upper Extremity Assessment Upper Extremity Assessment: Overall WFL for tasks assessed    Lower Extremity Assessment Lower Extremity Assessment: Generalized weakness;RLE deficits/detail RLE Deficits / Details: pain to flex R knee RLE: Unable to fully assess due to pain RLE Coordination: decreased gross motor    Cervical / Trunk Assessment Cervical /  Trunk Assessment: Kyphotic  Communication   Communication: No difficulties  Cognition Arousal/Alertness: Awake/alert Behavior During Therapy: WFL for tasks assessed/performed Overall Cognitive Status: Within Functional Limits for tasks assessed                                 General Comments: pt is fearful to move but is able to nearly get to side of bed      General Comments General comments (skin integrity, edema, etc.): pt in pain on R knee and cannot tolerate getting up on side of bed.  Has poor tolerance to move yet reports she recently walked alone with RW    Exercises     Assessment/Plan    PT Assessment Patient needs continued PT services  PT Problem List Decreased  strength;Decreased range of motion;Decreased activity tolerance;Decreased balance;Decreased mobility;Decreased coordination;Decreased knowledge of use of DME;Decreased safety awareness;Cardiopulmonary status limiting activity;Pain;Decreased skin integrity       PT Treatment Interventions DME instruction;Gait training;Functional mobility training;Therapeutic activities;Therapeutic exercise;Balance training;Neuromuscular re-education;Patient/family education    PT Goals (Current goals can be found in the Care Plan section)  Acute Rehab PT Goals Patient Stated Goal:  (to get home safely) PT Goal Formulation: With patient Time For Goal Achievement: 02/18/21 Potential to Achieve Goals: Good    Frequency Min 3X/week   Barriers to discharge Inaccessible home environment;Decreased caregiver support      Co-evaluation               AM-PAC PT "6 Clicks" Mobility  Outcome Measure Help needed turning from your back to your side while in a flat bed without using bedrails?: A Lot Help needed moving from lying on your back to sitting on the side of a flat bed without using bedrails?: Total Help needed moving to and from a bed to a chair (including a wheelchair)?: Total Help needed standing up from a chair using your arms (e.g., wheelchair or bedside chair)?: Total Help needed to walk in hospital room?: Total Help needed climbing 3-5 steps with a railing? : Total 6 Click Score: 7    End of Session Equipment Utilized During Treatment: Oxygen Activity Tolerance: Patient limited by fatigue;Patient limited by pain Patient left: with call bell/phone within reach;in bed Nurse Communication: Mobility status PT Visit Diagnosis: Muscle weakness (generalized) (M62.81);Pain Pain - Right/Left: Right Pain - part of body: Knee    Time: 0867-6195 PT Time Calculation (min) (ACUTE ONLY): 25 min   Charges:   PT Evaluation $PT Eval Moderate Complexity: 1 Mod PT Treatments $Therapeutic Activity:  8-22 mins       Ramond Dial 02/04/2021, 8:07 PM [name], PT MS Acute Rehab Dept. Number: [place] and [place]

## 2021-02-04 NOTE — Progress Notes (Signed)
  Echocardiogram 2D Echocardiogram has been performed.  Bobbye Charleston 02/04/2021, 1:11 PM

## 2021-02-04 NOTE — Consult Note (Signed)
Reason for Consult:new onset A. Fib with controlled ventricular response Referring Physician:[facility] hospitalist  [name] [name] is an 75 y.o. female.  WCB:JSEGBTD is 75 year old female with past medical history is significant for moderate aortic stenosis, hypertension, insulin-requiring diabetes mellitus, hyperlipidemia, morbid obesity, osteoarthritis, history of carcinoma of the colon status post partial colectomy in the past, remote tobacco abuse,is admitted last night because of hypoglycemic episode with altered mental status noted to have blood sugar of 35, received 25 g of D10 with improvement in her mentation.  Cardiologic consultation was called as patient was noted to be in A. Fib with rapid ventricular response initially.  Patient denies any chest pain, nausea, vomiting, diaphoresis.  Denies any syncopal episode.  Denies palpitations, lightheadedness.  States did not eat day before yesterday enough and took 16 units of insulin and subsequently had hypoglycemic episode.  Patient last seen in my office approximately 3 years ago.  Patient presently fully alert, awake, denies any complaints.  Remains in A. Fib with controlled ventricular response.  Past Medical History:  Diagnosis Date  . Anemia   . Arthritis   . Cancer [facility]) 2012   colon  . Diabetes mellitus without complication ([place])   . GERD (gastroesophageal reflux disease)   . Headache   . Hypertension   . Sleep apnea    has cpap, does not use cpap on regular basis    Past Surgical History:  Procedure Laterality Date  . ABDOMINAL HYSTERECTOMY     partial  . BREAST BIOPSY Bilateral pt unsure   benign  . COLON SURGERY  2012   no chemo or radiation done  . COLONOSCOPY WITH PROPOFOL N/A 01/30/2015   Procedure: COLONOSCOPY WITH PROPOFOL;  Surgeon: Juanita Craver, MD;  Location: [facility];  Service: Endoscopy;  Laterality: N/A;  . JOINT REPLACEMENT     left hip replacement x 2  . left wrist surgery for fracture     . right hand  cortisone injection     cast put on    History reviewed. No pertinent family history.  Social History:  reports that she quit smoking about 56 years ago. Her smoking use included cigarettes. She has a 2.50 pack-year smoking history. She has never used smokeless tobacco. She reports that she does not drink alcohol and does not use drugs.  Allergies:  Allergies  Allergen Reactions  . Codeine     headache  . Oxycodone-Acetaminophen Nausea And Vomiting    Medications: I have reviewed the patient's current medications.  Results for orders placed or performed during the hospital encounter of 02/03/21 (from the past 48 hour(s))  CBG monitoring, ED     Status: Abnormal   Collection Time: 02/04/21 12:01 AM  Result Value Ref Range   Glucose-Capillary 67 (L) 70 - 99 mg/dL    Comment: Glucose reference range applies only to samples taken after fasting for at least 8 hours.  Comprehensive metabolic panel     Status: Abnormal   Collection Time: 02/04/21 12:31 AM  Result Value Ref Range   Sodium 137 135 - 145 mmol/L   Potassium 3.3 (L) 3.5 - 5.1 mmol/L   Chloride 105 98 - 111 mmol/L   CO2 21 (L) 22 - 32 mmol/L   Glucose, Bld 46 (L) 70 - 99 mg/dL    Comment: Glucose reference range applies only to samples taken after fasting for at least 8 hours.   BUN 25 (H) 8 - 23 mg/dL   Creatinine, Ser 1.11 (H) 0.44 - 1.00  mg/dL   Calcium 8.6 (L) 8.9 - 10.3 mg/dL   Total Protein 5.8 (L) 6.5 - 8.1 g/dL   Albumin 3.0 (L) 3.5 - 5.0 g/dL   AST 61 (H) 15 - 41 U/L   ALT 38 0 - 44 U/L   Alkaline Phosphatase 48 38 - 126 U/L   Total Bilirubin 0.8 0.3 - 1.2 mg/dL   GFR, Estimated 52 (L) >60 mL/min    Comment: (NOTE) Calculated using the CKD-EPI Creatinine Equation (2021)    Anion gap 11 5 - 15    Comment: Performed at [facility] [address] [place], [place] [address]  CBC with Differential     Status: Abnormal   Collection Time: 02/04/21 12:31 AM  Result Value Ref Range   WBC 6.8 4.0 - 10.5  K/uL   RBC 3.75 (L) 3.87 - 5.11 MIL/uL   Hemoglobin 11.5 (L) 12.0 - 15.0 g/dL   HCT 36.0 36.0 - 46.0 %   MCV 96.0 80.0 - 100.0 fL   MCH 30.7 26.0 - 34.0 pg   MCHC 31.9 30.0 - 36.0 g/dL   RDW 14.5 11.5 - 15.5 %   Platelets 207 150 - 400 K/uL   nRBC 0.0 0.0 - 0.2 %   Neutrophils Relative % 58 %   Neutro Abs 3.9 1.7 - 7.7 K/uL   Lymphocytes Relative 29 %   Lymphs Abs 2.0 0.7 - 4.0 K/uL   Monocytes Relative 11 %   Monocytes Absolute 0.8 0.1 - 1.0 K/uL   Eosinophils Relative 2 %   Eosinophils Absolute 0.2 0.0 - 0.5 K/uL   Basophils Relative 0 %   Basophils Absolute 0.0 0.0 - 0.1 K/uL   Immature Granulocytes 0 %   Abs Immature Granulocytes 0.01 0.00 - 0.07 K/uL    Comment: Performed at [facility] [address], [place], [place] [address]  Magnesium     Status: Abnormal   Collection Time: 02/04/21 12:31 AM  Result Value Ref Range   Magnesium 1.5 (L) 1.7 - 2.4 mg/dL    Comment: Performed at [facility] [address], [place], [place] [address]  CBG monitoring, ED     Status: Abnormal   Collection Time: 02/04/21 12:35 AM  Result Value Ref Range   Glucose-Capillary 101 (H) 70 - 99 mg/dL    Comment: Glucose reference range applies only to samples taken after fasting for at least 8 hours.  CBG monitoring, ED     Status: None   Collection Time: 02/04/21 12:53 AM  Result Value Ref Range   Glucose-Capillary 83 70 - 99 mg/dL    Comment: Glucose reference range applies only to samples taken after fasting for at least 8 hours.  CBG monitoring, ED     Status: None   Collection Time: 02/04/21  1:29 AM  Result Value Ref Range   Glucose-Capillary 71 70 - 99 mg/dL    Comment: Glucose reference range applies only to samples taken after fasting for at least 8 hours.  CBG monitoring, ED     Status: Abnormal   Collection Time: 02/04/21  1:49 AM  Result Value Ref Range   Glucose-Capillary 104 (H) 70 - 99 mg/dL    Comment: Glucose reference range applies only to samples taken  after fasting for at least 8 hours.  CBG monitoring, ED     Status: None   Collection Time: 02/04/21  2:29 AM  Result Value Ref Range   Glucose-Capillary 88 70 -  99 mg/dL    Comment: Glucose reference range applies only to samples taken after fasting for at least 8 hours.  SARS CORONAVIRUS 2 (TAT 6-24 HRS) Nasopharyngeal Nasopharyngeal Swab     Status: None   Collection Time: 02/04/21  3:37 AM   Specimen: Nasopharyngeal Swab  Result Value Ref Range   SARS Coronavirus 2 NEGATIVE NEGATIVE    Comment: (NOTE) SARS-CoV-2 target nucleic acids are NOT DETECTED.  The SARS-CoV-2 RNA is generally detectable in upper and lower respiratory specimens during the acute phase of infection. Negative results do not preclude SARS-CoV-2 infection, do not rule out co-infections with other pathogens, and should not be used as the sole basis for treatment or other patient management decisions. Negative results must be combined with clinical observations, patient history, and epidemiological information. The expected result is Negative.  Fact Sheet for Patients: SugarRoll.be  Fact Sheet for Healthcare Providers: https://www.woods-mathews.com/  This test is not yet approved or cleared by the [place] FDA and  has been authorized for detection and/or diagnosis of SARS-CoV-2 by FDA under an Emergency Use Authorization (EUA). This EUA will remain  in effect (meaning this test can be used) for the duration of the COVID-19 declaration under Se ction 564(b)(1) of the Act, 21 U.S.C. section 360bbb-3(b)(1), unless the authorization is terminated or revoked sooner.  Performed at [facility], [place] [address], [place], [place] [address]   CBG monitoring, ED     Status: Abnormal   Collection Time: 02/04/21  3:38 AM  Result Value Ref Range   Glucose-Capillary 118 (H) 70 - 99 mg/dL    Comment: Glucose reference range applies only to samples taken after fasting  for at least 8 hours.  TSH     Status: None   Collection Time: 02/04/21  4:47 AM  Result Value Ref Range   TSH 0.748 0.350 - 4.500 uIU/mL    Comment: Performed by a 3rd Generation assay with a functional sensitivity of <=0.01 uIU/mL. Performed at [facility], [place] [address], [place], [place] [address]   D-dimer, quantitative     Status: Abnormal   Collection Time: 02/04/21  4:47 AM  Result Value Ref Range   D-Dimer, Quant 0.53 (H) 0.00 - 0.50 ug/mL-FEU    Comment: (NOTE) At the manufacturer cut-off value of 0.5 g/mL FEU, this assay has a negative predictive value of 95-100%.This assay is intended for use in conjunction with a clinical pretest probability (PTP) assessment model to exclude pulmonary embolism (PE) and deep venous thrombosis (DVT) in outpatients suspected of PE or DVT. Results should be correlated with clinical presentation. Performed at [facility], [place] [address], [place], [place] [address]   Lactic acid, plasma     Status: None   Collection Time: 02/04/21  4:47 AM  Result Value Ref Range   Lactic Acid, Venous 1.4 0.5 - 1.9 mmol/L    Comment: Performed at [facility] [address], [place], [place] [address]  Procalcitonin - Baseline     Status: None   Collection Time: 02/04/21  4:47 AM  Result Value Ref Range   Procalcitonin <0.10 ng/mL    Comment:        Interpretation: PCT (Procalcitonin) <= 0.5 ng/mL: Systemic infection (sepsis) is not likely. Local bacterial infection is possible. (NOTE)       Sepsis PCT Algorithm           Lower Respiratory Tract  Infection PCT Algorithm    ----------------------------     ----------------------------         PCT < 0.25 ng/mL                PCT < 0.10 ng/mL          Strongly encourage             Strongly discourage   discontinuation of antibiotics    initiation of antibiotics    ----------------------------     -----------------------------       PCT 0.25 -  0.50 ng/mL            PCT 0.10 - 0.25 ng/mL               OR       >80% decrease in PCT            Discourage initiation of                                            antibiotics      Encourage discontinuation           of antibiotics    ----------------------------     -----------------------------         PCT >= 0.50 ng/mL              PCT 0.26 - 0.50 ng/mL               AND        <80% decrease in PCT             Encourage initiation of                                             antibiotics       Encourage continuation           of antibiotics    ----------------------------     -----------------------------        PCT >= 0.50 ng/mL                  PCT > 0.50 ng/mL               AND         increase in PCT                  Strongly encourage                                      initiation of antibiotics    Strongly encourage escalation           of antibiotics                                     -----------------------------                                           PCT <= 0.25 ng/mL  OR                                        > 80% decrease in PCT                                      Discontinue / Do not initiate                                             antibiotics  Performed at [facility], [place] [address], [place], [place] [address]   Cortisol-am, blood     Status: None   Collection Time: 02/04/21  4:47 AM  Result Value Ref Range   Cortisol - AM 9.4 6.7 - 22.6 ug/dL    Comment: Performed at [facility], [place] [address], [place], [place] [address]  Culture, blood (routine x 2)     Status: None (Preliminary result)   Collection Time: 02/04/21  5:00 AM   Specimen: BLOOD  Result Value Ref Range   Specimen Description BLOOD SITE NOT SPECIFIED    Special Requests      BOTTLES DRAWN AEROBIC AND ANAEROBIC Blood Culture adequate volume   Culture      NO GROWTH < 12 HOURS Performed at [facility], [place] [address], [place], [place] [address]    Report Status PENDING   CBG monitoring, ED     Status: None   Collection Time: 02/04/21  5:01 AM  Result Value Ref Range   Glucose-Capillary 85 70 - 99 mg/dL    Comment: Glucose reference range applies only to samples taken after fasting for at least 8 hours.  Culture, blood (routine x 2)     Status: None (Preliminary result)   Collection Time: 02/04/21  5:30 AM   Specimen: BLOOD  Result Value Ref Range   Specimen Description BLOOD SITE NOT SPECIFIED    Special Requests      BOTTLES DRAWN AEROBIC AND ANAEROBIC Blood Culture adequate volume   Culture      NO GROWTH < 12 HOURS Performed at [facility], [place] [address] [place], [place] [address]    Report Status PENDING   CBG monitoring, ED     Status: None   Collection Time: 02/04/21  6:27 AM  Result Value Ref Range   Glucose-Capillary 99 70 - 99 mg/dL    Comment: Glucose reference range applies only to samples taken after fasting for at least 8 hours.  CBG monitoring, ED     Status: None   Collection Time: 02/04/21  7:52 AM  Result Value Ref Range   Glucose-Capillary 84 70 - 99 mg/dL    Comment: Glucose reference range applies only to samples taken after fasting for at least 8 hours.  Heparin level (unfractionated)     Status: Abnormal   Collection Time: 02/04/21  7:55 AM  Result Value Ref Range   Heparin Unfractionated 0.10 (L) 0.30 - 0.70 IU/mL    Comment: (NOTE) If heparin results are below expected values, and patient dosage has  been confirmed, suggest follow up testing of antithrombin III levels. Performed at [facility], [place],  [place] [address]   Basic metabolic panel     Status: Abnormal   Collection Time: 02/04/21  7:55 AM  Result Value Ref Range   Sodium 135 135 - 145 mmol/L   Potassium 3.9 3.5 - 5.1 mmol/L   Chloride 105 98 - 111 mmol/L   CO2 20 (L) 22 - 32 mmol/L   Glucose, Bld 81 70 - 99 mg/dL    Comment: Glucose reference range applies  only to samples taken after fasting for at least 8 hours.   BUN 22 8 - 23 mg/dL   Creatinine, Ser 0.91 0.44 - 1.00 mg/dL   Calcium 8.1 (L) 8.9 - 10.3 mg/dL   GFR, Estimated >60 >60 mL/min    Comment: (NOTE) Calculated using the CKD-EPI Creatinine Equation (2021)    Anion gap 10 5 - 15    Comment: Performed at [facility] [address], [place], [place] [address]  Magnesium     Status: None   Collection Time: 02/04/21  7:55 AM  Result Value Ref Range   Magnesium 1.8 1.7 - 2.4 mg/dL    Comment: Performed at [facility] [address] [place], [place] [address]  CBG monitoring, ED     Status: Abnormal   Collection Time: 02/04/21 10:30 AM  Result Value Ref Range   Glucose-Capillary 102 (H) 70 - 99 mg/dL    Comment: Glucose reference range applies only to samples taken after fasting for at least 8 hours.  CBG monitoring, ED     Status: None   Collection Time: 02/04/21  1:04 PM  Result Value Ref Range   Glucose-Capillary 87 70 - 99 mg/dL    Comment: Glucose reference range applies only to samples taken after fasting for at least 8 hours.    DG Ribs Unilateral Right  Result Date: 02/04/2021 CLINICAL DATA:  75 year old female with rib contusion, pain. EXAM: RIGHT RIBS - 2 VIEW COMPARISON:  Portable chest.  0050 hours today and earlier FINDINGS: Two oblique views of the right ribs. No right pneumothorax, pleural effusion or confluent pulmonary opacity. Displaced anterior right 8th rib fracture. More chronic appearing anterior right 6th rib fracture. Osteopenia. Visible shoulder osseous structures appear aligned. No other acute osseous abnormality identified. IMPRESSION: 1. Acute, displaced anterior right 8th rib fracture. No right pneumothorax. 2. More chronic appearing anterior right 6th rib fracture. Electronically Signed   By: Genevie Ann M.D.   On: 02/04/2021 10:46   DG Shoulder Right  Result Date: 02/04/2021 CLINICAL DATA:  Rib contusion.  Shoulder pain. EXAM: RIGHT SHOULDER  - 2+ VIEW COMPARISON:  Chest and rib series 02/15/2008. FINDINGS: Diffuse osteopenia. Acromioclavicular and glenohumeral degenerative change. Posterior subluxation of the right shoulder cannot be excluded. No evidence of fracture. No evidence of separation. High-riding right shoulder consistent with chronic rotator cuff tear. IMPRESSION: 1. Diffuse osteopenia. Acromioclavicular and glenohumeral degenerative change. High-riding shoulder consistent with chronic rotator cuff tear. 2. Posterior subluxation of the right shoulder cannot be excluded. No evidence of fracture or separation. Electronically Signed   By: Marcello Moores  Register   On: 02/04/2021 10:47   CT HEAD WO CONTRAST  Result Date: 02/04/2021 CLINICAL DATA:  75 year old female status post loss of consciousness. Hypoglycemic (blood glucose of 35). Atrial fibrillation on anticoagulation. Recent fall. EXAM: CT HEAD WITHOUT CONTRAST TECHNIQUE: Contiguous axial images were obtained from the base of the skull through the vertex without intravenous contrast. COMPARISON:  None. FINDINGS: Brain: Cerebral volume is within normal limits for age. No midline  shift, ventriculomegaly, mass effect, evidence of mass lesion, intracranial hemorrhage or evidence of cortically based acute infarction. Gray-white matter differentiation is within normal limits throughout the brain. Vascular: Calcified atherosclerosis at the skull base. No suspicious intracranial vascular hyperdensity. Skull: Intact, negative. Sinuses/Orbits: Scattered mild paranasal sinus mucosal thickening. No sinus fluid levels. Tympanic cavities and mastoids are clear. Other: No orbit or scalp soft tissue injury identified. IMPRESSION: No acute traumatic injury identified. Normal for age non contrast CT appearance of the brain. Electronically Signed   By: Genevie Ann M.D.   On: 02/04/2021 06:20   DG Chest Port 1 View  Result Date: 02/04/2021 CLINICAL DATA:  Atrial fibrillation with altered level consciousness EXAM:  PORTABLE CHEST 1 VIEW COMPARISON:  12/17/2010 FINDINGS: Cardiac shadow is within normal limits. Tortuous thoracic aorta is noted. Lungs are well aerated bilaterally. No focal infiltrate or sizable effusion is seen. No bony abnormality is noted. IMPRESSION: No acute abnormality noted. Electronically Signed   By: Inez Catalina M.D.   On: 02/04/2021 00:56    Review of Systems  Constitutional: Negative for fever.  HENT: Negative for sore throat.   Eyes: Negative for discharge.  Respiratory: Negative for cough, chest tightness and shortness of breath.   Cardiovascular: Negative for chest pain, palpitations and leg swelling.  Gastrointestinal: Negative for abdominal distention, constipation and diarrhea.  Neurological: Negative for dizziness and syncope.  Hematological: Negative.    Blood pressure 110/69, pulse 92, temperature 98.8 F (37.1 C), temperature source Oral, resp. rate 17, height 5\' 2"  (1.575 m), weight 104.3 kg, SpO2 96 %. Physical Exam Constitutional:      Appearance: Normal appearance.  HENT:     Head: Atraumatic.  Eyes:     Extraocular Movements: Extraocular movements intact.  Neck:     Vascular: No carotid bruit.  Cardiovascular:     Comments: Irregularly irregular S1 and S2 soft.  There is 2/6 systolic murmur noted.  No S3 gallop Pulmonary:     Effort: Pulmonary effort is normal.     Breath sounds: Normal breath sounds. No wheezing, rhonchi or rales.  Abdominal:     General: Bowel sounds are normal. There is distension.     Tenderness: There is no abdominal tenderness. There is no guarding.  Musculoskeletal:     Cervical back: Normal range of motion and neck supple.  Skin:    General: Skin is warm and dry.     Findings: Bruising (left knee noted) present.  Neurological:     General: No focal deficit present.     Mental Status: She is alert and oriented to person, place, and time.     Assessment/Plan: A. Fib with controlled ventricular response chads vasc  score  of 5 Status post hypoglycemic episode with altered mental status secondary to meds. Moderate aortic stenosis.  In the past. Hypertension. Insulin-requiring diabetes mellitus. Hyperlipidemia. Morbid obesity. History of CA of the colon in the past. History of remote tobacco abuse. Degenerative joint disease. History of remote tobacco abuse Chronic anemia Plan Agree with present management. Check 2-D echo. Discussed with patient regarding synchronized TE assisted cardioversion versus rate control and chronic anticoagulation for now and then possible chemical cardioversion as an outpatient 4-6 weeks, and agrees for chemical cardioversion.  We will add low-dose beta blocker as blood pressure and heart rate tolerates. Switch heparin to Eliquis   [name] [name], Prudencio Burly 02/04/2021, 1:13 PM

## 2021-02-04 NOTE — Progress Notes (Signed)
PT Cancellation Note  Patient Details Name: [name] [name] MRN: [id number] DOB: [birth year]   Cancelled Treatment:    Reason Eval/Treat Not Completed: Patient at procedure or test/unavailable.  Pt was having [place] and will retry as time and pt allow.   Ramond Dial 02/04/2021, 2:00 PM  [name], PT MS Acute Rehab Dept. Number: [place] and [place]

## 2021-02-04 NOTE — Progress Notes (Signed)
ANTICOAGULATION CONSULT NOTE - Initial Consult  Pharmacy Consult for heparin Indication: atrial fibrillation  Allergies  Allergen Reactions  . Codeine     headache  . Oxycodone-Acetaminophen Nausea And Vomiting    Patient Measurements: Height: 5\' 2"  (157.5 cm) Weight: 104.3 kg (230 lb) (estimate) IBW/kg (Calculated) : 50.1 Heparin Dosing Weight: 75.1 kg  Vital Signs: Temp: 98 F (36.7 C) (03/09 0830) Temp Source: Rectal (03/09 0830) BP: 118/59 (03/09 0830) Pulse Rate: 84 (03/09 0830)  Labs: Recent Labs    02/04/21 0031 02/04/21 0755  HGB 11.5*  --   HCT 36.0  --   PLT 207  --   HEPARINUNFRC  --  0.10*  CREATININE 1.11*  --     Estimated Creatinine Clearance: 48.9 mL/min (A) (by C-G formula based on SCr of 1.11 mg/dL (H)).   Medical History: Past Medical History:  Diagnosis Date  . Anemia   . Arthritis   . Cancer [facility]) 2012   colon  . Diabetes mellitus without complication ([place])   . GERD (gastroesophageal reflux disease)   . Headache   . Hypertension   . Sleep apnea    has cpap, does not use cpap on regular basis    Medications:  See medication history  Assessment: 75 yo lady to start heparin for new onset afib. She was not on anticoagulation PTA. Admission Hg 11.5, PTLC 207. Pt also reports a recent fall but head CT did not show a bleed.  3/9 @0755 : HL 0.10 on 1200 units/hr. No infusion issues per RN.  Goal of Therapy:  Heparin level 0.3-0.7 units/ml Monitor platelets by anticoagulation protocol: Yes   Plan:  Heparin bolus 2000 units and increase drip to 1400 units/hr Check heparin level in 8 hours Daily HL and CBC while on heparin Monitor for bleeding complications Follow up long term anticoagulation plan per cards  Jacobo Forest PharmD Candidate 2022 02/04/2021 9:10 AM

## 2021-02-04 NOTE — ED Notes (Signed)
Pt reports at this time she is unable to get up to perform orthostatic vitals

## 2021-02-04 NOTE — Plan of Care (Signed)
  Problem: Education: Goal: Knowledge of General Education information will improve Description: Including pain rating scale, medication(s)/side effects and non-pharmacologic comfort measures Outcome: Progressing   Problem: Clinical Measurements: Goal: Ability to maintain clinical measurements within normal limits will improve Outcome: Progressing Goal: Will remain free from infection Outcome: Progressing   Problem: Nutrition: Goal: Adequate nutrition will be maintained Outcome: Progressing

## 2021-02-04 NOTE — Progress Notes (Signed)
ANTICOAGULATION CONSULT NOTE - Initial Consult  Pharmacy Consult for heparin Indication: atrial fibrillation  Allergies  Allergen Reactions  . Codeine     headache  . Oxycodone-Acetaminophen Nausea And Vomiting    Patient Measurements: Height: 5\' 2"  (157.5 cm) Weight: 104.3 kg (230 lb) (estimate) IBW/kg (Calculated) : 50.1 Heparin Dosing Weight: 75.1 kg  Vital Signs: Temp: 94.6 F (34.8 C) (03/09 0011) Temp Source: Rectal (03/09 0011) BP: 94/54 (03/09 0045) Pulse Rate: 84 (03/09 0045)  Labs: Recent Labs    02/04/21 0031  HGB 11.5*  HCT 36.0  PLT 207    CrCl cannot be calculated (Patient's most recent lab result is older than the maximum 21 days allowed.).   Medical History: Past Medical History:  Diagnosis Date  . Anemia   . Arthritis   . Cancer [facility]) 2012   colon  . Diabetes mellitus without complication ([place])   . GERD (gastroesophageal reflux disease)   . Headache   . Hypertension   . Sleep apnea    has cpap, does not use cpap on regular basis    Medications:  See medication history  Assessment: 75 yo lady to start heparin for afib.  She was not on anticoagulation PTA.  Hg 11.5, PTLC 207 Goal of Therapy:  Heparin level 0.3-0.7 units/ml Monitor platelets by anticoagulation protocol: Yes   Plan:  Heparin bolus 4000 units and drip at 1200 units/hr Check heparin level in 6-8 hours Daily HL and CBC Monitor for bleeding complications  Excell Seltzer P[place]et 02/04/2021,1:03 AM

## 2021-02-04 NOTE — ED Notes (Signed)
Pt is eating lunch  °

## 2021-02-04 NOTE — Progress Notes (Signed)
[facility] PLAN OF CARE NOTE Patient: [name] [name] JSR:[id number]   PCP: Chesley Noon, MD DOB: May 01, 1946   DOA: 02/03/2021   DOS: 02/04/2021    Patient was admitted by my colleague earlier on 02/04/2021. I have reviewed the H&P as well as assessment and plan and agree with the same. Important changes in the plan are listed below.  Plan of care: Principal Problem:   Hypoglycemia Active Problems:   Hypothermia   Hypotension   Atrial fibrillation, new onset (HCC)   AKI (acute kidney injury) ([place])   Atrial fibrillation with rapid ventricular response ([place]) Patient currently on IV heparin.  Cardiology consulted for anticoagulation.  Appears to have significant murmur on examination.  Echocardiogram currently pending to identify valvular A. Fib. Patient appears to have history of recurrent fall therefore unsure regarding her candidacy for long-term anticoagulation. PT consult currently pending as well. Patient reported right-sided shoulder pain as well as rib cage pain. X-ray positive for acute displaced right eighth rib fracture without any pneumothorax and chronic appearing right sixth rib fracture. X-ray shoulder negative for any fracture but unable to rule out posterior subluxation.  If the pain does not improve will get CT.  There is evidence of chronic rotator cuff tear. For pain control patient is on chronic Vicodin at home which I will resume. Changing level of care from progressive care unit to telemetry unit. Check orthostatic vitals.  Author: Berle Mull, MD [facility] 02/04/2021 5:43 PM   If 7PM-7AM, please contact night-coverage at [email or web address]

## 2021-02-04 NOTE — Progress Notes (Signed)
ANTICOAGULATION CONSULT NOTE - Initial Consult  Pharmacy Consult for heparin>lovenox Indication: atrial fibrillation  Allergies  Allergen Reactions  . Codeine     headache  . Oxycodone-Acetaminophen Nausea And Vomiting    Patient Measurements: Height: 5\' 2"  (157.5 cm) Weight: 98.4 kg (216 lb 14.9 oz) IBW/kg (Calculated) : 50.1 Heparin Dosing Weight: 75.1 kg  Vital Signs: Temp: 98 F (36.7 C) (03/09 1526) Temp Source: Oral (03/09 1030) BP: 117/49 (03/09 1526) Pulse Rate: 77 (03/09 1526)  Labs: Recent Labs    02/04/21 0031 02/04/21 0755 02/04/21 1658  HGB 11.5*  --   --   HCT 36.0  --   --   PLT 207  --   --   HEPARINUNFRC  --  0.10* 0.23*  CREATININE 1.11* 0.91  --     Estimated Creatinine Clearance: 57.6 mL/min (by C-G formula based on SCr of 0.91 mg/dL).   Medical History: Past Medical History:  Diagnosis Date  . Anemia   . Arthritis   . Cancer [facility]) 2012   colon  . Diabetes mellitus without complication ([place])   . GERD (gastroesophageal reflux disease)   . Headache   . Hypertension   . Sleep apnea    has cpap, does not use cpap on regular basis    Medications:  See medication history  Assessment: 75 yo lady to start heparin for new onset afib. She was not on anticoagulation PTA. Admission Hg 11.5, PTLC 207. Pt also reports a recent fall but head CT did not show a bleed.  Heparin level came back subtherapeutic again this PM. Plan for possible apixaban. Does have a hx of falls. D/w Dr. Posey Pronto and we will change heparin to Lovenox for now.   Scr 0.91 Cbc wnl  Goal of Therapy:  Anti-Xa 0.6-1 Monitor platelets by anticoagulation protocol: Yes   Plan:  Dc heparin Lovenox 100mg  SQ BID F/u long term [facility] plan  Onnie Boer, PharmD, BCIDP, AAHIVP, CPP Infectious Disease Pharmacist 02/04/2021 6:12 PM

## 2021-02-04 NOTE — H&P (Signed)
History and Physical    [name] [name] IOM:[id number] DOB: 05-29-1946 DOA: 02/03/2021  PCP: Chesley Noon, MD Patient coming from: Home  Chief Complaint: Hypoglycemia  HPI: [name] [name] is a 75 y.o. female with medical history significant of insulin-dependent type 2 diabetes, hypertension, hyperlipidemia, anxiety, anemia, morbid obesity (BMI 42.07), GERD, sleep apnea presenting to the ED via EMS after she had loss of consciousness at home and was found to be hypoglycemic by EMS with blood glucose of 35.  She was given an amp of dextrose and glucose improved to 200.  She was also found to be in A. fib with rate initially 140-150.  History provided by patient and her sister at bedside.  Sister states patient did not lose consciousness at home.  She was confused and just staring and when EMS checked her blood sugar they found it was low.  Patient states she takes Novolin 70/30 16 units in the morning and 20 units in the evening.  She took her evening insulin around 6 PM in the evening but does report poor appetite during the day.  States she ate only 2 crackers for breakfast, one half sandwich for lunch, and 1 eggroll for dinner.  Denies fevers, cough, shortness of breath, chest pain, nausea, vomiting, abdominal pain, diarrhea, or dysuria.  She is not vaccinated against COVID.  States she had a fall a few days ago but does not exactly remember what happened.  She thinks she had a bad dream and when she woke up she was on the floor next to her bed.  Denies any headaches, neck pain, back pain, or pain anywhere else.  States her left knee was a little bruised but she was able to get up and has been ambulating since then without any difficulty.  She uses a walker at baseline.  States she has chronic knee problems but has not had any pain since the day she fell.  Patient denies history of A. fib.  States her doctor told her she has a heart murmur.  ED Course: Hypothermic with temperature 94.6 F.   Slightly tachycardic on arrival.  Hypotensive with systolic in the 84T to 36I.  Not tachypneic or hypoxemic.  Labs showing WBC 6.8, hemoglobin 11.5 (at baseline), platelet count 207K.  Sodium 137, potassium 3.3, chloride 105, bicarb 21, anion gap 11, BUN 25, creatinine 1.1 (baseline 0.7), glucose 46.  Magnesium 1.5.  SARS-CoV-2 PCR test pending.  Chest x-ray showing no acute abnormality. Patient was given an amp of D50, oral potassium 40 mEq, IV magnesium 2 g, and started on IV heparin.  Review of Systems:  All systems reviewed and apart from history of presenting illness, are negative.  Past Medical History:  Diagnosis Date  . Anemia   . Arthritis   . Cancer [facility]) 2012   colon  . Diabetes mellitus without complication ([place])   . GERD (gastroesophageal reflux disease)   . Headache   . Hypertension   . Sleep apnea    has cpap, does not use cpap on regular basis    Past Surgical History:  Procedure Laterality Date  . ABDOMINAL HYSTERECTOMY     partial  . BREAST BIOPSY Bilateral pt unsure   benign  . COLON SURGERY  2012   no chemo or radiation done  . COLONOSCOPY WITH PROPOFOL N/A 01/30/2015   Procedure: COLONOSCOPY WITH PROPOFOL;  Surgeon: Juanita Craver, MD;  Location: [facility];  Service: Endoscopy;  Laterality: N/A;  . JOINT REPLACEMENT  left hip replacement x 2  . left wrist surgery for fracture     . right hand cortisone injection     cast put on     reports that she quit smoking about 56 years ago. Her smoking use included cigarettes. She has a 2.50 pack-year smoking history. She has never used smokeless tobacco. She reports that she does not drink alcohol and does not use drugs.  Allergies  Allergen Reactions  . Codeine     headache  . Oxycodone-Acetaminophen Nausea And Vomiting    History reviewed. No pertinent family history.  Prior to Admission medications   Medication Sig Start Date End Date Taking? Authorizing Provider  acetaminophen (TYLENOL) 650 MG CR  tablet Take 1,300 mg by mouth every 8 (eight) hours as needed for pain.   Yes [provider]  ALPRAZolam (XANAX) 0.25 MG tablet Take 0.25 mg by mouth 2 (two) times daily as needed for anxiety.   Yes [provider]  aspirin EC 81 MG tablet Take 81 mg by mouth daily.   Yes [provider]  doxylamine, Sleep, (UNISOM) 25 MG tablet Take 50 mg by mouth at bedtime.   Yes [provider]  famotidine (PEPCID) 40 MG tablet Take 40 mg by mouth 2 (two) times daily. 11/16/14  Yes [provider]  ferrous sulfate 325 (65 FE) MG tablet Take 325 mg by mouth 3 (three) times daily with meals.   Yes [provider]  hydrochlorothiazide (HYDRODIURIL) 25 MG tablet Take 25 mg by mouth every morning.  11/13/14  Yes [provider]  HYDROcodone-acetaminophen (NORCO/VICODIN) 5-325 MG tablet Take 1 tablet by mouth every 6 (six) hours as needed. 10/30/20  Yes Drenda Freeze, MD  insulin NPH-regular Human (NOVOLIN 70/30) (70-30) 100 UNIT/ML injection Inject 16-20 Units into the skin 2 (two) times daily with a meal. 16 units in the morning and 20 units in the evening   Yes [provider]  losartan (COZAAR) 100 MG tablet Take 100 mg by mouth at bedtime.  11/13/14  Yes [provider]  metFORMIN (GLUCOPHAGE) 1000 MG tablet Take 1,000 mg by mouth 2 (two) times daily with a meal. 11/13/14  Yes [provider]  Multiple Vitamins-Minerals (ONE-A-DAY WOMENS 50+) TABS Take 1 tablet by mouth daily.   Yes [provider]  simvastatin (ZOCOR) 80 MG tablet Take 80 mg by mouth at bedtime. 11/13/14  Yes [provider]    Physical Exam: Vitals:   02/04/21 0315 02/04/21 0330 02/04/21 0342 02/04/21 0400  BP: (!) 87/55 (!) 96/58  (!) 93/50  Pulse: 77 76  65  Resp: 16 15  16   Temp:   99 F (37.2 C)   TempSrc:   Rectal   SpO2: 96% 95%  96%  Weight:      Height:        Physical Exam Constitutional:      General: She is  not in acute distress. HENT:     Head: Normocephalic and atraumatic.  Eyes:     Extraocular Movements: Extraocular movements intact.     Conjunctiva/sclera: Conjunctivae normal.  Cardiovascular:     Rate and Rhythm: Normal rate. Rhythm irregular.     Pulses: Normal pulses.  Pulmonary:     Effort: Pulmonary effort is normal. No respiratory distress.     Breath sounds: Normal breath sounds. No wheezing or rales.  Abdominal:     General: Bowel sounds are normal. There is no distension.  Palpations: Abdomen is soft.     Tenderness: There is no abdominal tenderness.  Musculoskeletal:        General: No swelling or tenderness.     Cervical back: Normal range of motion and neck supple.     Comments: Bruise noted on left knee  Skin:    General: Skin is warm and dry.  Neurological:     General: No focal deficit present.     Mental Status: She is alert and oriented to person, place, and time.     Labs on Admission: I have personally reviewed following labs and imaging studies  CBC: Recent Labs  Lab 02/04/21 0031  WBC 6.8  NEUTROABS 3.9  HGB 11.5*  HCT 36.0  MCV 96.0  PLT 952   Basic Metabolic Panel: Recent Labs  Lab 02/04/21 0031  NA 137  K 3.3*  CL 105  CO2 21*  GLUCOSE 46*  BUN 25*  CREATININE 1.11*  CALCIUM 8.6*  MG 1.5*   GFR: Estimated Creatinine Clearance: 48.9 mL/min (A) (by C-G formula based on SCr of 1.11 mg/dL (H)). Liver Function Tests: Recent Labs  Lab 02/04/21 0031  AST 61*  ALT 38  ALKPHOS 48  BILITOT 0.8  PROT 5.8*  ALBUMIN 3.0*   No results for input(s): LIPASE, AMYLASE in the last 168 hours. No results for input(s): AMMONIA in the last 168 hours. Coagulation Profile: No results for input(s): INR, PROTIME in the last 168 hours. Cardiac Enzymes: No results for input(s): CKTOTAL, CKMB, CKMBINDEX, TROPONINI in the last 168 hours. BNP (last 3 results) No results for input(s): PROBNP in the last 8760 hours. HbA1C: No results for  input(s): HGBA1C in the last 72 hours. CBG: Recent Labs  Lab 02/04/21 0053 02/04/21 0129 02/04/21 0149 02/04/21 0229 02/04/21 0338  GLUCAP 83 71 104* 88 118*   Lipid Profile: No results for input(s): CHOL, HDL, LDLCALC, TRIG, CHOLHDL, LDLDIRECT in the last 72 hours. Thyroid Function Tests: No results for input(s): TSH, T4TOTAL, FREET4, T3FREE, THYROIDAB in the last 72 hours. Anemia Panel: No results for input(s): VITAMINB12, FOLATE, FERRITIN, TIBC, IRON, RETICCTPCT in the last 72 hours. Urine analysis:    Component Value Date/Time   COLORURINE YELLOW 07/29/2018 0407   APPEARANCEUR CLEAR 07/29/2018 0407   LABSPEC 1.017 07/29/2018 0407   PHURINE 5.0 07/29/2018 0407   GLUCOSEU NEGATIVE 07/29/2018 0407   HGBUR SMALL (A) 07/29/2018 0407   BILIRUBINUR NEGATIVE 07/29/2018 0407   KETONESUR 20 (A) 07/29/2018 0407   PROTEINUR NEGATIVE 07/29/2018 0407   UROBILINOGEN 0.2 01/07/2011 1002   NITRITE NEGATIVE 07/29/2018 0407   LEUKOCYTESUR NEGATIVE 07/29/2018 0407    Radiological Exams on Admission: DG Chest Port 1 View  Result Date: 02/04/2021 CLINICAL DATA:  Atrial fibrillation with altered level consciousness EXAM: PORTABLE CHEST 1 VIEW COMPARISON:  12/17/2010 FINDINGS: Cardiac shadow is within normal limits. Tortuous thoracic aorta is noted. Lungs are well aerated bilaterally. No focal infiltrate or sizable effusion is seen. No bony abnormality is noted. IMPRESSION: No acute abnormality noted. Electronically Signed   By: Inez Catalina M.D.   On: 02/04/2021 00:56    EKG: Independently reviewed.  A. fib, rate controlled.  Assessment/Plan Principal Problem:   Hypoglycemia Active Problems:   Hypothermia   Hypotension   Atrial fibrillation, new onset (HCC)   AKI (acute kidney injury) ([place])   Hypoglycemia in the setting of insulin-dependent type 2 diabetes Her home insulin regimen includes Novolin 70/30 16 units in the morning and 20 units in the evening.  She was hypoglycemic with  CBG in the 30s with EMS.  Blood glucose initially improved to 200 after she was given an amp of dextrose by EMS.  However, upon arrival to the ED she was hypoglycemic again with blood glucose in the 40s.  She was given another amp of dextrose and glucose subsequently improved.  Most recent CBG 118.  She is awake and alert.  Suspect hypoglycemia is due to poor oral intake in setting of insulin use.  Patient states yesterday she had only 2 crackers for breakfast, one half sandwich for lunch, and 1 eggroll for dinner. -Hold insulin.  CBG checks every 2 hours.  Encourage p.o. intake, regular diet.  Start dextrose infusion if she continues to be hypoglycemic.  Hypothermia Hypotension Temperature 94.6 F and was placed on external warmer.  Temperature now improved to 99 F.  Hypothermia likely precipitated by severe hypoglycemia.  Although she is hypotensive, does not have leukocytosis to suggest underlying infection.  Chest x-ray not suggestive of pneumonia.  Not endorsing UTI symptoms. Hypotension likely due to dehydration.  Systolic now around 671-245. -Continue to monitor temperature closely.  Will give IV fluid bolus and monitor blood pressure closely.  Check lactic acid and procalcitonin levels.  Order UA.  Blood culture x2 ordered.  SARS-CoV-2 PCR test pending.  Check cortisol level and TSH levels.  New onset A. Fib Found to be in A. fib with rate 140-150 initially with EMS.  In the ED, she is rate controlled without receiving any AV nodal blocking agent or IV fluids.  No documented history of A. Fib.  ?Infection as precipitating factor in the setting of hypothermia and hypotension.  PE less likely given no hypoxemia or signs of respiratory distress.  CHA2DS2VASc 5 and was started on IV heparin for anticoagulation in the ED. -Cardiac monitoring.  Continue IV heparin.  Echocardiogram ordered.  Check TSH and D-dimer levels.  Cardiology has been consulted (sent message to cardiology secretary).  Mild  AKI Likely prerenal from hypotension/dehydration. BUN 25, creatinine 1.1 (baseline 0.7). -IV fluid hydration.  Monitor renal function and urine output.  Avoid nephrotoxic agents-hold home hydrochlorothiazide and losartan.   Recent fall -Patient started on anticoagulation in the ED for A. fib.  She does report a recent fall, will order head CT to rule out intracranial bleed.  She has a bruise on her left knee and on exam not able to demonstrate any range of motion of both of her knees.  However, she denies any pain in her knees and tells me she has been able to ambulate at home with a walker without any difficulty.  She uses a walker at baseline.   Order PT/OT eval and follow fall precautions.  Obtain imaging of knees if she is having difficulty ambulating with PT.  She denies any injuries related to her recent fall.  Denies neck or back pain.  Mild hypokalemia  Hypomagnesemia -Monitor potassium and magnesium levels, replenish as needed.  Hypertension -Hold antihypertensives at this time.  Hyperlipidemia -Check CK level given recent fall.  If CK is normal, continue home statin.  GERD -Continue Pepcid  Chronic anemia Hemoglobin at baseline. -Continue home iron supplement  DVT prophylaxis: Heparin Code Status: Patient wishes to be DNR. Family Communication: Sister at bedside. Disposition Plan: Status is: Inpatient  Remains inpatient appropriate because:Inpatient level of care appropriate due to severity of illness   Dispo: The patient is from: Home  Anticipated d/c is to: SNF              Patient currently is not medically stable to d/c.   Difficult to place patient No  Level of care: Level of care: Progressive   The medical decision making on this patient was of high complexity and the patient is at high risk for clinical deterioration, therefore this is a level 3 visit.  Shela Leff MD [facility]  If 7PM-7AM, please contact  night-coverage [email or web address]  02/04/2021, 4:50 AM

## 2021-02-04 NOTE — ED Provider Notes (Signed)
[facility] EMERGENCY DEPARTMENT Provider Note   CSN: [id number] Arrival date & time: 02/03/21  2357  History Chief Complaint  Patient presents with  . Hypoglycemia    [name] [name] is a 75 y.o. female.  The history is provided by the patient and the EMS personnel.  Hypoglycemia She has history of hypertension, diabetes, hyperlipidemia, GERD was brought in by ambulance because of hypoglycemia.  Initial glucose was noted to be 35 and she was given an amp of dextrose with glucose going up over 200.  On arrival to the ED, glucose has come back down to 65.  Patient states that he is taking all of her medications today and had been eating normally.  She did not miss any meals.  She denies fever chills denies nausea or vomiting.  EMS noted she was in atrial fibrillation.  Patient states that she does have a history of this but I do not see it documented.  She is not on any anticoagulation.  Past Medical History:  Diagnosis Date  . Anemia   . Arthritis   . Cancer [facility]) 2012   colon  . Diabetes mellitus without complication ([place])   . GERD (gastroesophageal reflux disease)   . Headache   . Hypertension   . Sleep apnea    has cpap, does not use cpap on regular basis    Patient Active Problem List   Diagnosis Date Noted  . HYPERLIPIDEMIA-MIXED 12/18/2010  . HYPERTENSION, BENIGN 12/18/2010  . SHORTNESS OF BREATH 12/18/2010  . OBSTRUCTIVE SLEEP APNEA 01/11/2008    Past Surgical History:  Procedure Laterality Date  . ABDOMINAL HYSTERECTOMY     partial  . BREAST BIOPSY Bilateral pt unsure   benign  . COLON SURGERY  2012   no chemo or radiation done  . COLONOSCOPY WITH PROPOFOL N/A 01/30/2015   Procedure: COLONOSCOPY WITH PROPOFOL;  Surgeon: Juanita Craver, MD;  Location: [facility];  Service: Endoscopy;  Laterality: N/A;  . JOINT REPLACEMENT     left hip replacement x 2  . left wrist surgery for fracture     . right hand cortisone injection     cast put on      OB History   No obstetric history on file.     History reviewed. No pertinent family history.  Social History   Tobacco Use  . Smoking status: Former Smoker    Packs/day: 0.25    Years: 10.00    Pack years: 2.50    Types: Cigarettes    Quit date: 11/29/1964    Years since quitting: 56.2  . Smokeless tobacco: Never Used  Substance Use Topics  . Alcohol use: No  . Drug use: No    Home Medications Prior to Admission medications   Medication Sig Start Date End Date Taking? Authorizing Provider  ALPRAZolam (XANAX) 0.25 MG tablet Take 0.25 mg by mouth 2 (two) times daily as needed for anxiety.    [provider]  aspirin EC 81 MG tablet Take 81 mg by mouth daily.    [provider]  famotidine (PEPCID) 40 MG tablet Take 40 mg by mouth 2 (two) times daily. 11/16/14   [provider]  ferrous sulfate 325 (65 FE) MG tablet Take 325 mg by mouth 3 (three) times daily with meals.    [provider]  hydrochlorothiazide (HYDRODIURIL) 25 MG tablet Take 25 mg by mouth every morning.  11/13/14   [provider]  HYDROcodone-acetaminophen (NORCO/VICODIN) 5-325 MG tablet Take  1 tablet by mouth every 6 (six) hours as needed. 10/30/20   Drenda Freeze, MD  insulin NPH-regular Human (NOVOLIN 70/30) (70-30) 100 UNIT/ML injection Inject 24-40 Units into the skin 2 (two) times daily with a meal. 40 units in the morning and 24 units in the evening    [provider]  losartan (COZAAR) 100 MG tablet Take 100 mg by mouth at bedtime.  11/13/14   [provider]  metFORMIN (GLUCOPHAGE) 1000 MG tablet Take 1,000 mg by mouth 2 (two) times daily with a meal. 11/13/14   [provider]  simvastatin (ZOCOR) 80 MG tablet Take 80 mg by mouth at bedtime. 11/13/14   [provider]    Allergies    Codeine and Oxycodone-acetaminophen  Review of Systems   Review of Systems  All other systems reviewed and are  negative.   Physical Exam Updated Vital Signs BP 111/74 (BP Location: Right Arm)   Pulse 95   Temp (!) 94.6 F (34.8 C) (Rectal)   Resp 19   SpO2 96%   Physical Exam Vitals and nursing note reviewed.   75 year old female, resting comfortably and in no acute distress. Vital signs are significant for low temperature. Oxygen saturation is 96%, which is normal. Head is normocephalic and atraumatic. PERRLA, EOMI. Oropharynx is clear. Neck is nontender and supple without adenopathy or JVD. Back is nontender and there is no CVA tenderness. Lungs are clear without rales, wheezes, or rhonchi. Chest is nontender. Heart has an irregular rhythm without murmur. Abdomen is soft, flat, nontender without masses or hepatosplenomegaly and peristalsis is normoactive. Extremities have no cyanosis or edema, full range of motion is present. Skin is warm and dry without rash. Neurologic: Mental status is normal, cranial nerves are intact, there are no motor or sensory deficits.  ED Results / Procedures / Treatments   Labs (all labs ordered are listed, but only abnormal results are displayed) Labs Reviewed  CBG MONITORING, ED - Abnormal; Notable for the following components:      Result Value   Glucose-Capillary 67 (*)    All other components within normal limits    EKG  EKG Interpretation  Date/Time:  Wednesday February 04 2021 00:08:49 EST Ventricular Rate:  86 PR Interval:    QRS Duration: 109 QT Interval:  400 QTC Calculation: 479 R Axis:   19 Text Interpretation: Atrial fibrillation Abnormal R-wave progression, early transition When compared with ECG of 12/17/2010, Atrial fibrillation has replaced Sinus rhythm Confirmed by Delora Fuel ([id number]) on 02/04/2021 12:43:08 AM        Radiology DG Chest Port 1 View  Result Date: 02/04/2021 CLINICAL DATA:  Atrial fibrillation with altered level consciousness EXAM: PORTABLE CHEST 1 VIEW COMPARISON:  12/17/2010 FINDINGS: Cardiac shadow is within  normal limits. Tortuous thoracic aorta is noted. Lungs are well aerated bilaterally. No focal infiltrate or sizable effusion is seen. No bony abnormality is noted. IMPRESSION: No acute abnormality noted. Electronically Signed   By: Inez Catalina M.D.   On: 02/04/2021 00:56    Procedures Procedures  CRITICAL CARE Performed by: Delora Fuel Total critical care time: 60 minutes Critical care time was exclusive of separately billable procedures and treating other patients. Critical care was necessary to treat or prevent imminent or life-threatening deterioration. Critical care was time spent personally by me on the following activities: development of treatment plan with patient and/or surrogate as well as nursing, discussions with consultants, evaluation of patient's response to treatment, examination of patient,  obtaining history from patient or surrogate, ordering and performing treatments and interventions, ordering and review of laboratory studies, ordering and review of radiographic studies, pulse oximetry and re-evaluation of patient's condition.  Medications Ordered in ED Medications  heparin ADULT infusion 100 units/mL (25000 units/219mL) (1,200 Units/hr Intravenous New Bag/Given 02/04/21 0128)  dextrose 50 % solution 50 mL (50 mLs Intravenous Given 02/04/21 0012)  heparin bolus via infusion 4,000 Units (4,000 Units Intravenous Bolus from Bag 02/04/21 0128)  potassium chloride SA (KLOR-CON) CR tablet 40 mEq (40 mEq Oral Given 02/04/21 0239)  magnesium sulfate IVPB 2 g 50 mL (0 g Intravenous Stopped 02/04/21 0346)    ED Course  I have reviewed the triage vital signs and the nursing notes.  Pertinent labs & imaging results that were available during my care of the patient were reviewed by me and considered in my medical decision making (see chart for details).  MDM Rules/Calculators/A&P Hypoglycemia of uncertain cause.  Hypoglycemia recurred after IV dextrose and she will be given additional  dextrose as well as food and will need to monitor glucose carefully.  New onset atrial fibrillation.  In spite of patient's claim of history of atrial fibrillation, I can find no documentation of prior atrial fibrillation and no ECGs showing atrial fibrillation.  Because she was not aware of her rhythm change, she is not a candidate for immediate cardioversion.  Because of hypothermia, she is started placed on external warmer although I suspect that her hypothermia was probably related to her hypoglycemia and should improve with maintaining adequate glucose level.  On review of old records, I see no prior ED visits for hypoglycemia.  Glucose has not dropped below 100.  Labs show hypokalemia and hypomagnesemia.  She is given oral potassium and intravenous magnesium.  Also, mild acute kidney injury present and mild elevation of AST.  Mild anemia is present unchanged from baseline.  Hypothermia has resolved with correction of glucose and external warming.  I have gone back to talk with the patient and she thinks that perhaps she was mistaken about her history of atrial fibrillation and actually she just had a history of a heart murmur.  Chest x-ray shows no acute process.  Case is discussed with Dr. Marlowe Sax of [facility], who agrees to admit the patient.  CHA2DS2/VAS Stroke Risk Points      5 >= 2 Points: High Risk  1 - 1.99 Points: Medium Risk  0 Points: Low Risk    Last Change: N/A      This score determines the patient's risk of having a stroke if the  patient has atrial fibrillation.     This patient's score is 5 (2 points for age, one point for female sex, 1 point for history of hypertension, 1 point for history of diabetes).    Final Clinical Impression(s) / ED Diagnoses Final diagnoses:  Hypoglycemia  Hypothermia, initial encounter  Atrial fibrillation, unspecified type ([place])  Acute kidney injury (nontraumatic) ([facility])  Hypokalemia  Hypomagnesemia  Normochromic normocytic anemia     Rx / DC Orders ED Discharge Orders    None       Delora Fuel, MD 58/30/94 [phone]

## 2021-02-05 LAB — BASIC METABOLIC PANEL
Anion gap: 7 (ref 5–15)
BUN: 16 mg/dL (ref 8–23)
CO2: 23 mmol/L (ref 22–32)
Calcium: 8.6 mg/dL — ABNORMAL LOW (ref 8.9–10.3)
Chloride: 103 mmol/L (ref 98–111)
Creatinine, Ser: 0.94 mg/dL (ref 0.44–1.00)
GFR, Estimated: >60 mL/min (ref >60)
Glucose, Bld: 274 mg/dL — ABNORMAL HIGH (ref 70–99)
Potassium: 4.2 mmol/L (ref 3.5–5.1)
Sodium: 133 mmol/L — ABNORMAL LOW (ref 135–145)

## 2021-02-05 LAB — CBC
HCT: 30.4 % — ABNORMAL LOW (ref 36.0–46.0)
HCT: 32.4 % — ABNORMAL LOW (ref 36.0–46.0)
Hemoglobin: 9.8 g/dL — ABNORMAL LOW (ref 12.0–15.0)
Hemoglobin: 10.8 g/dL — ABNORMAL LOW (ref 12.0–15.0)
MCH: 30.2 pg (ref 26.0–34.0)
MCH: 30.8 pg (ref 26.0–34.0)
MCHC: 32.2 g/dL (ref 30.0–36.0)
MCHC: 33.3 g/dL (ref 30.0–36.0)
MCV: 93.8 fL (ref 80.0–100.0)
MCV: 92.3 fL (ref 80.0–100.0)
Platelets: 215 10*3/uL (ref 150–400)
Platelets: 237 10*3/uL (ref 150–400)
RBC: 3.24 MIL/uL — ABNORMAL LOW (ref 3.87–5.11)
RBC: 3.51 MIL/uL — ABNORMAL LOW (ref 3.87–5.11)
RDW: 14.3 % (ref 11.5–15.5)
RDW: 14.4 % (ref 11.5–15.5)
WBC: 6.6 10*3/uL (ref 4.0–10.5)
WBC: 6.8 10*3/uL (ref 4.0–10.5)
nRBC: 0 % (ref 0.0–0.2)
nRBC: 0 % (ref 0.0–0.2)

## 2021-02-05 LAB — GLUCOSE, CAPILLARY
Glucose-Capillary: 98 mg/dL (ref 70–99)
Glucose-Capillary: 81 mg/dL (ref 70–99)
Glucose-Capillary: 95 mg/dL (ref 70–99)
Glucose-Capillary: 102 mg/dL — ABNORMAL HIGH (ref 70–99)
Glucose-Capillary: 287 mg/dL — ABNORMAL HIGH (ref 70–99)
Glucose-Capillary: 197 mg/dL — ABNORMAL HIGH (ref 70–99)
Glucose-Capillary: 177 mg/dL — ABNORMAL HIGH (ref 70–99)
Glucose-Capillary: 273 mg/dL — ABNORMAL HIGH (ref 70–99)
Glucose-Capillary: 176 mg/dL — ABNORMAL HIGH (ref 70–99)
Glucose-Capillary: 143 mg/dL — ABNORMAL HIGH (ref 70–99)

## 2021-02-05 LAB — MAGNESIUM: Magnesium: 1.5 mg/dL — ABNORMAL LOW (ref 1.7–2.4)

## 2021-02-05 MED ORDER — ONDANSETRON HCL 4 MG/2ML IJ SOLN: 4.0000 mg | Freq: Four times a day (QID) | INTRAMUSCULAR | Status: DC | PRN

## 2021-02-05 MED ORDER — ACETAMINOPHEN 325 MG PO TABS
650.0000 mg | ORAL_TABLET | Freq: Four times a day (QID) | ORAL | Status: DC | PRN
  Administered 2021-02-06 – 2021-02-08 (×2): 650 mg via ORAL
  Filled 2021-02-05 (×2): qty 2

## 2021-02-05 MED ORDER — SENNOSIDES-DOCUSATE SODIUM 8.6-50 MG PO TABS
1.0000 | ORAL_TABLET | Freq: Two times a day (BID) | ORAL | Status: DC
  Administered 2021-02-05 – 2021-02-08 (×6): 1 via ORAL
  Filled 2021-02-05 (×6): qty 1

## 2021-02-05 MED ORDER — MAGNESIUM SULFATE 2 GM/50ML IV SOLN
2.0000 g | Freq: Once | INTRAVENOUS | Status: AC
  Administered 2021-02-05: 2 g via INTRAVENOUS
  Filled 2021-02-05: qty 50

## 2021-02-05 NOTE — Progress Notes (Signed)
Subjective:  Denies any anginal chest pain or shortness of breath.  Complains of right rib pain and left knee pain.  Remains in A. fib with controlled ventricular response.  Noted to have significant drop in hemoglobin from 11.5-9.8 today.  Chest x-ray showed right eighth rib displaced fracture and old sixth rib fracture.  Objective:  Vital Signs in the last 24 hours: Temp:  [97.8 F (36.6 C)-98.8 F (37.1 C)] 98.3 F (36.8 C) (03/10 0501) Pulse Rate:  [77-157] 82 (03/10 0501) Resp:  [13-23] 16 (03/10 0501) BP: (93-126)/(49-80) 109/77 (03/10 0501) SpO2:  [93 %-97 %] 94 % (03/10 0501) Weight:  [98.4 kg] 98.4 kg (03/09 1523)  Intake/Output from previous day: 03/09 0701 - 03/10 0700 In: 736.7 [P.O.:480; I.V.:256.7] Out: 600 [Urine:600] Intake/Output from this shift: No intake/output data recorded.  Physical Exam: Neck: no adenopathy, no carotid bruit, no JVD and supple, symmetrical, trachea midline Lungs: clear to auscultation bilaterally Heart: irregularly irregular rhythm, S1, S2 normal and 2/6 systolic murmur noted Abdomen: soft, non-tender; bowel sounds normal; no masses,  no organomegaly Extremities: No clubbing cyanosis or edema left knee abrasions  Lab Results: Recent Labs    02/04/21 0031 02/05/21 0321  WBC 6.8 6.6  HGB 11.5* 9.8*  PLT 207 215   Recent Labs    02/04/21 0031 02/04/21 0755  NA 137 135  K 3.3* 3.9  CL 105 105  CO2 21* 20*  GLUCOSE 46* 81  BUN 25* 22  CREATININE 1.11* 0.91   No results for input(s): TROPONINI in the last 72 hours.  Invalid input(s): CK, MB Hepatic Function Panel Recent Labs    02/04/21 0031  PROT 5.8*  ALBUMIN 3.0*  AST 61*  ALT 38  ALKPHOS 48  BILITOT 0.8   No results for input(s): CHOL in the last 72 hours. No results for input(s): PROTIME in the last 72 hours.  Imaging: Imaging results have been reviewed and DG Ribs Unilateral Right  Result Date: 02/04/2021 CLINICAL DATA:  75 year old female with rib contusion,  pain. EXAM: RIGHT RIBS - 2 VIEW COMPARISON:  Portable chest.  0050 hours today and earlier FINDINGS: Two oblique views of the right ribs. No right pneumothorax, pleural effusion or confluent pulmonary opacity. Displaced anterior right 8th rib fracture. More chronic appearing anterior right 6th rib fracture. Osteopenia. Visible shoulder osseous structures appear aligned. No other acute osseous abnormality identified. IMPRESSION: 1. Acute, displaced anterior right 8th rib fracture. No right pneumothorax. 2. More chronic appearing anterior right 6th rib fracture. Electronically Signed   By: Genevie Ann M.D.   On: 02/04/2021 10:46   DG Shoulder Right  Result Date: 02/04/2021 CLINICAL DATA:  Rib contusion.  Shoulder pain. EXAM: RIGHT SHOULDER - 2+ VIEW COMPARISON:  Chest and rib series 02/15/2008. FINDINGS: Diffuse osteopenia. Acromioclavicular and glenohumeral degenerative change. Posterior subluxation of the right shoulder cannot be excluded. No evidence of fracture. No evidence of separation. High-riding right shoulder consistent with chronic rotator cuff tear. IMPRESSION: 1. Diffuse osteopenia. Acromioclavicular and glenohumeral degenerative change. High-riding shoulder consistent with chronic rotator cuff tear. 2. Posterior subluxation of the right shoulder cannot be excluded. No evidence of fracture or separation. Electronically Signed   By: Marcello Moores  Register   On: 02/04/2021 10:47   CT HEAD WO CONTRAST  Result Date: 02/04/2021 CLINICAL DATA:  75 year old female status post loss of consciousness. Hypoglycemic (blood glucose of 35). Atrial fibrillation on anticoagulation. Recent fall. EXAM: CT HEAD WITHOUT CONTRAST TECHNIQUE: Contiguous axial images were obtained from the base of  the skull through the vertex without intravenous contrast. COMPARISON:  None. FINDINGS: Brain: Cerebral volume is within normal limits for age. No midline shift, ventriculomegaly, mass effect, evidence of mass lesion, intracranial  hemorrhage or evidence of cortically based acute infarction. Gray-white matter differentiation is within normal limits throughout the brain. Vascular: Calcified atherosclerosis at the skull base. No suspicious intracranial vascular hyperdensity. Skull: Intact, negative. Sinuses/Orbits: Scattered mild paranasal sinus mucosal thickening. No sinus fluid levels. Tympanic cavities and mastoids are clear. Other: No orbit or scalp soft tissue injury identified. IMPRESSION: No acute traumatic injury identified. Normal for age non contrast CT appearance of the brain. Electronically Signed   By: Genevie Ann M.D.   On: 02/04/2021 06:20   DG Chest Port 1 View  Result Date: 02/04/2021 CLINICAL DATA:  Atrial fibrillation with altered level consciousness EXAM: PORTABLE CHEST 1 VIEW COMPARISON:  12/17/2010 FINDINGS: Cardiac shadow is within normal limits. Tortuous thoracic aorta is noted. Lungs are well aerated bilaterally. No focal infiltrate or sizable effusion is seen. No bony abnormality is noted. IMPRESSION: No acute abnormality noted. Electronically Signed   By: Inez Catalina M.D.   On: 02/04/2021 00:56    Cardiac Studies:  Assessment/Plan:  A. Fib with controlled ventricular response chads vasc  score of 5 Status post hypoglycemic episode with altered mental status secondary to meds. Moderate aortic stenosis.  In the past. Hypertension. Insulin-requiring diabetes mellitus. Hyperlipidemia. Morbid obesity. History of CA of the colon in the past. History of remote tobacco abuse. Degenerative joint disease. History of remote tobacco abuse Acute on chronic chronic anemia probably secondary to hydration rule out GI loss versus chest wall hematoma History of recurrent fall and acute fractures of the right 8 rib History of fall and sixth rib fracture in the past Plan Agree with holding anticoagulation for now in view of recurrent fall and acute fracture and dropping hemoglobin although patient at high risk for  embolic CVA in view of atrial fibrillation and multiple risk factors.  LOS: 1 day    Charolette Forward 02/05/2021, 9:37 AM

## 2021-02-05 NOTE — Progress Notes (Signed)
Inpatient Diabetes Program Recommendations  AACE/ADA: New Consensus Statement on Inpatient Glycemic Control (2015)  Target Ranges:  Prepandial:   less than 140 mg/dL      Peak postprandial:   less than 180 mg/dL (1-2 hours)      Critically ill patients:  140 - 180 mg/dL   Lab Results  Component Value Date   GLUCAP 197 (H) 02/05/2021   HGBA1C (H) 01/11/2011    8.4 (NOTE)                                                                       According to the ADA Clinical Practice Recommendations for 2011, when HbA1c is used as a screening test:   >=6.5%   Diagnostic of Diabetes Mellitus           (if abnormal result  is confirmed)  5.7-6.4%   Increased risk of developing Diabetes Mellitus  References:Diagnosis and Classification of Diabetes Mellitus,Diabetes FXOV,2919,16(OMAYO 1):S62-S69 and Standards of Medical Care in         Diabetes - 2011,Diabetes Care,2011,34  (Suppl 1):S11-S61.    Review of Glycemic Control  Diabetes history: DM2 Outpatient Diabetes medications: Novolin 70/30 16 units in am and 20 units QPM, metformin 1000 mg BID Current orders for Inpatient glycemic control: None  CBGs today: 95, 102, 287, 197 mg/dL AAte 100% at lunch  Inpatient Diabetes Program Recommendations:     Add Lantus 10 units QHS Add Novolog 0-9 units TID with meals Add Novolog 3 units TID with meals if eating > 50% meal  Will speak with pt about hypos at home and reducing 70/30 dose, including f/u with PCP. Last appt was in 10/21.   Continue to follow.  Thank you. Lorenda Peck, RD, LDN, CDE Inpatient Diabetes Coordinator [phone]

## 2021-02-05 NOTE — Plan of Care (Signed)
  Problem: Education: Goal: Knowledge of General Education information will improve Description: Including pain rating scale, medication(s)/side effects and non-pharmacologic comfort measures Outcome: Progressing   Problem: Coping: Goal: Level of anxiety will decrease Outcome: Progressing   Problem: Pain Managment: Goal: General experience of comfort will improve Outcome: Progressing   

## 2021-02-05 NOTE — TOC Initial Note (Signed)
Transition of Care [facility]) - Initial/Assessment Note    Patient Details  Name: [name] [name] MRN: [id number] Date of Birth: 11-04-1946  Transition of [facility] CM/SW Contact:    [name] Feil, LCSW Phone Number: 02/05/2021, 4:21 PM  Clinical Narrative:   CSW talked with patient and her sister [name] [name] at the bedside regarding her discharge disposition and the recommendation of ST rehab. [name] [name] was sitting in a chair at bedside and was alert, oriented and agreeable to talking with CSW.  When asked, [name] [name] responded that she lives 2 miles from her sister.  CSW explained that CIR reported that patient would probably not be approved by her insurance for CIR based on her diagnosis and [name] [name] and her sister expressed understanding and had no questions regarding inpatient rehab.   CSW explained the facility search process and ST rehab in a facility and provided patient/sister with a Medicare.gov SNF list and their questions were addressed.  Patient was asked about using a CPAP machine and she explained that she has one at home but does not use it due to having to get out of bed frequently to use the bathroom.              Expected Discharge Plan: Skilled Nursing Facility Barriers to Discharge: Continued Medical Work up   Patient Goals and CMS Choice Patient states their goals for this hospitalization and ongoing recovery are:: Patient agreeable to short-term rehab once ready for discharge CMS Medicare.gov Compare Post Acute Care list provided to:: Patient (Patient's sister [name] [name] was also in the room and reviewed list) Choice offered to / list presented to : Patient  Expected Discharge Plan and Services Expected Discharge Plan: [place] In-house Referral: Clinical Social Work     Living arrangements for the past 2 months: Single Family Home                                     Prior Living Arrangements/Services Living arrangements for  the past 2 months: Single Family Home Lives with:: Self Patient language and need for interpreter reviewed:: No Do you feel safe going back to the place where you live?: Yes (Patient feels safe at home, however she is agreeable to [facility] rehab once medically stable.)      Need for Family Participation in Patient Care: Yes (Comment) Care giver support system in place?: Yes (comment) (Per sister, family checks in on patient) Current home services: Other (comment) (None at this time) Criminal Activity/Legal Involvement Pertinent to Current Situation/Hospitalization: No - Comment as needed  Activities of Daily Living      Permission Sought/Granted   Permission granted to share information with : Yes, Verbal Permission Granted  Share Information with NAME: [name] [name]     Permission granted to share info w Relationship: Sister  Permission granted to share info w Contact Information: [phone]  Emotional Assessment Appearance:: Appears stated age Attitude/Demeanor/Rapport: Engaged Affect (typically observed): Pleasant Orientation: : Oriented to Self,Oriented to Place,Oriented to  Time,Oriented to Situation Alcohol / Substance Use: Tobacco Use,Alcohol Use,Illicit Drugs (Per H&P, patient quit smoking and does not drink or use illicit drugs) Psych Involvement: No (comment)  Admission diagnosis:  Hypokalemia [E87.6] Hypomagnesemia [E83.42] Hypoglycemia [E16.2] Shoulder pain [M25.519] Rib contusion [S20.219A] Acute kidney injury (nontraumatic) ([place]) [N17.9] Atrial fibrillation with rapid ventricular response ([place]) [I48.91] Normochromic normocytic anemia [D64.9] Hypothermia, initial encounter [T68.XXXA] Atrial fibrillation, unspecified  type [facility]) [I48.91] Patient Active Problem List   Diagnosis Date Noted  . Hypoglycemia 02/04/2021  . Hypothermia 02/04/2021  . Hypotension 02/04/2021  . Atrial fibrillation, new onset ([place]) 02/04/2021  . AKI (acute kidney injury) ([place]) 02/04/2021  .  Atrial fibrillation with rapid ventricular response ([place]) 02/04/2021  . HYPERLIPIDEMIA-MIXED 12/18/2010  . HYPERTENSION, BENIGN 12/18/2010  . SHORTNESS OF BREATH 12/18/2010  . OBSTRUCTIVE SLEEP APNEA 01/11/2008   PCP:  Chesley Noon, MD Pharmacy:   [place], [place] - [address] [place] [place] [address] Phone: [phone] Fax: [phone]    Social Determinants of Health (SDOH) Interventions  No SDOH interventions requested or needed at this time  Readmission Risk Interventions No flowsheet data found.

## 2021-02-05 NOTE — Progress Notes (Signed)
[facility] Progress Note  Patient: [name] [name]    JWJ:[id number]  DOA: 02/03/2021     Date of Service: the patient was seen and examined on 02/05/2021  Brief hospital course: Past medical history of IDDM, HTN, HLD, anxiety, anemia, morbid obesity, recurrent fall, sleep apnea not on CPAP, GERD. Currently plan is arrange for safe discharge.  Assessment and Plan: Hypoglycemia in the setting of insulin-dependent type 2 diabetes Her home insulin regimen includes Novolin 70/30 16 units in the morning and 20 units in the evening. She was hypoglycemic with CBG in the 30s with EMS. Blood glucose initially improved to 200 after she was given an amp of dextrose by EMS. However, upon arrival to the ED she was hypoglycemic again with blood glucose in the 40s. hypoglycemia is due to poor oral intake in setting of insulin use.  Hold insulin.  Hypothermia Hypotension Temperature 94.6 F and was placed on external warmer. Temperature now improved. Hypothermia likely precipitated by severe hypoglycemia. Chest x-ray not suggestive of pneumonia. Hypotension likely due to dehydration. normal  cortisol level and TSH levels.  New onset A. Fib Found to be in A. fib with rate 140-150 initially with EMS. In the ED, she is rate controlled without receiving any AV nodal blocking agent or IV fluids. No documented history of A. Fib. CHA2DS2VASc 5 and was started on IV heparin for anticoagulation in the ED. Now on lovenox due to concern for valvular A fib. Echocardiogram still not read.  Cardiology consulted.  Mild AKI Likely prerenal from hypotension/dehydration. BUN 25, creatinine 1.1 (baseline 0.7). IV fluid hydration.   Recent fall Patient started on anticoagulation in the ED for A. Fib. She does report a recent fall,  head CT ruled out intracranial bleed.  Mild hypokalemia  Hypomagnesemia Monitor potassium and magnesium levels, replenish as needed.  Hypertension Cardiology  started the patient on Lopressor.  Blood pressure stable.  Hyperlipidemia continue home statin.  GERD -Continue Pepcid  Chronic anemia Hemoglobin at baseline. -Continue home iron supplement  Obesity Placing the patient at high risk for poor outcome. Patient has OSA not using CPAP secondary to difficulty getting up multiple times at night. Body mass index is 39.68 kg/m.   Diet: Cardiac diet DVT Prophylaxis: Subcutaneous Heparin    Advance goals of care discussion: DNR  Family Communication: family was present at bedside, at the time of interview.  The pt provided permission to discuss medical plan with the family. Opportunity was given to ask question and all questions were answered satisfactorily.   Disposition:  Status is: Inpatient  Remains inpatient appropriate because:Unsafe d/c plan patient with recurrent fall with multiple rib fractures at various stages of healing  Dispo: The patient is from: Home              Anticipated d/c is to: SNF              Patient currently is not medically stable to d/c.   Difficult to place patient No  Subjective: No nausea no vomiting.  No fever no chills.  No chest pain.  No abdominal pain.  No diarrhea.  Physical Exam:  General: Appear in mild distress, no Rash; Oral Mucosa Clear, moist. no Abnormal Neck Mass Or lumps, Conjunctiva normal  Cardiovascular: S1 and S2 Present, aortic systolic Murmur, Respiratory: good respiratory effort, Bilateral Air entry present and CTA, no Crackles, no wheezes Abdomen: Bowel Sound present, Soft and no tenderness Extremities: no Pedal edema Neurology: alert and oriented to time, place,  and person affect appropriate. no new focal deficit Gait not checked due to patient safety concerns  Vitals:   02/05/21 0052 02/05/21 0501 02/05/21 0937 02/05/21 1643  BP: 93/73 109/77 (!) 101/57 107/61  Pulse: 79 82 88 69  Resp: 16 16 18 18   Temp: 97.8 F (36.6 C) 98.3 F (36.8 C) 98.2 F (36.8 C) 98.1  F (36.7 C)  TempSrc: Oral Oral Oral Oral  SpO2: 93% 94% 96% 95%  Weight:      Height:        Intake/Output Summary (Last 24 hours) at 02/05/2021 1746 Last data filed at 02/05/2021 1729 Gross per 24 hour  Intake 990 ml  Output 600 ml  Net 390 ml   Filed Weights   02/04/21 0030 02/04/21 1523  Weight: 104.3 kg 98.4 kg    Data Reviewed: I have personally reviewed and interpreted daily labs, tele strips, imaging. I reviewed all nursing notes, pharmacy notes, vitals, pertinent old records I have discussed plan of care as described above with RN and patient/family.  CBC: Recent Labs  Lab 02/04/21 0031 02/05/21 0321 02/05/21 1023  WBC 6.8 6.6 6.8  NEUTROABS 3.9  --   --   HGB 11.5* 9.8* 10.8*  HCT 36.0 30.4* 32.4*  MCV 96.0 93.8 92.3  PLT 207 215 244   Basic Metabolic Panel: Recent Labs  Lab 02/04/21 0031 02/04/21 0755 02/05/21 1023  NA 137 135 133*  K 3.3* 3.9 4.2  CL 105 105 103  CO2 21* 20* 23  GLUCOSE 46* 81 274*  BUN 25* 22 16  CREATININE 1.11* 0.91 0.94  CALCIUM 8.6* 8.1* 8.6*  MG 1.5* 1.8 1.5*    Studies: No results found.  Scheduled Meds: . enoxaparin (LOVENOX) injection  100 mg Subcutaneous BID  . famotidine  40 mg Oral BID  . ferrous sulfate  325 mg Oral Q breakfast  . multivitamin with minerals  1 tablet Oral Daily  . senna-docusate  1 tablet Oral BID   Continuous Infusions: PRN Meds: acetaminophen, HYDROcodone-acetaminophen, ondansetron (ZOFRAN) IV  Time spent: 35 minutes  Author: Berle Mull, MD [facility] 02/05/2021 5:46 PM  To reach On-call, see care teams to locate the attending and reach out via [email or web address]. Between 7PM-7AM, please contact night-coverage If you still have difficulty reaching the attending provider, please page the [facility] (Director on Call) for [facility] on amion for assistance.

## 2021-02-05 NOTE — Progress Notes (Signed)
Rehab Admissions Coordinator Note:  Patient was screened by Cleatrice Burke for appropriateness for an Inpatient Acute Rehab Consult per therapy change in recommendation/PT. This patient unlikely to be approved for this diagnosis with [facility] based on current payor trends.   At this time, we are recommending other rehab venue options to be pursued .  Cleatrice Burke RN MSN 02/05/2021, 2:55 PM  I can be reached at [phone].

## 2021-02-05 NOTE — Evaluation (Signed)
Occupational Therapy Evaluation Patient Details Name: [name] [name] MRN: [id number] DOB: [birth year] Today's Date: 02/05/2021    History of Present Illness This 75 y.o. female after LOS with hypoglycemic event resulting in a fall.  Dx: hypoglycemia in the setting of IDDM, hypothermia, HTN, new onset A-Fib, mild AKI.  PMH includes: CA, GERD, DM   Clinical Impression   Pt admitted with above. She demonstrates the below listed deficits and will benefit from continued OT to maximize safety and independence with BADLs.  Pt presents to OT with generalized weakness, decreased activity tolerance, impaired function of Rt UE, and pain.  She currently requires set up - max A for ADLs, and mod A for functional transfers.  PTA, she reports she lived alone and was mod I with ADLs, excluding driving.  Feel she will require SNF level rehab at discharge.       Follow Up Recommendations  SNF    Equipment Recommendations  None recommended by OT    Recommendations for Other Services       Precautions / Restrictions Precautions Precautions: Fall Restrictions Weight Bearing Restrictions: No      Mobility Bed Mobility               General bed mobility comments: Pt sitting up in recliner    Transfers Overall transfer level: Needs assistance Equipment used: Rolling walker (2 wheeled) Transfers: Sit to/from [facility] Sit to Stand: Mod assist Stand pivot transfers: Min assist       General transfer comment: mod A to power up into standing    Balance Overall balance assessment: Needs assistance Sitting-balance support: Feet supported Sitting balance-Leahy Scale: Fair     Standing balance support: Bilateral upper extremity supported Standing balance-Leahy Scale: Poor                             ADL either performed or assessed with clinical judgement   ADL Overall ADL's : Needs assistance/impaired Eating/Feeding: Set up;Sitting   Grooming:  Wash/dry hands;Wash/dry face;Oral care;Brushing hair;Minimal assistance;Sitting   Upper Body Bathing: Minimal assistance;Sitting   Lower Body Bathing: Moderate assistance;Sit to/from stand   Upper Body Dressing : Moderate assistance;Sitting   Lower Body Dressing: Moderate assistance;Sit to/from stand   Toilet Transfer: Minimal assistance;Stand-pivot;BSC;RW   Toileting- Clothing Manipulation and Hygiene: Minimal assistance;Sit to/from stand       Functional mobility during ADLs: Minimal assistance;Rolling walker       Vision Patient Visual Report: No change from baseline       Perception     Praxis      Pertinent Vitals/Pain Pain Assessment: Faces Faces Pain Scale: Hurts even more Pain Location: Rt knee and Rt shoulder Pain Descriptors / Indicators: Grimacing;Guarding;Sharp Pain Intervention(s): Monitored during session     Hand Dominance Right   Extremity/Trunk Assessment Upper Extremity Assessment Upper Extremity Assessment: Generalized weakness;RUE deficits/detail RUE Deficits / Details: Shoulder strength Rt grossly 1+/5; self ROM WFL   Lower Extremity Assessment Lower Extremity Assessment: Defer to PT evaluation   Cervical / Trunk Assessment Cervical / Trunk Assessment: Kyphotic   Communication Communication Communication: No difficulties   Cognition Arousal/Alertness: Awake/alert Behavior During Therapy: WFL for tasks assessed/performed Overall Cognitive Status: Within Functional Limits for tasks assessed                                 General Comments: Pt appears to  have low health literacy   General Comments       Exercises Other Exercises Other Exercises: self ROM Rt shoulder flexion x 10   Shoulder Instructions      Home Living Family/patient expects to be discharged to:: Skilled nursing facility                                        Prior Functioning/Environment Level of Independence: Independent with  assistive device(s)        Comments: Pt reports she lives alone and is mod I with RW. She has had several falls recently.  She sponge bathes and does not drive        OT Problem List: Decreased strength;Decreased range of motion;Decreased activity tolerance;Impaired balance (sitting and/or standing);Decreased safety awareness;Obesity;Impaired UE functional use;Pain      OT Treatment/Interventions: Self-care/ADL training;Therapeutic exercise;Energy conservation;DME and/or AE instruction;Therapeutic activities;Patient/family education;Balance training    OT Goals(Current goals can be found in the care plan section) Acute Rehab OT Goals Patient Stated Goal: to be able to take care of self again OT Goal Formulation: With patient Time For Goal Achievement: 02/19/21 Potential to Achieve Goals: Good ADL Goals Pt Will Perform Grooming: with min assist;standing Pt Will Perform Upper Body Bathing: with set-up;with supervision;sitting Pt Will Perform Lower Body Bathing: with min assist;sit to/from stand Pt Will Perform Upper Body Dressing: with set-up;sitting Pt Will Perform Lower Body Dressing: sit to/from stand;with min assist Pt Will Transfer to Toilet: with min assist;ambulating;regular height toilet;bedside commode;grab bars Pt Will Perform Toileting - Clothing Manipulation and hygiene: with min assist;sit to/from stand Pt/caregiver will Perform Home Exercise Program: Increased ROM;Increased strength;Right Upper extremity;With Supervision;With written HEP provided  OT Frequency: Min 2X/week   Barriers to D/C: Decreased caregiver support          Co-evaluation              AM-PAC OT "6 Clicks" Daily Activity     Outcome Measure Help from another person eating meals?: A Little Help from another person taking care of personal grooming?: A Little Help from another person toileting, which includes using toliet, bedpan, or urinal?: A Lot Help from another person bathing (including  washing, rinsing, drying)?: A Lot Help from another person to put on and taking off regular upper body clothing?: A Lot Help from another person to put on and taking off regular lower body clothing?: A Lot 6 Click Score: 14   End of Session Equipment Utilized During Treatment: Surveyor, mining Communication: Mobility status  Activity Tolerance: Patient tolerated treatment well Patient left: in chair;with call bell/phone within reach;with family/visitor present  OT Visit Diagnosis: Unsteadiness on feet (R26.81);Pain Pain - Right/Left: Right Pain - part of body: Knee                Time: 4132-4401 OT Time Calculation (min): 16 min Charges:  OT General Charges $OT Visit: 1 Visit OT Evaluation $OT Eval Moderate Complexity: 1 Mod  Nilsa Nutting., OTR/L Acute Rehabilitation Services Pager [phone] Office [phone]   Lucille Passy M 02/05/2021, 1:50 PM

## 2021-02-05 NOTE — Progress Notes (Signed)
Physical Therapy Treatment Patient Details Name: [name] [name] MRN: [id number] DOB: Oct 03, 1946 Today's Date: 02/05/2021    History of Present Illness This 75 y.o. female after LOS with hypoglycemic event resulting in a fall.  Dx: hypoglycemia in the setting of IDDM, hypothermia, HTN, new onset A-Fib, mild AKI.  PMH includes: CA, GERD, DM    PT Comments    Pt received in chair with sister present. Needed max A to stand with cueing for hand placement, but steady with RW once in standing. Pt fatigues quickly, needing sitting break after walking 5-64f. SPO2 stable on RA.  Pt with better awareness of deficits. Understands it is unsafe for her to return home alone and agreeable to short term rehab upon discharge to address strength, endurance, and balance. Pt/family would like to be considered for CIR. Pt left in chair with all needs met, call bell within reach, and family present.     Follow Up Recommendations  CIR;Supervision for mobility/OOB     Equipment Recommendations  None recommended by PT    Recommendations for Other Services       Precautions / Restrictions Precautions Precautions: Fall Precaution Comments: R knee pain with any movemeent, Rib fx, tremors, L leg pain Restrictions Weight Bearing Restrictions: No Other Position/Activity Restrictions: prefers sitting more upright    Mobility  Bed Mobility               General bed mobility comments: Pt received sitting up in recliner    Transfers Overall transfer level: Needs assistance Equipment used: Rolling walker (2 wheeled) Transfers: Sit to/from Stand Sit to Stand: Max assist Stand pivot transfers: Min assist       General transfer comment: Slow to move hands to RW, cueing for hand placement  Ambulation/Gait Ambulation/Gait assistance: Min guard Gait Distance (Feet): 16 Feet (10+6) Assistive device: Rolling walker (2 wheeled) Gait Pattern/deviations: Step-to pattern;Decreased stride length;Trunk  flexed;Narrow base of support;Decreased weight shift to right Gait velocity: Decreased   General Gait Details: Min guard for safety, no LOB, very slow but steady with RW, cueing to maintain upright posture   Stairs             Wheelchair Mobility    Modified Rankin (Stroke Patients Only)       Balance Overall balance assessment: Needs assistance Sitting-balance support: Feet supported Sitting balance-Leahy Scale: Fair     Standing balance support: Bilateral upper extremity supported Standing balance-Leahy Scale: Poor Standing balance comment: Reliant on B UE support                            Cognition Arousal/Alertness: Awake/alert Behavior During Therapy: WFL for tasks assessed/performed Overall Cognitive Status: Within Functional Limits for tasks assessed                                 General Comments: Pt appears to have low health literacy      Exercises Other Exercises Other Exercises: self ROM Rt shoulder flexion x 10    General Comments General comments (skin integrity, edema, etc.): Pt fatiguing quickly, reports of R knee pain with mobility. VSS on RA. Unable to complete all measurements for orthostatics but no evidence of orthostatic hypotension from sit to stand      Pertinent Vitals/Pain Pain Assessment: Faces Faces Pain Scale: Hurts whole lot Pain Location: Rt knee and Rt shoulder Pain Descriptors / Indicators:  Grimacing;Guarding;Sharp Pain Intervention(s): Monitored during session     PT Goals (current goals can now be found in the care plan section) Acute Rehab PT Goals Patient Stated Goal: to be able to take care of self again    Frequency    Min 3X/week      PT Plan Discharge plan needs to be updated    Co-evaluation              AM-PAC PT "6 Clicks" Mobility   Outcome Measure  Help needed turning from your back to your side while in a flat bed without using bedrails?: A Lot Help needed  moving from lying on your back to sitting on the side of a flat bed without using bedrails?: A Lot Help needed moving to and from a bed to a chair (including a wheelchair)?: A Lot Help needed standing up from a chair using your arms (e.g., wheelchair or bedside chair)?: A Lot Help needed to walk in hospital room?: A Little Help needed climbing 3-5 steps with a railing? : Total 6 Click Score: 12    End of Session Equipment Utilized During Treatment: Oxygen Activity Tolerance: Patient limited by fatigue;Patient limited by pain Patient left: with call bell/phone within reach;in chair;with family/visitor present Nurse Communication: Mobility status PT Visit Diagnosis: Muscle weakness (generalized) (M62.81);Pain Pain - Right/Left: Right Pain - part of body: Knee     Time:  -     Charges:                        Rosita Kea, SPT

## 2021-02-05 NOTE — Plan of Care (Signed)
  Problem: Education: Goal: Knowledge of General Education information will improve Description Including pain rating scale, medication(s)/side effects and non-pharmacologic comfort measures Outcome: Progressing   

## 2021-02-06 ENCOUNTER — Inpatient Hospital Stay (HOSPITAL_COMMUNITY): Payer: Medicare HMO

## 2021-02-06 LAB — CBC
HCT: 29.8 % — ABNORMAL LOW (ref 36.0–46.0)
Hemoglobin: 10.1 g/dL — ABNORMAL LOW (ref 12.0–15.0)
MCH: 31.3 pg (ref 26.0–34.0)
MCHC: 33.9 g/dL (ref 30.0–36.0)
MCV: 92.3 fL (ref 80.0–100.0)
Platelets: 227 10*3/uL (ref 150–400)
RBC: 3.23 MIL/uL — ABNORMAL LOW (ref 3.87–5.11)
RDW: 14.3 % (ref 11.5–15.5)
WBC: 5.8 10*3/uL (ref 4.0–10.5)
nRBC: 0 % (ref 0.0–0.2)

## 2021-02-06 LAB — GLUCOSE, CAPILLARY
Glucose-Capillary: 131 mg/dL — ABNORMAL HIGH (ref 70–99)
Glucose-Capillary: 107 mg/dL — ABNORMAL HIGH (ref 70–99)
Glucose-Capillary: 114 mg/dL — ABNORMAL HIGH (ref 70–99)
Glucose-Capillary: 124 mg/dL — ABNORMAL HIGH (ref 70–99)
Glucose-Capillary: 136 mg/dL — ABNORMAL HIGH (ref 70–99)
Glucose-Capillary: 166 mg/dL — ABNORMAL HIGH (ref 70–99)
Glucose-Capillary: 137 mg/dL — ABNORMAL HIGH (ref 70–99)

## 2021-02-06 IMAGING — DX DG CHEST 1V PORT
1 series · 2 of 2 positions shown · non-contrast
Comparison: [DATE] chest radiograph and rib images

CLINICAL DATA: Shortness of breath.  Hypertension

EXAM:
PORTABLE CHEST 1 VIEW

[Series 1: chest · 0.14mm/px · 2 of 2 slices shown]
[im 1/2]
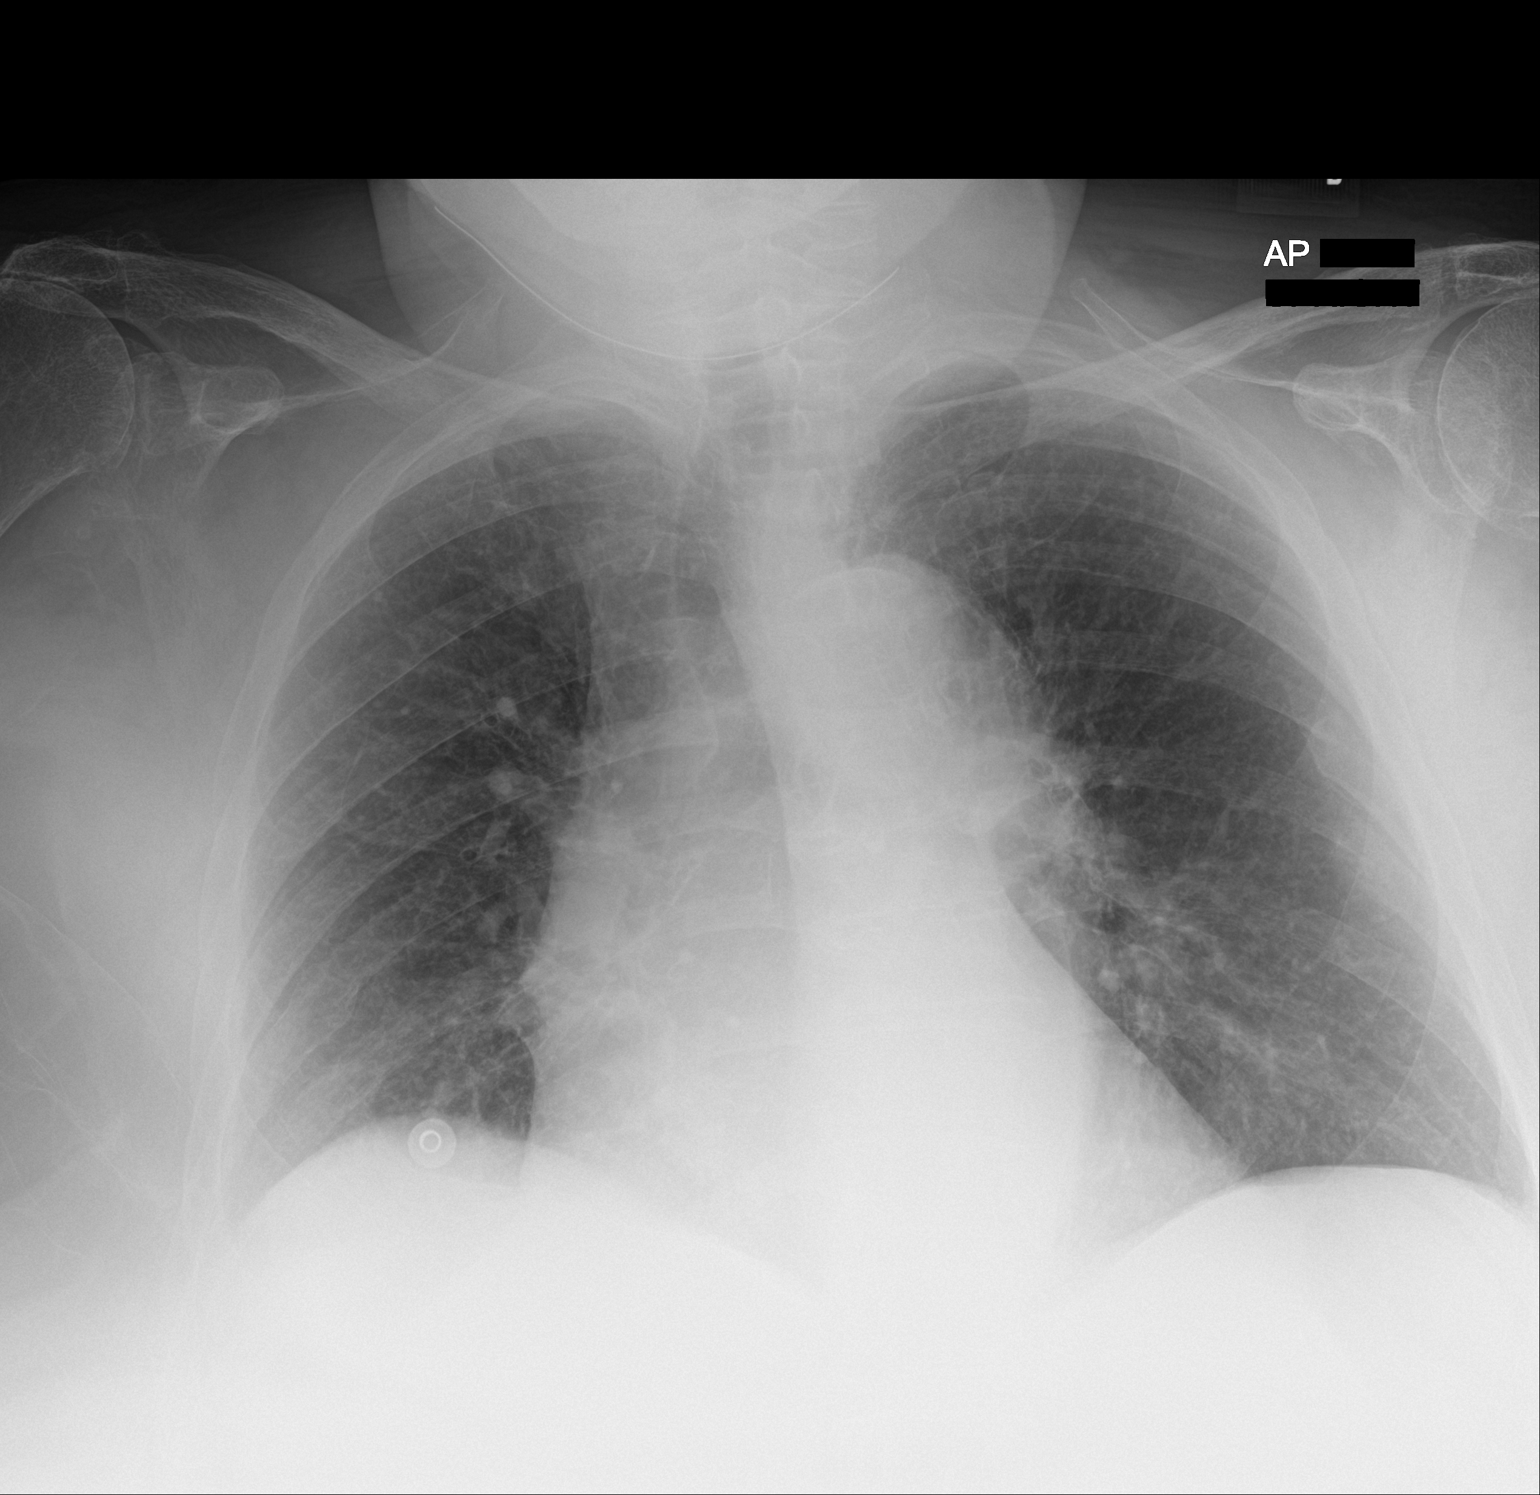
[im 2/2]
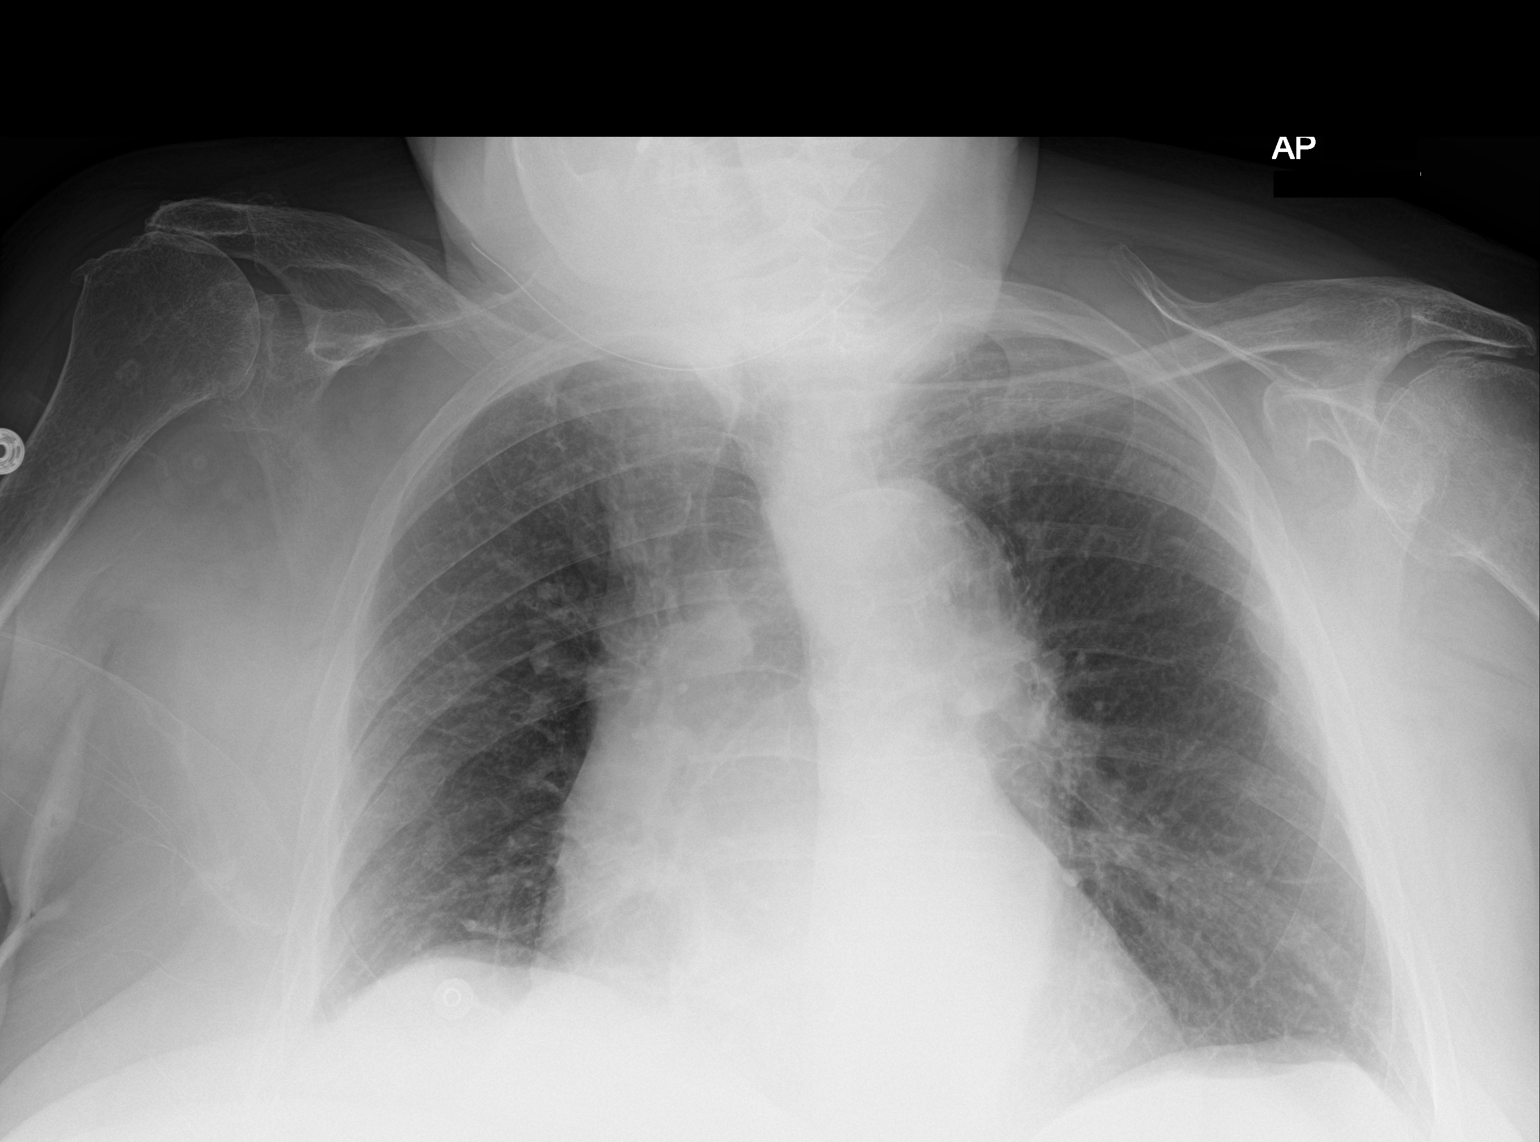

[2 of 2 positions shown; findings below may reference images not displayed]

FINDINGS: Lungs are clear. Heart is mildly enlarged with pulmonary vascularity
normal. Aorta is prominent with aortic atherosclerosis. No
adenopathy. Recent fracture right posterior eighth rib noted.
IMPRESSION: Lungs clear. Stable cardiac silhouette. Thoracic aortic prominence
is likely due to chronic hypertension. Recent fracture right eighth
rib posteriorly common noted on recent rib series.

Aortic Atherosclerosis ([HF]-[HF]).

## 2021-02-06 MED ORDER — INSULIN ASPART 100 UNIT/ML ~~LOC~~ SOLN
0.0000 [IU] | Freq: Three times a day (TID) | SUBCUTANEOUS | Status: DC
  Administered 2021-02-06: 1 [IU] via SUBCUTANEOUS
  Administered 2021-02-06: 2 [IU] via SUBCUTANEOUS
  Administered 2021-02-07 – 2021-02-08 (×3): 1 [IU] via SUBCUTANEOUS

## 2021-02-06 MED ORDER — INSULIN ASPART 100 UNIT/ML ~~LOC~~ SOLN: 0.0000 [IU] | Freq: Every day | SUBCUTANEOUS | Status: DC

## 2021-02-06 MED ORDER — APIXABAN 5 MG PO TABS
5.0000 mg | ORAL_TABLET | Freq: Two times a day (BID) | ORAL | Status: DC
  Administered 2021-02-06 – 2021-02-08 (×4): 5 mg via ORAL
  Filled 2021-02-06 (×4): qty 1

## 2021-02-06 NOTE — Progress Notes (Addendum)
[facility] Progress Note  Patient: [name] [name]    QBH:[id number]  DOA: 02/03/2021     Date of Service: the patient was seen and examined on 02/06/2021  Brief hospital course: Past medical history of IDDM, HTN, HLD, anxiety, anemia, morbid obesity, recurrent fall, sleep apnea not on CPAP, GERD. Presented with hypoglycemia, hypothermia as well as A. fib with RVR no acute metabolic encephalopathy currently resolved. Currently plan is arrange for safe discharge.  Assessment and Plan: Hypoglycemia Type 2 diabetes mellitus, uncontrolled with hypoglycemia with long-term insulin use and HLD. Her home insulin regimen includes Novolin 70/30 16 units in the morning and 20 units in the evening. She was hypoglycemic with CBG in the 30s with EMS. hypoglycemia is due to poor oral intake in setting of insulin use.  Hold insulin.  Hypothermia Hypotension Hypothermia likely precipitated by severe hypoglycemia. Hypotension likely due to dehydration. No evidence of acute infection.   normal  cortisol level and TSH levels.  New onset A. Fib with RVR. Found to be in A. fib with rate 140-150 initially with EMS. Currently rate controlled without receiving any AV nodal blocking agent. CHA2DS2VASc 5 and was started on IV heparin for anticoagulation in the ED. Now on lovenox.  Echocardiogram still not available in the chart but per cardiology okay to switch to Eliquis. Appreciate assistance. Will discuss regarding cost.  Mild AKI Likely prerenal from hypotension/dehydration. BUN 25, creatinine 1.1 (baseline 0.7). Treated with IV fluid hydration.  Recurrent fall Patient started on anticoagulation in the ED for A. Fib. She does report a recent fall,  head CT ruled out intracranial bleed. PT OT recommends SNF. Family wanted to discuss CIR although patient not a good candidate for CIR. Currently awaiting SNF.  Mild hypokalemia  Hypomagnesemia Corrected. Monitor  daily.  Hypertension Cardiology started the patient on Lopressor.  Blood pressure stable.  Hyperlipidemia continue home statin.  GERD -Continue Pepcid  Chronic anemia Hemoglobin at baseline. -Continue home iron supplement  Obesity Placing the patient at high risk for poor outcome. Patient has OSA not using CPAP secondary to difficulty getting up multiple times at night. Body mass index is 39.68 kg/m.   Diet: Cardiac diet DVT Prophylaxis: Subcutaneous Heparin    apixaban (ELIQUIS) tablet 5 mg    Advance goals of care discussion: DNR  Family Communication: family was present at bedside, at the time of interview.  The pt provided permission to discuss medical plan with the family. Opportunity was given to ask question and all questions were answered satisfactorily.   Disposition:  Status is: Inpatient  Remains inpatient appropriate because:Unsafe d/c plan patient with recurrent fall with multiple rib fractures at various stages of healing  Dispo: The patient is from: Home              Anticipated d/c is to: SNF              Patient currently is not medically stable to d/c.   Difficult to place patient No  Subjective: No nausea no vomiting.  No fever no chills.  No chest pain.  No abdominal pain.  Reports fatigue and tiredness and generalized weakness.  Physical Exam:  General: Appear in mild distress, no Rash; Oral Mucosa Clear, moist. no Abnormal Neck Mass Or lumps, Conjunctiva normal  Cardiovascular: S1 and S2 Present, no Murmur, Respiratory: good respiratory effort, Bilateral Air entry present and CTA, no Crackles, no wheezes Abdomen: Bowel Sound present, Soft and no tenderness Extremities: no Pedal edema Neurology: alert and  oriented to time, place, and person affect appropriate. no new focal deficit Gait not checked due to patient safety concerns  Vitals:   02/06/21 0459 02/06/21 0941 02/06/21 1100 02/06/21 1854  BP: 110/60 (!) 114/97  120/78  Pulse: 77  94  94  Resp: 20 20  20   Temp: 97.8 F (36.6 C) 97.9 F (36.6 C)  98 F (36.7 C)  TempSrc:  Oral  Oral  SpO2: 95% 97% 97% 98%  Weight:      Height:        Intake/Output Summary (Last 24 hours) at 02/06/2021 1913 Last data filed at 02/06/2021 1730 Gross per 24 hour  Intake 980 ml  Output 650 ml  Net 330 ml   Filed Weights   02/04/21 0030 02/04/21 1523  Weight: 104.3 kg 98.4 kg    Data Reviewed: I have personally reviewed and interpreted daily labs, tele strips, imaging. I reviewed all nursing notes, pharmacy notes, vitals, pertinent old records I have discussed plan of care as described above with RN and patient/family.  CBC: Recent Labs  Lab 02/04/21 0031 02/05/21 0321 02/05/21 1023 02/06/21 0351  WBC 6.8 6.6 6.8 5.8  NEUTROABS 3.9  --   --   --   HGB 11.5* 9.8* 10.8* 10.1*  HCT 36.0 30.4* 32.4* 29.8*  MCV 96.0 93.8 92.3 92.3  PLT 207 215 237 845   Basic Metabolic Panel: Recent Labs  Lab 02/04/21 0031 02/04/21 0755 02/05/21 1023  NA 137 135 133*  K 3.3* 3.9 4.2  CL 105 105 103  CO2 21* 20* 23  GLUCOSE 46* 81 274*  BUN 25* 22 16  CREATININE 1.11* 0.91 0.94  CALCIUM 8.6* 8.1* 8.6*  MG 1.5* 1.8 1.5*    Studies: DG CHEST PORT 1 VIEW  Result Date: 02/06/2021 CLINICAL DATA:  Shortness of breath.  Hypertension EXAM: PORTABLE CHEST 1 VIEW COMPARISON:  February 04, 2021 chest radiograph and rib images FINDINGS: Lungs are clear. Heart is mildly enlarged with pulmonary vascularity normal. Aorta is prominent with aortic atherosclerosis. No adenopathy. Recent fracture right posterior eighth rib noted. IMPRESSION: Lungs clear. Stable cardiac silhouette. Thoracic aortic prominence is likely due to chronic hypertension. Recent fracture right eighth rib posteriorly common noted on recent rib series. Aortic Atherosclerosis (ICD10-I70.0). Electronically Signed   By: Lowella Grip III M.D.   On: 02/06/2021 11:04    Scheduled Meds: . apixaban  5 mg Oral BID  . famotidine   40 mg Oral BID  . ferrous sulfate  325 mg Oral Q breakfast  . insulin aspart  0-5 Units Subcutaneous QHS  . insulin aspart  0-9 Units Subcutaneous TID WC  . multivitamin with minerals  1 tablet Oral Daily  . senna-docusate  1 tablet Oral BID   Continuous Infusions: PRN Meds: acetaminophen, HYDROcodone-acetaminophen, ondansetron (ZOFRAN) IV  Time spent: 35 minutes  Author: Berle Mull, MD [facility] 02/06/2021 7:13 PM  To reach On-call, see care teams to locate the attending and reach out via [email or web address]. Between 7PM-7AM, please contact night-coverage If you still have difficulty reaching the attending provider, please page the [facility] (Director on Call) for [facility] on amion for assistance.

## 2021-02-06 NOTE — Progress Notes (Signed)
ANTICOAGULATION CONSULT NOTE - Initial Consult  Pharmacy Consult for Lovenox >> Apixaban Indication: atrial fibrillation  Patient Measurements: Height: 5\' 2"  (157.5 cm) Weight: 98.4 kg (216 lb 14.9 oz) IBW/kg (Calculated) : 50.1  Vital Signs: Temp: 97.9 F (36.6 C) (03/11 0941) Temp Source: Oral (03/11 0941) BP: 114/97 (03/11 0941) Pulse Rate: 94 (03/11 0941)  Labs: Recent Labs    02/04/21 0031 02/04/21 0755 02/04/21 1658 02/05/21 0321 02/05/21 1023 02/06/21 0351  HGB 11.5*  --   --  9.8* 10.8* 10.1*  HCT 36.0  --   --  30.4* 32.4* 29.8*  PLT 207  --   --  215 237 227  HEPARINUNFRC  --  0.10* 0.23*  --   --   --   CREATININE 1.11* 0.91  --   --  0.94  --     Estimated Creatinine Clearance: 55.8 mL/min (by C-G formula based on SCr of 0.94 mg/dL).   Medical History: Past Medical History:  Diagnosis Date  . Anemia   . Arthritis   . Cancer [facility]) 2012   colon  . Diabetes mellitus without complication ([place])   . GERD (gastroesophageal reflux disease)   . Headache   . Hypertension   . Sleep apnea    has cpap, does not use cpap on regular basis    Assessment: 75 YOF transitioning from Lovenox to Apixaban for new Afib. Pharmacy consulted to dose.   The patient's last Lovenox dose was 3/11 AM - will plan to initiate Apixaban this evening. Dose of 5 mg bid appropriate for age<80, wt>60 kg, SCr<1.5.  Apixaban educational form placed in AVS and education performed with patient 3/11.  Goal of Therapy:  Appropriate anticoagulation for indication and hepatic/renal function    Plan:  - D/c Lovenox - Start Apixaban 5 mg po bid - first dose 3/11 evening - AVS in chart, education done - Patient desires to fill script at [facility] upon discharge - Pharmacy will sign off of consult and continue to monitor peripherally  Thank you for allowing pharmacy to be a part of this patient's care.  Alycia Rossetti, PharmD, BCPS Clinical Pharmacist Clinical phone for  02/06/2021: [phone] 02/06/2021 11:38 AM   **Pharmacist phone directory can now be found on [email or web address] ([email or web address]).  Listed under [place].

## 2021-02-06 NOTE — NC FL2 (Signed)
[place] LEVEL OF CARE SCREENING TOOL     IDENTIFICATION  Patient Name: [name] [name] Birthdate: Jul 25, 1946 Sex: female Admission Date (Current Location): 02/03/2021  [facility] and [place] Number:  Herbalist and Address:  [facility] [place], [place] [address] [place], [place] [address]      Provider Number: [id number]  Attending Physician Name and Address:  Lavina Hamman, MD  Relative Name and Phone Number:  Lovena Neighbours - sister; [phone]    Current Level of Care: Hospital Recommended Level of Care: [place] Prior Approval Number:    Date Approved/Denied:   PASRR Number: [id number]  Discharge Plan: SNF    Current Diagnoses: Patient Active Problem List   Diagnosis Date Noted   Hypoglycemia 02/04/2021   Hypothermia 02/04/2021   Hypotension 02/04/2021   Atrial fibrillation, new onset ([place]) 02/04/2021   AKI (acute kidney injury) ([place]) 02/04/2021   Atrial fibrillation with rapid ventricular response ([place]) 02/04/2021   HYPERLIPIDEMIA-MIXED 12/18/2010   HYPERTENSION, BENIGN 12/18/2010   SHORTNESS OF BREATH 12/18/2010   OBSTRUCTIVE SLEEP APNEA 01/11/2008    Orientation RESPIRATION BLADDER Height & Weight     Self,Time,Situation,Place  Normal Incontinent (External catheter) Weight: 216 lb 14.9 oz (98.4 kg) Height:  5\' 2"  (157.5 cm)  BEHAVIORAL SYMPTOMS/MOOD NEUROLOGICAL BOWEL NUTRITION STATUS      Continent Diet (Regular)  AMBULATORY STATUS COMMUNICATION OF NEEDS Skin   Limited Assist Verbally Skin abrasions (Abrasion bilateral knee; Ecchymosis arm, shoulder chest)                       Personal Care Assistance Level of Assistance  Bathing,Feeding,Dressing Bathing Assistance: Maximum assistance (Upper body min assist) Feeding assistance: Limited assistance (assistance with set-up) Dressing Assistance: Maximum assistance     Functional Limitations Info  Sight,Hearing,Speech Sight Info:  Adequate Hearing Info: Adequate Speech Info: Adequate    SPECIAL CARE FACTORS FREQUENCY  PT (By licensed PT),OT (By licensed OT)     PT Frequency: Evaluated 3/9. PT at SNF eval and treat a minimum of 5 days per week OT Frequency: Evaluated 3/10. OT at SNF eval and treat a minimum 28f 5 days per week            Contractures Contractures Info: Not present    Additional Factors Info  Code Status,Allergies,Insulin Sliding Scale Code Status Info: DNR Allergies Info: Codeine and Oxycodone-acetaminophen   Insulin Sliding Scale Info: 0-5 Units daily at bedtime; 0-9 Units 3 times per a day with meals       Current Medications (02/06/2021):  This is the current hospital active medication list Current Facility-Administered Medications  Medication Dose Route Frequency Provider Last Rate Last Admin   acetaminophen (TYLENOL) tablet 650 mg  650 mg Oral Q6H PRN Lavina Hamman, MD   650 mg at 02/06/21 1455   apixaban (ELIQUIS) tablet 5 mg  5 mg Oral BID Rolla Flatten, RPH       famotidine (PEPCID) tablet 40 mg  40 mg Oral BID Shela Leff, MD   40 mg at 02/06/21 0840   ferrous sulfate tablet 325 mg  325 mg Oral Q breakfast Shela Leff, MD   325 mg at 02/06/21 0840   HYDROcodone-acetaminophen (NORCO/VICODIN) 5-325 MG per tablet 1 tablet  1 tablet Oral Q4H PRN Lavina Hamman, MD   1 tablet at 02/06/21 0549   insulin aspart (novoLOG) injection 0-5 Units  0-5 Units Subcutaneous QHS Lavina Hamman, MD  insulin aspart (novoLOG) injection 0-9 Units  0-9 Units Subcutaneous TID WC Lavina Hamman, MD   2 Units at 02/06/21 1729   multivitamin with minerals tablet 1 tablet  1 tablet Oral Daily Shela Leff, MD   1 tablet at 02/06/21 0840   ondansetron (ZOFRAN) injection 4 mg  4 mg Intravenous Q6H PRN Lavina Hamman, MD       senna-docusate (Senokot-S) tablet 1 tablet  1 tablet Oral BID Lavina Hamman, MD   1 tablet at 02/06/21 0840     Discharge  Medications: Please see discharge summary for a list of discharge medications.  Relevant Imaging Results:  Relevant Lab Results:   Additional Information [id number]. Patient reported that she has a CPAP, but does not use regularly. Patient has not had the COVID vaccinations  Jjesus Dingley, Mila Homer, LCSW

## 2021-02-06 NOTE — Discharge Instructions (Addendum)

## 2021-02-06 NOTE — TOC Progression Note (Signed)
Transition of Care [facility]) - Progression Note    Patient Details  Name: [name] [name] MRN: [id number] Date of Birth: 04/27/1946  Transition of Care [facility]) CM/SW Contact  Sharlet Salina Mila Homer, LCSW Phone Number: 02/06/2021, 8:13 PM  Clinical Narrative:  Talked with sister regarding SNF placement and she cited her preference as [facility]. Sister also shared about being the caregiver for at least 2 of her siblings and the emotional and physical toll it is taking on her. CSW listened empathetically and provided encouragement.      Expected Discharge Plan: [place] Barriers to Discharge: Continued Medical Work up  Expected Discharge Plan and Services Expected Discharge Plan: [place] In-house Referral: Clinical Social Work     Living arrangements for the past 2 months: Single Family Home                                     Social Determinants of Health (SDOH) Interventions  None requested or needed at this time  Readmission Risk Interventions No flowsheet data found.

## 2021-02-06 NOTE — Progress Notes (Signed)
Subjective:  Doing well denies any chest pain or shortness of breath.  Remains in A. fib with controlled ventricular response.  Hemoglobin stable.  Objective:  Vital Signs in the last 24 hours: Temp:  [97.8 F (36.6 C)-98.3 F (36.8 C)] 97.9 F (36.6 C) (03/11 0941) Pulse Rate:  [77-94] 94 (03/11 0941) Resp:  [18-20] 20 (03/11 0941) BP: (94-114)/(58-97) 114/97 (03/11 0941) SpO2:  [95 %-97 %] 97 % (03/11 1100)  Intake/Output from previous day: 03/10 0701 - 03/11 0700 In: 990 [P.O.:940; IV Piggyback:50] Out: 650 [Urine:650] Intake/Output from this shift: Total I/O In: 220 [P.O.:220] Out: -   Physical Exam: Exam unchanged  Lab Results: Recent Labs    02/05/21 1023 02/06/21 0351  WBC 6.8 5.8  HGB 10.8* 10.1*  PLT 237 227   Recent Labs    02/04/21 0755 02/05/21 1023  NA 135 133*  K 3.9 4.2  CL 105 103  CO2 20* 23  GLUCOSE 81 274*  BUN 22 16  CREATININE 0.91 0.94   No results for input(s): TROPONINI in the last 72 hours.  Invalid input(s): CK, MB Hepatic Function Panel Recent Labs    02/04/21 0031  PROT 5.8*  ALBUMIN 3.0*  AST 61*  ALT 38  ALKPHOS 48  BILITOT 0.8   No results for input(s): CHOL in the last 72 hours. No results for input(s): PROTIME in the last 72 hours.  Imaging: Imaging results have been reviewed and DG CHEST PORT 1 VIEW  Result Date: 02/06/2021 CLINICAL DATA:  Shortness of breath.  Hypertension EXAM: PORTABLE CHEST 1 VIEW COMPARISON:  February 04, 2021 chest radiograph and rib images FINDINGS: Lungs are clear. Heart is mildly enlarged with pulmonary vascularity normal. Aorta is prominent with aortic atherosclerosis. No adenopathy. Recent fracture right posterior eighth rib noted. IMPRESSION: Lungs clear. Stable cardiac silhouette. Thoracic aortic prominence is likely due to chronic hypertension. Recent fracture right eighth rib posteriorly common noted on recent rib series. Aortic Atherosclerosis (ICD10-I70.0). Electronically Signed   By:  Lowella Grip III M.D.   On: 02/06/2021 11:04    Cardiac Studies:  Assessment/Plan:  A. Fib with controlled ventricular response chads vasc score of 5 Status post hypoglycemic episode with altered mental status secondary to meds. Moderate aortic stenosis. In the past. Hypertension. Insulin-requiring diabetes mellitus. Hyperlipidemia. Morbid obesity. History of CA of the colon in the past. History of remote tobacco abuse. Degenerative joint disease. History of remote tobacco abuse Chronic anemia stable History of recurrent fall and acute fractures of the right 8 rib History of fall and sixth rib fracture in the past Plan Okay to switch Lovenox to Eliquis 5 mg twice daily Patient will be going to a skilled nursing facility and will be monitored closely.  LOS: 2 days    Charolette Forward 02/06/2021, 5:23 PM

## 2021-02-06 NOTE — TOC Benefit Eligibility Note (Signed)
Transition of Care [facility]) Benefit Eligibility Note    Patient Details  Name: [name] [name] MRN: [id number] Date of Birth: July 16, 1946   Medication/Dose: Eliquis 5mg . bid , Warfarin 4mg .daily  Covered?: No (Xarelto20mg  daily)  Tier:  (Eliquis teir 4,Warfarin teir 1)  Prescription Coverage [facility]  Spoke with Person/Company/Phone Number:: Carrie C. [facility] [phone]  Co-Pay: Eliquis 5mg  bid $47.00 [employer] co-pay  Prior Approval: No (Xarelto 20 mg . daily)  Deductible:  (no deductible)       Shelda Altes Phone Number: 02/06/2021, 2:25 PM

## 2021-02-07 LAB — CBC
HCT: 28.8 % — ABNORMAL LOW (ref 36.0–46.0)
Hemoglobin: 9.3 g/dL — ABNORMAL LOW (ref 12.0–15.0)
MCH: 30.5 pg (ref 26.0–34.0)
MCHC: 32.3 g/dL (ref 30.0–36.0)
MCV: 94.4 fL (ref 80.0–100.0)
Platelets: 190 10*3/uL (ref 150–400)
RBC: 3.05 MIL/uL — ABNORMAL LOW (ref 3.87–5.11)
RDW: 14.3 % (ref 11.5–15.5)
WBC: 4.8 10*3/uL (ref 4.0–10.5)
nRBC: 0 % (ref 0.0–0.2)

## 2021-02-07 LAB — BASIC METABOLIC PANEL
Anion gap: 6 (ref 5–15)
BUN: 15 mg/dL (ref 8–23)
CO2: 24 mmol/L (ref 22–32)
Calcium: 8.5 mg/dL — ABNORMAL LOW (ref 8.9–10.3)
Chloride: 106 mmol/L (ref 98–111)
Creatinine, Ser: 0.84 mg/dL (ref 0.44–1.00)
GFR, Estimated: >60 mL/min (ref >60)
Glucose, Bld: 121 mg/dL — ABNORMAL HIGH (ref 70–99)
Potassium: 3.9 mmol/L (ref 3.5–5.1)
Sodium: 136 mmol/L (ref 135–145)

## 2021-02-07 LAB — GLUCOSE, CAPILLARY
Glucose-Capillary: 115 mg/dL — ABNORMAL HIGH (ref 70–99)
Glucose-Capillary: 139 mg/dL — ABNORMAL HIGH (ref 70–99)
Glucose-Capillary: 150 mg/dL — ABNORMAL HIGH (ref 70–99)
Glucose-Capillary: 127 mg/dL — ABNORMAL HIGH (ref 70–99)

## 2021-02-07 LAB — MAGNESIUM: Magnesium: 1.6 mg/dL — ABNORMAL LOW (ref 1.7–2.4)

## 2021-02-07 NOTE — TOC Progression Note (Addendum)
Transition of Care [facility]) - Progression Note    Patient Details  Name: [name] [name] MRN: [id number] Date of Birth: June 24, 1946  Transition of Care [facility]) CM/SW [place], [place] Phone Number: 02/07/2021, 12:49 PM  Clinical Narrative:     CSW provided bed offers to pt's sister. She was given [place] and [facility]. She will follow up with CSW when she has made a choice. Facility will need to start insurance auth and will need a covid within 48 hours. SW will continue to follow.   Expected Discharge Plan: [place] Barriers to Discharge: Continued Medical Work up  Expected Discharge Plan and Services Expected Discharge Plan: [place] In-house Referral: Clinical Social Work     Living arrangements for the past 2 months: Single Family Home                                       Social Determinants of Health (SDOH) Interventions    Readmission Risk Interventions No flowsheet data found.

## 2021-02-07 NOTE — Progress Notes (Signed)
Subjective:  Patient denies any chest pain palpitations or shortness of breath states overall feels better.  Rib pain has improved.  2D echo results still not in epic.  Talk to the echo tech yesterday we will try to get in touch with epic to release the results.  Objective:  Vital Signs in the last 24 hours: Temp:  [98 F (36.7 C)-98.3 F (36.8 C)] 98.2 F (36.8 C) (03/12 0915) Pulse Rate:  [69-94] 87 (03/12 0915) Resp:  [16-20] 18 (03/12 0915) BP: (94-120)/(53-78) 94/73 (03/12 0915) SpO2:  [94 %-98 %] 94 % (03/12 0915)  Intake/Output from previous day: 03/11 0701 - 03/12 0700 In: 740 [P.O.:740] Out: 900 [Urine:900] Intake/Output from this shift: Total I/O In: 240 [P.O.:240] Out: 325 [Urine:325]  Physical Exam: Neck: no adenopathy, no carotid bruit, no JVD and supple, symmetrical, trachea midline Lungs: clear to auscultation bilaterally Heart: irregularly irregular rhythm, S1, S2 normal and 2/6 systolic murmur noted Abdomen: soft, non-tender; bowel sounds normal; no masses,  no organomegaly Extremities: extremities normal, atraumatic, no cyanosis or edema  Lab Results: Recent Labs    02/06/21 0351 02/07/21 0323  WBC 5.8 4.8  HGB 10.1* 9.3*  PLT 227 190   Recent Labs    02/05/21 1023 02/07/21 0323  NA 133* 136  K 4.2 3.9  CL 103 106  CO2 23 24  GLUCOSE 274* 121*  BUN 16 15  CREATININE 0.94 0.84   No results for input(s): TROPONINI in the last 72 hours.  Invalid input(s): CK, MB Hepatic Function Panel No results for input(s): PROT, ALBUMIN, AST, ALT, ALKPHOS, BILITOT, BILIDIR, IBILI in the last 72 hours. No results for input(s): CHOL in the last 72 hours. No results for input(s): PROTIME in the last 72 hours.  Imaging: Imaging results have been reviewed and DG CHEST PORT 1 VIEW  Result Date: 02/06/2021 CLINICAL DATA:  Shortness of breath.  Hypertension EXAM: PORTABLE CHEST 1 VIEW COMPARISON:  February 04, 2021 chest radiograph and rib images FINDINGS: Lungs are  clear. Heart is mildly enlarged with pulmonary vascularity normal. Aorta is prominent with aortic atherosclerosis. No adenopathy. Recent fracture right posterior eighth rib noted. IMPRESSION: Lungs clear. Stable cardiac silhouette. Thoracic aortic prominence is likely due to chronic hypertension. Recent fracture right eighth rib posteriorly common noted on recent rib series. Aortic Atherosclerosis (ICD10-I70.0). Electronically Signed   By: Lowella Grip III M.D.   On: 02/06/2021 11:04    Cardiac Studies:  Assessment/Plan:  A. Fib with controlled ventricular response chads vasc score of 5 Status post hypoglycemic episode with altered mental status secondary to meds. Moderate aortic stenosis. In the past. Hypertension. Insulin-requiring diabetes mellitus. Hyperlipidemia. Morbid obesity. History of CA of the colon in the past. History of remote tobacco abuse. Degenerative joint disease. History of remote tobacco abuse Chronic anemia stable History of recurrent fall and acute fractures of the right 8 rib History of fall and sixth rib fracture in the past Plan Continue present management Increase ambulation as tolerated Awaiting skilled nursing facility  LOS: 3 days    Charolette Forward 02/07/2021, 11:08 AM

## 2021-02-07 NOTE — Plan of Care (Signed)
  Problem: Coping: Goal: Level of anxiety will decrease Outcome: Completed/Met   Problem: Health Behavior/Discharge Planning: Goal: Ability to manage health-related needs will improve Outcome: Progressing   Problem: Safety: Goal: Ability to remain free from injury will improve Outcome: Progressing

## 2021-02-07 NOTE — Progress Notes (Signed)
[facility] Progress Note  Patient: [name] [name]    UKG:[id number]  DOA: 02/03/2021     Date of Service: the patient was seen and examined on 02/07/2021  Brief hospital course: Past medical history of IDDM, HTN, HLD, anxiety, anemia, morbid obesity, recurrent fall, sleep apnea not on CPAP, GERD. Presented with hypoglycemia, hypothermia as well as A. fib with RVR no acute metabolic encephalopathy currently resolved. Currently plan is arrange for safe discharge.  Assessment and Plan: Hypoglycemia Type 2 diabetes mellitus, uncontrolled with hypoglycemia with long-term insulin use and HLD. Her home insulin regimen includes Novolin 70/30 16 units in the morning and 20 units in the evening. She was hypoglycemic with CBG in the 30s with EMS. hypoglycemia is due to poor oral intake in setting of insulin use.  Hold insulin on a scheduled basis.  Hypothermia Hypotension Hypothermia likely precipitated by severe hypoglycemia. Hypotension likely due to dehydration. No evidence of acute infection.   normal  cortisol level and TSH levels.  New onset A. Fib with RVR. Found to be in A. fib with rate 140-150 initially with EMS. Currently rate controlled without receiving any AV nodal blocking agent. CHA2DS2VASc 5 and was started on IV heparin for anticoagulation in the ED. Now on lovenox.  Echocardiogram still not available in the chart but per cardiology okay to switch to Eliquis. Appreciate assistance. Appears that the patient has significant cost for Xarelto and Eliquis. Patient would like to minimize her cost for long-term medication and therefore would like to transition to Coumadin if needed. We will discuss with them.  Mild AKI resolved Likely prerenal from hypotension/dehydration. BUN 25, creatinine 1.1 (baseline 0.7). Treated with IV fluid hydration.  Recurrent fall Patient started on anticoagulation in the ED for A. Fib. She does report a recent fall,  head CT ruled out  intracranial bleed. PT OT recommends SNF. Family wanted to discuss CIR although patient not a good candidate for CIR. Currently awaiting SNF.  Mild hypokalemia  Hypomagnesemia Corrected. Monitor daily.  Hypertension Cardiology started the patient on Lopressor.  Blood pressure stable.  Hyperlipidemia continue home statin.  GERD -Continue Pepcid  Chronic anemia Hemoglobin at baseline. -Continue home iron supplement  Obesity Placing the patient at high risk for poor outcome. Patient has OSA not using CPAP secondary to difficulty getting up multiple times at night. Body mass index is 39.68 kg/m.   Diet: Cardiac diet DVT Prophylaxis: Subcutaneous Heparin    apixaban (ELIQUIS) tablet 5 mg   Advance goals of care discussion: DNR  Family Communication: family was present at bedside, at the time of interview.  The pt provided permission to discuss medical plan with the family. Opportunity was given to ask question and all questions were answered satisfactorily.   Disposition:  Status is: Inpatient  Remains inpatient appropriate because:Unsafe d/c plan patient with recurrent fall with multiple rib fractures at various stages of healing  Dispo: The patient is from: Home              Anticipated d/c is to: SNF              Patient currently is not medically stable to d/c.   Difficult to place patient No  Subjective: No nausea no vomiting but no fever no chills.  No chest pain.  Abdominal pain.  Continues to have rib cage pain.  On right  Physical Exam:  General: Appear in mild distress, no Rash; Oral Mucosa Clear, moist. no Abnormal Neck Mass Or lumps, Conjunctiva normal  Cardiovascular: S1 and S2 Present, no Murmur, Respiratory: good respiratory effort, Bilateral Air entry present and CTA, no Crackles, no wheezes Abdomen: Bowel Sound present, Soft and no tenderness Extremities: no Pedal edema Neurology: alert and oriented to time, place, and person affect  appropriate. no new focal deficit Gait not checked due to patient safety concerns  Vitals:   02/06/21 1956 02/07/21 0503 02/07/21 0915 02/07/21 1644  BP: (!) 97/53 (!) 104/55 94/73 107/78  Pulse: 72 69 87 87  Resp: 16 20 18 18   Temp: 98.3 F (36.8 C) 98 F (36.7 C) 98.2 F (36.8 C) 98 F (36.7 C)  TempSrc: Oral Oral    SpO2: 98%  94% 100%  Weight:      Height:        Intake/Output Summary (Last 24 hours) at 02/07/2021 1956 Last data filed at 02/07/2021 1735 Gross per 24 hour  Intake 600 ml  Output 1225 ml  Net -625 ml   Filed Weights   02/04/21 0030 02/04/21 1523  Weight: 104.3 kg 98.4 kg    Data Reviewed: I have personally reviewed and interpreted daily labs, tele strips, imaging. I reviewed all nursing notes, pharmacy notes, vitals, pertinent old records I have discussed plan of care as described above with RN and patient/family.  CBC: Recent Labs  Lab 02/04/21 0031 02/05/21 0321 02/05/21 1023 02/06/21 0351 02/07/21 0323  WBC 6.8 6.6 6.8 5.8 4.8  NEUTROABS 3.9  --   --   --   --   HGB 11.5* 9.8* 10.8* 10.1* 9.3*  HCT 36.0 30.4* 32.4* 29.8* 28.8*  MCV 96.0 93.8 92.3 92.3 94.4  PLT 207 215 237 227 834   Basic Metabolic Panel: Recent Labs  Lab 02/04/21 0031 02/04/21 0755 02/05/21 1023 02/07/21 0323  NA 137 135 133* 136  K 3.3* 3.9 4.2 3.9  CL 105 105 103 106  CO2 21* 20* 23 24  GLUCOSE 46* 81 274* 121*  BUN 25* 22 16 15   CREATININE 1.11* 0.91 0.94 0.84  CALCIUM 8.6* 8.1* 8.6* 8.5*  MG 1.5* 1.8 1.5* 1.6*    Studies: No results found.  Scheduled Meds: . apixaban  5 mg Oral BID  . famotidine  40 mg Oral BID  . ferrous sulfate  325 mg Oral Q breakfast  . insulin aspart  0-5 Units Subcutaneous QHS  . insulin aspart  0-9 Units Subcutaneous TID WC  . multivitamin with minerals  1 tablet Oral Daily  . senna-docusate  1 tablet Oral BID   Continuous Infusions: PRN Meds: acetaminophen, HYDROcodone-acetaminophen, ondansetron (ZOFRAN) IV  Time spent:  35 minutes  Author: Berle Mull, MD [facility] 02/07/2021 7:56 PM  To reach On-call, see care teams to locate the attending and reach out via [email or web address]. Between 7PM-7AM, please contact night-coverage If you still have difficulty reaching the attending provider, please page the [facility] (Director on Call) for [facility] on amion for assistance.

## 2021-02-07 NOTE — Progress Notes (Signed)
Physical Therapy Treatment Patient Details Name: [name] [name] MRN: [id number] DOB: 1946-04-12 Today's Date: 02/07/2021    History of Present Illness This 75 y.o. female after LOS with hypoglycemic event resulting in a fall.  Dx: hypoglycemia in the setting of IDDM, hypothermia, HTN, new onset A-Fib, mild AKI.  PMH includes: CA, GERD, DM    PT Comments    Pt progressing nicely towards goals. Ambulating min guard with RW slowly up to 55 feet this date. Slow to rise from chair with moderate effort but no physical assistance from PT. Pt states she is considering going home with sister stopping by regularly to help her as needed. Spent time discussing risks of going home without full assistance. She would benefit from SNF level care but if decides to go home against advice, she should have HHPT to help progress her safety with mobility. Will continue to follow and update recs as appropriate.    Follow Up Recommendations  SNF;Other (comment) Pt not candidate for CIR - Recommend SNF level of care however pt now stating she would like to go home. If pt decides to return home over recommended dispo, consider HHPT.     Equipment Recommendations  None recommended by PT    Recommendations for Other Services       Precautions / Restrictions Precautions Precautions: Fall Precaution Comments: R knee pain with any movemeent, Rib fx, tremors, L leg pain Restrictions Weight Bearing Restrictions: No    Mobility  Bed Mobility               General bed mobility comments: Pt received sitting up in recliner    Transfers Overall transfer level: Needs assistance Equipment used: Rolling walker (2 wheeled) Transfers: Sit to/from Stand Sit to Stand: Min guard         General transfer comment: Min guard for safety. Slow but steady once upright. Effortful to rise, some pain in Rt knee reported. No physical assist required this date.  Ambulation/Gait Ambulation/Gait assistance: Min  guard Gait Distance (Feet): 55 Feet Assistive device: Rolling walker (2 wheeled) Gait Pattern/deviations: Step-to pattern;Decreased stride length;Trunk flexed;Narrow base of support;Decreased weight shift to right Gait velocity: Decreased   General Gait Details: Min guard for safety, Rt knee pain reported (baseline OA,) no buckling reported. Pt demonstrates appropriate use of RW after practicing and reviewing techniques.   Stairs             Wheelchair Mobility    Modified Rankin (Stroke Patients Only)       Balance Overall balance assessment: Needs assistance Sitting-balance support: Feet supported Sitting balance-Leahy Scale: Fair     Standing balance support: No upper extremity supported Standing balance-Leahy Scale: Fair                              Cognition Arousal/Alertness: Awake/alert Behavior During Therapy: WFL for tasks assessed/performed Overall Cognitive Status: Within Functional Limits for tasks assessed                                        Exercises      General Comments General comments (skin integrity, edema, etc.): SpO2 98% on room air, mod DOE after ambulating.      Pertinent Vitals/Pain Pain Assessment: 0-10 Pain Score: 4  Pain Location: Rt knee and Rt shoulder Pain Descriptors / Indicators: Guarding;Sharp Pain Intervention(s):  Limited activity within patient's tolerance;Monitored during session    Home Living                      Prior Function            PT Goals (current goals can now be found in the care plan section) Acute Rehab PT Goals PT Goal Formulation: With patient Time For Goal Achievement: 02/18/21 Potential to Achieve Goals: Good Progress towards PT goals: Progressing toward goals    Frequency    Min 3X/week      PT Plan Current plan remains appropriate    Co-evaluation              AM-PAC PT "6 Clicks" Mobility   Outcome Measure  Help needed turning from  your back to your side while in a flat bed without using bedrails?: A Little Help needed moving from lying on your back to sitting on the side of a flat bed without using bedrails?: A Little Help needed moving to and from a bed to a chair (including a wheelchair)?: A Little Help needed standing up from a chair using your arms (e.g., wheelchair or bedside chair)?: A Little Help needed to walk in hospital room?: A Little Help needed climbing 3-5 steps with a railing? : A Lot 6 Click Score: 17    End of Session   Activity Tolerance: Patient tolerated treatment well Patient left: in chair;with call bell/phone within reach Nurse Communication: Mobility status PT Visit Diagnosis: Muscle weakness (generalized) (M62.81);Pain Pain - Right/Left: Right Pain - part of body: Knee     Time: 1027-2536 PT Time Calculation (min) (ACUTE ONLY): 18 min  Charges:  $Gait Training: 8-22 mins                     [employer], PT    Ellouise Newer 02/07/2021, 2:08 PM

## 2021-02-08 LAB — CBC
HCT: 28.5 % — ABNORMAL LOW (ref 36.0–46.0)
Hemoglobin: 9.6 g/dL — ABNORMAL LOW (ref 12.0–15.0)
MCH: 31.5 pg (ref 26.0–34.0)
MCHC: 33.7 g/dL (ref 30.0–36.0)
MCV: 93.4 fL (ref 80.0–100.0)
Platelets: 212 10*3/uL (ref 150–400)
RBC: 3.05 MIL/uL — ABNORMAL LOW (ref 3.87–5.11)
RDW: 14.3 % (ref 11.5–15.5)
WBC: 4.6 10*3/uL (ref 4.0–10.5)
nRBC: 0 % (ref 0.0–0.2)

## 2021-02-08 LAB — BASIC METABOLIC PANEL
Anion gap: 7 (ref 5–15)
BUN: 14 mg/dL (ref 8–23)
CO2: 25 mmol/L (ref 22–32)
Calcium: 8.7 mg/dL — ABNORMAL LOW (ref 8.9–10.3)
Chloride: 105 mmol/L (ref 98–111)
Creatinine, Ser: 0.69 mg/dL (ref 0.44–1.00)
GFR, Estimated: >60 mL/min (ref >60)
Glucose, Bld: 113 mg/dL — ABNORMAL HIGH (ref 70–99)
Potassium: 3.8 mmol/L (ref 3.5–5.1)
Sodium: 137 mmol/L (ref 135–145)

## 2021-02-08 LAB — GLUCOSE, CAPILLARY
Glucose-Capillary: 114 mg/dL — ABNORMAL HIGH (ref 70–99)
Glucose-Capillary: 144 mg/dL — ABNORMAL HIGH (ref 70–99)

## 2021-02-08 LAB — MAGNESIUM: Magnesium: 1.5 mg/dL — ABNORMAL LOW (ref 1.7–2.4)

## 2021-02-08 MED ORDER — MAGNESIUM SULFATE 2 GM/50ML IV SOLN
2.0000 g | Freq: Once | INTRAVENOUS | Status: AC
  Administered 2021-02-08: 2 g via INTRAVENOUS
  Filled 2021-02-08: qty 50

## 2021-02-08 MED ORDER — MAGNESIUM OXIDE 400 MG PO CAPS: 400.0000 mg | ORAL_CAPSULE | Freq: Every day | ORAL | 0 refills | Status: AC

## 2021-02-08 MED ORDER — APIXABAN 5 MG PO TABS: 5.0000 mg | ORAL_TABLET | Freq: Two times a day (BID) | ORAL | 0 refills | Status: AC

## 2021-02-08 NOTE — Progress Notes (Signed)
Nsg Discharge Note  Admit Date:  02/03/2021 Discharge date: 02/08/2021   Birdie Sons to be D/C'd  per MD order.  AVS completed.  Patient/caregiver able to verbalize understanding.  Discharge Medication: Allergies as of 02/08/2021      Reactions   Codeine Other (See Comments)   headache   Oxycodone-acetaminophen Nausea And Vomiting      Medication List    STOP taking these medications   ALPRAZolam 0.25 MG tablet Commonly known as: XANAX   aspirin EC 81 MG tablet   doxylamine (Sleep) 25 MG tablet Commonly known as: UNISOM   hydrochlorothiazide 25 MG tablet Commonly known as: HYDRODIURIL   insulin NPH-regular Human (70-30) 100 UNIT/ML injection   losartan 100 MG tablet Commonly known as: COZAAR   metFORMIN 1000 MG tablet Commonly known as: GLUCOPHAGE     TAKE these medications   acetaminophen 650 MG CR tablet Commonly known as: TYLENOL Take 1,300 mg by mouth every 8 (eight) hours as needed for pain.   apixaban 5 MG Tabs tablet Commonly known as: ELIQUIS Take 1 tablet (5 mg total) by mouth 2 (two) times daily.   famotidine 40 MG tablet Commonly known as: PEPCID Take 40 mg by mouth 2 (two) times daily.   ferrous sulfate 325 (65 FE) MG tablet Take 325 mg by mouth 3 (three) times daily with meals.   HYDROcodone-acetaminophen 5-325 MG tablet Commonly known as: NORCO/VICODIN Take 1 tablet by mouth every 6 (six) hours as needed.   Magnesium Oxide 400 MG Caps Take 1 capsule (400 mg total) by mouth daily.   One-A-Day Womens 50+ Tabs Take 1 tablet by mouth daily.   simvastatin 80 MG tablet Commonly known as: ZOCOR Take 80 mg by mouth at bedtime.       Discharge Assessment: Vitals:   02/08/21 0636 02/08/21 0900  BP: 113/69 112/73  Pulse: 74 76  Resp: 15 16  Temp: 97.8 F (36.6 C) 98 F (36.7 C)  SpO2: 98% 98%   Skin clean, dry and intact without evidence of skin break down, no evidence of skin tears noted. IV catheter discontinued intact. Site  without signs and symptoms of complications - no redness or edema noted at insertion site, patient denies c/o pain - only slight tenderness at site.  Dressing with slight pressure applied.  D/c Instructions-Education: Discharge instructions given to patient/family with verbalized understanding. D/c education completed with patient/family including follow up instructions, medication list, d/c activities limitations if indicated, with other d/c instructions as indicated by MD - patient able to verbalize understanding, all questions fully answered. Patient instructed to return to ED, call 911, or call MD for any changes in condition.  Patient escorted via [place], and D/C home via private auto.  Natassia Guthridge, Jolene Schimke, RN 02/08/2021 2:33 PM

## 2021-02-08 NOTE — Progress Notes (Signed)
Subjective:  Patient denies any chest pain or shortness of breath.  Tolerating Eliquis without problems.  Objective:  Vital Signs in the last 24 hours: Temp:  [97.8 F (36.6 C)-98 F (36.7 C)] 98 F (36.7 C) (03/13 0900) Pulse Rate:  [74-87] 76 (03/13 0900) Resp:  [15-18] 16 (03/13 0900) BP: (102-113)/(69-78) 112/73 (03/13 0900) SpO2:  [98 %-100 %] 98 % (03/13 0900)  Intake/Output from previous day: 03/12 0701 - 03/13 0700 In: 720 [P.O.:720] Out: 325 [Urine:325] Intake/Output from this shift: Total I/O In: 240 [P.O.:240] Out: -   Physical Exam: Exam unchanged  Lab Results: Recent Labs    02/07/21 0323 02/08/21 0610  WBC 4.8 4.6  HGB 9.3* 9.6*  PLT 190 212   Recent Labs    02/07/21 0323 02/08/21 0610  NA 136 137  K 3.9 3.8  CL 106 105  CO2 24 25  GLUCOSE 121* 113*  BUN 15 14  CREATININE 0.84 0.69   No results for input(s): TROPONINI in the last 72 hours.  Invalid input(s): CK, MB Hepatic Function Panel No results for input(s): PROT, ALBUMIN, AST, ALT, ALKPHOS, BILITOT, BILIDIR, IBILI in the last 72 hours. No results for input(s): CHOL in the last 72 hours. No results for input(s): PROTIME in the last 72 hours.  Imaging: Imaging results have been reviewed and No results found.  Cardiac Studies:  Assessment/Plan:  A. Fib with controlled ventricular response chads vasc score of 5 Status post hypoglycemic episode with altered mental status secondary to meds. Moderate aortic stenosis. In the past. Hypertension. Insulin-requiring diabetes mellitus. Hyperlipidemia. Morbid obesity. History of CA of the colon in the past. History of remote tobacco abuse. Degenerative joint disease. History of remote tobacco abuse Chronic anemia stable History of recurrent fall and acute fractures of the right 8 rib History of fall and sixth rib fracture in the past Plan Continue present management I will sign off please call if needed Follow-up with me in 2  weeks  LOS: 4 days    [name] [name] 02/08/2021, 10:46 AM

## 2021-02-08 NOTE — TOC Transition Note (Addendum)
Transition of Care [facility]) - CM/SW Discharge Note   Patient Details  Name: [name] [name] MRN: [id number] Date of Birth: 1946/10/12  Transition of Care [name]) CM/SW Contact:  Carles Collet, RN Phone Number: 02/08/2021, 12:30 PM   Clinical Narrative:    [name] w patient and sister at bedside. Provided Eliquis 30 day card. Discussed [name] providers and medicare ratings. [facility] can accept for [facility] w RN, but PT would be delayed 7 days. Checking w other [facility] agency for earlier start for PT services.  No DME needs.  Update- [name] able to accept with [facility] Tuesday.     Final next level of care: [name] Barriers to Discharge: No Barriers Identified   Patient Goals and CMS Choice Patient states their goals for this hospitalization and ongoing recovery are:: to go home w [name] services CMS Medicare.gov Compare Post Acute Care list provided to:: Patient Choice offered to / list presented to : Patient  Discharge Placement                       Discharge Plan and Services In-house Referral: Clinical Social Work              DME Arranged: N/A         HH Arranged: RN,PT,OT,Social Work          Social Determinants of Health ([place]) Interventions     Readmission Risk Interventions No flowsheet data found.

## 2021-02-09 LAB — CULTURE, BLOOD (ROUTINE X 2)
Culture: NO GROWTH
Culture: NO GROWTH
Special Requests: ADEQUATE
Special Requests: ADEQUATE

## 2021-02-10 NOTE — Discharge Summary (Signed)
[facility] Discharge Summary   Patient: [name] [name]:[id number]  PCP: Chesley Noon, MD  Date of admission: 02/03/2021   Date of discharge: 02/08/2021     Discharge Diagnoses:  Principal Problem:   Hypoglycemia Active Problems:   Hypothermia   Hypotension   Atrial fibrillation, new onset ([place])   AKI (acute kidney injury) ([place])   Atrial fibrillation with rapid ventricular response ([place])  Admitted From: home Disposition:  Home with home health, patient refused to go to SNF  Recommendations for Outpatient Follow-up:  1. PCP: Follow-up with PCP in 1 week.  Follow-up with cardiology as recommended. 2. Follow up LABS/TEST: None 3. New medication: apixaban(ELIQUIS)Magnesium Oxide.   Follow-up Information    Chesley Noon, MD. Schedule an appointment as soon as possible for a visit in 1 week(s).   Specialty: Family Medicine Contact information: [place] [address] [phone]        Charolette Forward, MD. Schedule an appointment as soon as possible for a visit in 1 month(s).   Specialty: Cardiology Contact information: [place] [address] [place], [place] Follow up.   Specialty: Home Health Services Why: For home health services. they will call you  in 1-2 days to schedule an appointment.  Contact information: [place] [address] [phone]              Discharge Instructions    Amb referral to [facility]   Complete by: As directed    Diet Carb Modified   Complete by: As directed    Increase activity slowly   Complete by: As directed       Diet recommendation: Cardiac diet  Activity: The patient is advised to gradually reintroduce usual activities, as tolerated  Discharge Condition: stable  Code Status: Full code   History of present illness: As per the H and P dictated on admission, "[name] [name] is a  75 y.o. female with medical history significant of insulin-dependent type 2 diabetes, hypertension, hyperlipidemia, anxiety, anemia, morbid obesity (BMI 42.07), GERD, sleep apnea presenting to the ED via EMS after she had loss of consciousness at home and was found to be hypoglycemic by EMS with blood glucose of 35.  She was given an amp of dextrose and glucose improved to 200.  She was also found to be in A. fib with rate initially 140-150.  History provided by patient and her sister at bedside.  Sister states patient did not lose consciousness at home.  She was confused and just staring and when EMS checked her blood sugar they found it was low.  Patient states she takes Novolin 70/30 16 units in the morning and 20 units in the evening.  She took her evening insulin around 6 PM in the evening but does report poor appetite during the day.  States she ate only 2 crackers for breakfast, one half sandwich for lunch, and 1 eggroll for dinner.  Denies fevers, cough, shortness of breath, chest pain, nausea, vomiting, abdominal pain, diarrhea, or dysuria.  She is not vaccinated against COVID.  States she had a fall a few days ago but does not exactly remember what happened.  She thinks she had a bad dream and when she woke up she was on the floor next to her bed.  Denies any headaches, neck pain, back pain, or pain anywhere else.  States her  left knee was a little bruised but she was able to get up and has been ambulating since then without any difficulty.  She uses a walker at baseline.  States she has chronic knee problems but has not had any pain since the day she fell.  Patient denies history of A. fib.  States her doctor told her she has a heart murmur.  ED Course: Hypothermic with temperature 94.6 F.  Slightly tachycardic on arrival.  Hypotensive with systolic in the 56Y to 56L.  Not tachypneic or hypoxemic.  Labs showing WBC 6.8, hemoglobin 11.5 (at baseline), platelet count 207K.  Sodium 137, potassium 3.3,  chloride 105, bicarb 21, anion gap 11, BUN 25, creatinine 1.1 (baseline 0.7), glucose 46.  Magnesium 1.5.  SARS-CoV-2 PCR test pending.  Chest x-ray showing no acute abnormality. Patient was given an amp of D50, oral potassium 40 mEq, IV magnesium 2 g, and started on IV heparin."  Hospital Course:  Summary of her active problems in the hospital is as following. Hypoglycemia Type 2 diabetes mellitus, uncontrolled with hypoglycemia with long-term insulin use and HLD. Her home insulin regimen includes Novolin 70/30 16 units in the morning and 20 units in the evening. She was hypoglycemic with CBG in the 30swithEMS. hypoglycemia is due to poor oral intake in setting of insulin use.  Throughout the hospital course patient only required total 6 units of insulin during 5-day. Therefore currently I will be holding her home Metformin, NPH and recommend her to follow-up with PCP with a log of her blood sugars.  Hypothermia Hypotension Hypothermia likely precipitated by severe hypoglycemia. Hypotension likely due to dehydration. No evidence of acute infection.   normal cortisol level and TSH levels.  New onset A. Fib with RVR. Found to be in A. fib withrate 140-150 initially with EMS. Currently rate controlled without receiving any AV nodal blocking agent. CHA2DS2VASc 5and was started on IV heparin for anticoagulation in the ED. Now on lovenox.  Echocardiogram still not available in the chart but per cardiology okay to switch to Eliquis. Appreciate assistance. Discussed with patient regarding risk benefit of DOACs versus Coumadin. Patient would prefer to be on a DOAC.  We gave her 30-day Eliquis coupon on discharge and patient will follow-up with PCP regarding further assistance. Her co-pay for Eliquis is $47.  Mild AKI resolved Likely prerenal from hypotension/dehydration. BUN 25, creatinine 1.1 (baseline 0.7). Treated with IV fluid hydration.  Recurrent fall Patient started on  anticoagulation in the ED for A. Fib. She does report arecent fall,  head CT ruled out intracranial bleed. PT OT recommends SNF. Family wanted to discuss CIR although patient not a good candidate for CIR. Will go home based on her preference.  Mild hypokalemia Hypomagnesemia Corrected. Monitor daily.  Hypertension Cardiology started the patient on Lopressor.  Blood pressure stable.  Hyperlipidemia continuehome statin.  GERD -Continue Pepcid  Chronic anemia Hemoglobin at baseline. -Continue home iron supplement  Obesity Placing the patient at high risk for poor outcome. Patient has OSA not using CPAP secondary to difficulty getting up multiple times at night. Body mass index is 39.68 kg/m.   Patient was seen by physical therapy, who recommended SNF, patient refused to go to SNF and decided to go home with home health. On the day of the discharge the patient's vitals were stable, and no other acute medical condition were reported by patient. The patient was felt safe to be discharge at Home with Home health.  Consultants: Cardiology Procedures: Echocardiogram  DISCHARGE  MEDICATION:  Allergies as of 02/08/2021      Reactions   Codeine Other (See Comments)   headache   Oxycodone-acetaminophen Nausea And Vomiting      Medication List    STOP taking these medications   ALPRAZolam 0.25 MG tablet Commonly known as: XANAX   aspirin EC 81 MG tablet   doxylamine (Sleep) 25 MG tablet Commonly known as: UNISOM   hydrochlorothiazide 25 MG tablet Commonly known as: HYDRODIURIL   insulin NPH-regular Human (70-30) 100 UNIT/ML injection   losartan 100 MG tablet Commonly known as: COZAAR   metFORMIN 1000 MG tablet Commonly known as: GLUCOPHAGE     TAKE these medications   acetaminophen 650 MG CR tablet Commonly known as: TYLENOL Take 1,300 mg by mouth every 8 (eight) hours as needed for pain.   apixaban 5 MG Tabs tablet Commonly known as: ELIQUIS Take  1 tablet (5 mg total) by mouth 2 (two) times daily.   famotidine 40 MG tablet Commonly known as: PEPCID Take 40 mg by mouth 2 (two) times daily.   ferrous sulfate 325 (65 FE) MG tablet Take 325 mg by mouth 3 (three) times daily with meals.   HYDROcodone-acetaminophen 5-325 MG tablet Commonly known as: NORCO/VICODIN Take 1 tablet by mouth every 6 (six) hours as needed.   Magnesium Oxide 400 MG Caps Take 1 capsule (400 mg total) by mouth daily.   One-A-Day Womens 50+ Tabs Take 1 tablet by mouth daily.   simvastatin 80 MG tablet Commonly known as: ZOCOR Take 80 mg by mouth at bedtime.       Discharge Exam: Filed Weights   02/04/21 0030 02/04/21 1523  Weight: 104.3 kg 98.4 kg   Vitals:   02/08/21 0636 02/08/21 0900  BP: 113/69 112/73  Pulse: 74 76  Resp: 15 16  Temp: 97.8 F (36.6 C) 98 F (36.7 C)  SpO2: 98% 98%   General: Appear in no distress, bilateral knee skin injury from prior fall, no Rash; Oral Mucosa Clear, moist. no Abnormal Neck Mass Or lumps, Conjunctiva normal  Cardiovascular: S1 and S2 Present, no Murmur Respiratory: good respiratory effort, Bilateral Air entry present and CTA, no Crackles, no wheezes Abdomen: Bowel Sound present, Soft and no tenderness Extremities: no Pedal edema Neurology: alert and oriented to time, place, and person affect appropriate. no new focal deficit  The results of significant diagnostics from this hospitalization (including imaging, microbiology, ancillary and laboratory) are listed below for reference.    Significant Diagnostic Studies: DG Ribs Unilateral Right  Result Date: 02/04/2021 CLINICAL DATA:  75 year old female with rib contusion, pain. EXAM: RIGHT RIBS - 2 VIEW COMPARISON:  Portable chest.  0050 hours today and earlier FINDINGS: Two oblique views of the right ribs. No right pneumothorax, pleural effusion or confluent pulmonary opacity. Displaced anterior right 8th rib fracture. More chronic appearing anterior  right 6th rib fracture. Osteopenia. Visible shoulder osseous structures appear aligned. No other acute osseous abnormality identified. IMPRESSION: 1. Acute, displaced anterior right 8th rib fracture. No right pneumothorax. 2. More chronic appearing anterior right 6th rib fracture. Electronically Signed   By: Genevie Ann M.D.   On: 02/04/2021 10:46   DG Shoulder Right  Result Date: 02/04/2021 CLINICAL DATA:  Rib contusion.  Shoulder pain. EXAM: RIGHT SHOULDER - 2+ VIEW COMPARISON:  Chest and rib series 02/15/2008. FINDINGS: Diffuse osteopenia. Acromioclavicular and glenohumeral degenerative change. Posterior subluxation of the right shoulder cannot be excluded. No evidence of fracture. No evidence of separation. High-riding right shoulder consistent  with chronic rotator cuff tear. IMPRESSION: 1. Diffuse osteopenia. Acromioclavicular and glenohumeral degenerative change. High-riding shoulder consistent with chronic rotator cuff tear. 2. Posterior subluxation of the right shoulder cannot be excluded. No evidence of fracture or separation. Electronically Signed   By: Marcello Moores  Register   On: 02/04/2021 10:47   CT HEAD WO CONTRAST  Result Date: 02/04/2021 CLINICAL DATA:  75 year old female status post loss of consciousness. Hypoglycemic (blood glucose of 35). Atrial fibrillation on anticoagulation. Recent fall. EXAM: CT HEAD WITHOUT CONTRAST TECHNIQUE: Contiguous axial images were obtained from the base of the skull through the vertex without intravenous contrast. COMPARISON:  None. FINDINGS: Brain: Cerebral volume is within normal limits for age. No midline shift, ventriculomegaly, mass effect, evidence of mass lesion, intracranial hemorrhage or evidence of cortically based acute infarction. Gray-white matter differentiation is within normal limits throughout the brain. Vascular: Calcified atherosclerosis at the skull base. No suspicious intracranial vascular hyperdensity. Skull: Intact, negative. Sinuses/Orbits:  Scattered mild paranasal sinus mucosal thickening. No sinus fluid levels. Tympanic cavities and mastoids are clear. Other: No orbit or scalp soft tissue injury identified. IMPRESSION: No acute traumatic injury identified. Normal for age non contrast CT appearance of the brain. Electronically Signed   By: Genevie Ann M.D.   On: 02/04/2021 06:20   DG CHEST PORT 1 VIEW  Result Date: 02/06/2021 CLINICAL DATA:  Shortness of breath.  Hypertension EXAM: PORTABLE CHEST 1 VIEW COMPARISON:  February 04, 2021 chest radiograph and rib images FINDINGS: Lungs are clear. Heart is mildly enlarged with pulmonary vascularity normal. Aorta is prominent with aortic atherosclerosis. No adenopathy. Recent fracture right posterior eighth rib noted. IMPRESSION: Lungs clear. Stable cardiac silhouette. Thoracic aortic prominence is likely due to chronic hypertension. Recent fracture right eighth rib posteriorly common noted on recent rib series. Aortic Atherosclerosis (ICD10-I70.0). Electronically Signed   By: Lowella Grip III M.D.   On: 02/06/2021 11:04   DG Chest Port 1 View  Result Date: 02/04/2021 CLINICAL DATA:  Atrial fibrillation with altered level consciousness EXAM: PORTABLE CHEST 1 VIEW COMPARISON:  12/17/2010 FINDINGS: Cardiac shadow is within normal limits. Tortuous thoracic aorta is noted. Lungs are well aerated bilaterally. No focal infiltrate or sizable effusion is seen. No bony abnormality is noted. IMPRESSION: No acute abnormality noted. Electronically Signed   By: Inez Catalina M.D.   On: 02/04/2021 00:56    Microbiology: Recent Results (from the past 240 hour(s))  SARS CORONAVIRUS 2 (TAT 6-24 HRS) Nasopharyngeal Nasopharyngeal Swab     Status: None   Collection Time: 02/04/21  3:37 AM   Specimen: Nasopharyngeal Swab  Result Value Ref Range Status   SARS Coronavirus 2 NEGATIVE NEGATIVE Final    Comment: (NOTE) SARS-CoV-2 target nucleic acids are NOT DETECTED.  The SARS-CoV-2 RNA is generally detectable in  upper and lower respiratory specimens during the acute phase of infection. Negative results do not preclude SARS-CoV-2 infection, do not rule out co-infections with other pathogens, and should not be used as the sole basis for treatment or other patient management decisions. Negative results must be combined with clinical observations, patient history, and epidemiological information. The expected result is Negative.  Fact Sheet for Patients: SugarRoll.be  Fact Sheet for Healthcare Providers: https://www.woods-mathews.com/  This test is not yet approved or cleared by the [place] FDA and  has been authorized for detection and/or diagnosis of SARS-CoV-2 by FDA under an Emergency Use Authorization (EUA). This EUA will remain  in effect (meaning this test can be used) for the duration of  the COVID-19 declaration under Se ction 564(b)(1) of the Act, 21 U.S.C. section 360bbb-3(b)(1), unless the authorization is terminated or revoked sooner.  Performed at [facility], [place] [address] [place], [place] [address]   Culture, blood (routine x 2)     Status: None   Collection Time: 02/04/21  5:00 AM   Specimen: BLOOD  Result Value Ref Range Status   Specimen Description BLOOD SITE NOT SPECIFIED  Final   Special Requests   Final    BOTTLES DRAWN AEROBIC AND ANAEROBIC Blood Culture adequate volume   Culture   Final    NO GROWTH 5 DAYS Performed at [facility], [address], [place], [place] [address]    Report Status 02/09/2021 FINAL  Final  Culture, blood (routine x 2)     Status: None   Collection Time: 02/04/21  5:30 AM   Specimen: BLOOD  Result Value Ref Range Status   Specimen Description BLOOD SITE NOT SPECIFIED  Final   Special Requests   Final    BOTTLES DRAWN AEROBIC AND ANAEROBIC Blood Culture adequate volume   Culture   Final    NO GROWTH 5 DAYS Performed at [facility], [place] [address], [place],  [place] [address]    Report Status 02/09/2021 FINAL  Final     Labs: CBC: Recent Labs  Lab 02/04/21 0031 02/05/21 0321 02/05/21 1023 02/06/21 0351 02/07/21 0323 02/08/21 0610  WBC 6.8 6.6 6.8 5.8 4.8 4.6  NEUTROABS 3.9  --   --   --   --   --   HGB 11.5* 9.8* 10.8* 10.1* 9.3* 9.6*  HCT 36.0 30.4* 32.4* 29.8* 28.8* 28.5*  MCV 96.0 93.8 92.3 92.3 94.4 93.4  PLT 207 215 237 227 190 349   Basic Metabolic Panel: Recent Labs  Lab 02/04/21 0031 02/04/21 0755 02/05/21 1023 02/07/21 0323 02/08/21 0610  NA 137 135 133* 136 137  K 3.3* 3.9 4.2 3.9 3.8  CL 105 105 103 106 105  CO2 21* 20* 23 24 25   GLUCOSE 46* 81 274* 121* 113*  BUN 25* 22 16 15 14   CREATININE 1.11* 0.91 0.94 0.84 0.69  CALCIUM 8.6* 8.1* 8.6* 8.5* 8.7*  MG 1.5* 1.8 1.5* 1.6* 1.5*   Liver Function Tests: Recent Labs  Lab 02/04/21 0031  AST 61*  ALT 38  ALKPHOS 48  BILITOT 0.8  PROT 5.8*  ALBUMIN 3.0*   CBG: Recent Labs  Lab 02/07/21 1145 02/07/21 1644 02/07/21 2135 02/08/21 0637 02/08/21 1132  GLUCAP 139* 150* 127* 114* 144*    Time spent: 35 minutes  Signed:  Berle Mull  [facility] 02/08/2021 8:19 AM

## 2021-02-19 ENCOUNTER — Emergency Department (HOSPITAL_COMMUNITY): Payer: Medicare HMO

## 2021-02-19 ENCOUNTER — Emergency Department (HOSPITAL_BASED_OUTPATIENT_CLINIC_OR_DEPARTMENT_OTHER): Payer: Medicare HMO

## 2021-02-19 ENCOUNTER — Encounter (HOSPITAL_COMMUNITY): Payer: Self-pay

## 2021-02-19 ENCOUNTER — Emergency Department (HOSPITAL_COMMUNITY)
Admission: EM | Admit: 2021-02-19 | Discharge: 2021-02-19 | Disposition: A | Discharge status: Home / Self Care | Payer: Medicare HMO | Attending: Emergency Medicine | Admitting: Emergency Medicine

## 2021-02-19 DIAGNOSIS — E119 Type 2 diabetes mellitus without complications: Secondary | ICD-10-CM | POA: Insufficient documentation

## 2021-02-19 DIAGNOSIS — I10 Essential (primary) hypertension: Secondary | ICD-10-CM | POA: Diagnosis not present

## 2021-02-19 DIAGNOSIS — Z87891 Personal history of nicotine dependence: Secondary | ICD-10-CM | POA: Diagnosis not present

## 2021-02-19 DIAGNOSIS — R609 Edema, unspecified: Secondary | ICD-10-CM | POA: Diagnosis not present

## 2021-02-19 DIAGNOSIS — Z79899 Other long term (current) drug therapy: Secondary | ICD-10-CM | POA: Insufficient documentation

## 2021-02-19 DIAGNOSIS — Z85038 Personal history of other malignant neoplasm of large intestine: Secondary | ICD-10-CM | POA: Insufficient documentation

## 2021-02-19 DIAGNOSIS — Z7901 Long term (current) use of anticoagulants: Secondary | ICD-10-CM | POA: Insufficient documentation

## 2021-02-19 DIAGNOSIS — M79604 Pain in right leg: Secondary | ICD-10-CM

## 2021-02-19 DIAGNOSIS — Z96642 Presence of left artificial hip joint: Secondary | ICD-10-CM | POA: Diagnosis not present

## 2021-02-19 IMAGING — DX DG HIP (WITH OR WITHOUT PELVIS) 2-3V*R*
3 series · 3 of 3 positions shown · non-contrast
Comparison: CT right hip and right hip x-rays dated [DATE].

CLINICAL DATA: Right hip pain.

EXAM:
DG HIP (WITH OR WITHOUT PELVIS) 2-3V RIGHT

[t pelvis ap]
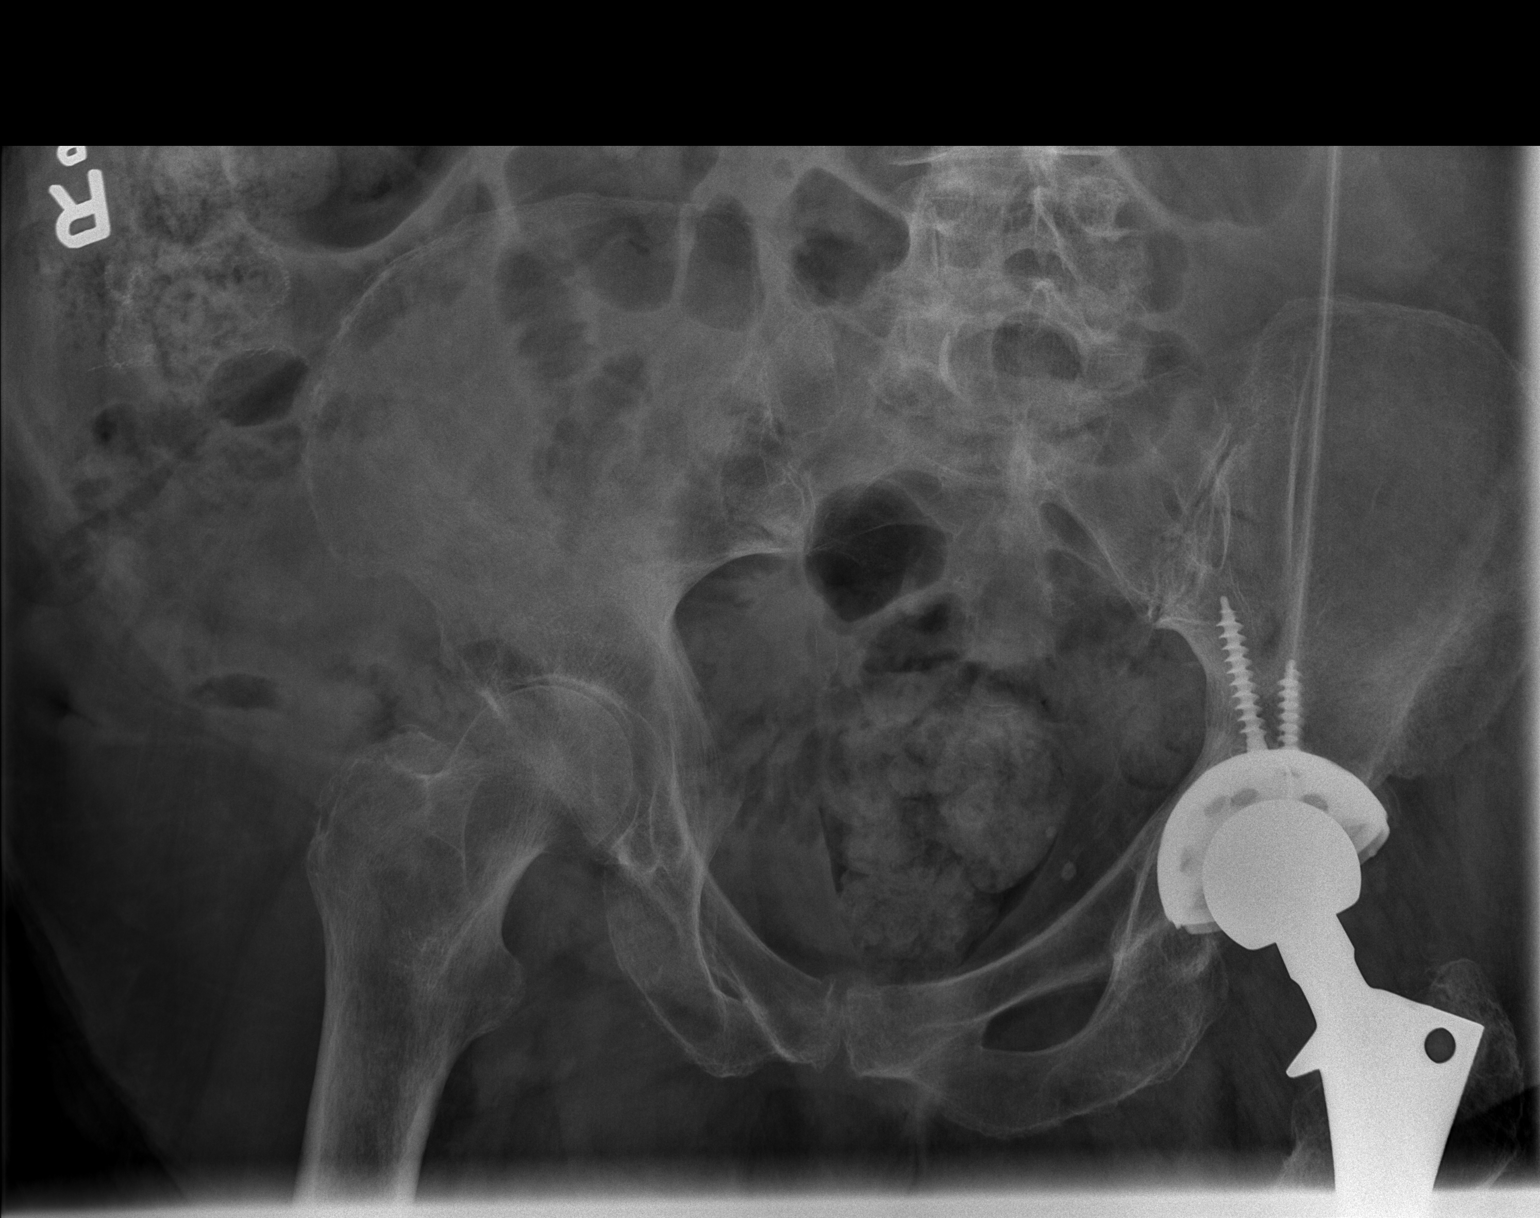

[t hip ap right]
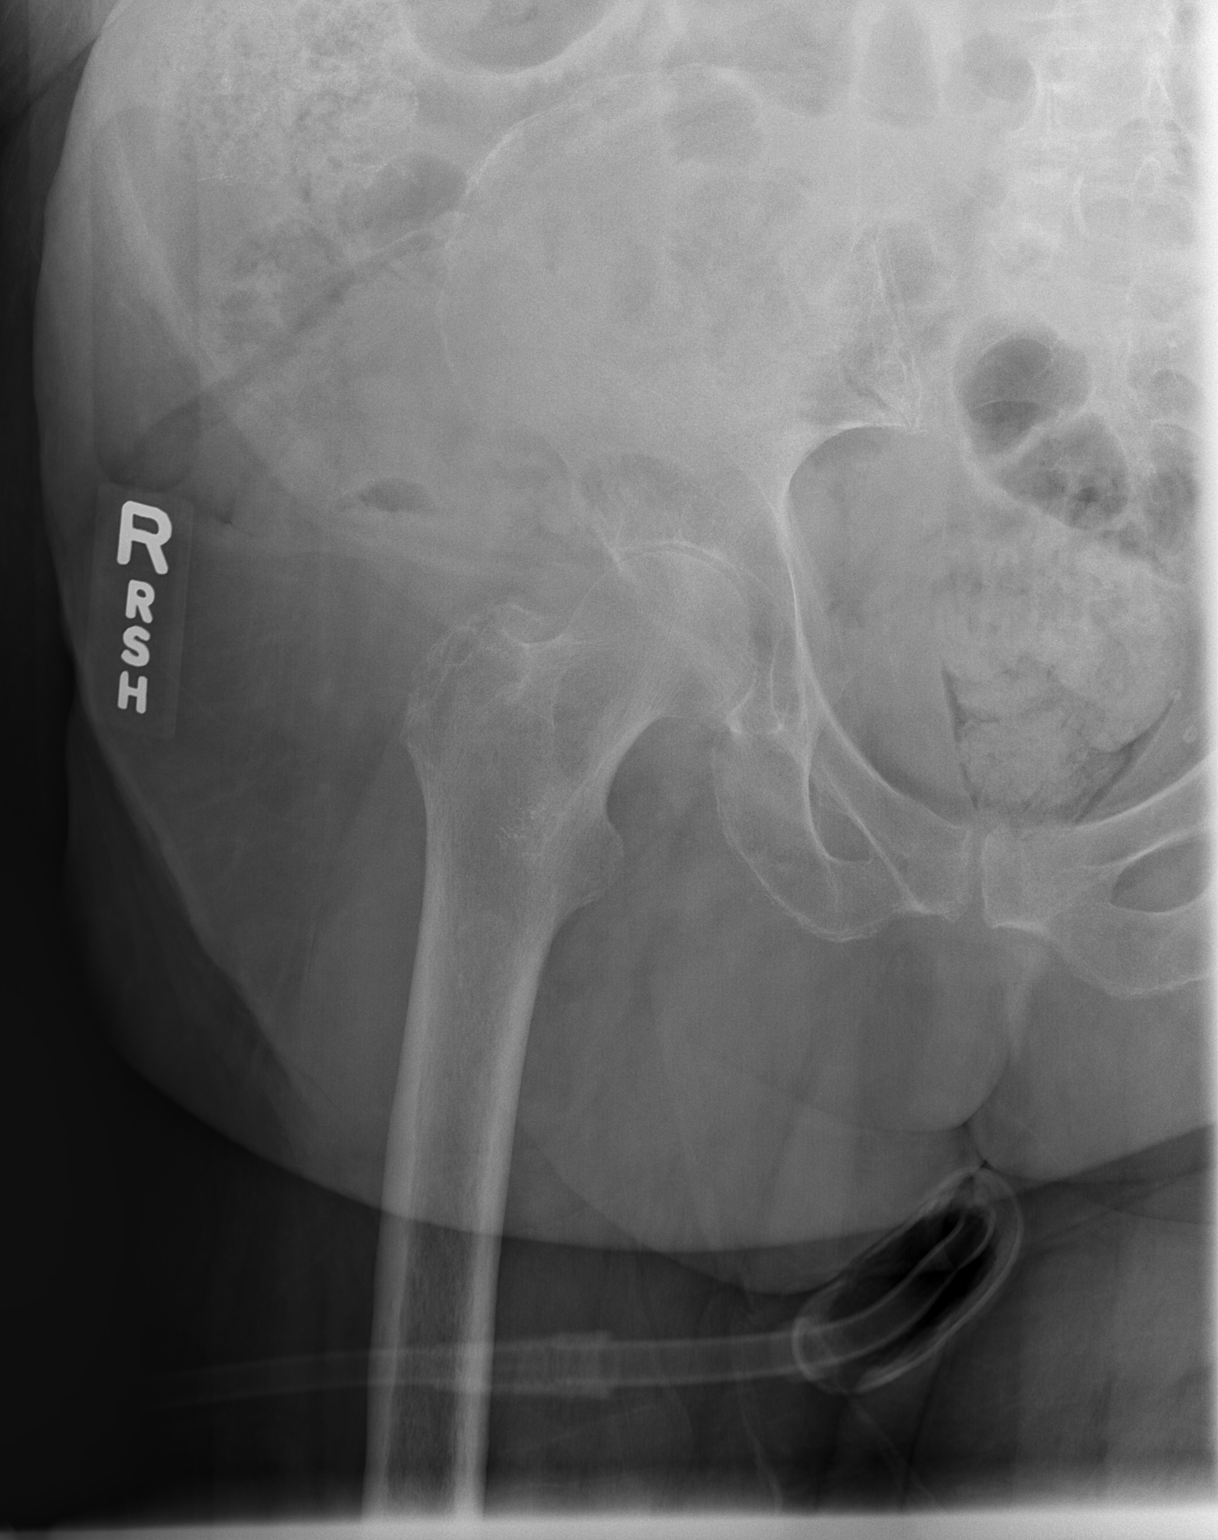

[t hip frog leg right]
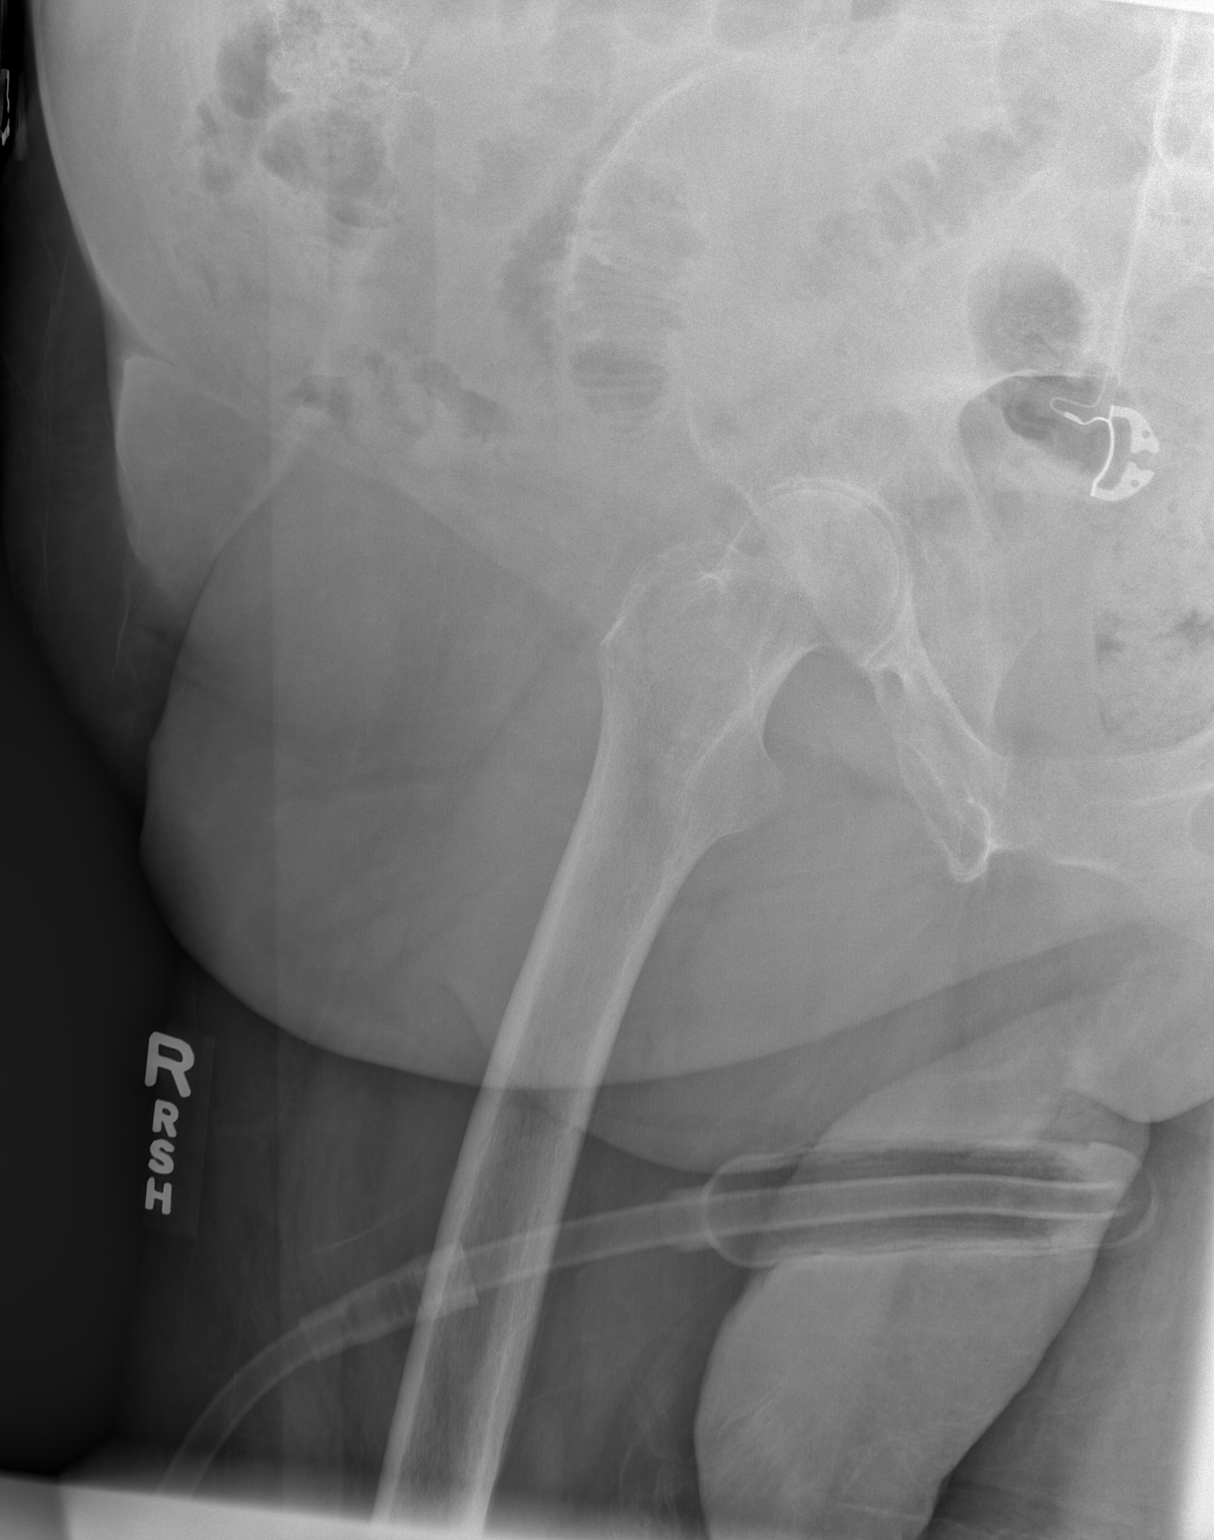

[3 of 3 positions shown; findings below may reference images not displayed]

FINDINGS: No acute fracture or dislocation. Unchanged mild right hip joint
space narrowing. Prior left total hip arthroplasty. Soft tissues are
unremarkable. Postsurgical changes in the right lower quadrant.
IMPRESSION: 1. No acute osseous abnormality.
2. Unchanged mild right hip osteoarthritis.

## 2021-02-19 MED ORDER — METHOCARBAMOL 500 MG PO TABS: 500.0000 mg | ORAL_TABLET | Freq: Three times a day (TID) | ORAL | 0 refills | Status: DC | PRN

## 2021-02-19 NOTE — ED Provider Notes (Signed)
[facility] EMERGENCY DEPARTMENT Provider Note   CSN: [id number] Arrival date & time: 02/19/21  0973     History Chief Complaint  Patient presents with  . Leg Pain    [name] [name] is a 75 y.o. female.  HPI Patient presents with right thigh/groin pain.  States she has chronic pain in her knee and ankle but this is a little higher up.  States she recently started physical therapy and states that the pain got worse after that.  Worse with movement.  No fevers or chills.  Does have some swelling in the leg but reported leg has a history of swelling.  The right leg is often more swollen than the left.  States she does not think she has been ever looked at for a blood clot in that leg.  She is on anticoagulation for A. fib.  That however to started a couple weeks ago.  Did have a fall around 3 weeks ago but was not hurting in the hip at that time.  No rash.  No fevers or chills.  Patient states the oxycodone she is taking does not really help.    Past Medical History:  Diagnosis Date  . Anemia   . Arthritis   . Cancer [facility]) 2012   colon  . Diabetes mellitus without complication ([place])   . GERD (gastroesophageal reflux disease)   . Headache   . Hypertension   . Sleep apnea    has cpap, does not use cpap on regular basis    Patient Active Problem List   Diagnosis Date Noted  . Hypoglycemia 02/04/2021  . Hypothermia 02/04/2021  . Hypotension 02/04/2021  . Atrial fibrillation, new onset ([place]) 02/04/2021  . AKI (acute kidney injury) ([place]) 02/04/2021  . Atrial fibrillation with rapid ventricular response ([place]) 02/04/2021  . HYPERLIPIDEMIA-MIXED 12/18/2010  . HYPERTENSION, BENIGN 12/18/2010  . SHORTNESS OF BREATH 12/18/2010  . OBSTRUCTIVE SLEEP APNEA 01/11/2008    Past Surgical History:  Procedure Laterality Date  . ABDOMINAL HYSTERECTOMY     partial  . BREAST BIOPSY Bilateral pt unsure   benign  . COLON SURGERY  2012   no chemo or radiation done  .  COLONOSCOPY WITH PROPOFOL N/A 01/30/2015   Procedure: COLONOSCOPY WITH PROPOFOL;  Surgeon: Juanita Craver, MD;  Location: [facility] ENDOSCOPY;  Service: Endoscopy;  Laterality: N/A;  . JOINT REPLACEMENT     left hip replacement x 2  . left wrist surgery for fracture     . right hand cortisone injection     cast put on     OB History   No obstetric history on file.     History reviewed. No pertinent family history.  Social History   Tobacco Use  . Smoking status: Former Smoker    Packs/day: 0.25    Years: 10.00    Pack years: 2.50    Types: Cigarettes    Quit date: 11/29/1964    Years since quitting: 56.2  . Smokeless tobacco: Never Used  Vaping Use  . Vaping Use: Never used  Substance Use Topics  . Alcohol use: No  . Drug use: No    Home Medications Prior to Admission medications   Medication Sig Start Date End Date Taking? Authorizing Provider  methocarbamol (ROBAXIN) 500 MG tablet Take 1 tablet (500 mg total) by mouth every 8 (eight) hours as needed for muscle spasms. 02/19/21  Yes Davonna Belling, MD  acetaminophen (TYLENOL) 650 MG CR tablet Take 1,300 mg by mouth  every 8 (eight) hours as needed for pain.    [provider]  apixaban (ELIQUIS) 5 MG TABS tablet Take 1 tablet (5 mg total) by mouth 2 (two) times daily. 02/08/21   Lavina Hamman, MD  famotidine (PEPCID) 40 MG tablet Take 40 mg by mouth 2 (two) times daily. 11/16/14   [provider]  ferrous sulfate 325 (65 FE) MG tablet Take 325 mg by mouth 3 (three) times daily with meals.    [provider]  HYDROcodone-acetaminophen (NORCO/VICODIN) 5-325 MG tablet Take 1 tablet by mouth every 6 (six) hours as needed. 10/30/20   Drenda Freeze, MD  Magnesium Oxide 400 MG CAPS Take 1 capsule (400 mg total) by mouth daily. 02/08/21   Lavina Hamman, MD  Multiple Vitamins-Minerals (ONE-A-DAY WOMENS 50+) TABS Take 1 tablet by mouth daily.    [provider]  simvastatin (ZOCOR) 80 MG tablet Take 80  mg by mouth at bedtime. 11/13/14   [provider]    Allergies    Codeine and Oxycodone-acetaminophen  Review of Systems   Review of Systems  Constitutional: Negative for appetite change, fatigue and fever.  HENT: Negative for congestion.   Respiratory: Positive for shortness of breath.   Cardiovascular: Positive for leg swelling.  Gastrointestinal: Negative for abdominal pain.  Musculoskeletal: Positive for gait problem. Negative for joint swelling.  Skin: Negative for rash.  Neurological: Positive for weakness.       Patient feels somewhat weak all over.  This is really unchanged.  Psychiatric/Behavioral: Negative for confusion.    Physical Exam Updated Vital Signs BP (!) 148/82   Pulse (!) 101   Temp 98.3 F (36.8 C) (Oral)   Resp 19   Ht 5\' 5"  (1.651 m)   Wt 107.5 kg   SpO2 95%   BMI 39.44 kg/m   Physical Exam Vitals and nursing note reviewed.  Constitutional:      Appearance: She is obese.  HENT:     Head: Normocephalic.  Eyes:     General: No scleral icterus.    Pupils: Pupils are equal, round, and reactive to light.  Cardiovascular:     Rate and Rhythm: Normal rate.  Pulmonary:     Breath sounds: No wheezing or rhonchi.  Abdominal:     Tenderness: There is no abdominal tenderness.  Musculoskeletal:     Cervical back: Neck supple.     Comments: Pitting edema bilateral lower extremities worse on the right.  Edema goes up to her thigh.  No real tenderness over the hip.  Some mild pain with movement of the lower extremity but pain is more in the knee which is chronic for the patient.  Neurological:     Mental Status: She is alert.     ED Results / Procedures / Treatments   Labs (all labs ordered are listed, but only abnormal results are displayed) Labs Reviewed - No data to display  EKG None  Radiology DG Hip Unilat W or Wo Pelvis 2-3 Views Right  Result Date: 02/19/2021 CLINICAL DATA:  Right hip pain. EXAM: DG HIP (WITH OR WITHOUT  PELVIS) 2-3V RIGHT COMPARISON:  CT right hip and right hip x-rays dated July 29, 2018. FINDINGS: No acute fracture or dislocation. Unchanged mild right hip joint space narrowing. Prior left total hip arthroplasty. Soft tissues are unremarkable. Postsurgical changes in the right lower quadrant. IMPRESSION: 1. No acute osseous abnormality. 2. Unchanged mild right hip osteoarthritis. Electronically Signed   By: Huntley Dec  Derry M.D.   On: 02/19/2021 10:54   VAS [place] LOWER EXTREMITY VENOUS (DVT) (ONLY [facility] & [facility])  Result Date: 02/19/2021  Lower Venous DVT Study Indications: Pain.  Risk Factors: Cancer Colon. Limitations: Body habitus and unable to move legs. Comparison Study: Prev 12/21 Negative limited right leg. Performing Technologist: Vonzell Schlatter RVT  Examination Guidelines: A complete evaluation includes B-mode imaging, spectral Doppler, color Doppler, and power Doppler as needed of all accessible portions of each vessel. Bilateral testing is considered an integral part of a complete examination. Limited examinations for reoccurring indications may be performed as noted. The reflux portion of the exam is performed with the patient in reverse Trendelenburg.  +---------+---------------+---------+-----------+----------+-------------------+ RIGHT    CompressibilityPhasicitySpontaneityPropertiesThrombus Aging      +---------+---------------+---------+-----------+----------+-------------------+ CFV      Full           Yes      Yes                                      +---------+---------------+---------+-----------+----------+-------------------+ SFJ      Full                                                             +---------+---------------+---------+-----------+----------+-------------------+ FV Prox  Full                                                             +---------+---------------+---------+-----------+----------+-------------------+ FV Mid   Full                                                              +---------+---------------+---------+-----------+----------+-------------------+ FV DistalFull                                                             +---------+---------------+---------+-----------+----------+-------------------+ PFV      Full                                                             +---------+---------------+---------+-----------+----------+-------------------+ POP      Full           Yes      Yes                                      +---------+---------------+---------+-----------+----------+-------------------+ PTV      Full                                                             +---------+---------------+---------+-----------+----------+-------------------+  PERO                                                  Not well visualized +---------+---------------+---------+-----------+----------+-------------------+   Right Technical Findings: Not visualized segments include Peroneal.  Summary: RIGHT: - There is no evidence of deep vein thrombosis in the lower extremity. However, portions of this examination were limited- see technologist comments above. - There is no evidence of superficial venous thrombosis.  - No cystic structure found in the popliteal fossa.   *See table(s) above for measurements and observations.    Preliminary     Procedures Procedures   Medications Ordered in ED Medications - No data to display  ED Course  I have reviewed the triage vital signs and the nursing notes.  Pertinent labs & imaging results that were available during my care of the patient were reviewed by me and considered in my medical decision making (see chart for details).    MDM Rules/Calculators/A&P                         Patient with right hip/thigh pain.  History of some chronic pain in his leg.  Reviewed records and recent blood work from few days ago.  Do not appear to need more blood work at this time.   X-ray done due to fall a few weeks ago but did not have imaging of the hip or pelvis at that time.  X-rays reassuring.  Ultrasound done due to pain in the leg and general immobility.  No DVT on the ultrasound.  Pain increased after physical therapy and likely a musculoskeletal pain due to the physical activity.  We will add muscle relaxer but could use this versus the pain medicine.  At have extreme caution using both together.  Reviewing records appears the patient had been recommended for skilled nursing but was not willing to go there previously.    Final Clinical Impression(s) / ED Diagnoses Final diagnoses:  Right leg pain    Rx / DC Orders ED Discharge Orders         Ordered    methocarbamol (ROBAXIN) 500 MG tablet  Every 8 hours PRN        02/19/21 1143           Davonna Belling, MD 02/19/21 1146

## 2021-02-19 NOTE — ED Triage Notes (Signed)
Seen here 3 weeks ago after a fall. DC 2 weeks ago. PT saw her yesterday. Pt reports right leg and knee pain with swelling. No obvious deformity. Ambulatory using a walker.

## 2021-02-19 NOTE — Progress Notes (Signed)
Right lower extremity venous study completed.     Please see CV Proc for preliminary results.   Helvi Royals, RVT  

## 2021-02-19 NOTE — Discharge Instructions (Addendum)
Follow-up with your doctor for the pain.  You may try with the muscle relaxer instead of the pain medicine to see if it will help.  I would be very careful if you are taking both of them together.

## 2021-03-25 ENCOUNTER — Other Ambulatory Visit: Payer: Self-pay | Admitting: Physician Assistant

## 2021-03-25 DIAGNOSIS — E2839 Other primary ovarian failure: Secondary | ICD-10-CM

## 2021-04-09 ENCOUNTER — Other Ambulatory Visit: Payer: Self-pay

## 2021-04-09 ENCOUNTER — Inpatient Hospital Stay (HOSPITAL_COMMUNITY)
Admission: EM | Admit: 2021-04-09 | Discharge: 2021-04-20 | DRG: 556 | Disposition: A | Discharge status: Skilled Nursing Facility | Payer: Medicare HMO | Attending: Internal Medicine | Admitting: Internal Medicine

## 2021-04-09 ENCOUNTER — Observation Stay (HOSPITAL_COMMUNITY): Payer: Medicare HMO

## 2021-04-09 DIAGNOSIS — G4733 Obstructive sleep apnea (adult) (pediatric): Secondary | ICD-10-CM | POA: Diagnosis present

## 2021-04-09 DIAGNOSIS — E1169 Type 2 diabetes mellitus with other specified complication: Secondary | ICD-10-CM

## 2021-04-09 DIAGNOSIS — Z96642 Presence of left artificial hip joint: Secondary | ICD-10-CM | POA: Diagnosis present

## 2021-04-09 DIAGNOSIS — R52 Pain, unspecified: Secondary | ICD-10-CM

## 2021-04-09 DIAGNOSIS — E876 Hypokalemia: Secondary | ICD-10-CM | POA: Diagnosis not present

## 2021-04-09 DIAGNOSIS — I4891 Unspecified atrial fibrillation: Secondary | ICD-10-CM

## 2021-04-09 DIAGNOSIS — I10 Essential (primary) hypertension: Secondary | ICD-10-CM | POA: Diagnosis present

## 2021-04-09 DIAGNOSIS — R627 Adult failure to thrive: Secondary | ICD-10-CM | POA: Diagnosis present

## 2021-04-09 DIAGNOSIS — G8929 Other chronic pain: Secondary | ICD-10-CM | POA: Diagnosis present

## 2021-04-09 DIAGNOSIS — M25559 Pain in unspecified hip: Secondary | ICD-10-CM | POA: Diagnosis present

## 2021-04-09 DIAGNOSIS — E669 Obesity, unspecified: Secondary | ICD-10-CM | POA: Diagnosis present

## 2021-04-09 DIAGNOSIS — Z20822 Contact with and (suspected) exposure to covid-19: Secondary | ICD-10-CM | POA: Diagnosis present

## 2021-04-09 DIAGNOSIS — Z6837 Body mass index (BMI) 37.0-37.9, adult: Secondary | ICD-10-CM

## 2021-04-09 DIAGNOSIS — R112 Nausea with vomiting, unspecified: Secondary | ICD-10-CM | POA: Diagnosis not present

## 2021-04-09 DIAGNOSIS — R262 Difficulty in walking, not elsewhere classified: Secondary | ICD-10-CM | POA: Diagnosis present

## 2021-04-09 DIAGNOSIS — M25551 Pain in right hip: Principal | ICD-10-CM | POA: Diagnosis present

## 2021-04-09 DIAGNOSIS — K219 Gastro-esophageal reflux disease without esophagitis: Secondary | ICD-10-CM | POA: Diagnosis present

## 2021-04-09 DIAGNOSIS — E785 Hyperlipidemia, unspecified: Secondary | ICD-10-CM | POA: Diagnosis present

## 2021-04-09 DIAGNOSIS — Z885 Allergy status to narcotic agent status: Secondary | ICD-10-CM

## 2021-04-09 DIAGNOSIS — Z66 Do not resuscitate: Secondary | ICD-10-CM | POA: Diagnosis present

## 2021-04-09 DIAGNOSIS — F419 Anxiety disorder, unspecified: Secondary | ICD-10-CM | POA: Diagnosis present

## 2021-04-09 DIAGNOSIS — E782 Mixed hyperlipidemia: Secondary | ICD-10-CM | POA: Diagnosis present

## 2021-04-09 DIAGNOSIS — E119 Type 2 diabetes mellitus without complications: Secondary | ICD-10-CM

## 2021-04-09 DIAGNOSIS — Z888 Allergy status to other drugs, medicaments and biological substances status: Secondary | ICD-10-CM

## 2021-04-09 DIAGNOSIS — Z7901 Long term (current) use of anticoagulants: Secondary | ICD-10-CM

## 2021-04-09 DIAGNOSIS — Z79899 Other long term (current) drug therapy: Secondary | ICD-10-CM

## 2021-04-09 DIAGNOSIS — I4821 Permanent atrial fibrillation: Secondary | ICD-10-CM | POA: Diagnosis present

## 2021-04-09 DIAGNOSIS — Z90710 Acquired absence of both cervix and uterus: Secondary | ICD-10-CM

## 2021-04-09 DIAGNOSIS — Z87891 Personal history of nicotine dependence: Secondary | ICD-10-CM

## 2021-04-09 LAB — URINALYSIS, ROUTINE W REFLEX MICROSCOPIC
Bilirubin Urine: NEGATIVE
Glucose, UA: NEGATIVE mg/dL
Hgb urine dipstick: NEGATIVE
Ketones, ur: 5 mg/dL — AB
Leukocytes,Ua: NEGATIVE
Nitrite: NEGATIVE
Protein, ur: NEGATIVE mg/dL
Specific Gravity, Urine: 1.008 (ref 1.005–1.030)
pH: 7 (ref 5.0–8.0)

## 2021-04-09 LAB — CBC WITH DIFFERENTIAL/PLATELET
Abs Immature Granulocytes: 0 10*3/uL (ref 0.00–0.07)
Basophils Absolute: 0 10*3/uL (ref 0.0–0.1)
Basophils Relative: 1 %
Eosinophils Absolute: 0.1 10*3/uL (ref 0.0–0.5)
Eosinophils Relative: 1 %
HCT: 33.8 % — ABNORMAL LOW (ref 36.0–46.0)
Hemoglobin: 11 g/dL — ABNORMAL LOW (ref 12.0–15.0)
Immature Granulocytes: 0 %
Lymphocytes Relative: 44 %
Lymphs Abs: 1.7 10*3/uL (ref 0.7–4.0)
MCH: 30.2 pg (ref 26.0–34.0)
MCHC: 32.5 g/dL (ref 30.0–36.0)
MCV: 92.9 fL (ref 80.0–100.0)
Monocytes Absolute: 0.4 10*3/uL (ref 0.1–1.0)
Monocytes Relative: 10 %
Neutro Abs: 1.7 10*3/uL (ref 1.7–7.7)
Neutrophils Relative %: 44 %
Platelets: 202 10*3/uL (ref 150–400)
RBC: 3.64 MIL/uL — ABNORMAL LOW (ref 3.87–5.11)
RDW: 13.2 % (ref 11.5–15.5)
WBC: 3.9 10*3/uL — ABNORMAL LOW (ref 4.0–10.5)
nRBC: 0 % (ref 0.0–0.2)

## 2021-04-09 LAB — BASIC METABOLIC PANEL
Anion gap: 7 (ref 5–15)
BUN: 11 mg/dL (ref 8–23)
CO2: 26 mmol/L (ref 22–32)
Calcium: 8.6 mg/dL — ABNORMAL LOW (ref 8.9–10.3)
Chloride: 103 mmol/L (ref 98–111)
Creatinine, Ser: 0.72 mg/dL (ref 0.44–1.00)
GFR, Estimated: >60 mL/min (ref >60)
Glucose, Bld: 82 mg/dL (ref 70–99)
Potassium: 2.9 mmol/L — ABNORMAL LOW (ref 3.5–5.1)
Sodium: 136 mmol/L (ref 135–145)

## 2021-04-09 LAB — BRAIN NATRIURETIC PEPTIDE: B Natriuretic Peptide: 129 pg/mL — ABNORMAL HIGH (ref 0.0–100.0)

## 2021-04-09 IMAGING — DX DG HIP (WITH OR WITHOUT PELVIS) 2-3V*R*
5 series · 5 of 5 positions shown · non-contrast
Comparison: [DATE]

CLINICAL DATA: Fall

EXAM:
DG HIP (WITH OR WITHOUT PELVIS) 2-3V RIGHT

[pelvis ap (1 of 4)]
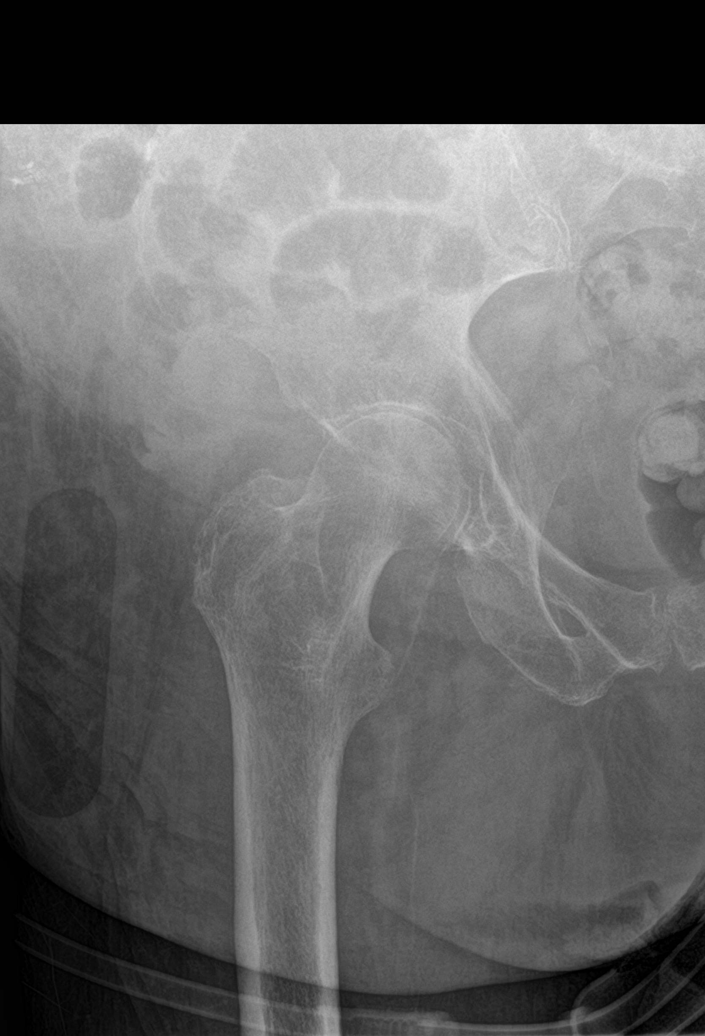

[hip lat]
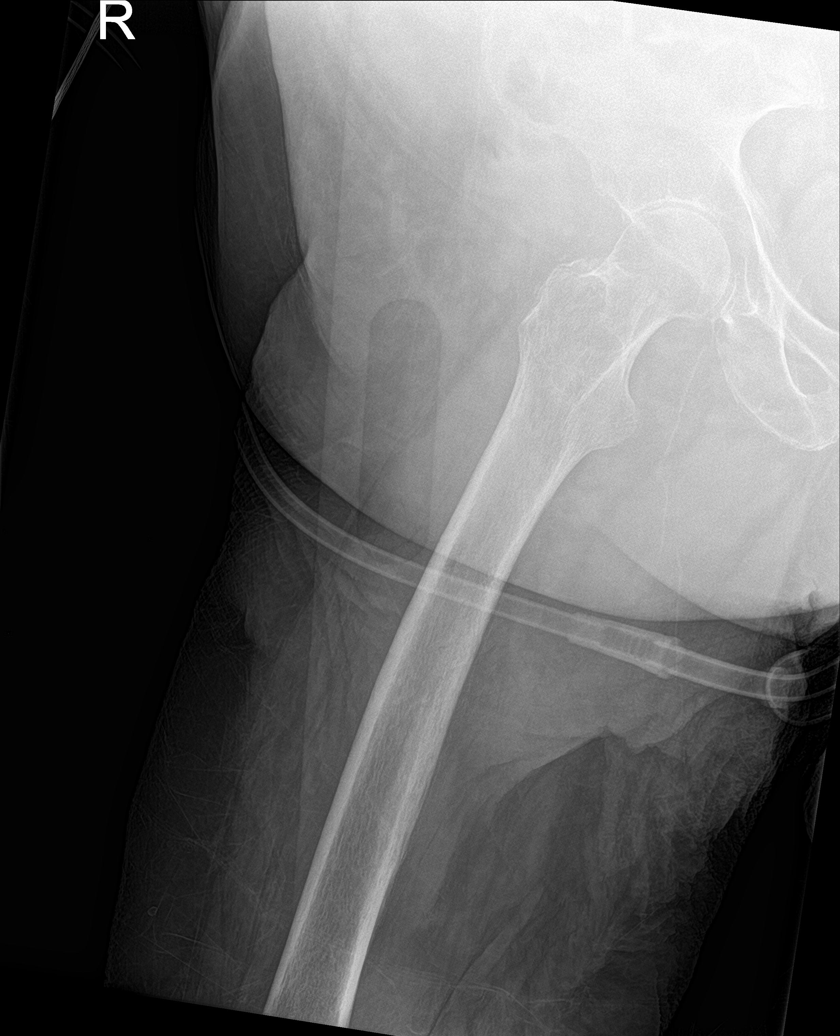

[pelvis ap (2 of 4)]
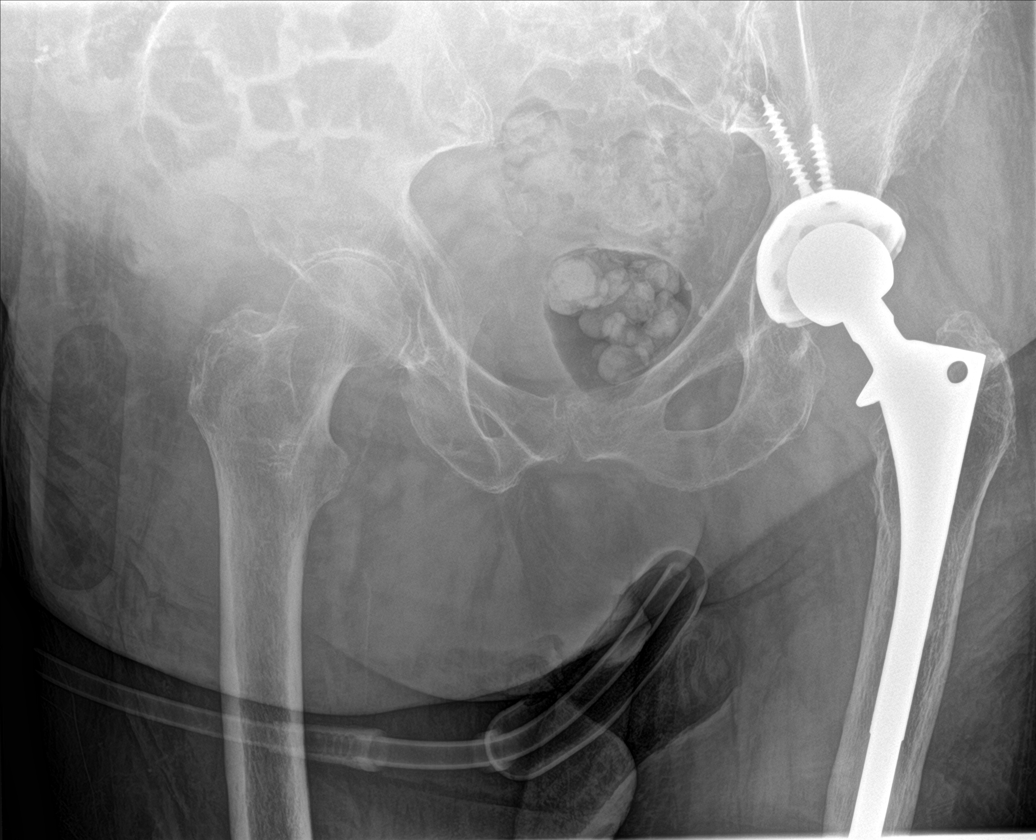

[pelvis ap (3 of 4)]
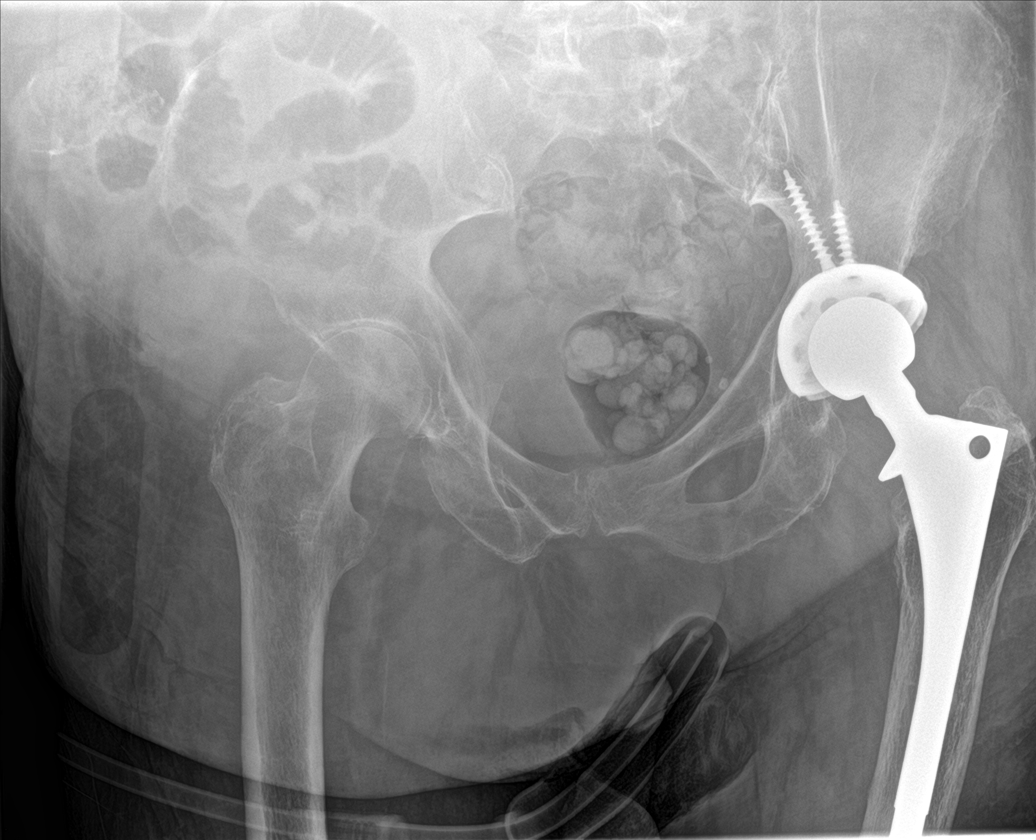

[pelvis ap (4 of 4)]
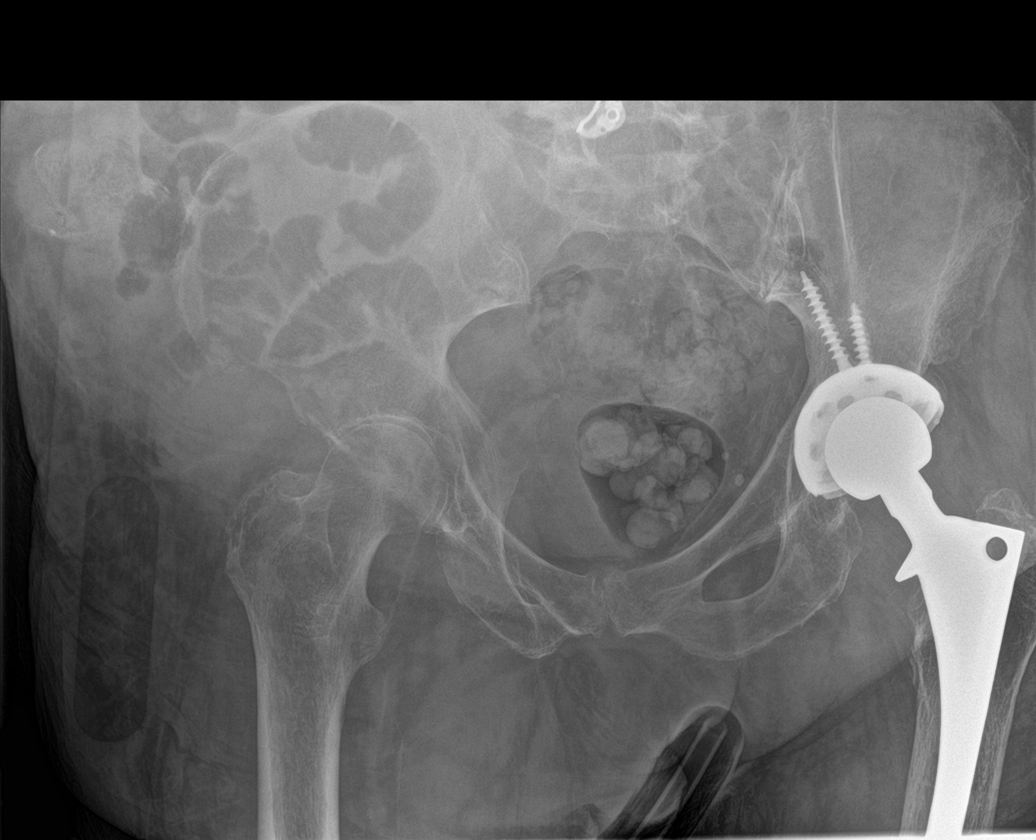

[5 of 5 positions shown; findings below may reference images not displayed]

FINDINGS: SI joints are non widened. Pubic symphysis and rami appear intact.
Left hip replacement with normal alignment and intact hardware. No
fracture or malalignment of the right hip. Moderate degenerative
changes.
IMPRESSION: 1. Status post left hip replacement.
2. No acute osseous abnormality.

## 2021-04-09 MED ORDER — POTASSIUM CHLORIDE 10 MEQ/100ML IV SOLN
10.0000 meq | Freq: Once | INTRAVENOUS | Status: AC
  Administered 2021-04-09: 10 meq via INTRAVENOUS
  Filled 2021-04-09: qty 100

## 2021-04-09 MED ORDER — FENTANYL CITRATE (PF) 100 MCG/2ML IJ SOLN
100.0000 ug | Freq: Once | INTRAMUSCULAR | Status: AC
Start: 2021-04-09 — End: 2021-04-09
  Administered 2021-04-09: 100 ug via INTRAMUSCULAR
  Filled 2021-04-09: qty 2

## 2021-04-09 MED ORDER — ACETAMINOPHEN 325 MG PO TABS
650.0000 mg | ORAL_TABLET | Freq: Once | ORAL | Status: AC
  Administered 2021-04-09: 650 mg via ORAL
  Filled 2021-04-09: qty 2

## 2021-04-09 MED ORDER — HYDROMORPHONE HCL 1 MG/ML IJ SOLN
0.5000 mg | Freq: Once | INTRAMUSCULAR | Status: AC
  Administered 2021-04-09: 0.5 mg via INTRAVENOUS
  Filled 2021-04-09: qty 1

## 2021-04-09 MED ORDER — LIDOCAINE 5 % EX PTCH
1.0000 | MEDICATED_PATCH | CUTANEOUS | Status: DC
  Administered 2021-04-09 – 2021-04-19 (×11): 1 via TRANSDERMAL
  Filled 2021-04-09 (×10): qty 1

## 2021-04-09 NOTE — ED Triage Notes (Addendum)
Pt from home BIB [facility] for c/o right hip pain and generalized weakness. Pt fell some time in April when "knees gave out" and hasn't been very mobile since. Pt normally uses a walker, however has been laying on right hip for a few days only getting up to use the bedside commode. Pt also reports oliguria. Pt took her Tylenol and Hydrocodone at 0730. RLE has 2+ pitting edema that pt said has been present for "a while."

## 2021-04-09 NOTE — ED Provider Notes (Signed)
[facility] EMERGENCY DEPARTMENT Provider Note   CSN: [id number] Arrival date & time: 04/09/21  1547     History Chief Complaint  Patient presents with  . Hip Pain    [name] [name] is a 75 y.o. female.  HPI     75 year old female comes in a chief complaint of pain.  She has history of diabetes, GERD, A. fib on anticoagulation.  Patient has been having severe hip pain for the last several days. She has been taking oxycodones for pain and recently saw an orthopedist, who gave her knee cortisone shot.  Despite that, she is having worsening pain and she is unable to walk.  She is afraid about falling on blood thinner.  She has been trying to get into assisted living, and the paperwork is completed, however they are unable to place her as an outpatient for now.  Patient lives by herself, she is unable to take care of her ADLs, including going to the bathroom.  Home health, PT is already visiting.  Family at the bedside.  Concerns for further deterioration.  Requesting to see if we can get patient's pain and better control, and if we can get her placed sooner.  Past Medical History:  Diagnosis Date  . Anemia   . Arthritis   . Cancer [facility]) 2012   colon  . Diabetes mellitus without complication ([place])   . GERD (gastroesophageal reflux disease)   . Headache   . Hypertension   . Sleep apnea    has cpap, does not use cpap on regular basis    Patient Active Problem List   Diagnosis Date Noted  . Hip pain 04/09/2021  . Hypoglycemia 02/04/2021  . Hypothermia 02/04/2021  . Hypotension 02/04/2021  . Atrial fibrillation, new onset ([place]) 02/04/2021  . AKI (acute kidney injury) ([place]) 02/04/2021  . Atrial fibrillation with rapid ventricular response ([place]) 02/04/2021  . HYPERLIPIDEMIA-MIXED 12/18/2010  . HYPERTENSION, BENIGN 12/18/2010  . SHORTNESS OF BREATH 12/18/2010  . OBSTRUCTIVE SLEEP APNEA 01/11/2008    Past Surgical History:  Procedure Laterality Date   . ABDOMINAL HYSTERECTOMY     partial  . BREAST BIOPSY Bilateral pt unsure   benign  . COLON SURGERY  2012   no chemo or radiation done  . COLONOSCOPY WITH PROPOFOL N/A 01/30/2015   Procedure: COLONOSCOPY WITH PROPOFOL;  Surgeon: Juanita Craver, MD;  Location: [facility] ENDOSCOPY;  Service: Endoscopy;  Laterality: N/A;  . JOINT REPLACEMENT     left hip replacement x 2  . left wrist surgery for fracture     . right hand cortisone injection     cast put on     OB History   No obstetric history on file.     No family history on file.  Social History   Tobacco Use  . Smoking status: Former Smoker    Packs/day: 0.25    Years: 10.00    Pack years: 2.50    Types: Cigarettes    Quit date: 11/29/1964    Years since quitting: 56.3  . Smokeless tobacco: Never Used  Vaping Use  . Vaping Use: Never used  Substance Use Topics  . Alcohol use: No  . Drug use: No    Home Medications Prior to Admission medications   Medication Sig Start Date End Date Taking? Authorizing Provider  acetaminophen (TYLENOL) 650 MG CR tablet Take 1,300 mg by mouth every 8 (eight) hours as needed for pain.   Yes [provider]  ALPRAZolam (  XANAX) 0.25 MG tablet Take 0.25 mg by mouth 2 (two) times daily as needed for anxiety. 03/25/21  Yes [provider]  amiodarone (PACERONE) 200 MG tablet Take 200 mg by mouth 2 (two) times daily. 03/23/21  Yes [provider]  apixaban (ELIQUIS) 5 MG TABS tablet Take 1 tablet (5 mg total) by mouth 2 (two) times daily. 02/08/21  Yes Lavina Hamman, MD  famotidine (PEPCID) 40 MG tablet Take 40 mg by mouth 2 (two) times daily. 11/16/14  Yes [provider]  ferrous sulfate 325 (65 FE) MG tablet Take 325 mg by mouth 3 (three) times daily with meals.   Yes [provider]  FLUoxetine (PROZAC) 40 MG capsule Take 40 mg by mouth daily. 02/14/21  Yes [provider]  HYDROcodone-acetaminophen (NORCO/VICODIN) 5-325 MG tablet Take 1 tablet by  mouth every 6 (six) hours as needed. Patient taking differently: Take 1 tablet by mouth every 6 (six) hours as needed for moderate pain. 10/30/20  Yes Drenda Freeze, MD  Magnesium Oxide 400 MG CAPS Take 1 capsule (400 mg total) by mouth daily. 02/08/21  Yes Lavina Hamman, MD  Multiple Vitamins-Minerals (ONE-A-DAY WOMENS 50+) TABS Take 1 tablet by mouth daily.   Yes [provider]  omeprazole (PRILOSEC) 40 MG capsule Take 40 mg by mouth daily. 02/14/21  Yes [provider]  polyethylene glycol powder (GLYCOLAX/MIRALAX) 17 GM/SCOOP powder Take 17 g by mouth daily. 02/24/21  Yes [provider]  simvastatin (ZOCOR) 80 MG tablet Take 80 mg by mouth at bedtime. 11/13/14  Yes [provider]  methocarbamol (ROBAXIN) 500 MG tablet Take 1 tablet (500 mg total) by mouth every 8 (eight) hours as needed for muscle spasms. Patient not taking: No sig reported 02/19/21   Davonna Belling, MD    Allergies    Ace inhibitors, Morphine and related, Codeine, and Oxycodone-acetaminophen  Review of Systems   Review of Systems  Constitutional: Positive for activity change.  Respiratory: Negative for shortness of breath.   Cardiovascular: Negative for chest pain.  Musculoskeletal: Positive for arthralgias.  Hematological: Does not bruise/bleed easily.  All other systems reviewed and are negative.   Physical Exam Updated Vital Signs BP 118/60   Pulse 77   Temp 98.3 F (36.8 C) (Oral)   Resp 17   SpO2 91%   Physical Exam Vitals and nursing note reviewed.  Constitutional:      Appearance: She is well-developed.  HENT:     Head: Normocephalic and atraumatic.  Cardiovascular:     Rate and Rhythm: Normal rate.  Pulmonary:     Effort: Pulmonary effort is normal.  Abdominal:     General: Bowel sounds are normal.  Musculoskeletal:        General: Tenderness present. No swelling.     Cervical back: Normal range of motion and neck supple.     Comments: Large  spasm over the lower back -with palpable spasms that are tender  Skin:    General: Skin is warm and dry.  Neurological:     Mental Status: She is alert and oriented to person, place, and time.     ED Results / Procedures / Treatments   Labs (all labs ordered are listed, but only abnormal results are displayed) Labs Reviewed  CBC WITH DIFFERENTIAL/PLATELET - Abnormal; Notable for the following components:      Result Value   WBC 3.9 (*)    RBC 3.64 (*)    Hemoglobin 11.0 (*)  HCT 33.8 (*)    All other components within normal limits  BRAIN NATRIURETIC PEPTIDE - Abnormal; Notable for the following components:   B Natriuretic Peptide 129.0 (*)    All other components within normal limits  URINALYSIS, ROUTINE W REFLEX MICROSCOPIC - Abnormal; Notable for the following components:   Color, Urine STRAW (*)    Ketones, ur 5 (*)    All other components within normal limits  BASIC METABOLIC PANEL - Abnormal; Notable for the following components:   Potassium 2.9 (*)    Calcium 8.6 (*)    All other components within normal limits    EKG EKG Interpretation  Date/Time:  Thursday Apr 09 2021 17:56:20 EDT Ventricular Rate:  79 PR Interval:    QRS Duration: 75 QT Interval:  411 QTC Calculation: 472 R Axis:   3 Text Interpretation: Atrial flutter Nonspecific T abnormalities, diffuse leads No acute changes No old tracing to compare Confirmed by Varney Biles [phone]) on 04/09/2021 6:59:51 PM   Radiology No results found.  Procedures Procedures   Medications Ordered in ED Medications  lidocaine (LIDODERM) 5 % 1 patch (1 patch Transdermal Patch Applied 04/09/21 1935)  potassium chloride 10 mEq in 100 mL IVPB (10 mEq Intravenous New Bag/Given 04/09/21 2219)  fentaNYL (SUBLIMAZE) injection 100 mcg (100 mcg Intramuscular Given 04/09/21 1931)  acetaminophen (TYLENOL) tablet 650 mg (650 mg Oral Given 04/09/21 1931)  HYDROmorphone (DILAUDID) injection 0.5 mg (0.5 mg Intravenous Given  04/09/21 2217)    ED Course  I have reviewed the triage vital signs and the nursing notes.  Pertinent labs & imaging results that were available during my care of the patient were reviewed by me and considered in my medical decision making (see chart for details).    MDM Rules/Calculators/A&P                          75 year old comes in with chief complaint of fall and back pain.  It appears that they have been trying to manage her pain the best they can as an outpatient.  She is on pain meds, received a pain shot, despite that remains in significant discomfort.  Her discomfort is now disabling, she is unable to do ADLs and is at risk for falling.  She is on Eliquis.  Labs ordered.  Patient has low potassium.  On exam she has large spasms over the back, likely because she is not moving around.  Those areas are tender to palpation.  X-rays from last month reviewed.  She had come to the ER with pain in the back at that time as well, validating the story that they have been trying to handle this the best they can at home.  We will admit her to the hospital for intractable pain, acute hypokalemia, failure to thrive, and high risk for deterioration at home leading to events such as fall on blood thinner or pelvic fractures.  She will need PT, OT and placement as well  Final Clinical Impression(s) / ED Diagnoses Final diagnoses:  Hypokalemia  Intractable pain  Failure to thrive in adult    Rx / DC Orders ED Discharge Orders    None       Varney Biles, MD 04/09/21 2306

## 2021-04-10 ENCOUNTER — Encounter (HOSPITAL_COMMUNITY): Payer: Self-pay | Admitting: Internal Medicine

## 2021-04-10 DIAGNOSIS — I10 Essential (primary) hypertension: Secondary | ICD-10-CM | POA: Diagnosis present

## 2021-04-10 DIAGNOSIS — F419 Anxiety disorder, unspecified: Secondary | ICD-10-CM | POA: Diagnosis present

## 2021-04-10 DIAGNOSIS — M25551 Pain in right hip: Secondary | ICD-10-CM | POA: Diagnosis present

## 2021-04-10 DIAGNOSIS — Z20822 Contact with and (suspected) exposure to covid-19: Secondary | ICD-10-CM | POA: Diagnosis present

## 2021-04-10 DIAGNOSIS — M25559 Pain in unspecified hip: Secondary | ICD-10-CM | POA: Diagnosis not present

## 2021-04-10 DIAGNOSIS — G4733 Obstructive sleep apnea (adult) (pediatric): Secondary | ICD-10-CM | POA: Diagnosis present

## 2021-04-10 DIAGNOSIS — E876 Hypokalemia: Secondary | ICD-10-CM | POA: Diagnosis present

## 2021-04-10 DIAGNOSIS — Z90710 Acquired absence of both cervix and uterus: Secondary | ICD-10-CM | POA: Diagnosis not present

## 2021-04-10 DIAGNOSIS — E119 Type 2 diabetes mellitus without complications: Secondary | ICD-10-CM | POA: Diagnosis present

## 2021-04-10 DIAGNOSIS — I4821 Permanent atrial fibrillation: Secondary | ICD-10-CM | POA: Diagnosis present

## 2021-04-10 DIAGNOSIS — R262 Difficulty in walking, not elsewhere classified: Secondary | ICD-10-CM | POA: Diagnosis present

## 2021-04-10 DIAGNOSIS — Z96642 Presence of left artificial hip joint: Secondary | ICD-10-CM | POA: Diagnosis present

## 2021-04-10 DIAGNOSIS — R112 Nausea with vomiting, unspecified: Secondary | ICD-10-CM | POA: Diagnosis not present

## 2021-04-10 DIAGNOSIS — G8929 Other chronic pain: Secondary | ICD-10-CM | POA: Diagnosis present

## 2021-04-10 DIAGNOSIS — E669 Obesity, unspecified: Secondary | ICD-10-CM | POA: Diagnosis present

## 2021-04-10 DIAGNOSIS — Z87891 Personal history of nicotine dependence: Secondary | ICD-10-CM | POA: Diagnosis not present

## 2021-04-10 DIAGNOSIS — Z79899 Other long term (current) drug therapy: Secondary | ICD-10-CM | POA: Diagnosis not present

## 2021-04-10 DIAGNOSIS — Z885 Allergy status to narcotic agent status: Secondary | ICD-10-CM | POA: Diagnosis not present

## 2021-04-10 DIAGNOSIS — R627 Adult failure to thrive: Secondary | ICD-10-CM | POA: Diagnosis present

## 2021-04-10 DIAGNOSIS — Z66 Do not resuscitate: Secondary | ICD-10-CM | POA: Diagnosis present

## 2021-04-10 DIAGNOSIS — K219 Gastro-esophageal reflux disease without esophagitis: Secondary | ICD-10-CM | POA: Diagnosis present

## 2021-04-10 DIAGNOSIS — I4891 Unspecified atrial fibrillation: Secondary | ICD-10-CM

## 2021-04-10 DIAGNOSIS — Z7901 Long term (current) use of anticoagulants: Secondary | ICD-10-CM | POA: Diagnosis not present

## 2021-04-10 DIAGNOSIS — E785 Hyperlipidemia, unspecified: Secondary | ICD-10-CM | POA: Diagnosis present

## 2021-04-10 DIAGNOSIS — E782 Mixed hyperlipidemia: Secondary | ICD-10-CM | POA: Diagnosis present

## 2021-04-10 LAB — BASIC METABOLIC PANEL
Anion gap: 6 (ref 5–15)
BUN: 9 mg/dL (ref 8–23)
CO2: 27 mmol/L (ref 22–32)
Calcium: 8.6 mg/dL — ABNORMAL LOW (ref 8.9–10.3)
Chloride: 104 mmol/L (ref 98–111)
Creatinine, Ser: 0.73 mg/dL (ref 0.44–1.00)
GFR, Estimated: >60 mL/min (ref >60)
Glucose, Bld: 79 mg/dL (ref 70–99)
Potassium: 3 mmol/L — ABNORMAL LOW (ref 3.5–5.1)
Sodium: 137 mmol/L (ref 135–145)

## 2021-04-10 LAB — GLUCOSE, CAPILLARY
Glucose-Capillary: 71 mg/dL (ref 70–99)
Glucose-Capillary: 96 mg/dL (ref 70–99)
Glucose-Capillary: 109 mg/dL — ABNORMAL HIGH (ref 70–99)
Glucose-Capillary: 115 mg/dL — ABNORMAL HIGH (ref 70–99)

## 2021-04-10 LAB — MAGNESIUM: Magnesium: 1.6 mg/dL — ABNORMAL LOW (ref 1.7–2.4)

## 2021-04-10 LAB — SARS CORONAVIRUS 2 (TAT 6-24 HRS): SARS Coronavirus 2: NEGATIVE

## 2021-04-10 LAB — HEMOGLOBIN A1C
Hgb A1c MFr Bld: 5.7 % — ABNORMAL HIGH (ref 4.8–5.6)
Mean Plasma Glucose: 116.89 mg/dL

## 2021-04-10 MED ORDER — ALPRAZOLAM 0.25 MG PO TABS
0.2500 mg | ORAL_TABLET | Freq: Two times a day (BID) | ORAL | Status: DC | PRN
  Administered 2021-04-10 – 2021-04-19 (×12): 0.25 mg via ORAL
  Filled 2021-04-10 (×12): qty 1

## 2021-04-10 MED ORDER — ADULT MULTIVITAMIN W/MINERALS CH
1.0000 | ORAL_TABLET | Freq: Every day | ORAL | Status: DC
  Administered 2021-04-10 – 2021-04-20 (×11): 1 via ORAL
  Filled 2021-04-10 (×11): qty 1

## 2021-04-10 MED ORDER — FERROUS SULFATE 325 (65 FE) MG PO TABS
325.0000 mg | ORAL_TABLET | Freq: Three times a day (TID) | ORAL | Status: DC
  Administered 2021-04-10 – 2021-04-20 (×33): 325 mg via ORAL
  Filled 2021-04-10 (×33): qty 1

## 2021-04-10 MED ORDER — POTASSIUM CHLORIDE CRYS ER 20 MEQ PO TBCR
40.0000 meq | EXTENDED_RELEASE_TABLET | Freq: Once | ORAL | Status: AC
  Administered 2021-04-10: 40 meq via ORAL
  Filled 2021-04-10: qty 2

## 2021-04-10 MED ORDER — HYDROCODONE-ACETAMINOPHEN 5-325 MG PO TABS
1.0000 | ORAL_TABLET | Freq: Four times a day (QID) | ORAL | Status: DC | PRN
Start: 2021-04-10 — End: 2021-04-20
  Administered 2021-04-10 – 2021-04-20 (×20): 1 via ORAL
  Filled 2021-04-10 (×21): qty 1

## 2021-04-10 MED ORDER — PANTOPRAZOLE SODIUM 40 MG PO TBEC
40.0000 mg | DELAYED_RELEASE_TABLET | Freq: Every day | ORAL | Status: DC
  Administered 2021-04-10 – 2021-04-20 (×11): 40 mg via ORAL
  Filled 2021-04-10 (×11): qty 1

## 2021-04-10 MED ORDER — POLYETHYLENE GLYCOL 3350 17 GM/SCOOP PO POWD
17.0000 g | Freq: Every day | ORAL | Status: DC
  Filled 2021-04-10: qty 255

## 2021-04-10 MED ORDER — AMIODARONE HCL 200 MG PO TABS
200.0000 mg | ORAL_TABLET | Freq: Two times a day (BID) | ORAL | Status: DC
  Administered 2021-04-10 – 2021-04-20 (×21): 200 mg via ORAL
  Filled 2021-04-10 (×21): qty 1

## 2021-04-10 MED ORDER — POLYETHYLENE GLYCOL 3350 17 G PO PACK
17.0000 g | PACK | Freq: Every day | ORAL | Status: DC
  Administered 2021-04-10 – 2021-04-20 (×6): 17 g via ORAL
  Filled 2021-04-10 (×9): qty 1

## 2021-04-10 MED ORDER — APIXABAN 5 MG PO TABS
5.0000 mg | ORAL_TABLET | Freq: Two times a day (BID) | ORAL | Status: DC
  Administered 2021-04-10 – 2021-04-20 (×21): 5 mg via ORAL
  Filled 2021-04-10 (×21): qty 1

## 2021-04-10 MED ORDER — FLUOXETINE HCL 20 MG PO CAPS
40.0000 mg | ORAL_CAPSULE | Freq: Every day | ORAL | Status: DC
  Administered 2021-04-10 – 2021-04-20 (×11): 40 mg via ORAL
  Filled 2021-04-10 (×11): qty 2

## 2021-04-10 MED ORDER — MAGNESIUM SULFATE 2 GM/50ML IV SOLN
2.0000 g | Freq: Once | INTRAVENOUS | Status: AC
  Administered 2021-04-10: 2 g via INTRAVENOUS
  Filled 2021-04-10: qty 50

## 2021-04-10 MED ORDER — POTASSIUM CHLORIDE CRYS ER 20 MEQ PO TBCR
40.0000 meq | EXTENDED_RELEASE_TABLET | ORAL | Status: AC
  Administered 2021-04-10 (×2): 40 meq via ORAL
  Filled 2021-04-10 (×2): qty 2

## 2021-04-10 MED ORDER — INSULIN ASPART 100 UNIT/ML IJ SOLN
0.0000 [IU] | Freq: Every day | INTRAMUSCULAR | Status: DC
Start: 2021-04-10 — End: 2021-04-20

## 2021-04-10 MED ORDER — ATORVASTATIN CALCIUM 40 MG PO TABS
40.0000 mg | ORAL_TABLET | Freq: Every day | ORAL | Status: DC
  Administered 2021-04-10 – 2021-04-20 (×11): 40 mg via ORAL
  Filled 2021-04-10 (×11): qty 1

## 2021-04-10 MED ORDER — MAGNESIUM OXIDE -MG SUPPLEMENT 400 (240 MG) MG PO TABS
400.0000 mg | ORAL_TABLET | Freq: Every day | ORAL | Status: DC
  Administered 2021-04-10 – 2021-04-20 (×11): 400 mg via ORAL
  Filled 2021-04-10 (×11): qty 1

## 2021-04-10 MED ORDER — INSULIN ASPART 100 UNIT/ML IJ SOLN
0.0000 [IU] | Freq: Three times a day (TID) | INTRAMUSCULAR | Status: DC
  Administered 2021-04-13 – 2021-04-16 (×2): 1 [IU] via SUBCUTANEOUS
  Administered 2021-04-17: 2 [IU] via SUBCUTANEOUS
  Administered 2021-04-19: 1 [IU] via SUBCUTANEOUS

## 2021-04-10 MED ORDER — METHOCARBAMOL 500 MG PO TABS
500.0000 mg | ORAL_TABLET | Freq: Three times a day (TID) | ORAL | Status: DC | PRN
  Administered 2021-04-10 – 2021-04-20 (×8): 500 mg via ORAL
  Filled 2021-04-10 (×8): qty 1

## 2021-04-10 MED ORDER — FAMOTIDINE 20 MG PO TABS
40.0000 mg | ORAL_TABLET | Freq: Two times a day (BID) | ORAL | Status: DC
  Administered 2021-04-10 – 2021-04-20 (×21): 40 mg via ORAL
  Filled 2021-04-10 (×21): qty 2

## 2021-04-10 NOTE — H&P (Signed)
History and Physical    [name] [name]:[id number] DOB: 31-Mar-1946 DOA: 04/09/2021  PCP: Chesley Noon, MD Patient coming from: Home  Chief Complaint: Right hip pain  HPI: [name] [name] is a 75 y.o. female with medical history significant of A. fib on Eliquis, type 2 diabetes, hypertension, hyperlipidemia, anxiety, anemia, obesity (BMI 37.76), GERD, sleep apnea presented to the ED with complaints of severe right hip pain for several days and inability to walk.  In the ED, vital signs stable.  She was given Tylenol, fentanyl, and Dilaudid for pain.  Lidocaine patch applied.  She was given IV potassium 10 mEq x 1.  Labs showing WBC 3.9, hemoglobin 11.0 (stable), platelet count 202K.  Sodium 136, potassium 2.9, chloride 103, bicarb 26, BUN 11, creatinine 0.7, glucose 82.  BNP 129.  UA without signs of infection.  Screening COVID test pending.  X-ray of hip showing moderate degenerative changes and no acute findings.  Patient states she has had pain in her right hip for a long time but it has been worse for the past few days.  She had a fall back in January but denies any recent falls.  Due to her pain, it is difficult for her to ambulate at home.  She lives by herself and her primary care physician is currently working on finding her a bed at an assisted living facility.  States she is seen by an orthopedics and recently had a steroid shot but continues to to have pain in this hip.  Review of Systems:  All systems reviewed and apart from history of presenting illness, are negative.  Past Medical History:  Diagnosis Date  . Anemia   . Arthritis   . Cancer [facility]) 2012   colon  . Diabetes mellitus without complication ([place])   . GERD (gastroesophageal reflux disease)   . Headache   . Hypertension   . Sleep apnea    has cpap, does not use cpap on regular basis    Past Surgical History:  Procedure Laterality Date  . ABDOMINAL HYSTERECTOMY     partial  . BREAST BIOPSY Bilateral pt  unsure   benign  . COLON SURGERY  2012   no chemo or radiation done  . COLONOSCOPY WITH PROPOFOL N/A 01/30/2015   Procedure: COLONOSCOPY WITH PROPOFOL;  Surgeon: Juanita Craver, MD;  Location: [facility];  Service: Endoscopy;  Laterality: N/A;  . JOINT REPLACEMENT     left hip replacement x 2  . left wrist surgery for fracture     . right hand cortisone injection     cast put on     reports that she quit smoking about 56 years ago. Her smoking use included cigarettes. She has a 2.50 pack-year smoking history. She has never used smokeless tobacco. She reports that she does not drink alcohol and does not use drugs.  Allergies  Allergen Reactions  . Ace Inhibitors   . Morphine And Related     hallucinations  . Codeine Other (See Comments)    headache  . Oxycodone-Acetaminophen Nausea And Vomiting    History reviewed. No pertinent family history.  Prior to Admission medications   Medication Sig Start Date End Date Taking? Authorizing Provider  acetaminophen (TYLENOL) 650 MG CR tablet Take 1,300 mg by mouth every 8 (eight) hours as needed for pain.   Yes [provider]  ALPRAZolam (XANAX) 0.25 MG tablet Take 0.25 mg by mouth 2 (two) times daily as needed for anxiety. 03/25/21  Yes  [provider]  amiodarone (PACERONE) 200 MG tablet Take 200 mg by mouth 2 (two) times daily. 03/23/21  Yes [provider]  apixaban (ELIQUIS) 5 MG TABS tablet Take 1 tablet (5 mg total) by mouth 2 (two) times daily. 02/08/21  Yes Lavina Hamman, MD  famotidine (PEPCID) 40 MG tablet Take 40 mg by mouth 2 (two) times daily. 11/16/14  Yes [provider]  ferrous sulfate 325 (65 FE) MG tablet Take 325 mg by mouth 3 (three) times daily with meals.   Yes [provider]  FLUoxetine (PROZAC) 40 MG capsule Take 40 mg by mouth daily. 02/14/21  Yes [provider]  HYDROcodone-acetaminophen (NORCO/VICODIN) 5-325 MG tablet Take 1 tablet by mouth every 6 (six) hours as  needed. Patient taking differently: Take 1 tablet by mouth every 6 (six) hours as needed for moderate pain. 10/30/20  Yes Drenda Freeze, MD  Magnesium Oxide 400 MG CAPS Take 1 capsule (400 mg total) by mouth daily. 02/08/21  Yes Lavina Hamman, MD  Multiple Vitamins-Minerals (ONE-A-DAY WOMENS 50+) TABS Take 1 tablet by mouth daily.   Yes [provider]  omeprazole (PRILOSEC) 40 MG capsule Take 40 mg by mouth daily. 02/14/21  Yes [provider]  polyethylene glycol powder (GLYCOLAX/MIRALAX) 17 GM/SCOOP powder Take 17 g by mouth daily. 02/24/21  Yes [provider]  simvastatin (ZOCOR) 80 MG tablet Take 80 mg by mouth at bedtime. 11/13/14  Yes [provider]  methocarbamol (ROBAXIN) 500 MG tablet Take 1 tablet (500 mg total) by mouth every 8 (eight) hours as needed for muscle spasms. Patient not taking: No sig reported 02/19/21   Davonna Belling, MD    Physical Exam: Vitals:   04/09/21 2230 04/10/21 0015 04/10/21 0052 04/10/21 0319  BP: 119/71 132/64 (!) 151/75 (!) 156/71  Pulse: 82 72 87 83  Resp: 16 17 18 18   Temp:   98.4 F (36.9 C) 98.6 F (37 C)  TempSrc:   Oral Oral  SpO2: 91% 96% 99% 98%  Weight:   87.7 kg   Height:   5' (1.524 m)   Physical Exam Constitutional:      General: She is not in acute distress. HENT:     Head: Normocephalic and atraumatic.  Eyes:     Extraocular Movements: Extraocular movements intact.  Cardiovascular:     Rate and Rhythm: Normal rate and regular rhythm.     Pulses: Normal pulses.  Pulmonary:     Effort: Pulmonary effort is normal. No respiratory distress.     Breath sounds: Normal breath sounds. No wheezing or rales.  Abdominal:     General: Bowel sounds are normal. There is no distension.     Palpations: Abdomen is soft.     Tenderness: There is no abdominal tenderness.  Musculoskeletal:     Cervical back: Normal range of motion and neck supple.     Comments: Right lower extremity: Mild pedal edema  (chronic per patient)  Skin:    General: Skin is warm and dry.  Neurological:     General: No focal deficit present.     Mental Status: She is alert and oriented to person, place, and time.     Labs on Admission: I have personally reviewed following labs and imaging studies  CBC: Recent Labs  Lab 04/09/21 1725  WBC 3.9*  NEUTROABS 1.7  HGB 11.0*  HCT 33.8*  MCV 92.9  PLT 518   Basic Metabolic Panel: Recent Labs  Lab  04/09/21 1816  NA 136  K 2.9*  CL 103  CO2 26  GLUCOSE 82  BUN 11  CREATININE 0.72  CALCIUM 8.6*   GFR: Estimated Creatinine Clearance: 58.9 mL/min (by C-G formula based on SCr of 0.72 mg/dL). Liver Function Tests: No results for input(s): AST, ALT, ALKPHOS, BILITOT, PROT, ALBUMIN in the last 168 hours. No results for input(s): LIPASE, AMYLASE in the last 168 hours. No results for input(s): AMMONIA in the last 168 hours. Coagulation Profile: No results for input(s): INR, PROTIME in the last 168 hours. Cardiac Enzymes: No results for input(s): CKTOTAL, CKMB, CKMBINDEX, TROPONINI in the last 168 hours. BNP (last 3 results) No results for input(s): PROBNP in the last 8760 hours. HbA1C: No results for input(s): HGBA1C in the last 72 hours. CBG: Recent Labs  Lab 04/10/21 0605  GLUCAP 71   Lipid Profile: No results for input(s): CHOL, HDL, LDLCALC, TRIG, CHOLHDL, LDLDIRECT in the last 72 hours. Thyroid Function Tests: No results for input(s): TSH, T4TOTAL, FREET4, T3FREE, THYROIDAB in the last 72 hours. Anemia Panel: No results for input(s): VITAMINB12, FOLATE, FERRITIN, TIBC, IRON, RETICCTPCT in the last 72 hours. Urine analysis:    Component Value Date/Time   COLORURINE STRAW (A) 04/09/2021 1847   APPEARANCEUR CLEAR 04/09/2021 1847   LABSPEC 1.008 04/09/2021 1847   PHURINE 7.0 04/09/2021 1847   GLUCOSEU NEGATIVE 04/09/2021 1847   HGBUR NEGATIVE 04/09/2021 1847   BILIRUBINUR NEGATIVE 04/09/2021 1847   KETONESUR 5 (A) 04/09/2021 1847    PROTEINUR NEGATIVE 04/09/2021 1847   UROBILINOGEN 0.2 01/07/2011 1002   NITRITE NEGATIVE 04/09/2021 1847   LEUKOCYTESUR NEGATIVE 04/09/2021 1847    Radiological Exams on Admission: DG Hip Unilat W or Wo Pelvis 2-3 Views Right  Result Date: 04/09/2021 CLINICAL DATA:  Fall EXAM: DG HIP (WITH OR WITHOUT PELVIS) 2-3V RIGHT COMPARISON:  02/19/2021 FINDINGS: SI joints are non widened. Pubic symphysis and rami appear intact. Left hip replacement with normal alignment and intact hardware. No fracture or malalignment of the right hip. Moderate degenerative changes. IMPRESSION: 1. Status post left hip replacement. 2. No acute osseous abnormality. Electronically Signed   By: Donavan Foil M.D.   On: 04/09/2021 23:33    EKG: Independently reviewed.  Rate Controlled A. fib, no acute changes.  Assessment/Plan Principal Problem:   Hip pain Active Problems:   HLD (hyperlipidemia)   Hypokalemia   Atrial fibrillation (HCC)   Type 2 diabetes mellitus (HCC)   Acute on chronic right hip pain X-ray showing moderate degenerative changes and no acute findings. -PT/OT and social work consulted to help with placement as patient lives alone and is having difficulty ambulating.  Norco as needed for pain, Robaxin as needed, lidocaine patch.  Hypokalemia Potassium 2.9. -Monitor potassium and magnesium levels, replenish as needed.  A. Fib Currently rate controlled. -Continue amiodarone and Eliquis  Type 2 diabetes A1c 5.4 on 03/12/2021. -Sliding scale insulin sensitive ACHS.  Hyperlipidemia -Continue Lipitor  GERD -Continue PPI and H2 blocker  DVT prophylaxis: Eliquis Code Status: Patient wishes to be DNR. Family Communication: No family available at this time. Disposition Plan: Status is: Observation  The patient remains OBS appropriate and will d/c before 2 midnights.  Dispo: The patient is from: Home              Anticipated d/c is to: SNF              Patient currently is not medically  stable to d/c.   Difficult to place patient  No  Level of care: Level of care: Med-Surg   The medical decision making on this patient was of high complexity and the patient is at high risk for clinical deterioration, therefore this is a level 3 visit.  Shela Leff MD [facility]  If 7PM-7AM, please contact night-coverage [email or web address]  04/10/2021, 6:12 AM

## 2021-04-10 NOTE — Evaluation (Signed)
Physical Therapy Evaluation Patient Details Name: [name] [name] MRN: [id number] DOB: May 24, 1946 Today's Date: 04/10/2021   History of Present Illness  75 y.o. female presented to the ED 04/09/21 with complaints of severe right hip pain for several days and inability to walk.  X-ray of hip showing moderate degenerative changes and no acute findings Dx: acute on chronic hip pain.   PMH A. fib on Eliquis, type 2 diabetes, hypertension, hyperlipidemia, anxiety, anemia, obesity (BMI 37.76), GERD, sleep apnea  Clinical Impression  PTA pt was living alone in single story home with ramped entrance. Pt was having increased difficulty with ambulation using a Rollator in her home secondary to R hip pain which started after a fall this past winter. Pt is able to perform bADLs with increased time and effort and sister provides for iADLs. Pt is currently limited in safe mobility by increased R hip pain with weightbearing, as well as generalized weakness and decreased balance and endurance from months of decreased mobility. Pt is min guard for transfers and min A for ambulation with RW requiring seated rest break due to fatigue with 30 feet ambulation. PT recommending short bout of SNF level rehab prior to transition to an ALF. PT will continue to follow acutely.     Follow Up Recommendations SNF    Equipment Recommendations  None recommended by PT (has necessary equipment)    Recommendations for Other Services OT consult     Precautions / Restrictions Precautions Precautions: Fall Precaution Comments: Had an episode of several falls last December Restrictions Weight Bearing Restrictions: No      Mobility  Bed Mobility               General bed mobility comments: up in recliner on entry    Transfers Overall transfer level: Needs assistance Equipment used: Rolling walker (2 wheeled) Transfers: Sit to/from Stand Sit to Stand: Min guard         General transfer comment: min guard for  safety, cuing for hand placement for power up able to self steady with RW  Ambulation/Gait Ambulation/Gait assistance: Min assist Gait Distance (Feet): 30 Feet Assistive device: Rolling walker (2 wheeled) Gait Pattern/deviations: Step-through pattern;Decreased step length - right;Decreased step length - left;Decreased weight shift to right;Shuffle;Antalgic;Trunk flexed Gait velocity: slowed Gait velocity interpretation: <1.31 ft/sec, indicative of household ambulator General Gait Details: light min A for safety, x R knee buckle with fatigue, no LoB but requires seated rest break before returning to room     Balance Overall balance assessment: Needs assistance Sitting-balance support: No upper extremity supported;Feet supported Sitting balance-Leahy Scale: Fair     Standing balance support: Single extremity supported;During functional activity Standing balance-Leahy Scale: Fair                               Pertinent Vitals/Pain Pain Assessment: Faces Faces Pain Scale: Hurts little more Pain Location: R hip Pain Descriptors / Indicators: Discomfort;Grimacing Pain Intervention(s): Limited activity within patient's tolerance;Monitored during session;Repositioned    Home Living Family/patient expects to be discharged to:: Private residence Living Arrangements: Alone Available Help at Discharge: Family;Available PRN/intermittently (sister comes as she can) Type of Home: Mobile home         Home Equipment: Walker - 4 wheels      Prior Function Level of Independence: Needs assistance   Gait / Transfers Assistance Needed: Use of RW  ADL's / Homemaking Assistance Needed: Performing BADLs but slowly and  with a lot of effort. sister helps with IADLs        Hand Dominance   Dominant Hand: Right    Extremity/Trunk Assessment   Upper Extremity Assessment Upper Extremity Assessment: Defer to OT evaluation    Lower Extremity Assessment Lower Extremity  Assessment: RLE deficits/detail RLE Deficits / Details: R hip pain limiting AROM, strength grossly 3+/5 RLE: Unable to fully assess due to pain RLE Coordination: decreased fine motor;decreased gross motor    Cervical / Trunk Assessment Cervical / Trunk Assessment: Other exceptions Cervical / Trunk Exceptions: Increased body habitus  Communication   Communication: No difficulties  Cognition Arousal/Alertness: Awake/alert Behavior During Therapy: WFL for tasks assessed/performed Overall Cognitive Status: Within Functional Limits for tasks assessed                                 General Comments: Feel she is at baseline for cognition      General Comments General comments (skin integrity, edema, etc.): VSS on RA, sister in room        Assessment/Plan    PT Assessment Patient needs continued PT services  PT Problem List Decreased strength;Decreased range of motion;Decreased activity tolerance;Decreased balance;Decreased mobility;Decreased coordination;Pain       PT Treatment Interventions DME instruction;Gait training;Functional mobility training;Therapeutic activities;Therapeutic exercise;Balance training;Cognitive remediation;Patient/family education    PT Goals (Current goals can be found in the Care Plan section)  Acute Rehab PT Goals Patient Stated Goal: Get stronger - get into a ALF after rehab PT Goal Formulation: With patient/family Time For Goal Achievement: 04/24/21 Potential to Achieve Goals: Good    Frequency Min 2X/week   Barriers to discharge Decreased caregiver support         AM-PAC PT "6 Clicks" Mobility  Outcome Measure Help needed turning from your back to your side while in a flat bed without using bedrails?: None Help needed moving from lying on your back to sitting on the side of a flat bed without using bedrails?: None Help needed moving to and from a bed to a chair (including a wheelchair)?: None Help needed standing up from a  chair using your arms (e.g., wheelchair or bedside chair)?: None Help needed to walk in hospital room?: A Little Help needed climbing 3-5 steps with a railing? : Total 6 Click Score: 20    End of Session Equipment Utilized During Treatment: Gait belt Activity Tolerance: Patient limited by pain;Patient limited by fatigue Patient left: in chair;with call bell/phone within reach;with family/visitor present Nurse Communication: Mobility status PT Visit Diagnosis: Unsteadiness on feet (R26.81);Other abnormalities of gait and mobility (R26.89);History of falling (Z91.81);Muscle weakness (generalized) (M62.81);Difficulty in walking, not elsewhere classified (R26.2);Pain Pain - Right/Left: Right Pain - part of body: Hip;Knee    Time: 9735-3299 PT Time Calculation (min) (ACUTE ONLY): 19 min   Charges:   PT Evaluation $PT Eval Moderate Complexity: 1 Mod          Desere Gwin B. Migdalia Dk PT, DPT Acute Rehabilitation Services Pager [phone] Office [phone]   [place] 04/10/2021, 10:36 AM

## 2021-04-10 NOTE — Evaluation (Signed)
Occupational Therapy Evaluation Patient Details Name: [name] [name] MRN: [id number] DOB: 11-30-1945 Today's Date: 04/10/2021    History of Present Illness  75 y.o. female presented to the ED 04/09/21 with complaints of severe right hip pain for several days and inability to walk.  X-ray of hip showing moderate degenerative changes and no acute findings Dx: acute on chronic hip pain. PMH A. fib on Eliquis, type 2 diabetes, HTN, hyperlipidemia, anxiety, anemia, obesity (BMI 37.76), GERD, and sleep apnea.   Clinical Impression   PTA, pt was living alone and was performing BADLs and using RW; sister performing IADLs. Pt presenting with decreased balance, ROM, and activity tolerance. Pt requiring Mod-Max A for LB ADLs and Min guard-Min A for functional mobility and RW. Pt would benefit from further acute OT to facilitate safe dc. Due limited support and poor functional performance, recommend dc to SNF for further OT to optimize safety, independence with ADLs, and return to PLOF.     Follow Up Recommendations  SNF    Equipment Recommendations  Other (comment) (Defer to next venue)    Recommendations for Other Services PT consult     Precautions / Restrictions Precautions Precautions: Fall Precaution Comments: Had an episode of several falls last December Restrictions Weight Bearing Restrictions: No      Mobility Bed Mobility               General bed mobility comments: at [facility] upon arrival    Transfers Overall transfer level: Needs assistance Equipment used: Rolling walker (2 wheeled) Transfers: Sit to/from Stand Sit to Stand: Min assist;From elevated surface         General transfer comment: Min A for gaining balance in standing from elevate surface    Balance Overall balance assessment: Needs assistance Sitting-balance support: No upper extremity supported;Feet supported Sitting balance-Leahy Scale: Fair     Standing balance support: Single extremity  supported;During functional activity Standing balance-Leahy Scale: Fair                             ADL either performed or assessed with clinical judgement   ADL Overall ADL's : Needs assistance/impaired Eating/Feeding: Set up;Sitting   Grooming: Set up;Supervision/safety;Sitting   Upper Body Bathing: Supervision/ safety;Set up;Sitting   Lower Body Bathing: Sit to/from stand;Moderate assistance   Upper Body Dressing : Supervision/safety;Set up;Sitting   Lower Body Dressing: Maximal assistance;Sit to/from stand   Toilet Transfer: Minimal assistance;RW;Ambulation;BSC   Toileting- Clothing Manipulation and Hygiene: Minimal assistance;Sit to/from stand Toileting - Clothing Manipulation Details (indicate cue type and reason): Pt able to perform peri care with Min A for balance     Functional mobility during ADLs: Minimal assistance;Rolling walker General ADL Comments: Presenting with decreased balance, ROM, and strength. limited by pain at R hip     Vision         Perception     Praxis      Pertinent Vitals/Pain Pain Assessment: Faces Faces Pain Scale: Hurts little more Pain Location: R hip Pain Descriptors / Indicators: Discomfort;Grimacing Pain Intervention(s): Monitored during session;Limited activity within patient's tolerance;Repositioned     Hand Dominance Right   Extremity/Trunk Assessment Upper Extremity Assessment Upper Extremity Assessment: Overall WFL for tasks assessed   Lower Extremity Assessment Lower Extremity Assessment: Defer to PT evaluation   Cervical / Trunk Assessment Cervical / Trunk Assessment: Other exceptions Cervical / Trunk Exceptions: Increased body habitus   Communication Communication Communication: No difficulties   Cognition  Arousal/Alertness: Awake/alert Behavior During Therapy: WFL for tasks assessed/performed Overall Cognitive Status: Within Functional Limits for tasks assessed                                  General Comments: Feel she is at baseline for cognition   General Comments       Exercises     Shoulder Instructions      Home Living Family/patient expects to be discharged to:: Private residence Living Arrangements: Alone Available Help at Discharge: Family;Available PRN/intermittently (sister comes as she can) Type of Home: Mobile home                       Home Equipment: Walker - 4 wheels          Prior Functioning/Environment Level of Independence: Needs assistance  Gait / Transfers Assistance Needed: Use of RW ADL's / Homemaking Assistance Needed: Performing BADLs but slowly and with a lot of effort. sister helps with IADLs            OT Problem List: Decreased strength;Decreased range of motion;Decreased activity tolerance;Impaired balance (sitting and/or standing);Decreased knowledge of use of DME or AE;Decreased knowledge of precautions;Pain      OT Treatment/Interventions: Self-care/ADL training;Therapeutic exercise;Energy conservation;DME and/or AE instruction;Therapeutic activities;Patient/family education    OT Goals(Current goals can be found in the care plan section) Acute Rehab OT Goals Patient Stated Goal: Get stronger - get into a ALF after rehab OT Goal Formulation: With patient Time For Goal Achievement: 04/24/21 Potential to Achieve Goals: Good  OT Frequency: Min 2X/week   Barriers to D/C: Decreased caregiver support          Co-evaluation              AM-PAC OT "6 Clicks" Daily Activity     Outcome Measure Help from another person eating meals?: A Little Help from another person taking care of personal grooming?: A Little Help from another person toileting, which includes using toliet, bedpan, or urinal?: A Little Help from another person bathing (including washing, rinsing, drying)?: A Lot Help from another person to put on and taking off regular upper body clothing?: A Little Help from another person to put  on and taking off regular lower body clothing?: A Lot 6 Click Score: 16   End of Session Equipment Utilized During Treatment: Rolling walker;Gait belt Nurse Communication: Mobility status  Activity Tolerance: Patient tolerated treatment well Patient left: in chair;with call bell/phone within reach  OT Visit Diagnosis: Unsteadiness on feet (R26.81);Other abnormalities of gait and mobility (R26.89);Muscle weakness (generalized) (M62.81);Pain Pain - Right/Left: Right Pain - part of body: Hip                Time: 8453-6468 OT Time Calculation (min): 24 min Charges:  OT General Charges $OT Visit: 1 Visit OT Evaluation $OT Eval Moderate Complexity: 1 Mod OT Treatments $Self Care/Home Management : 8-22 mins  Robena Ewy MSOT, OTR/L Acute Rehab Pager: [phone] Office: [place] 04/10/2021, 9:54 AM

## 2021-04-10 NOTE — Plan of Care (Signed)
  Problem: Education: Goal: Knowledge of General Education information will improve Description Including pain rating scale, medication(s)/side effects and non-pharmacologic comfort measures Outcome: Progressing   

## 2021-04-10 NOTE — Progress Notes (Signed)
Patient arrived from ED to 5C20. VSS. Notified MD of patient arrival.  Assist patient turning on her side, pt able to move bil upper extremities and left leg, limited movement on right leg.

## 2021-04-10 NOTE — TOC Initial Note (Signed)
Transition of Care [facility]) - Initial/Assessment Note    Patient Details  Name: [name] [name] MRN: [id number] Date of Birth: 11/04/1946  Transition of Care [facility]) CM/SW Contact:    Bethann Berkshire, [place] Phone Number: 04/10/2021, 3:24 PM  Clinical Narrative:                  CSW met with pt for SNF consult. Pt lives in a mobile home in [place] alone. States she has a sister who helps some that lives about 2 miles away. States her sister is limited in ability to help due to her sister's husband being sick. Pt reports she has DME at home including rolling walker, wheel chair, and 3in1. Pt consents to CSW contacting sister if needed. Pt is agreeable to SNF workup. CSW explains snf and insurance auth process. Pt is not vaccinated. Fl2 completed and bed requests sent in hub.   Expected Discharge Plan: Skilled Nursing Facility Barriers to Discharge: Continued Medical Work up   Patient Goals and CMS Choice Patient states their goals for this hospitalization and ongoing recovery are:: SNF for rehab CMS Medicare.gov Compare Post Acute Care list provided to:: Patient Choice offered to / list presented to : Patient  Expected Discharge Plan and Services Expected Discharge Plan: [place]       Living arrangements for the past 2 months: Mobile Home                                      Prior Living Arrangements/Services Living arrangements for the past 2 months: Mobile Home Lives with:: Self Patient language and need for interpreter reviewed:: Yes        Need for Family Participation in Patient Care: No (Comment) Care giver support system in place?: No (comment)   Criminal Activity/Legal Involvement Pertinent to Current Situation/Hospitalization: No - Comment as needed  Activities of Daily Living Home Assistive Devices/Equipment: Walker (specify type) ADL Screening (condition at time of admission) Patient's cognitive ability adequate to safely complete daily  activities?: Yes Is the patient deaf or have difficulty hearing?: No Does the patient have difficulty seeing, even when wearing glasses/contacts?: No Does the patient have difficulty concentrating, remembering, or making decisions?: No Patient able to express need for assistance with ADLs?: No Does the patient have difficulty dressing or bathing?: Yes Independently performs ADLs?: No Does the patient have difficulty walking or climbing stairs?: Yes Weakness of Legs: Both Weakness of Arms/Hands: Right  Permission Sought/Granted   Permission granted to share information with : Yes, Verbal Permission Granted  Share Information with NAME: Lovena Neighbours (Sister)   [phone] (Mobile)           Emotional Assessment Appearance:: Appears stated age Attitude/Demeanor/Rapport: Engaged Affect (typically observed): Accepting Orientation: : Oriented to Self,Oriented to Place,Oriented to  Time,Oriented to Situation Alcohol / Substance Use: Not Applicable Psych Involvement: No (comment)  Admission diagnosis:  Hypokalemia [E87.6] Hip pain [M25.559] Failure to thrive in adult [R62.7] Intractable pain [R52] Right hip pain [M25.551] Patient Active Problem List   Diagnosis Date Noted  . Hypokalemia 04/10/2021  . Atrial fibrillation ([place]) 04/10/2021  . Type 2 diabetes mellitus ([place]) 04/10/2021  . Right hip pain 04/10/2021  . Hip pain 04/09/2021  . Hypoglycemia 02/04/2021  . Hypothermia 02/04/2021  . Hypotension 02/04/2021  . Atrial fibrillation, new onset ([place]) 02/04/2021  . AKI (acute kidney injury) ([place]) 02/04/2021  . Atrial fibrillation with  rapid ventricular response ([place]) 02/04/2021  . HLD (hyperlipidemia) 12/18/2010  . HYPERTENSION, BENIGN 12/18/2010  . SHORTNESS OF BREATH 12/18/2010  . OBSTRUCTIVE SLEEP APNEA 01/11/2008   PCP:  Chesley Noon, MD Pharmacy:   [place], [place] - [address] [place]  [address] Phone: [phone] Fax: [place] [address] [place] [address] Phone: [phone] Fax: [phone]     Social Determinants of Health (SDOH) Interventions    Readmission Risk Interventions No flowsheet data found.

## 2021-04-10 NOTE — NC FL2 (Signed)
[place] LEVEL OF CARE SCREENING TOOL     IDENTIFICATION  Patient Name: [name] [name] Birthdate: Jan 04, 1946 Sex: female Admission Date (Current Location): 04/09/2021  [facility] and [place] Number:  Herbalist and Address:  [facility] [place], [place] [address], [place], [place] [address]      Provider Number: [id number]  Attending Physician Name and Address:  Darliss Cheney, MD  Relative Name and Phone Number:  Lovena Neighbours (Sister)   [phone] [facility])    Current Level of Care: Hospital Recommended Level of Care: [place] Prior Approval Number:    Date Approved/Denied:   PASRR Number: [id number]  Discharge Plan: SNF    Current Diagnoses: Patient Active Problem List   Diagnosis Date Noted  . Hypokalemia 04/10/2021  . Atrial fibrillation ([place]) 04/10/2021  . Type 2 diabetes mellitus ([place]) 04/10/2021  . Right hip pain 04/10/2021  . Hip pain 04/09/2021  . Hypoglycemia 02/04/2021  . Hypothermia 02/04/2021  . Hypotension 02/04/2021  . Atrial fibrillation, new onset ([place]) 02/04/2021  . AKI (acute kidney injury) ([place]) 02/04/2021  . Atrial fibrillation with rapid ventricular response ([place]) 02/04/2021  . HLD (hyperlipidemia) 12/18/2010  . HYPERTENSION, BENIGN 12/18/2010  . SHORTNESS OF BREATH 12/18/2010  . OBSTRUCTIVE SLEEP APNEA 01/11/2008    Orientation RESPIRATION BLADDER Height & Weight     Self,Time,Situation,Place  Normal External catheter Weight: 193 lb 5.5 oz (87.7 kg) Height:  5' (152.4 cm)  BEHAVIORAL SYMPTOMS/MOOD NEUROLOGICAL BOWEL NUTRITION STATUS      Continent Diet (see d/c summary)  AMBULATORY STATUS COMMUNICATION OF NEEDS Skin   Extensive Assist Verbally Normal                       Personal Care Assistance Level of Assistance  Bathing,Feeding,Dressing Bathing Assistance: Maximum assistance Feeding assistance: Independent Dressing Assistance: Limited assistance     Functional  Limitations Info  Sight,Hearing,Speech Sight Info: Adequate Hearing Info: Adequate Speech Info: Adequate    SPECIAL CARE FACTORS FREQUENCY  PT (By licensed PT),OT (By licensed OT)     PT Frequency: 5x/week OT Frequency: 5x/week            Contractures Contractures Info: Not present    Additional Factors Info  Code Status,Allergies Code Status Info: DNR Allergies Info: Morphine And Related, ace inhibitors, codeine, Oxycodone-acetaminophen           Current Medications (04/10/2021):  This is the current hospital active medication list Current Facility-Administered Medications  Medication Dose Route Frequency Provider Last Rate Last Admin  . ALPRAZolam Duanne Moron) tablet 0.25 mg  0.25 mg Oral BID PRN Shela Leff, MD      . amiodarone (PACERONE) tablet 200 mg  200 mg Oral BID Shela Leff, MD   200 mg at 04/10/21 1005  . apixaban (ELIQUIS) tablet 5 mg  5 mg Oral BID Shela Leff, MD   5 mg at 04/10/21 1006  . atorvastatin (LIPITOR) tablet 40 mg  40 mg Oral Daily Shela Leff, MD   40 mg at 04/10/21 1006  . famotidine (PEPCID) tablet 40 mg  40 mg Oral BID Shela Leff, MD   40 mg at 04/10/21 1006  . ferrous sulfate tablet 325 mg  325 mg Oral TID WC Shela Leff, MD   325 mg at 04/10/21 1300  . FLUoxetine (PROZAC) capsule 40 mg  40 mg Oral Daily Shela Leff, MD   40 mg at 04/10/21 1006  . HYDROcodone-acetaminophen (NORCO/VICODIN) 5-325 MG per tablet  1 tablet  1 tablet Oral Q6H PRN Shela Leff, MD   1 tablet at 04/10/21 0604  . insulin aspart (novoLOG) injection 0-5 Units  0-5 Units Subcutaneous QHS Shela Leff, MD      . insulin aspart (novoLOG) injection 0-9 Units  0-9 Units Subcutaneous TID WC Shela Leff, MD      . lidocaine (LIDODERM) 5 % 1 patch  1 patch Transdermal Q24H Shela Leff, MD   1 patch at 04/09/21 1935  . magnesium oxide (MAG-OX) tablet 400 mg  400 mg Oral Daily Shela Leff, MD   400 mg at  04/10/21 1006  . methocarbamol (ROBAXIN) tablet 500 mg  500 mg Oral Q8H PRN Shela Leff, MD   500 mg at 04/10/21 0604  . multivitamin with minerals tablet 1 tablet  1 tablet Oral Daily Shela Leff, MD   1 tablet at 04/10/21 1005  . pantoprazole (PROTONIX) EC tablet 40 mg  40 mg Oral Daily Shela Leff, MD   40 mg at 04/10/21 1005  . polyethylene glycol (MIRALAX / GLYCOLAX) packet 17 g  17 g Oral Daily Shela Leff, MD   17 g at 04/10/21 1007     Discharge Medications: Please see discharge summary for a list of discharge medications.  Relevant Imaging Results:  Relevant Lab Results:   Additional Information [phone]. Patient reported that she has a CPAP, but does not use regularly. Patient has not had the COVID vaccinations  [place], LCSW

## 2021-04-10 NOTE — Progress Notes (Signed)
Notified MD again for orders. Awaiting on call back.

## 2021-04-10 NOTE — Progress Notes (Signed)
PROGRESS NOTE    [name] [name]  JME:[id number] DOB: 13-May-1946 DOA: 04/09/2021 PCP: Chesley Noon, MD   Brief Narrative:   HPI: [name] [name] is a 75 y.o. female with medical history significant of A. fib on Eliquis, type 2 diabetes, hypertension, hyperlipidemia, anxiety, anemia, obesity (BMI 37.76), GERD, sleep apnea presented to the ED with complaints of severe right hip pain for several days and inability to walk.  In the ED, vital signs stable.  She was given Tylenol, fentanyl, and Dilaudid for pain.  Lidocaine patch applied.  She was given IV potassium 10 mEq x 1.  Labs showing WBC 3.9, hemoglobin 11.0 (stable), platelet count 202K.  Sodium 136, potassium 2.9, chloride 103, bicarb 26, BUN 11, creatinine 0.7, glucose 82.  BNP 129.  UA without signs of infection.  Screening COVID test pending.  X-ray of hip showing moderate degenerative changes and no acute findings.  Patient states she has had pain in her right hip for a long time but it has been worse for the past few days.  She had a fall back in January but denies any recent falls.  Due to her pain, it is difficult for her to ambulate at home.  She lives by herself and her primary care physician is currently working on finding her a bed at an assisted living facility.  States she is seen by an orthopedics and recently had a steroid shot but continues to to have pain in this hip.  Assessment & Plan:   Principal Problem:   Hip pain Active Problems:   HLD (hyperlipidemia)   Hypokalemia   Atrial fibrillation (HCC)   Type 2 diabetes mellitus (HCC)   Acute on chronic right hip pain: According to patient, her hip pain is several month old but that has gotten worse since about 1 week.  X-ray showing moderate degenerative changes and no acute findings. She had a fall back in January but denies any recent falls.  -PT/OT and social work consulted to help with placement as patient lives alone and is having difficulty ambulating.   Norco as needed for pain, Robaxin as needed, lidocaine patch.  Hypokalemia: Still 3.0.  Will replace more today.  Recheck in the morning.  Hypomagnesemia: 1.6.  Replace.  Recheck in the morning  Permanent A. Fib: Rate controlled.  Continue amiodarone and Eliquis.  Type 2 diabetes A1c 5.4 on 03/12/2021 and 5.7 on 04/10/2021.  Continue SSI  Hyperlipidemia -Continue Lipitor  GERD -Continue PPI and H2 blocker  DVT prophylaxis:    Code Status: DNR  Family Communication: None present at bedside.  Plan of care discussed with patient in length and he verbalized understanding and agreed with it.  Status is: Observation  The patient will require care spanning > 2 midnights and should be moved to inpatient because: Unsafe d/c plan  Dispo: The patient is from: Home              Anticipated d/c is to: SNF              Patient currently is medically stable to d/c.   Difficult to place patient No        Estimated body mass index is 37.76 kg/m as calculated from the following:   Height as of this encounter: 5' (1.524 m).   Weight as of this encounter: 87.7 kg.      Nutritional status:               Consultants:  None  Procedures:   None  Antimicrobials:  Anti-infectives (From admission, onward)   None         Subjective: Seen and examined.  She was working with PT at that point in time.  She stated that her pain is better.  She further explained to me that in fact it is her knee which is the worst and that is why she has been having pain in the right hip and she has seen orthopedics and has received steroid injections in the knee.  She was walking with a walker but slowly.  According to her, this is her normal pace even at home.  Objective: Vitals:   04/09/21 2230 04/10/21 0015 04/10/21 0052 04/10/21 0319  BP: 119/71 132/64 (!) 151/75 (!) 156/71  Pulse: 82 72 87 83  Resp: 16 17 18 18   Temp:   98.4 F (36.9 C) 98.6 F (37 C)  TempSrc:   Oral Oral   SpO2: 91% 96% 99% 98%  Weight:   87.7 kg   Height:   5' (1.524 m)    No intake or output data in the 24 hours ending 04/10/21 1038 Filed Weights   04/10/21 0052  Weight: 87.7 kg    Examination:  General exam: Appears calm and comfortable, morbidly obese Respiratory system: Clear to auscultation. Respiratory effort normal. Cardiovascular system: S1 & S2 heard, RRR. No JVD, murmurs, rubs, gallops or clicks. No pedal edema. Gastrointestinal system: Abdomen is nondistended, soft and nontender. No organomegaly or masses felt. Normal bowel sounds heard. Central nervous system: Alert and oriented. No focal neurological deficits. Extremities: Symmetric 5 x 5 power. Skin: No rashes, lesions or ulcers Psychiatry: Judgement and insight appear normal. Mood & affect appropriate.    Data Reviewed: I have personally reviewed following labs and imaging studies  CBC: Recent Labs  Lab 04/09/21 1725  WBC 3.9*  NEUTROABS 1.7  HGB 11.0*  HCT 33.8*  MCV 92.9  PLT 427   Basic Metabolic Panel: Recent Labs  Lab 04/09/21 1816 04/10/21 0544 04/10/21 0827  NA 136  --  137  K 2.9*  --  3.0*  CL 103  --  104  CO2 26  --  27  GLUCOSE 82  --  79  BUN 11  --  9  CREATININE 0.72  --  0.73  CALCIUM 8.6*  --  8.6*  MG  --  1.6*  --    GFR: Estimated Creatinine Clearance: 58.9 mL/min (by C-G formula based on SCr of 0.73 mg/dL). Liver Function Tests: No results for input(s): AST, ALT, ALKPHOS, BILITOT, PROT, ALBUMIN in the last 168 hours. No results for input(s): LIPASE, AMYLASE in the last 168 hours. No results for input(s): AMMONIA in the last 168 hours. Coagulation Profile: No results for input(s): INR, PROTIME in the last 168 hours. Cardiac Enzymes: No results for input(s): CKTOTAL, CKMB, CKMBINDEX, TROPONINI in the last 168 hours. BNP (last 3 results) No results for input(s): PROBNP in the last 8760 hours. HbA1C: Recent Labs    04/10/21 0544  HGBA1C 5.7*   CBG: Recent Labs   Lab 04/10/21 0605  GLUCAP 71   Lipid Profile: No results for input(s): CHOL, HDL, LDLCALC, TRIG, CHOLHDL, LDLDIRECT in the last 72 hours. Thyroid Function Tests: No results for input(s): TSH, T4TOTAL, FREET4, T3FREE, THYROIDAB in the last 72 hours. Anemia Panel: No results for input(s): VITAMINB12, FOLATE, FERRITIN, TIBC, IRON, RETICCTPCT in the last 72 hours. Sepsis Labs: No results for input(s): PROCALCITON, LATICACIDVEN in the  last 168 hours.  Recent Results (from the past 240 hour(s))  SARS CORONAVIRUS 2 (TAT 6-24 HRS) Nasopharyngeal Nasopharyngeal Swab     Status: None   Collection Time: 04/10/21 12:02 AM   Specimen: Nasopharyngeal Swab  Result Value Ref Range Status   SARS Coronavirus 2 NEGATIVE NEGATIVE Final    Comment: (NOTE) SARS-CoV-2 target nucleic acids are NOT DETECTED.  The SARS-CoV-2 RNA is generally detectable in upper and lower respiratory specimens during the acute phase of infection. Negative results do not preclude SARS-CoV-2 infection, do not rule out co-infections with other pathogens, and should not be used as the sole basis for treatment or other patient management decisions. Negative results must be combined with clinical observations, patient history, and epidemiological information. The expected result is Negative.  Fact Sheet for Patients: SugarRoll.be  Fact Sheet for Healthcare Providers: https://www.woods-mathews.com/  This test is not yet approved or cleared by the [place] FDA and  has been authorized for detection and/or diagnosis of SARS-CoV-2 by FDA under an Emergency Use Authorization (EUA). This EUA will remain  in effect (meaning this test can be used) for the duration of the COVID-19 declaration under Se ction 564(b)(1) of the Act, 21 U.S.C. section 360bbb-3(b)(1), unless the authorization is terminated or revoked sooner.  Performed at [facility], [place] [address]  [place], [place] [address]       Radiology Studies: DG Hip Malvin Johns or Wo Pelvis 2-3 Views Right  Result Date: 04/09/2021 CLINICAL DATA:  Fall EXAM: DG HIP (WITH OR WITHOUT PELVIS) 2-3V RIGHT COMPARISON:  02/19/2021 FINDINGS: SI joints are non widened. Pubic symphysis and rami appear intact. Left hip replacement with normal alignment and intact hardware. No fracture or malalignment of the right hip. Moderate degenerative changes. IMPRESSION: 1. Status post left hip replacement. 2. No acute osseous abnormality. Electronically Signed   By: Donavan Foil M.D.   On: 04/09/2021 23:33    Scheduled Meds: . amiodarone  200 mg Oral BID  . apixaban  5 mg Oral BID  . atorvastatin  40 mg Oral Daily  . famotidine  40 mg Oral BID  . ferrous sulfate  325 mg Oral TID WC  . FLUoxetine  40 mg Oral Daily  . insulin aspart  0-5 Units Subcutaneous QHS  . insulin aspart  0-9 Units Subcutaneous TID WC  . lidocaine  1 patch Transdermal Q24H  . magnesium oxide  400 mg Oral Daily  . multivitamin with minerals  1 tablet Oral Daily  . pantoprazole  40 mg Oral Daily  . polyethylene glycol  17 g Oral Daily   Continuous Infusions: . magnesium sulfate bolus IVPB 2 g (04/10/21 1032)     LOS: 0 days   Time spent: 32 minutes   Darliss Cheney, MD [facility]  04/10/2021, 10:38 AM   How to contact the [facility] Attending or Consulting provider [place] or covering provider during after hours [place], for this patient?  1. Check the care team in [facility] and look for a) attending/consulting TRH provider listed and b) the [facility] team listed. Page or secure chat [phone]. 2. Log into [email or web address] and use [place]'s universal password to access. If you do not have the password, please contact the hospital operator. 3. Locate the [facility] provider you are looking for under [facility] and page to a number that you can be directly reached. 4. If you still have difficulty reaching the provider, please page the [facility] (Director on  Call) for  the Hospitalists listed on amion for assistance.

## 2021-04-10 NOTE — Progress Notes (Addendum)
Dr. Marlowe Sax returned paged and express awareness of patient arrival from ED to 5C20. Patient appears in no distress resting quietly at this time.

## 2021-04-11 DIAGNOSIS — M25559 Pain in unspecified hip: Secondary | ICD-10-CM | POA: Diagnosis not present

## 2021-04-11 LAB — GLUCOSE, CAPILLARY
Glucose-Capillary: 95 mg/dL (ref 70–99)
Glucose-Capillary: 95 mg/dL (ref 70–99)
Glucose-Capillary: 119 mg/dL — ABNORMAL HIGH (ref 70–99)
Glucose-Capillary: 108 mg/dL — ABNORMAL HIGH (ref 70–99)

## 2021-04-11 LAB — MAGNESIUM: Magnesium: 2 mg/dL (ref 1.7–2.4)

## 2021-04-11 LAB — BASIC METABOLIC PANEL
Anion gap: 7 (ref 5–15)
BUN: 13 mg/dL (ref 8–23)
CO2: 26 mmol/L (ref 22–32)
Calcium: 8.6 mg/dL — ABNORMAL LOW (ref 8.9–10.3)
Chloride: 104 mmol/L (ref 98–111)
Creatinine, Ser: 0.78 mg/dL (ref 0.44–1.00)
GFR, Estimated: >60 mL/min (ref >60)
Glucose, Bld: 98 mg/dL (ref 70–99)
Potassium: 4 mmol/L (ref 3.5–5.1)
Sodium: 137 mmol/L (ref 135–145)

## 2021-04-11 NOTE — TOC Progression Note (Signed)
Transition of Care [facility]) - Progression Note    Patient Details  Name: [name] [name] MRN: [id number] Date of Birth: [birth year]  Transition of Care [facility]) CM/SW [place], LCSW Phone Number: 04/11/2021, 12:45 PM  Clinical Narrative:    CSW contacted by RN to speak with patient and sister. CSW met with patient and sister and noted concerns with [place] for acceptance and inquired about [facility]. CSW will update [place] and reach out to [facility] to see if it would be possible.    Expected Discharge Plan: [place] Barriers to Discharge: Continued Medical Work up  Expected Discharge Plan and Services Expected Discharge Plan: [place] arrangements for the past 2 months: Mobile Home                                       Social Determinants of Health (SDOH) Interventions    Readmission Risk Interventions No flowsheet data found.

## 2021-04-11 NOTE — TOC Progression Note (Signed)
Transition of Care [facility]) - Progression Note    Patient Details  Name: CAMBER NINH MRN: [id number] Date of Birth: 02-06-1946  [facility] CM/SW [place], [place] Phone Number: 04/11/2021, 10:24 AM  Clinical Narrative:    CSW met with patient and informed her of bed offers from SNF for inpatient physical rehabilitation. CSW informed patient [place] and [place] both offered beds for admission and patient selected [place]. CSW confirmed with [place] at [facility] and they will begin the [place]. CSW will continue to follow at this time.    Expected Discharge Plan: [place] Barriers to Discharge: Continued Medical Work up  Expected Discharge Plan and Services Expected Discharge Plan: [place] arrangements for the past 2 months: Mobile Home                                       Social Determinants of Health (SDOH) Interventions    Readmission Risk Interventions No flowsheet data found.

## 2021-04-11 NOTE — Plan of Care (Signed)

## 2021-04-11 NOTE — Progress Notes (Signed)
PROGRESS NOTE    [name] [name]  UXL:[id number] DOB: Mar 28, 1946 DOA: 04/09/2021 PCP: Chesley Noon, MD   Brief Narrative:   [name] [name] is a 75 y.o. female with medical history significant of A. fib on Eliquis, type 2 diabetes, hypertension, hyperlipidemia, anxiety, anemia, obesity (BMI 37.76), GERD, sleep apnea presented to the ED with complaints of severe right hip pain for several days and inability to walk.  In the ED, vital signs stable. X-ray of hip showing moderate degenerative changes and no acute findings. Patient states she has had pain in her right hip for a long time but it has been worse for the past few days.  She had a fall back in January but denies any recent falls.  Due to her pain, it is difficult for her to ambulate at home.  She lives by herself and her primary care physician is currently working on finding her a bed at an assisted living facility.  States she is seen by an orthopedics and recently had a steroid shot but continues to to have pain in this hip.  Assessment & Plan:   Principal Problem:   Hip pain Active Problems:   HLD (hyperlipidemia)   Hypokalemia   Atrial fibrillation (HCC)   Type 2 diabetes mellitus (HCC)   Right hip pain   Acute on chronic right hip pain: According to patient, her hip pain is several month old but that has gotten worse since about 1 week.  X-ray showing moderate degenerative changes and no acute findings. She had a fall back in January but denies any recent falls.  -PT/OT and social work consulted to help with placement as patient lives alone and is having difficulty ambulating.  Continue current pain management.  She is pain-free now  Hypokalemia: Resolved.  Hypomagnesemia: Resolved.  Permanent A. Fib: Rate controlled.  Continue amiodarone and Eliquis.  Type 2 diabetes A1c 5.4 on 03/12/2021 and 5.7 on 04/10/2021.  Continue SSI  Hyperlipidemia -Continue Lipitor  GERD -Continue PPI and H2 blocker  DVT  prophylaxis:    Code Status: DNR  Family Communication: None present at bedside.  Plan of care discussed with patient in length and he verbalized understanding and agreed with it.  Status is: Observation  The patient will require care spanning > 2 midnights and should be moved to inpatient because: Unsafe d/c plan  Dispo: The patient is from: Home              Anticipated d/c is to: SNF              Patient currently is medically stable to d/c.   Difficult to place patient No        Estimated body mass index is 37.76 kg/m as calculated from the following:   Height as of this encounter: 5' (1.524 m).   Weight as of this encounter: 87.7 kg.      Nutritional status:               Consultants:   None  Procedures:   None  Antimicrobials:  Anti-infectives (From admission, onward)   None         Subjective: Seen and examined.  Feels better.  Pain better controlled today  Objective: Vitals:   04/10/21 1235 04/10/21 1844 04/11/21 0106 04/11/21 0524  BP: 106/67 (!) 101/46 126/63 122/67  Pulse: 87 69 71 74  Resp: 16 16 18 18   Temp: 98.6 F (37 C) 98.5 F (36.9 C) 98.4 F (36.9  C) 97.8 F (36.6 C)  TempSrc: Oral Oral Oral Oral  SpO2: 97% 93% 95% 98%  Weight:      Height:        Intake/Output Summary (Last 24 hours) at 04/11/2021 1148 Last data filed at 04/11/2021 0651 Gross per 24 hour  Intake 480 ml  Output 651 ml  Net -171 ml   Filed Weights   04/10/21 0052  Weight: 87.7 kg    Examination:  General exam: Appears calm and comfortable, morbidly Respiratory system: Clear to auscultation. Respiratory effort normal. Cardiovascular system: S1 & S2 heard, RRR. No JVD, murmurs, rubs, gallops or clicks. No pedal edema. Gastrointestinal system: Abdomen is nondistended, soft and nontender. No organomegaly or masses felt. Normal bowel sounds heard. Central nervous system: Alert and oriented. No focal neurological deficits. Extremities: Symmetric 5  x 5 power. Skin: No rashes, lesions or ulcers.  Psychiatry: Judgement and insight appear normal. Mood & affect appropriate.    Data Reviewed: I have personally reviewed following labs and imaging studies  CBC: Recent Labs  Lab 04/09/21 1725  WBC 3.9*  NEUTROABS 1.7  HGB 11.0*  HCT 33.8*  MCV 92.9  PLT 622   Basic Metabolic Panel: Recent Labs  Lab 04/09/21 1816 04/10/21 0544 04/10/21 0827 04/11/21 0459  NA 136  --  137 137  K 2.9*  --  3.0* 4.0  CL 103  --  104 104  CO2 26  --  27 26  GLUCOSE 82  --  79 98  BUN 11  --  9 13  CREATININE 0.72  --  0.73 0.78  CALCIUM 8.6*  --  8.6* 8.6*  MG  --  1.6*  --  2.0   GFR: Estimated Creatinine Clearance: 58.9 mL/min (by C-G formula based on SCr of 0.78 mg/dL). Liver Function Tests: No results for input(s): AST, ALT, ALKPHOS, BILITOT, PROT, ALBUMIN in the last 168 hours. No results for input(s): LIPASE, AMYLASE in the last 168 hours. No results for input(s): AMMONIA in the last 168 hours. Coagulation Profile: No results for input(s): INR, PROTIME in the last 168 hours. Cardiac Enzymes: No results for input(s): CKTOTAL, CKMB, CKMBINDEX, TROPONINI in the last 168 hours. BNP (last 3 results) No results for input(s): PROBNP in the last 8760 hours. HbA1C: Recent Labs    04/10/21 0544  HGBA1C 5.7*   CBG: Recent Labs  Lab 04/10/21 1112 04/10/21 1607 04/10/21 2114 04/11/21 0551 04/11/21 1115  GLUCAP 96 109* 115* 95 95   Lipid Profile: No results for input(s): CHOL, HDL, LDLCALC, TRIG, CHOLHDL, LDLDIRECT in the last 72 hours. Thyroid Function Tests: No results for input(s): TSH, T4TOTAL, FREET4, T3FREE, THYROIDAB in the last 72 hours. Anemia Panel: No results for input(s): VITAMINB12, FOLATE, FERRITIN, TIBC, IRON, RETICCTPCT in the last 72 hours. Sepsis Labs: No results for input(s): PROCALCITON, LATICACIDVEN in the last 168 hours.  Recent Results (from the past 240 hour(s))  SARS CORONAVIRUS 2 (TAT 6-24 HRS)  Nasopharyngeal Nasopharyngeal Swab     Status: None   Collection Time: 04/10/21 12:02 AM   Specimen: Nasopharyngeal Swab  Result Value Ref Range Status   SARS Coronavirus 2 NEGATIVE NEGATIVE Final    Comment: (NOTE) SARS-CoV-2 target nucleic acids are NOT DETECTED.  The SARS-CoV-2 RNA is generally detectable in upper and lower respiratory specimens during the acute phase of infection. Negative results do not preclude SARS-CoV-2 infection, do not rule out co-infections with other pathogens, and should not be used as the sole basis for treatment  or other patient management decisions. Negative results must be combined with clinical observations, patient history, and epidemiological information. The expected result is Negative.  Fact Sheet for Patients: SugarRoll.be  Fact Sheet for Healthcare Providers: https://www.woods-mathews.com/  This test is not yet approved or cleared by the [place] FDA and  has been authorized for detection and/or diagnosis of SARS-CoV-2 by FDA under an Emergency Use Authorization (EUA). This EUA will remain  in effect (meaning this test can be used) for the duration of the COVID-19 declaration under Se ction 564(b)(1) of the Act, 21 U.S.C. section 360bbb-3(b)(1), unless the authorization is terminated or revoked sooner.  Performed at [facility], [place] [address], [place], [place] [address]       Radiology Studies: DG Hip Malvin Johns or Wo Pelvis 2-3 Views Right  Result Date: 04/09/2021 CLINICAL DATA:  Fall EXAM: DG HIP (WITH OR WITHOUT PELVIS) 2-3V RIGHT COMPARISON:  02/19/2021 FINDINGS: SI joints are non widened. Pubic symphysis and rami appear intact. Left hip replacement with normal alignment and intact hardware. No fracture or malalignment of the right hip. Moderate degenerative changes. IMPRESSION: 1. Status post left hip replacement. 2. No acute osseous abnormality. Electronically Signed   By: Donavan Foil M.D.   On: 04/09/2021 23:33    Scheduled Meds: . amiodarone  200 mg Oral BID  . apixaban  5 mg Oral BID  . atorvastatin  40 mg Oral Daily  . famotidine  40 mg Oral BID  . ferrous sulfate  325 mg Oral TID WC  . FLUoxetine  40 mg Oral Daily  . insulin aspart  0-5 Units Subcutaneous QHS  . insulin aspart  0-9 Units Subcutaneous TID WC  . lidocaine  1 patch Transdermal Q24H  . magnesium oxide  400 mg Oral Daily  . multivitamin with minerals  1 tablet Oral Daily  . pantoprazole  40 mg Oral Daily  . polyethylene glycol  17 g Oral Daily   Continuous Infusions:    LOS: 1 day   Time spent: 28 minutes   Darliss Cheney, MD [facility]  04/11/2021, 11:48 AM   How to contact the [facility] Attending or Consulting provider [place] or covering provider during after hours [place], for this patient?  1. Check the care team in [facility] and look for a) attending/consulting TRH provider listed and b) the [facility] team listed. Page or secure chat [phone]. 2. Log into [email or web address] and use [place]'s universal password to access. If you do not have the password, please contact the hospital operator. 3. Locate the [facility] provider you are looking for under [facility] and page to a number that you can be directly reached. 4. If you still have difficulty reaching the provider, please page the [facility] (Director on Call) for the Hospitalists listed on amion for assistance.

## 2021-04-12 DIAGNOSIS — M25559 Pain in unspecified hip: Secondary | ICD-10-CM | POA: Diagnosis not present

## 2021-04-12 LAB — GLUCOSE, CAPILLARY
Glucose-Capillary: 98 mg/dL (ref 70–99)
Glucose-Capillary: 100 mg/dL — ABNORMAL HIGH (ref 70–99)
Glucose-Capillary: 95 mg/dL (ref 70–99)
Glucose-Capillary: 88 mg/dL (ref 70–99)

## 2021-04-12 NOTE — Progress Notes (Signed)
PROGRESS NOTE    [name] [name]  ZOX:[id number] DOB: [birth year] DOA: 04/09/2021 PCP: Chesley Noon, MD   Brief Narrative:   [name] [name] is a 75 y.o. female with medical history significant of A. fib on Eliquis, type 2 diabetes, hypertension, hyperlipidemia, anxiety, anemia, obesity (BMI 37.76), GERD, sleep apnea presented to the ED with complaints of severe right hip pain for several days and inability to walk.  In the ED, vital signs stable. X-ray of hip showing moderate degenerative changes and no acute findings. Patient states she has had pain in her right hip for a long time but it has been worse for the past few days.  She had a fall back in January but denies any recent falls.  Due to her pain, it is difficult for her to ambulate at home.  She lives by herself and her primary care physician is currently working on finding her a bed at an assisted living facility.  States she is seen by an orthopedics and recently had a steroid shot but continues to to have pain in this hip.  Assessment & Plan:   Principal Problem:   Hip pain Active Problems:   HLD (hyperlipidemia)   Hypokalemia   Atrial fibrillation (HCC)   Type 2 diabetes mellitus (HCC)   Right hip pain   Acute on chronic right hip pain: According to patient, her hip pain is several month old but that has gotten worse since about 1 week.  X-ray showing moderate degenerative changes and no acute findings. She had a fall back in January but denies any recent falls.  -PT/OT and social work consulted to help with placement as patient lives alone and is having difficulty ambulating.  Continue current pain management.  She is pain-free now  Hypokalemia: Resolved.  Hypomagnesemia: Resolved.  Permanent A. Fib: Rate controlled.  Continue amiodarone and Eliquis.  Type 2 diabetes A1c 5.4 on 03/12/2021 and 5.7 on 04/10/2021.  Continue SSI  Hyperlipidemia -Continue Lipitor  GERD -Continue PPI and H2 blocker  DVT  prophylaxis:    Code Status: DNR  Family Communication: None present at bedside.  Plan of care discussed with patient in length and he verbalized understanding and agreed with it.  Status is: Observation  The patient will require care spanning > 2 midnights and should be moved to inpatient because: Unsafe d/c plan  Dispo: The patient is from: Home              Anticipated d/c is to: SNF              Patient currently is medically stable to d/c.   Difficult to place patient No        Estimated body mass index is 37.76 kg/m as calculated from the following:   Height as of this encounter: 5' (1.524 m).   Weight as of this encounter: 87.7 kg.      Nutritional status:               Consultants:   None  Procedures:   None  Antimicrobials:  Anti-infectives (From admission, onward)   None         Subjective: Seen and examined.  No complaints.  Awaiting placement to SNF.  Objective: Vitals:   04/11/21 1731 04/11/21 2039 04/12/21 0457 04/12/21 1300  BP: (!) 92/52 108/65 115/71 106/68  Pulse: 77 77 67 63  Resp: 16 18 18 15   Temp: 98.5 F (36.9 C) 98.5 F (36.9 C) 98.1 F (36.7  C) 97.7 F (36.5 C)  TempSrc: Oral Oral Oral Oral  SpO2: 96% 99% 95% 93%  Weight:      Height:        Intake/Output Summary (Last 24 hours) at 04/12/2021 1328 Last data filed at 04/12/2021 1300 Gross per 24 hour  Intake 840 ml  Output 500 ml  Net 340 ml   Filed Weights   04/10/21 0052  Weight: 87.7 kg    Examination:  General exam: Appears calm and comfortable, morbidly obese Respiratory system: Clear to auscultation. Respiratory effort normal. Cardiovascular system: S1 & S2 heard, RRR. No JVD, murmurs, rubs, gallops or clicks. No pedal edema. Gastrointestinal system: Abdomen is nondistended, soft and nontender. No organomegaly or masses felt. Normal bowel sounds heard. Central nervous system: Alert and oriented. No focal neurological deficits. Extremities:  Symmetric 5 x 5 power. Skin: No rashes, lesions or ulcers.  Psychiatry: Judgement and insight appear normal. Mood & affect appropriate.    Data Reviewed: I have personally reviewed following labs and imaging studies  CBC: Recent Labs  Lab 04/09/21 1725  WBC 3.9*  NEUTROABS 1.7  HGB 11.0*  HCT 33.8*  MCV 92.9  PLT 010   Basic Metabolic Panel: Recent Labs  Lab 04/09/21 1816 04/10/21 0544 04/10/21 0827 04/11/21 0459  NA 136  --  137 137  K 2.9*  --  3.0* 4.0  CL 103  --  104 104  CO2 26  --  27 26  GLUCOSE 82  --  79 98  BUN 11  --  9 13  CREATININE 0.72  --  0.73 0.78  CALCIUM 8.6*  --  8.6* 8.6*  MG  --  1.6*  --  2.0   GFR: Estimated Creatinine Clearance: 58.9 mL/min (by C-G formula based on SCr of 0.78 mg/dL). Liver Function Tests: No results for input(s): AST, ALT, ALKPHOS, BILITOT, PROT, ALBUMIN in the last 168 hours. No results for input(s): LIPASE, AMYLASE in the last 168 hours. No results for input(s): AMMONIA in the last 168 hours. Coagulation Profile: No results for input(s): INR, PROTIME in the last 168 hours. Cardiac Enzymes: No results for input(s): CKTOTAL, CKMB, CKMBINDEX, TROPONINI in the last 168 hours. BNP (last 3 results) No results for input(s): PROBNP in the last 8760 hours. HbA1C: Recent Labs    04/10/21 0544  HGBA1C 5.7*   CBG: Recent Labs  Lab 04/11/21 1115 04/11/21 1607 04/11/21 2135 04/12/21 0613 04/12/21 1031  GLUCAP 95 119* 108* 98 100*   Lipid Profile: No results for input(s): CHOL, HDL, LDLCALC, TRIG, CHOLHDL, LDLDIRECT in the last 72 hours. Thyroid Function Tests: No results for input(s): TSH, T4TOTAL, FREET4, T3FREE, THYROIDAB in the last 72 hours. Anemia Panel: No results for input(s): VITAMINB12, FOLATE, FERRITIN, TIBC, IRON, RETICCTPCT in the last 72 hours. Sepsis Labs: No results for input(s): PROCALCITON, LATICACIDVEN in the last 168 hours.  Recent Results (from the past 240 hour(s))  SARS CORONAVIRUS 2 (TAT  6-24 HRS) Nasopharyngeal Nasopharyngeal Swab     Status: None   Collection Time: 04/10/21 12:02 AM   Specimen: Nasopharyngeal Swab  Result Value Ref Range Status   SARS Coronavirus 2 NEGATIVE NEGATIVE Final    Comment: (NOTE) SARS-CoV-2 target nucleic acids are NOT DETECTED.  The SARS-CoV-2 RNA is generally detectable in upper and lower respiratory specimens during the acute phase of infection. Negative results do not preclude SARS-CoV-2 infection, do not rule out co-infections with other pathogens, and should not be used as the sole basis for  treatment or other patient management decisions. Negative results must be combined with clinical observations, patient history, and epidemiological information. The expected result is Negative.  Fact Sheet for Patients: SugarRoll.be  Fact Sheet for Healthcare Providers: https://www.woods-mathews.com/  This test is not yet approved or cleared by the [place] FDA and  has been authorized for detection and/or diagnosis of SARS-CoV-2 by FDA under an Emergency Use Authorization (EUA). This EUA will remain  in effect (meaning this test can be used) for the duration of the COVID-19 declaration under Se ction 564(b)(1) of the Act, 21 U.S.C. section 360bbb-3(b)(1), unless the authorization is terminated or revoked sooner.  Performed at [facility], [place] [address], [place], [place] [address]       Radiology Studies: No results found.  Scheduled Meds: . amiodarone  200 mg Oral BID  . apixaban  5 mg Oral BID  . atorvastatin  40 mg Oral Daily  . famotidine  40 mg Oral BID  . ferrous sulfate  325 mg Oral TID WC  . FLUoxetine  40 mg Oral Daily  . insulin aspart  0-5 Units Subcutaneous QHS  . insulin aspart  0-9 Units Subcutaneous TID WC  . lidocaine  1 patch Transdermal Q24H  . magnesium oxide  400 mg Oral Daily  . multivitamin with minerals  1 tablet Oral Daily  . pantoprazole  40 mg Oral  Daily  . polyethylene glycol  17 g Oral Daily   Continuous Infusions:    LOS: 2 days   Time spent: 23 minutes   Darliss Cheney, MD [facility]  04/12/2021, 1:28 PM   How to contact the [facility] Attending or Consulting provider [place] or covering provider during after hours [place], for this patient?  1. Check the care team in [facility] and look for a) attending/consulting TRH provider listed and b) the [facility] team listed. Page or secure chat [phone]. 2. Log into [email or web address] and use [place]'s universal password to access. If you do not have the password, please contact the hospital operator. 3. Locate the [facility] provider you are looking for under [facility] and page to a number that you can be directly reached. 4. If you still have difficulty reaching the provider, please page the [facility] (Director on Call) for the Hospitalists listed on amion for assistance.

## 2021-04-13 DIAGNOSIS — M25559 Pain in unspecified hip: Secondary | ICD-10-CM | POA: Diagnosis not present

## 2021-04-13 LAB — GLUCOSE, CAPILLARY
Glucose-Capillary: 99 mg/dL (ref 70–99)
Glucose-Capillary: 124 mg/dL — ABNORMAL HIGH (ref 70–99)
Glucose-Capillary: 120 mg/dL — ABNORMAL HIGH (ref 70–99)
Glucose-Capillary: 142 mg/dL — ABNORMAL HIGH (ref 70–99)

## 2021-04-13 MED ORDER — ONDANSETRON HCL 4 MG/2ML IJ SOLN: 4.0000 mg | Freq: Four times a day (QID) | INTRAMUSCULAR | Status: DC | PRN

## 2021-04-13 NOTE — Care Management Important Message (Signed)
Important Message  Patient Details  Name: [name] [name] MRN: [id number] Date of Birth: Nov 25, 1946   Medicare Important Message Given:  Yes     [place] 04/13/2021, 2:07 PM

## 2021-04-13 NOTE — TOC Progression Note (Addendum)
Transition of Care [facility]) - Progression Note    Patient Details  Name: [name] [name] MRN: [id number] Date of Birth: 04/01/1946  Transition of Care [facility]) CM/SW [place], [place] Phone Number: 04/13/2021, 4:13 PM  Clinical Narrative:     CSW sent another message to [facility]- waiting on response. CSW called , no answer   Thurmond Butts, MSW, LCSW Clinical Social Worker    Expected Discharge Plan: Skilled Nursing Facility Barriers to Discharge: Continued Medical Work up  Expected Discharge Plan and Services Expected Discharge Plan: [place] arrangements for the past 2 months: Mobile Home                                       Social Determinants of Health (SDOH) Interventions    Readmission Risk Interventions No flowsheet data found.

## 2021-04-13 NOTE — Progress Notes (Signed)
PROGRESS NOTE    [name] [name]  PYK:[id number] DOB: September 29, 1946 DOA: 04/09/2021 PCP: Chesley Noon, MD   Brief Narrative:   [name] [name] is a 75 y.o. female with medical history significant of A. fib on Eliquis, type 2 diabetes, hypertension, hyperlipidemia, anxiety, anemia, obesity (BMI 37.76), GERD, sleep apnea presented to the ED with complaints of severe right hip pain for several days and inability to walk.  In the ED, vital signs stable. X-ray of hip showing moderate degenerative changes and no acute findings. Patient states she has had pain in her right hip for a long time but it has been worse for the past few days.  She had a fall back in January but denies any recent falls.  Due to her pain, it is difficult for her to ambulate at home.  She lives by herself and her primary care physician is currently working on finding her a bed at an assisted living facility.  States she is seen by an orthopedics and recently had a steroid shot but continues to to have pain in this hip.  Assessment & Plan:   Principal Problem:   Hip pain Active Problems:   HLD (hyperlipidemia)   Hypokalemia   Atrial fibrillation (HCC)   Type 2 diabetes mellitus (HCC)   Right hip pain   Acute on chronic right hip pain: According to patient, her hip pain is several month old but that has gotten worse since about 1 week.  X-ray showing moderate degenerative changes and no acute findings. She had a fall back in January but denies any recent falls.  -PT/OT and social work consulted to help with placement as patient lives alone and is having difficulty ambulating.  Continue current pain management.  She is pain-free now  Nausea/vomiting: She tells me that she threw up her breakfast this morning.  Currently she did not have any symptoms.  Will prescribe Zofran as needed.  Hypokalemia: Resolved.  Hypomagnesemia: Resolved.  Permanent A. Fib: Rate controlled.  Continue amiodarone and Eliquis.  Type 2  diabetes A1c 5.4 on 03/12/2021 and 5.7 on 04/10/2021.  Continue SSI  Hyperlipidemia -Continue Lipitor  GERD -Continue PPI and H2 blocker  DVT prophylaxis:    Code Status: DNR  Family Communication: Sister present at bedside.  Status is: Inpatient  Remains inpatient appropriate because:Inpatient level of care appropriate due to severity of illness   Dispo: The patient is from: Home              Anticipated d/c is to: SNF              Patient currently is medically stable to d/c.   Difficult to place patient No          Estimated body mass index is 37.76 kg/m as calculated from the following:   Height as of this encounter: 5' (1.524 m).   Weight as of this encounter: 87.7 kg.      Nutritional status:               Consultants:   None  Procedures:   None  Antimicrobials:  Anti-infectives (From admission, onward)   None         Subjective: Seen and examined.  Complained of vomiting after breakfast this morning but no symptoms currently.  Sister at the bedside.  No other complaint.  Awaiting SNF placement.  Objective: Vitals:   04/12/21 1300 04/12/21 1555 04/13/21 0020 04/13/21 0456  BP: 106/68 (!) 114/57 100/61 122/62  Pulse: 63 69 72 62  Resp: 15 17 18 18   Temp: 97.7 F (36.5 C) 98.5 F (36.9 C) 98.4 F (36.9 C) 97.8 F (36.6 C)  TempSrc: Oral Oral Oral Oral  SpO2: 93% 98% 99% 98%  Weight:      Height:        Intake/Output Summary (Last 24 hours) at 04/13/2021 1025 Last data filed at 04/13/2021 0459 Gross per 24 hour  Intake 360 ml  Output 1500 ml  Net -1140 ml   Filed Weights   04/10/21 0052  Weight: 87.7 kg    Examination:  General exam: Appears calm and comfortable, morbidly obese Respiratory system: Clear to auscultation. Respiratory effort normal. Cardiovascular system: S1 & S2 heard, RRR. No JVD, murmurs, rubs, gallops or clicks. No pedal edema. Gastrointestinal system: Abdomen is nondistended, soft and  nontender. No organomegaly or masses felt. Normal bowel sounds heard. Central nervous system: Alert and oriented. No focal neurological deficits. Extremities: Symmetric 5 x 5 power. Skin: No rashes, lesions or ulcers.  Psychiatry: Judgement and insight appear normal. Mood & affect appropriate.   Data Reviewed: I have personally reviewed following labs and imaging studies  CBC: Recent Labs  Lab 04/09/21 1725  WBC 3.9*  NEUTROABS 1.7  HGB 11.0*  HCT 33.8*  MCV 92.9  PLT 810   Basic Metabolic Panel: Recent Labs  Lab 04/09/21 1816 04/10/21 0544 04/10/21 0827 04/11/21 0459  NA 136  --  137 137  K 2.9*  --  3.0* 4.0  CL 103  --  104 104  CO2 26  --  27 26  GLUCOSE 82  --  79 98  BUN 11  --  9 13  CREATININE 0.72  --  0.73 0.78  CALCIUM 8.6*  --  8.6* 8.6*  MG  --  1.6*  --  2.0   GFR: Estimated Creatinine Clearance: 58.9 mL/min (by C-G formula based on SCr of 0.78 mg/dL). Liver Function Tests: No results for input(s): AST, ALT, ALKPHOS, BILITOT, PROT, ALBUMIN in the last 168 hours. No results for input(s): LIPASE, AMYLASE in the last 168 hours. No results for input(s): AMMONIA in the last 168 hours. Coagulation Profile: No results for input(s): INR, PROTIME in the last 168 hours. Cardiac Enzymes: No results for input(s): CKTOTAL, CKMB, CKMBINDEX, TROPONINI in the last 168 hours. BNP (last 3 results) No results for input(s): PROBNP in the last 8760 hours. HbA1C: No results for input(s): HGBA1C in the last 72 hours. CBG: Recent Labs  Lab 04/12/21 0613 04/12/21 1031 04/12/21 1552 04/12/21 2111 04/13/21 0615  GLUCAP 98 100* 95 88 99   Lipid Profile: No results for input(s): CHOL, HDL, LDLCALC, TRIG, CHOLHDL, LDLDIRECT in the last 72 hours. Thyroid Function Tests: No results for input(s): TSH, T4TOTAL, FREET4, T3FREE, THYROIDAB in the last 72 hours. Anemia Panel: No results for input(s): VITAMINB12, FOLATE, FERRITIN, TIBC, IRON, RETICCTPCT in the last 72  hours. Sepsis Labs: No results for input(s): PROCALCITON, LATICACIDVEN in the last 168 hours.  Recent Results (from the past 240 hour(s))  SARS CORONAVIRUS 2 (TAT 6-24 HRS) Nasopharyngeal Nasopharyngeal Swab     Status: None   Collection Time: 04/10/21 12:02 AM   Specimen: Nasopharyngeal Swab  Result Value Ref Range Status   SARS Coronavirus 2 NEGATIVE NEGATIVE Final    Comment: (NOTE) SARS-CoV-2 target nucleic acids are NOT DETECTED.  The SARS-CoV-2 RNA is generally detectable in upper and lower respiratory specimens during the acute phase of infection. Negative results do not  preclude SARS-CoV-2 infection, do not rule out co-infections with other pathogens, and should not be used as the sole basis for treatment or other patient management decisions. Negative results must be combined with clinical observations, patient history, and epidemiological information. The expected result is Negative.  Fact Sheet for Patients: SugarRoll.be  Fact Sheet for Healthcare Providers: https://www.woods-mathews.com/  This test is not yet approved or cleared by the [place] FDA and  has been authorized for detection and/or diagnosis of SARS-CoV-2 by FDA under an Emergency Use Authorization (EUA). This EUA will remain  in effect (meaning this test can be used) for the duration of the COVID-19 declaration under Se ction 564(b)(1) of the Act, 21 U.S.C. section 360bbb-3(b)(1), unless the authorization is terminated or revoked sooner.  Performed at [facility], [place] [address], [place], [place] [address]       Radiology Studies: No results found.  Scheduled Meds: . amiodarone  200 mg Oral BID  . apixaban  5 mg Oral BID  . atorvastatin  40 mg Oral Daily  . famotidine  40 mg Oral BID  . ferrous sulfate  325 mg Oral TID WC  . FLUoxetine  40 mg Oral Daily  . insulin aspart  0-5 Units Subcutaneous QHS  . insulin aspart  0-9 Units  Subcutaneous TID WC  . lidocaine  1 patch Transdermal Q24H  . magnesium oxide  400 mg Oral Daily  . multivitamin with minerals  1 tablet Oral Daily  . pantoprazole  40 mg Oral Daily  . polyethylene glycol  17 g Oral Daily   Continuous Infusions:    LOS: 3 days   Time spent: 25 minutes   Darliss Cheney, MD [facility]  04/13/2021, 10:25 AM   How to contact the [facility] Attending or Consulting provider [place] or covering provider during after hours [place], for this patient?  1. Check the care team in [facility] and look for a) attending/consulting TRH provider listed and b) the [facility] team listed. Page or secure chat [phone]. 2. Log into [email or web address] and use [place]'s universal password to access. If you do not have the password, please contact the hospital operator. 3. Locate the [facility] provider you are looking for under [facility] and page to a number that you can be directly reached. 4. If you still have difficulty reaching the provider, please page the [facility] (Director on Call) for the Hospitalists listed on amion for assistance.

## 2021-04-13 NOTE — TOC Progression Note (Signed)
Transition of Care [facility]) - Progression Note    Patient Details  Name: [name] [name] MRN: [id number] Date of Birth: 07/25/1946  Transition [facility]) CM/SW [place], [place] Phone Number: 04/13/2021, 4:31 PM  Clinical Narrative:     Received call- [facility] will review today.   TOC will continue to follow and assist with discharge planning.  Thurmond Butts, MSW, LCSW Clinical Social Worker'   Expected Discharge Plan: [place] Barriers to Discharge: Continued Medical Work up  Expected Discharge Plan and Services Expected Discharge Plan: [place] arrangements for the past 2 months: Mobile Home                                       Social Determinants of Health (SDOH) Interventions    Readmission Risk Interventions No flowsheet data found.

## 2021-04-13 NOTE — TOC Progression Note (Signed)
Transition of Care [facility]) - Progression Note    Patient Details  Name: [name] [name] MRN: [id number] Date of Birth: [birth year]  Transition of Care [facility] CM/SW [place], [place] Phone Number: 04/13/2021, 1:44 PM  Clinical Narrative:     CSW spoke with patient's sister,[name]- she clarified, she is interested in [place] contacted [facility] but insurance is not in network. CSW left voice message with [facility] Admission Coordinator - waiting on response CSW contacted [place] to provide update-left voice message   CSW will continue to follow and assist with discharge planning.  Expected Discharge Plan: [place] Barriers to Discharge: Continued Medical Work up  Expected Discharge Plan and Services Expected Discharge Plan: [place] arrangements for the past 2 months: Mobile Home                                       Social Determinants of Health (SDOH) Interventions    Readmission Risk Interventions No flowsheet data found.

## 2021-04-14 DIAGNOSIS — M25559 Pain in unspecified hip: Secondary | ICD-10-CM | POA: Diagnosis not present

## 2021-04-14 LAB — BASIC METABOLIC PANEL
Anion gap: 7 (ref 5–15)
BUN: 15 mg/dL (ref 8–23)
CO2: 27 mmol/L (ref 22–32)
Calcium: 8.6 mg/dL — ABNORMAL LOW (ref 8.9–10.3)
Chloride: 104 mmol/L (ref 98–111)
Creatinine, Ser: 0.93 mg/dL (ref 0.44–1.00)
GFR, Estimated: >60 mL/min (ref >60)
Glucose, Bld: 107 mg/dL — ABNORMAL HIGH (ref 70–99)
Potassium: 3.7 mmol/L (ref 3.5–5.1)
Sodium: 138 mmol/L (ref 135–145)

## 2021-04-14 LAB — GLUCOSE, CAPILLARY
Glucose-Capillary: 107 mg/dL — ABNORMAL HIGH (ref 70–99)
Glucose-Capillary: 106 mg/dL — ABNORMAL HIGH (ref 70–99)
Glucose-Capillary: 101 mg/dL — ABNORMAL HIGH (ref 70–99)
Glucose-Capillary: 104 mg/dL — ABNORMAL HIGH (ref 70–99)

## 2021-04-14 LAB — MAGNESIUM: Magnesium: 1.6 mg/dL — ABNORMAL LOW (ref 1.7–2.4)

## 2021-04-14 MED ORDER — POTASSIUM CHLORIDE CRYS ER 20 MEQ PO TBCR
40.0000 meq | EXTENDED_RELEASE_TABLET | Freq: Once | ORAL | Status: AC
  Administered 2021-04-14: 40 meq via ORAL
  Filled 2021-04-14: qty 2

## 2021-04-14 MED ORDER — MAGNESIUM SULFATE 2 GM/50ML IV SOLN
2.0000 g | Freq: Once | INTRAVENOUS | Status: AC
  Administered 2021-04-14: 2 g via INTRAVENOUS
  Filled 2021-04-14: qty 50

## 2021-04-14 NOTE — Progress Notes (Signed)
Occupational Therapy Treatment Patient Details Name: [name] [name] MRN: [id number] DOB: Jul 15, 1946 Today's Date: 04/14/2021    History of present illness 75 y.o. female presented to the ED 04/09/21 with complaints of severe right hip pain for several days and inability to walk.  X-ray of hip showing moderate degenerative changes and no acute findings Dx: acute on chronic hip pain.   PMH A. fib on Eliquis, type 2 diabetes, hypertension, hyperlipidemia, anxiety, anemia, obesity (BMI 37.76), GERD, sleep apnea   OT comments  Pt met OT goals and goals have been updated. Pt received sitting in recliner with her sister present. Pt currently requires minguard assistance for grooming while standing at sink level and minguard for functional mobility at RW level. Pt will continue to benefit from skilled OT services to maximize safety and independence with ADL/IADL and functional mobility. Will continue to follow acutely and progress as tolerated.    Follow Up Recommendations  SNF    Equipment Recommendations  Other (comment) (defer to next venue)    Recommendations for Other Services      Precautions / Restrictions Precautions Precautions: Fall Precaution Comments: Had an episode of several falls last December Restrictions Weight Bearing Restrictions: No       Mobility Bed Mobility Overal bed mobility: Needs Assistance Bed Mobility: Supine to Sit     Supine to sit: Min assist     General bed mobility comments: pt sitting in recliner upon arrival, returned pt to recliner    Transfers Overall transfer level: Needs assistance Equipment used: Rolling walker (2 wheeled) Transfers: Sit to/from Stand Sit to Stand: Min guard         General transfer comment: min guard for safety, cuing for hand placement for power up able to self steady with RW    Balance Overall balance assessment: Needs assistance Sitting-balance support: No upper extremity supported;Feet supported Sitting  balance-Leahy Scale: Fair     Standing balance support: During functional activity;Bilateral upper extremity supported Standing balance-Leahy Scale: Fair Standing balance comment: able to static stand at sink to wash her hands with no UE support, no loss of balance noted minguard assistance from therapist                           ADL either performed or assessed with clinical judgement   ADL Overall ADL's : Needs assistance/impaired Eating/Feeding: Set up;Sitting   Grooming: Min guard;Standing Grooming Details (indicate cue type and reason): washed hands at sink level             Lower Body Dressing: Minimal assistance;Sit to/from stand Lower Body Dressing Details (indicate cue type and reason): minA to access feet, minguard to stand Toilet Transfer: Min Marine scientist Details (indicate cue type and reason): simulated in room         Functional mobility during ADLs: Min guard;Rolling walker General ADL Comments: minguard for safety and stability pt utilizing RW throughout session     Vision       Perception     Praxis      Cognition Arousal/Alertness: Awake/alert Behavior During Therapy: WFL for tasks assessed/performed Overall Cognitive Status: Within Functional Limits for tasks assessed                                 General Comments: anticipate pt is at baseline        Exercises Exercises: General Lower  Extremity General Exercises - Lower Extremity Ankle Circles/Pumps: AROM;Both;10 reps;Supine Quad Sets: AROM;Both;10 reps;Supine Gluteal Sets: AROM;Both;10 reps;Supine Long Arc Quad: AROM;Both;10 reps;Seated Heel Slides: AAROM;Both;10 reps;Supine   Shoulder Instructions       General Comments VSS on RA    Pertinent Vitals/ Pain       Pain Assessment: Faces Faces Pain Scale: Hurts little more Pain Location: R hip Pain Descriptors / Indicators: Discomfort;Grimacing Pain Intervention(s): Limited  activity within patient's tolerance;Monitored during session  Home Living                                          Prior Functioning/Environment              Frequency  Min 2X/week        Progress Toward Goals  OT Goals(current goals can now be found in the care plan section)  Progress towards OT goals: Goals met and updated - see care plan  Acute Rehab OT Goals Patient Stated Goal: find a SNF OT Goal Formulation: With patient Time For Goal Achievement: 04/24/21 Potential to Achieve Goals: Good ADL Goals Pt Will Perform Grooming: standing;with set-up Pt Will Perform Upper Body Dressing: with set-up;sitting Pt Will Perform Lower Body Dressing: with set-up;sit to/from stand Pt Will Transfer to Toilet: with set-up;ambulating;bedside commode Pt Will Perform Toileting - Clothing Manipulation and hygiene: with set-up;sit to/from stand  Plan Discharge plan remains appropriate    Co-evaluation                 AM-PAC OT "6 Clicks" Daily Activity     Outcome Measure   Help from another person eating meals?: A Little Help from another person taking care of personal grooming?: A Little Help from another person toileting, which includes using toliet, bedpan, or urinal?: A Little Help from another person bathing (including washing, rinsing, drying)?: A Lot Help from another person to put on and taking off regular upper body clothing?: A Little Help from another person to put on and taking off regular lower body clothing?: A Little 6 Click Score: 17    End of Session Equipment Utilized During Treatment: Rolling walker;Gait belt  OT Visit Diagnosis: Unsteadiness on feet (R26.81);Other abnormalities of gait and mobility (R26.89);Muscle weakness (generalized) (M62.81);Pain Pain - Right/Left: Right Pain - part of body: Hip   Activity Tolerance Patient tolerated treatment well   Patient Left in chair;with call bell/phone within reach   Nurse  Communication Mobility status        Time: 1856-3149 OT Time Calculation (min): 16 min  Charges: OT General Charges $OT Visit: 1 Visit OT Treatments $Self Care/Home Management : 8-22 mins  Helene Kelp OTR/L Acute Rehabilitation Services Office: [phone]    Wyn Forster 04/14/2021, 1:42 PM

## 2021-04-14 NOTE — Progress Notes (Signed)
PROGRESS NOTE    [name]  ZDG:[id number] DOB: June 15, 1946 DOA: 04/09/2021 PCP: Chesley Noon, MD   Brief Narrative:   [name] [name] is a 75 y.o. female with medical history significant of A. fib on Eliquis, type 2 diabetes, hypertension, hyperlipidemia, anxiety, anemia, obesity (BMI 37.76), GERD, sleep apnea presented to the ED with complaints of severe right hip pain for several days and inability to walk.  In the ED, vital signs stable. X-ray of hip showing moderate degenerative changes and no acute findings. Patient states she has had pain in her right hip for a long time but it has been worse for the past few days.  She had a fall back in January but denies any recent falls.  Due to her pain, it is difficult for her to ambulate at home.  She lives by herself and her primary care physician is currently working on finding her a bed at an assisted living facility.  States she is seen by an orthopedics and recently had a steroid shot but continues to to have pain in this hip.  She was admitted due to the fact that she has pain and she was living alone and was not safe to go home.  Evaluated by PT OT who recommended SNF.  TOC working on finding a place.  Assessment & Plan:   Principal Problem:   Hip pain Active Problems:   HLD (hyperlipidemia)   Hypokalemia   Atrial fibrillation (HCC)   Type 2 diabetes mellitus (HCC)   Right hip pain   Acute on chronic right hip pain: According to patient, her hip pain is several month old but that has gotten worse since about 1 week.  X-ray showing moderate degenerative changes and no acute findings. She had a fall back in January but denies any recent falls.  -PT/OT and social work consulted to help with placement as patient lives alone and is having difficulty ambulating.  Continue current pain management.  She is pain-free now.  Waiting for SNF placement.  Nausea/vomiting: She had vomiting yesterday after breakfast but then she did not  have any symptoms.  She tells me that she had vomiting this morning again this morning.  Passing flatus and having bowel movements.  Doubt SBO.  Continue symptomatic treatment.  Hypokalemia: Resolved.  Hypomagnesemia: Low again.  Will replace.  Permanent A. Fib: Rate controlled.  Continue amiodarone and Eliquis.  Type 2 diabetes A1c 5.4 on 03/12/2021 and 5.7 on 04/10/2021.  Continue SSI  Hyperlipidemia -Continue Lipitor  GERD -Continue PPI and H2 blocker  DVT prophylaxis:    Code Status: DNR  Family Communication: None at bedside.  Status is: Inpatient  Remains inpatient appropriate because:Inpatient level of care appropriate due to severity of illness   Dispo: The patient is from: Home              Anticipated d/c is to: SNF              Patient currently is medically stable to d/c.   Difficult to place patient No          Estimated body mass index is 37.76 kg/m as calculated from the following:   Height as of this encounter: 5' (1.524 m).   Weight as of this encounter: 87.7 kg.      Nutritional status:               Consultants:   None  Procedures:   None  Antimicrobials:  Anti-infectives (From  admission, onward)   None         Subjective: Seen and examined.  Complains of nausea and vomiting after breakfast again today.  No other complaint.  Passing flatus and having bowel movements.  Objective: Vitals:   04/13/21 1825 04/13/21 2241 04/14/21 0449 04/14/21 1202  BP: 108/69 106/72 121/66 130/73  Pulse: 76 67 61 68  Resp: 16 19 16 18   Temp: 97.6 F (36.4 C) 97.7 F (36.5 C) 97.6 F (36.4 C) 98.5 F (36.9 C)  TempSrc: Oral Oral Oral Oral  SpO2: 94% 94% 96% 98%  Weight:      Height:        Intake/Output Summary (Last 24 hours) at 04/14/2021 1312 Last data filed at 04/14/2021 3536 Gross per 24 hour  Intake 240 ml  Output 950 ml  Net -710 ml   Filed Weights   04/10/21 0052  Weight: 87.7 kg    Examination: General  exam: Appears calm and comfortable  Respiratory system: Clear to auscultation. Respiratory effort normal. Cardiovascular system: S1 & S2 heard, RRR. No JVD, murmurs, rubs, gallops or clicks. No pedal edema. Gastrointestinal system: Abdomen is nondistended, soft and nontender. No organomegaly or masses felt. Normal bowel sounds heard. Central nervous system: Alert and oriented. No focal neurological deficits. Extremities: Symmetric 5 x 5 power. Skin: No rashes, lesions or ulcers.  Psychiatry: Judgement and insight appear normal. Mood & affect appropriate.    Data Reviewed: I have personally reviewed following labs and imaging studies  CBC: Recent Labs  Lab 04/09/21 1725  WBC 3.9*  NEUTROABS 1.7  HGB 11.0*  HCT 33.8*  MCV 92.9  PLT 144   Basic Metabolic Panel: Recent Labs  Lab 04/09/21 1816 04/10/21 0544 04/10/21 0827 04/11/21 0459 04/14/21 0125  NA 136  --  137 137 138  K 2.9*  --  3.0* 4.0 3.7  CL 103  --  104 104 104  CO2 26  --  27 26 27   GLUCOSE 82  --  79 98 107*  BUN 11  --  9 13 15   CREATININE 0.72  --  0.73 0.78 0.93  CALCIUM 8.6*  --  8.6* 8.6* 8.6*  MG  --  1.6*  --  2.0 1.6*   GFR: Estimated Creatinine Clearance: 50.7 mL/min (by C-G formula based on SCr of 0.93 mg/dL). Liver Function Tests: No results for input(s): AST, ALT, ALKPHOS, BILITOT, PROT, ALBUMIN in the last 168 hours. No results for input(s): LIPASE, AMYLASE in the last 168 hours. No results for input(s): AMMONIA in the last 168 hours. Coagulation Profile: No results for input(s): INR, PROTIME in the last 168 hours. Cardiac Enzymes: No results for input(s): CKTOTAL, CKMB, CKMBINDEX, TROPONINI in the last 168 hours. BNP (last 3 results) No results for input(s): PROBNP in the last 8760 hours. HbA1C: No results for input(s): HGBA1C in the last 72 hours. CBG: Recent Labs  Lab 04/13/21 1105 04/13/21 1609 04/13/21 2110 04/14/21 0612 04/14/21 1107  GLUCAP 124* 120* 142* 107* 106*   Lipid  Profile: No results for input(s): CHOL, HDL, LDLCALC, TRIG, CHOLHDL, LDLDIRECT in the last 72 hours. Thyroid Function Tests: No results for input(s): TSH, T4TOTAL, FREET4, T3FREE, THYROIDAB in the last 72 hours. Anemia Panel: No results for input(s): VITAMINB12, FOLATE, FERRITIN, TIBC, IRON, RETICCTPCT in the last 72 hours. Sepsis Labs: No results for input(s): PROCALCITON, LATICACIDVEN in the last 168 hours.  Recent Results (from the past 240 hour(s))  SARS CORONAVIRUS 2 (TAT 6-24 HRS) Nasopharyngeal Nasopharyngeal  Swab     Status: None   Collection Time: 04/10/21 12:02 AM   Specimen: Nasopharyngeal Swab  Result Value Ref Range Status   SARS Coronavirus 2 NEGATIVE NEGATIVE Final    Comment: (NOTE) SARS-CoV-2 target nucleic acids are NOT DETECTED.  The SARS-CoV-2 RNA is generally detectable in upper and lower respiratory specimens during the acute phase of infection. Negative results do not preclude SARS-CoV-2 infection, do not rule out co-infections with other pathogens, and should not be used as the sole basis for treatment or other patient management decisions. Negative results must be combined with clinical observations, patient history, and epidemiological information. The expected result is Negative.  Fact Sheet for Patients: SugarRoll.be  Fact Sheet for Healthcare Providers: https://www.woods-mathews.com/  This test is not yet approved or cleared by the [place] FDA and  has been authorized for detection and/or diagnosis of SARS-CoV-2 by FDA under an Emergency Use Authorization (EUA). This EUA will remain  in effect (meaning this test can be used) for the duration of the COVID-19 declaration under Se ction 564(b)(1) of the Act, 21 U.S.C. section 360bbb-3(b)(1), unless the authorization is terminated or revoked sooner.  Performed at [facility], [place] [address], [place], [place] [address]       Radiology  Studies: No results found.  Scheduled Meds: . amiodarone  200 mg Oral BID  . apixaban  5 mg Oral BID  . atorvastatin  40 mg Oral Daily  . famotidine  40 mg Oral BID  . ferrous sulfate  325 mg Oral TID WC  . FLUoxetine  40 mg Oral Daily  . insulin aspart  0-5 Units Subcutaneous QHS  . insulin aspart  0-9 Units Subcutaneous TID WC  . lidocaine  1 patch Transdermal Q24H  . magnesium oxide  400 mg Oral Daily  . multivitamin with minerals  1 tablet Oral Daily  . pantoprazole  40 mg Oral Daily  . polyethylene glycol  17 g Oral Daily   Continuous Infusions:    LOS: 4 days   Time spent: 26 minutes   Darliss Cheney, MD [facility]  04/14/2021, 1:12 PM   How to contact the [facility] Attending or Consulting provider [place] or covering provider during after hours [place], for this patient?  1. Check the care team in [facility] and look for a) attending/consulting TRH provider listed and b) the [facility] team listed. Page or secure chat [phone]. 2. Log into [email or web address] and use [place]'s universal password to access. If you do not have the password, please contact the hospital operator. 3. Locate the [facility] provider you are looking for under [facility] and page to a number that you can be directly reached. 4. If you still have difficulty reaching the provider, please page the [facility] (Director on Call) for the Hospitalists listed on amion for assistance.

## 2021-04-14 NOTE — Progress Notes (Signed)
Physical Therapy Treatment Patient Details Name: [name] [name] MRN: [id number] DOB: [birth year] Today's Date: 04/14/2021    History of Present Illness 75 y.o. female presented to the ED 04/09/21 with complaints of severe right hip pain for several days and inability to walk.  X-ray of hip showing moderate degenerative changes and no acute findings Dx: acute on chronic hip pain.   PMH A. fib on Eliquis, type 2 diabetes, hypertension, hyperlipidemia, anxiety, anemia, obesity (BMI 37.76), GERD, sleep apnea    PT Comments    Pt admitted with above diagnosis. Pt was able to ambulate with RW with min guard- min assist progressing to hallway.  Right hip pain improving slowly. Needs continued therapy for balance and endurance as well as strength training.   Pt  will benefit from skilled PT to increase their independence and safety with mobility to allow discharge to the venue listed below.     Follow Up Recommendations  SNF     Equipment Recommendations  None recommended by PT (has necessary equipment)    Recommendations for Other Services OT consult     Precautions / Restrictions Precautions Precautions: Fall Precaution Comments: Had an episode of several falls last December Restrictions Weight Bearing Restrictions: No    Mobility  Bed Mobility Overal bed mobility: Needs Assistance Bed Mobility: Supine to Sit     Supine to sit: Min assist     General bed mobility comments: A little assist to come to EOB for trunk elevation as well as assist with right LE movement to EOB. Also assist to scoot to EOB.    Transfers Overall transfer level: Needs assistance Equipment used: Rolling walker (2 wheeled) Transfers: Sit to/from Stand Sit to Stand: Min guard         General transfer comment: min guard for safety, cuing for hand placement for power up able to self steady with RW  Ambulation/Gait Ambulation/Gait assistance: Min assist;Min guard Gait Distance (Feet): 70 Feet (70  feet, 30 feet) Assistive device: Rolling walker (2 wheeled) Gait Pattern/deviations: Step-through pattern;Decreased step length - right;Decreased step length - left;Decreased weight shift to right;Shuffle;Antalgic;Trunk flexed Gait velocity: slowed Gait velocity interpretation: <1.31 ft/sec, indicative of household ambulator General Gait Details: Pt ambulates with very slow gait, light min A for safety, x R knee buckle with fatigue, no LoB, pt walked to the hallway and stated she needed to urinate therefore pt walked back to the bathroom and used the bathroom.  Total assist to clean pt.  then pt walked some in room and back to chair and stated she was fatigued.   Stairs             Wheelchair Mobility    Modified Rankin (Stroke Patients Only)       Balance Overall balance assessment: Needs assistance Sitting-balance support: No upper extremity supported;Feet supported Sitting balance-Leahy Scale: Fair     Standing balance support: During functional activity;Bilateral upper extremity supported Standing balance-Leahy Scale: Poor Standing balance comment: relies on bil UE support for balance                            Cognition Arousal/Alertness: Awake/alert Behavior During Therapy: WFL for tasks assessed/performed Overall Cognitive Status: Within Functional Limits for tasks assessed                                 General Comments: Feel she is at  baseline for cognition      Exercises General Exercises - Lower Extremity Ankle Circles/Pumps: AROM;Both;10 reps;Supine Quad Sets: AROM;Both;10 reps;Supine Gluteal Sets: AROM;Both;10 reps;Supine Long Arc Quad: AROM;Both;10 reps;Seated Heel Slides: AAROM;Both;10 reps;Supine    General Comments General comments (skin integrity, edema, etc.): VSS on RA, sister in room      Pertinent Vitals/Pain Pain Assessment: Faces Faces Pain Scale: Hurts little more Pain Location: R hip Pain Descriptors /  Indicators: Discomfort;Grimacing Pain Intervention(s): Limited activity within patient's tolerance;Monitored during session;Repositioned    Home Living                      Prior Function            PT Goals (current goals can now be found in the care plan section) Acute Rehab PT Goals Patient Stated Goal: Get stronger - get into a ALF after rehab Progress towards PT goals: Progressing toward goals    Frequency    Min 2X/week      PT Plan Current plan remains appropriate    Co-evaluation              AM-PAC PT "6 Clicks" Mobility   Outcome Measure  Help needed turning from your back to your side while in a flat bed without using bedrails?: None Help needed moving from lying on your back to sitting on the side of a flat bed without using bedrails?: None Help needed moving to and from a bed to a chair (including a wheelchair)?: A Little Help needed standing up from a chair using your arms (e.g., wheelchair or bedside chair)?: A Little Help needed to walk in hospital room?: A Little Help needed climbing 3-5 steps with a railing? : Total 6 Click Score: 18    End of Session Equipment Utilized During Treatment: Gait belt Activity Tolerance: Patient limited by pain;Patient limited by fatigue Patient left: in chair;with call bell/phone within reach;with family/visitor present Nurse Communication: Mobility status PT Visit Diagnosis: Unsteadiness on feet (R26.81);Other abnormalities of gait and mobility (R26.89);History of falling (Z91.81);Muscle weakness (generalized) (M62.81);Difficulty in walking, not elsewhere classified (R26.2);Pain Pain - Right/Left: Right Pain - part of body: Hip;Knee     Time: 1031-1057 PT Time Calculation (min) (ACUTE ONLY): 26 min  Charges:  $Gait Training: 8-22 mins $Therapeutic Exercise: 8-22 mins                     Jowanda Heeg M,PT Acute Rehab Services [phone] (pager)   Alvira Philips 04/14/2021, 11:42 AM

## 2021-04-14 NOTE — TOC Progression Note (Signed)
Transition of Care [facility]) - Progression Note    Patient Details  Name: [name] [name] MRN: [id number] Date of Birth: 12/30/1945  Transition of Care [facility]) CM/SW [place], LCSW Phone Number: 04/14/2021, 10:51 AM  Clinical Narrative:    Per [facility], they can accept patient and will start insurance authorization with [employer].    Expected Discharge Plan: [place] Barriers to Discharge: Continued Medical Work up  Expected Discharge Plan and Services Expected Discharge Plan: [place] arrangements for the past 2 months: Mobile Home                                       Social Determinants of Health (SDOH) Interventions    Readmission Risk Interventions No flowsheet data found.

## 2021-04-15 DIAGNOSIS — M25559 Pain in unspecified hip: Secondary | ICD-10-CM | POA: Diagnosis not present

## 2021-04-15 LAB — GLUCOSE, CAPILLARY
Glucose-Capillary: 110 mg/dL — ABNORMAL HIGH (ref 70–99)
Glucose-Capillary: 99 mg/dL (ref 70–99)
Glucose-Capillary: 103 mg/dL — ABNORMAL HIGH (ref 70–99)
Glucose-Capillary: 146 mg/dL — ABNORMAL HIGH (ref 70–99)

## 2021-04-15 NOTE — TOC Progression Note (Signed)
Transition of Care [facility]) - Progression Note    Patient Details  Name: [name] [name] MRN: [id number] Date of Birth: [birth year]  Transition of Care [facility]) CM/SW [place], [place] Phone Number: 04/15/2021, 2:58 PM  Clinical Narrative:     CSW reached out to admissions coordinator at [place]. Insurance Josem Kaufmann is still pending. CSW will continue to check on auth.   Expected Discharge Plan: [place] Barriers to Discharge: Continued Medical Work up  Expected Discharge Plan and Services Expected Discharge Plan: [place] arrangements for the past 2 months: Mobile Home                                       Social Determinants of Health (SDOH) Interventions    Readmission Risk Interventions No flowsheet data found.  Emeterio Reeve, Latanya Presser, [place] Social Worker [phone]

## 2021-04-15 NOTE — Progress Notes (Signed)
PROGRESS NOTE    [name] [name]  WRU:[id number] DOB: [birth year] DOA: 04/09/2021 PCP: Chesley Noon, MD   Brief Narrative:   [name] [name] is a 75 y.o. female with medical history significant of A. fib on Eliquis, type 2 diabetes, hypertension, hyperlipidemia, anxiety, anemia, obesity (BMI 37.76), GERD, sleep apnea presented to the ED with complaints of severe right hip pain for several days and inability to walk.  In the ED, vital signs stable. X-ray of hip showing moderate degenerative changes and no acute findings. Patient states she has had pain in her right hip for a long time but it has been worse for the past few days.  Admitted as she was not safe to go home.  Evaluated by PT OT who recommended SNF.  TOC working on finding a place.  Assessment & Plan:   Principal Problem:   Hip pain Active Problems:   HLD (hyperlipidemia)   Hypokalemia   Atrial fibrillation (HCC)   Type 2 diabetes mellitus (HCC)   Right hip pain   Acute on chronic right hip pain: According to patient, her hip pain is several month old but that has gotten worse since about 1 week.  X-ray showing moderate degenerative changes and no acute findings. She had a fall back in January but denies any recent falls.  -PT/OT and social work consulted to help with placement as patient lives alone and is having difficulty ambulating.  Continue current pain management.   Waiting for SNF placement.  Nausea/vomiting:  -none further  Hypokalemia: repleted  Hypomagnesemia:repleted  Permanent A. Fib: Rate controlled.  Continue amiodarone and Eliquis.  Type 2 diabetes A1c 5.4 on 03/12/2021 and 5.7 on 04/10/2021.  Continue SSI  Hyperlipidemia -Continue Lipitor  GERD -Continue PPI and H2 blocker  Obesity Estimated body mass index is 37.76 kg/m as calculated from the following:   Height as of this encounter: 5' (1.524 m).   Weight as of this encounter: 87.7 kg.  DVT prophylaxis:    Code Status: DNR   Family Communication: None at bedside.  Status is: Inpatient  Remains inpatient appropriate because:Inpatient level of care appropriate due to severity of illness   Dispo: The patient is from: Home              Anticipated d/c is to: SNF              Patient currently is medically stable to d/c.- await bed placement   Difficult to place patient No    Subjective: No complaints except bed uncomfortable  Objective: Vitals:   04/14/21 1814 04/14/21 2310 04/15/21 0519 04/15/21 1210  BP: (!) 109/58 (!) 127/56 (!) 112/53 131/70  Pulse: 99 69 60 80  Resp: 18 18 16 18   Temp: 99.5 F (37.5 C) 97.8 F (36.6 C) 98.3 F (36.8 C) 98 F (36.7 C)  TempSrc: Oral Oral Oral Oral  SpO2: 99% 96% 95% 100%  Weight:      Height:        Intake/Output Summary (Last 24 hours) at 04/15/2021 1333 Last data filed at 04/15/2021 1258 Gross per 24 hour  Intake 480 ml  Output 1700 ml  Net -1220 ml   Filed Weights   04/10/21 0052  Weight: 87.7 kg    Examination:  General: Appearance:    Obese female in no acute distress     Lungs:     Clear to auscultation bilaterally, respirations unlabored  Heart:    Normal heart rate.    MS:  All extremities are intact.   Neurologic:   Awake, alert, pleasant and cooperative     Data Reviewed: I have personally reviewed following labs and imaging studies  CBC: Recent Labs  Lab 04/09/21 1725  WBC 3.9*  NEUTROABS 1.7  HGB 11.0*  HCT 33.8*  MCV 92.9  PLT 846   Basic Metabolic Panel: Recent Labs  Lab 04/09/21 1816 04/10/21 0544 04/10/21 0827 04/11/21 0459 04/14/21 0125  NA 136  --  137 137 138  K 2.9*  --  3.0* 4.0 3.7  CL 103  --  104 104 104  CO2 26  --  27 26 27   GLUCOSE 82  --  79 98 107*  BUN 11  --  9 13 15   CREATININE 0.72  --  0.73 0.78 0.93  CALCIUM 8.6*  --  8.6* 8.6* 8.6*  MG  --  1.6*  --  2.0 1.6*   GFR: Estimated Creatinine Clearance: 50.7 mL/min (by C-G formula based on SCr of 0.93 mg/dL). Liver Function Tests: No  results for input(s): AST, ALT, ALKPHOS, BILITOT, PROT, ALBUMIN in the last 168 hours. No results for input(s): LIPASE, AMYLASE in the last 168 hours. No results for input(s): AMMONIA in the last 168 hours. Coagulation Profile: No results for input(s): INR, PROTIME in the last 168 hours. Cardiac Enzymes: No results for input(s): CKTOTAL, CKMB, CKMBINDEX, TROPONINI in the last 168 hours. BNP (last 3 results) No results for input(s): PROBNP in the last 8760 hours. HbA1C: No results for input(s): HGBA1C in the last 72 hours. CBG: Recent Labs  Lab 04/14/21 1107 04/14/21 1629 04/14/21 2106 04/15/21 0602 04/15/21 1116  GLUCAP 106* 101* 104* 110* 99   Lipid Profile: No results for input(s): CHOL, HDL, LDLCALC, TRIG, CHOLHDL, LDLDIRECT in the last 72 hours. Thyroid Function Tests: No results for input(s): TSH, T4TOTAL, FREET4, T3FREE, THYROIDAB in the last 72 hours. Anemia Panel: No results for input(s): VITAMINB12, FOLATE, FERRITIN, TIBC, IRON, RETICCTPCT in the last 72 hours. Sepsis Labs: No results for input(s): PROCALCITON, LATICACIDVEN in the last 168 hours.  Recent Results (from the past 240 hour(s))  SARS CORONAVIRUS 2 (TAT 6-24 HRS) Nasopharyngeal Nasopharyngeal Swab     Status: None   Collection Time: 04/10/21 12:02 AM   Specimen: Nasopharyngeal Swab  Result Value Ref Range Status   SARS Coronavirus 2 NEGATIVE NEGATIVE Final    Comment: (NOTE) SARS-CoV-2 target nucleic acids are NOT DETECTED.  The SARS-CoV-2 RNA is generally detectable in upper and lower respiratory specimens during the acute phase of infection. Negative results do not preclude SARS-CoV-2 infection, do not rule out co-infections with other pathogens, and should not be used as the sole basis for treatment or other patient management decisions. Negative results must be combined with clinical observations, patient history, and epidemiological information. The expected result is Negative.  Fact Sheet for  Patients: SugarRoll.be  Fact Sheet for Healthcare Providers: https://www.woods-mathews.com/  This test is not yet approved or cleared by the [place] FDA and  has been authorized for detection and/or diagnosis of SARS-CoV-2 by FDA under an Emergency Use Authorization (EUA). This EUA will remain  in effect (meaning this test can be used) for the duration of the COVID-19 declaration under Se ction 564(b)(1) of the Act, 21 U.S.C. section 360bbb-3(b)(1), unless the authorization is terminated or revoked sooner.  Performed at [facility], [place] [address], [place], [place] [address]       Radiology Studies: No results found.  Scheduled Meds: .  amiodarone  200 mg Oral BID  . apixaban  5 mg Oral BID  . atorvastatin  40 mg Oral Daily  . famotidine  40 mg Oral BID  . ferrous sulfate  325 mg Oral TID WC  . FLUoxetine  40 mg Oral Daily  . insulin aspart  0-5 Units Subcutaneous QHS  . insulin aspart  0-9 Units Subcutaneous TID WC  . lidocaine  1 patch Transdermal Q24H  . magnesium oxide  400 mg Oral Daily  . multivitamin with minerals  1 tablet Oral Daily  . pantoprazole  40 mg Oral Daily  . polyethylene glycol  17 g Oral Daily   Continuous Infusions:    LOS: 5 days   Time spent: 25 minutes   Geradine Girt, DO [facility]  04/15/2021, 1:33 PM   How to contact the [facility] Attending or Consulting provider [place] or covering provider during after hours [place], for this patient?  1. Check the care team in [facility] and look for a) attending/consulting [facility] provider listed and b) the [facility] team listed. Page or secure chat [phone]. 2. Log into [email or web address] and use 's universal password to access. If you do not have the password, please contact the hospital operator. 3. Locate the [facility] provider you are looking for under [facility] and page to a number that you can be directly reached. 4. If you still have difficulty  reaching the provider, please page the [facility] (Director on Call) for the Hospitalists listed on amion for assistance.

## 2021-04-16 DIAGNOSIS — M25559 Pain in unspecified hip: Secondary | ICD-10-CM | POA: Diagnosis not present

## 2021-04-16 LAB — GLUCOSE, CAPILLARY
Glucose-Capillary: 116 mg/dL — ABNORMAL HIGH (ref 70–99)
Glucose-Capillary: 108 mg/dL — ABNORMAL HIGH (ref 70–99)
Glucose-Capillary: 128 mg/dL — ABNORMAL HIGH (ref 70–99)
Glucose-Capillary: 155 mg/dL — ABNORMAL HIGH (ref 70–99)

## 2021-04-16 MED ORDER — ALPRAZOLAM 0.25 MG PO TABS: 0.2500 mg | ORAL_TABLET | Freq: Two times a day (BID) | ORAL | 0 refills | Status: DC | PRN

## 2021-04-16 MED ORDER — HYDROCODONE-ACETAMINOPHEN 5-325 MG PO TABS: 1.0000 | ORAL_TABLET | Freq: Four times a day (QID) | ORAL | 0 refills | Status: DC | PRN

## 2021-04-16 MED ORDER — LIDOCAINE 5 % EX PTCH: 1.0000 | MEDICATED_PATCH | CUTANEOUS | 0 refills | Status: AC

## 2021-04-16 NOTE — Discharge Summary (Addendum)
Physician Discharge Summary  TENIOLA TSENG HCW:[id number] DOB: [birth year] DOA: 04/09/2021  PCP: Chesley Noon, MD  Admit date: 04/09/2021 Discharge date: 04/17/2021  Admitted From: home Discharge disposition: SNF  Recommendations for Outpatient Follow-Up:   Ortho outpatient Incentive spirometry  Discharge Diagnosis:   Principal Problem:   Hip pain Active Problems:   HLD (hyperlipidemia)   Hypokalemia   Atrial fibrillation (HCC)   Type 2 diabetes mellitus ([place])   Right hip pain    Discharge Condition: Improved.  Diet recommendation: Low sodium, heart healthy.  Carbohydrate-modified.  Wound care: None.  Code status: dnr   History of Present Illness:   [name] [name] is a 75 y.o. female with medical history significant of A. fib on Eliquis, type 2 diabetes, hypertension, hyperlipidemia, anxiety, anemia, obesity (BMI 37.76), GERD, sleep apnea presented to the ED with complaints of severe right hip pain for several days and inability to walk.  In the ED, vital signs stable.  She was given Tylenol, fentanyl, and Dilaudid for pain.  Lidocaine patch applied.  She was given IV potassium 10 mEq x 1.  Labs showing WBC 3.9, hemoglobin 11.0 (stable), platelet count 202K.  Sodium 136, potassium 2.9, chloride 103, bicarb 26, BUN 11, creatinine 0.7, glucose 82.  BNP 129.  UA without signs of infection.  Screening COVID test pending.  X-ray of hip showing moderate degenerative changes and no acute findings.  Patient states she has had pain in her right hip for a long time but it has been worse for the past few days.  She had a fall back in January but denies any recent falls.  Due to her pain, it is difficult for her to ambulate at home.  She lives by herself and her primary care physician is currently working on finding her a bed at an assisted living facility.  States she is seen by an orthopedics and recently had a steroid shot but continues to to have pain in this  hip.   Hospital Course by Problem:   Acute on chronic right hip pain: According to patient, her hip pain is several month old but that has gotten worse since about 1 week.  X-ray showing moderate degenerative changes and no acute findings. She had a fall back in January but denies any recent falls.  -PT/OT and social work consulted to help with placement as patient lives alone and is having difficulty ambulating.  Continue current pain management.   Waiting for SNF placement.  Nausea/vomiting:  -none further  Hypokalemia: repleted  Hypomagnesemia:repleted  Permanent A. Fib: Rate controlled.  Continue amiodarone and Eliquis.  Type 2 diabetes A1c 5.4 on 03/12/2021 and 5.7 on 04/10/2021.   Diet controlled  Hyperlipidemia -Continue Lipitor  GERD -Continue PPI and H2 blocker  Obesity Estimated body mass index is 37.76 kg/m as calculated from the following:   Height as of this encounter: 5' (1.524 m).   Weight as of this encounter: 87.7 kg.    Medical Consultants:      Discharge Exam:   Vitals:   04/15/21 2319 04/16/21 0418  BP: (!) 113/51 99/75  Pulse: 68 69  Resp: 19 16  Temp: 98.2 F (36.8 C) 97.6 F (36.4 C)  SpO2: 94% 99%   Vitals:   04/15/21 1210 04/15/21 1648 04/15/21 2319 04/16/21 0418  BP: 131/70 (!) 98/57 (!) 113/51 99/75  Pulse: 80 60 68 69  Resp: 18 18 19 16   Temp: 98 F (36.7 C) 98.6 F (  37 C) 98.2 F (36.8 C) 97.6 F (36.4 C)  TempSrc: Oral Oral Axillary Oral  SpO2: 100% 95% 94% 99%  Weight:      Height:        General exam: Appears calm and comfortable.   The results of significant diagnostics from this hospitalization (including imaging, microbiology, ancillary and laboratory) are listed below for reference.     Procedures and Diagnostic Studies:   DG Hip Unilat W or Wo Pelvis 2-3 Views Right  Result Date: 04/09/2021 CLINICAL DATA:  Fall EXAM: DG HIP (WITH OR WITHOUT PELVIS) 2-3V RIGHT COMPARISON:  02/19/2021 FINDINGS: SI  joints are non widened. Pubic symphysis and rami appear intact. Left hip replacement with normal alignment and intact hardware. No fracture or malalignment of the right hip. Moderate degenerative changes. IMPRESSION: 1. Status post left hip replacement. 2. No acute osseous abnormality. Electronically Signed   By: Donavan Foil M.D.   On: 04/09/2021 23:33     Labs:   Basic Metabolic Panel: Recent Labs  Lab 04/09/21 1816 04/10/21 0544 04/10/21 0827 04/11/21 0459 04/14/21 0125  NA 136  --  137 137 138  K 2.9*  --  3.0* 4.0 3.7  CL 103  --  104 104 104  CO2 26  --  27 26 27   GLUCOSE 82  --  79 98 107*  BUN 11  --  9 13 15   CREATININE 0.72  --  0.73 0.78 0.93  CALCIUM 8.6*  --  8.6* 8.6* 8.6*  MG  --  1.6*  --  2.0 1.6*   GFR Estimated Creatinine Clearance: 50.7 mL/min (by C-G formula based on SCr of 0.93 mg/dL). Liver Function Tests: No results for input(s): AST, ALT, ALKPHOS, BILITOT, PROT, ALBUMIN in the last 168 hours. No results for input(s): LIPASE, AMYLASE in the last 168 hours. No results for input(s): AMMONIA in the last 168 hours. Coagulation profile No results for input(s): INR, PROTIME in the last 168 hours.  CBC: Recent Labs  Lab 04/09/21 1725  WBC 3.9*  NEUTROABS 1.7  HGB 11.0*  HCT 33.8*  MCV 92.9  PLT 202   Cardiac Enzymes: No results for input(s): CKTOTAL, CKMB, CKMBINDEX, TROPONINI in the last 168 hours. BNP: Invalid input(s): POCBNP CBG: Recent Labs  Lab 04/15/21 0602 04/15/21 1116 04/15/21 1632 04/15/21 2110 04/16/21 0611  GLUCAP 110* 99 103* 146* 116*   D-Dimer No results for input(s): DDIMER in the last 72 hours. Hgb A1c No results for input(s): HGBA1C in the last 72 hours. Lipid Profile No results for input(s): CHOL, HDL, LDLCALC, TRIG, CHOLHDL, LDLDIRECT in the last 72 hours. Thyroid function studies No results for input(s): TSH, T4TOTAL, T3FREE, THYROIDAB in the last 72 hours.  Invalid input(s): FREET3 Anemia work up No results  for input(s): VITAMINB12, FOLATE, FERRITIN, TIBC, IRON, RETICCTPCT in the last 72 hours. Microbiology Recent Results (from the past 240 hour(s))  SARS CORONAVIRUS 2 (TAT 6-24 HRS) Nasopharyngeal Nasopharyngeal Swab     Status: None   Collection Time: 04/10/21 12:02 AM   Specimen: Nasopharyngeal Swab  Result Value Ref Range Status   SARS Coronavirus 2 NEGATIVE NEGATIVE Final    Comment: (NOTE) SARS-CoV-2 target nucleic acids are NOT DETECTED.  The SARS-CoV-2 RNA is generally detectable in upper and lower respiratory specimens during the acute phase of infection. Negative results do not preclude SARS-CoV-2 infection, do not rule out co-infections with other pathogens, and should not be used as the sole basis for treatment or other patient management  decisions. Negative results must be combined with clinical observations, patient history, and epidemiological information. The expected result is Negative.  Fact Sheet for Patients: SugarRoll.be  Fact Sheet for Healthcare Providers: https://www.woods-mathews.com/  This test is not yet approved or cleared by the [place] FDA and  has been authorized for detection and/or diagnosis of SARS-CoV-2 by FDA under an Emergency Use Authorization (EUA). This EUA will remain  in effect (meaning this test can be used) for the duration of the COVID-19 declaration under Se ction 564(b)(1) of the Act, 21 U.S.C. section 360bbb-3(b)(1), unless the authorization is terminated or revoked sooner.  Performed at [facility], [place] [address] [place], [place] [address]      Discharge Instructions:   Discharge Instructions    Diet - low sodium heart healthy   Complete by: As directed    Diet Carb Modified   Complete by: As directed      Allergies as of 04/16/2021      Reactions   Ace Inhibitors    Morphine And Related    hallucinations   Codeine Other (See Comments)   headache    Oxycodone-acetaminophen Nausea And Vomiting      Medication List    STOP taking these medications   methocarbamol 500 MG tablet Commonly known as: ROBAXIN     TAKE these medications   acetaminophen 650 MG CR tablet Commonly known as: TYLENOL Take 1,300 mg by mouth every 8 (eight) hours as needed for pain.   ALPRAZolam 0.25 MG tablet Commonly known as: XANAX Take 1 tablet (0.25 mg total) by mouth 2 (two) times daily as needed for anxiety.   amiodarone 200 MG tablet Commonly known as: PACERONE Take 200 mg by mouth 2 (two) times daily.   apixaban 5 MG Tabs tablet Commonly known as: ELIQUIS Take 1 tablet (5 mg total) by mouth 2 (two) times daily.   famotidine 40 MG tablet Commonly known as: PEPCID Take 40 mg by mouth 2 (two) times daily.   ferrous sulfate 325 (65 FE) MG tablet Take 325 mg by mouth 3 (three) times daily with meals.   FLUoxetine 40 MG capsule Commonly known as: PROZAC Take 40 mg by mouth daily.   HYDROcodone-acetaminophen 5-325 MG tablet Commonly known as: NORCO/VICODIN Take 1 tablet by mouth every 6 (six) hours as needed for moderate pain.   lidocaine 5 % Commonly known as: LIDODERM Place 1 patch onto the skin daily. Remove & Discard patch within 12 hours or as directed by MD   Magnesium Oxide 400 MG Caps Take 1 capsule (400 mg total) by mouth daily.   omeprazole 40 MG capsule Commonly known as: PRILOSEC Take 40 mg by mouth daily.   One-A-Day Womens 50+ Tabs Take 1 tablet by mouth daily.   polyethylene glycol powder 17 GM/SCOOP powder Commonly known as: GLYCOLAX/MIRALAX Take 17 g by mouth daily.   simvastatin 80 MG tablet Commonly known as: ZOCOR Take 80 mg by mouth at bedtime.       Contact information for after-discharge care    Destination    [facility] .   Service: Skilled Nursing Contact information: [place] [phone]                   Time  coordinating discharge: 35 min  Signed:  Geradine Girt DO  [facility] 04/16/2021, 9:56 AM

## 2021-04-16 NOTE — TOC Progression Note (Signed)
Transition of Care [facility]) - Progression Note    Patient Details  Name: [name] [name] MRN: [id number] Date of Birth: 03/09/1946  Transition of Care [facility]) CM/SW [place], [place] Phone Number: 04/16/2021, 12:34 PM  Clinical Narrative:     Pts insurance Josem Kaufmann is still pending.   Expected Discharge Plan: [place] Barriers to Discharge: Continued Medical Work up  Expected Discharge Plan and Services Expected Discharge Plan: [place] arrangements for the past 2 months: Mobile Home Expected Discharge Date: 04/16/21                                     Social Determinants of Health (SDOH) Interventions    Readmission Risk Interventions No flowsheet data found.  Emeterio Reeve, Latanya Presser, [place] Social Worker [phone]

## 2021-04-16 NOTE — Progress Notes (Signed)
Occupational Therapy Treatment Patient Details Name: [name] [name] MRN: [id number] DOB: 1946/02/28 Today's Date: 04/16/2021    History of present illness 75 y.o. female presented to the ED 04/09/21 with complaints of severe right hip pain for several days and inability to walk.  X-ray of hip showing moderate degenerative changes and no acute findings Dx: acute on chronic hip pain.   PMH A. fib on Eliquis, type 2 diabetes, hypertension, hyperlipidemia, anxiety, anemia, obesity (BMI 37.76), GERD, sleep apnea   OT comments  Patient with continued progression toward patient focused OT goals.  OT demonstrated, and had patient use the hip kit this date for placing socks, pulling items over her feet and practicing bringing items over her hips for balance and confidence in stand, needing Min Guard.  Patient needing up to Mod A for completion of lower body ADL with the hip kit given its newness.  Patient able to complete toileting task with supervision at [facility] level.  Assist for peri care.  Patient continues to need increasing assist, and does not have the appropriate assist at home to ensure a safe transition.  SNF continues to be recommended for post acute rehab to maximize her functional status for an eventual return home.  Patient states her sister helps her, but now must care for her ailing husband.    Follow Up Recommendations  SNF    Equipment Recommendations  Other (comment)    Recommendations for Other Services      Precautions / Restrictions Precautions Precautions: Fall Precaution Comments: mult falls, anxious Restrictions Weight Bearing Restrictions: No       Mobility Bed Mobility               General bed mobility comments: in recliner when PT arrived    Transfers Overall transfer level: Needs assistance Equipment used: Rolling walker (2 wheeled) Transfers: Sit to/from Stand Sit to Stand: Min assist         General transfer comment: pt is asking for standing  support, but is able to stand up with min guard    Balance Overall balance assessment: Needs assistance Sitting-balance support: Feet supported Sitting balance-Leahy Scale: Fair     Standing balance support: Bilateral upper extremity supported;During functional activity Standing balance-Leahy Scale: Poor Standing balance comment: pt is less balanced today, likely due to pain                           ADL either performed or assessed with clinical judgement   ADL       Grooming: Supervision/safety;Standing;Wash/dry hands                   Toilet Transfer: Min guard;RW;Ambulation   Toileting- Clothing Manipulation and Hygiene: Moderate assistance;Sit to/from stand       Functional mobility during ADLs: Passenger transport manager     Praxis      Cognition Arousal/Alertness: Awake/alert Behavior During Therapy: Anxious Overall Cognitive Status: Within Functional Limits for tasks assessed                                 General Comments: pt is definite fall risk        Exercises Exercises: General Lower Extremity General Exercises - Lower Extremity Long Arc Quad: AROM;Strengthening;10 reps Heel Slides: AROM;Strengthening;10 reps Hip ABduction/ADduction: AROM;Strengthening;10 reps  Shoulder Instructions       General Comments      Pertinent Vitals/ Pain       Pain Assessment: Faces Faces Pain Scale: Hurts a little bit Pain Location: R hip Pain Descriptors / Indicators: Aching Pain Intervention(s): Monitored during session                                                          Frequency  Min 2X/week        Progress Toward Goals  OT Goals(current goals can now be found in the care plan section)  Progress towards OT goals: Progressing toward goals  Acute Rehab OT Goals Patient Stated Goal: I need to get stronger, I'm just so weak OT Goal Formulation: With  patient Time For Goal Achievement: 04/24/21 Potential to Achieve Goals: Good  Plan Discharge plan remains appropriate    Co-evaluation                 AM-PAC OT "6 Clicks" Daily Activity     Outcome Measure   Help from another person eating meals?: A Little Help from another person taking care of personal grooming?: A Little Help from another person toileting, which includes using toliet, bedpan, or urinal?: A Little Help from another person bathing (including washing, rinsing, drying)?: A Lot Help from another person to put on and taking off regular upper body clothing?: A Little Help from another person to put on and taking off regular lower body clothing?: A Lot 6 Click Score: 16    End of Session Equipment Utilized During Treatment: Rolling walker  OT Visit Diagnosis: Unsteadiness on feet (R26.81);Other abnormalities of gait and mobility (R26.89);Muscle weakness (generalized) (M62.81);Pain Pain - Right/Left: Right Pain - part of body: Hip   Activity Tolerance Patient tolerated treatment well   Patient Left in chair;with call bell/phone within reach   Nurse Communication          Time: 4098-1191 OT Time Calculation (min): 25 min  Charges: OT General Charges $OT Visit: 1 Visit OT Treatments $Self Care/Home Management : 23-37 mins  04/16/2021  Rich, OTR/L  Acute Rehabilitation Services  Office:  [phone]    [name] [name] 04/16/2021, 2:29 PM

## 2021-04-16 NOTE — Care Management Important Message (Signed)
Important Message  Patient Details  Name: [name] [name] MRN: [id number] Date of Birth: 1946-01-11   Medicare Important Message Given:  Yes     [place] 04/16/2021, 2:16 PM

## 2021-04-16 NOTE — Progress Notes (Signed)
Physical Therapy Treatment Patient Details Name: [name] [name] MRN: [id number] DOB: 1946-08-27 Today's Date: 04/16/2021    History of Present Illness 75 y.o. female presented to the ED 04/09/21 with complaints of severe right hip pain for several days and inability to walk.  X-ray of hip showing moderate degenerative changes and no acute findings Dx: acute on chronic hip pain.   PMH A. fib on Eliquis, type 2 diabetes, hypertension, hyperlipidemia, anxiety, anemia, obesity (BMI 37.76), GERD, sleep apnea    PT Comments    Pt was seen for mobility on RW with help to steady herself, noting her struggle with confidence and fear that any unsteadiness is going to lead to a fall.  Pt is reluctant to use LE's to do strengthening, esp related to concern about greater pain on R leg today. Pt has been medicated but was not complete success in getting her pain managed.  Repositioned on chair for better posture of leg, and will continue to recommend SNF for strength and balance changes from her OA.  Follow acutely for same goals as ordered.   Follow Up Recommendations  SNF     Equipment Recommendations  None recommended by PT    Recommendations for Other Services OT consult     Precautions / Restrictions Precautions Precautions: Fall Precaution Comments: mult falls, anxious Restrictions Weight Bearing Restrictions: No    Mobility  Bed Mobility               General bed mobility comments: in recliner when PT arrived    Transfers Overall transfer level: Needs assistance Equipment used: Rolling walker (2 wheeled) Transfers: Sit to/from Stand Sit to Stand: Min guard         General transfer comment: pt is asking for standing support, but is able to stand up with min guard  Ambulation/Gait Ambulation/Gait assistance: Min guard;Min assist Gait Distance (Feet): 40 Feet Assistive device: Rolling walker (2 wheeled) Gait Pattern/deviations: Step-to pattern;Step-through pattern;Wide  base of support;Drifts right/left;Decreased stride length Gait velocity: reduced Gait velocity interpretation: <1.8 ft/sec, indicate of risk for recurrent falls General Gait Details: mild R knee buckle near end of gait and pt is panicked, but did not seem likely to fall.  Definitely needs a steadying person to walk   Stairs             Wheelchair Mobility    Modified Rankin (Stroke Patients Only)       Balance Overall balance assessment: Needs assistance Sitting-balance support: Feet supported Sitting balance-Leahy Scale: Fair     Standing balance support: Bilateral upper extremity supported;During functional activity Standing balance-Leahy Scale: Poor Standing balance comment: pt is less balanced today, likely due to pain                            Cognition Arousal/Alertness: Awake/alert Behavior During Therapy: WFL for tasks assessed/performed Overall Cognitive Status: Within Functional Limits for tasks assessed                                 General Comments: pt is definite fall risk      Exercises General Exercises - Lower Extremity Long Arc Quad: AROM;Strengthening;10 reps Heel Slides: AROM;Strengthening;10 reps Hip ABduction/ADduction: AROM;Strengthening;10 reps    General Comments        Pertinent Vitals/Pain Pain Assessment: Faces Faces Pain Scale: Hurts even more Pain Location: R hip Pain Descriptors /  Indicators: Grimacing;Guarding Pain Intervention(s): Limited activity within patient's tolerance;Monitored during session;Premedicated before session;Repositioned    Home Living                      Prior Function            PT Goals (current goals can now be found in the care plan section) Acute Rehab PT Goals Patient Stated Goal: go to ALF Progress towards PT goals: Not progressing toward goals - comment (static progress but pain is hindering)    Frequency    Min 2X/week      PT Plan Current  plan remains appropriate    Co-evaluation              AM-PAC PT "6 Clicks" Mobility   Outcome Measure  Help needed turning from your back to your side while in a flat bed without using bedrails?: None Help needed moving from lying on your back to sitting on the side of a flat bed without using bedrails?: A Little Help needed moving to and from a bed to a chair (including a wheelchair)?: A Little Help needed standing up from a chair using your arms (e.g., wheelchair or bedside chair)?: A Little Help needed to walk in hospital room?: A Little Help needed climbing 3-5 steps with a railing? : Total 6 Click Score: 17    End of Session Equipment Utilized During Treatment: Gait belt Activity Tolerance: Patient limited by pain;Patient limited by fatigue Patient left: in chair;with call bell/phone within reach (up with nursing without chair alarm) Nurse Communication: Mobility status PT Visit Diagnosis: Unsteadiness on feet (R26.81);Other abnormalities of gait and mobility (R26.89);History of falling (Z91.81);Muscle weakness (generalized) (M62.81);Difficulty in walking, not elsewhere classified (R26.2);Pain Pain - Right/Left: Right Pain - part of body: Hip     Time: 1140-1210 PT Time Calculation (min) (ACUTE ONLY): 30 min  Charges:  $Gait Training: 8-22 mins $Therapeutic Exercise: 8-22 mins                  [name] [name] 04/16/2021, 12:56 PM [name] [name], PT MS Acute Rehab Dept. Number: [place]

## 2021-04-17 LAB — GLUCOSE, CAPILLARY
Glucose-Capillary: 97 mg/dL (ref 70–99)
Glucose-Capillary: 104 mg/dL — ABNORMAL HIGH (ref 70–99)
Glucose-Capillary: 157 mg/dL — ABNORMAL HIGH (ref 70–99)
Glucose-Capillary: 120 mg/dL — ABNORMAL HIGH (ref 70–99)

## 2021-04-17 NOTE — Progress Notes (Signed)
Physical Therapy Treatment Patient Details Name: [name] [name] MRN: [id number] DOB: 30-Sep-1946 Today's Date: 04/17/2021    History of Present Illness 75 y.o. female presented to the ED 04/09/21 with complaints of severe right hip pain for several days and inability to walk.  X-ray of hip showing moderate degenerative changes and no acute findings Dx: acute on chronic hip pain.   PMH A. fib on Eliquis, type 2 diabetes, hypertension, hyperlipidemia, anxiety, anemia, obesity (BMI 37.76), GERD, sleep apnea    PT Comments    The pt was tearful and anxious upon arrival of PT today, calling out for help. The pt was eager to move from the bed, stating she was having increased pain and stiffness from not mobilizing all day. The pt was able to demo good sit-stand transfers, progressing from minA to minG through session with use of RW and VC for hand placement and safety. The pt was able to complete ambulation in her room and hallway with minG initially, but progressed to needing minA to steady due to increased anxiety as she fatigued related to fear of falling. The pt continues to present with significant R hip pain that limited progression of mobility at this time, eager to continue mobilizing with therapy as well as with RN staff to complete ADLs in the room.     Follow Up Recommendations  SNF     Equipment Recommendations  None recommended by PT    Recommendations for Other Services       Precautions / Restrictions Precautions Precautions: Fall Precaution Comments: mult falls, anxious Restrictions Weight Bearing Restrictions: No    Mobility  Bed Mobility Overal bed mobility: Needs Assistance Bed Mobility: Supine to Sit     Supine to sit: Min assist     General bed mobility comments: minA to elevate trunk from [facility]    Transfers Overall transfer level: Needs assistance Equipment used: Rolling walker (2 wheeled) Transfers: Sit to/from Stand Sit to Stand: Min assist;Min guard          General transfer comment: pt required minA initially to power up from EOB. progressed to minG with increased time and effort to rise. VC for hand placement  Ambulation/Gait Ambulation/Gait assistance: Min guard;Min assist Gait Distance (Feet): 20 Feet Assistive device: Rolling walker (2 wheeled) Gait Pattern/deviations: Step-through pattern;Decreased step length - left;Decreased stance time - right;Decreased weight shift to right;Antalgic;Trunk flexed Gait velocity: reduced Gait velocity interpretation: <1.31 ft/sec, indicative of household ambulator General Gait Details: small steps with no instances of knee buclking, but increased pt anxiety with continued movement. requesting seated rest after 20 ft         Balance Overall balance assessment: Needs assistance Sitting-balance support: Feet supported Sitting balance-Leahy Scale: Fair     Standing balance support: Bilateral upper extremity supported;During functional activity Standing balance-Leahy Scale: Poor Standing balance comment: reliant on BUE support                            Cognition Arousal/Alertness: Awake/alert Behavior During Therapy: Anxious (tearful, upset) Overall Cognitive Status: Within Functional Limits for tasks assessed                                 General Comments: pt tearful about lack of mobility upon arrival of PT, but remains anxious with hallway ambulation due to fear of falling. max encouragement, support, therapeutic listening, and change of scenery  used dueing session to boost spirits      Exercises General Exercises - Lower Extremity Long Arc Quad: AROM;Both;5 reps;Seated Hip ABduction/ADduction: AROM;Both;5 reps;Seated Hip Flexion/Marching: AROM;Both;5 reps;Seated Toe Raises: AROM;Both;5 reps;Seated Heel Raises: AROM;Both;5 reps;Seated    General Comments        Pertinent Vitals/Pain Pain Assessment: Faces Faces Pain Scale: Hurts a little bit Pain  Location: R hip Pain Descriptors / Indicators: Aching Pain Intervention(s): Limited activity within patient's tolerance;Monitored during session;Repositioned;Relaxation;Utilized relaxation techniques           PT Goals (current goals can now be found in the care plan section) Acute Rehab PT Goals Patient Stated Goal: I need to get stronger, I'm just so weak PT Goal Formulation: With patient/family Time For Goal Achievement: 04/24/21 Potential to Achieve Goals: Good Progress towards PT goals: Progressing toward goals    Frequency    Min 2X/week      PT Plan Current plan remains appropriate       AM-PAC PT "6 Clicks" Mobility   Outcome Measure  Help needed turning from your back to your side while in a flat bed without using bedrails?: None Help needed moving from lying on your back to sitting on the side of a flat bed without using bedrails?: A Little Help needed moving to and from a bed to a chair (including a wheelchair)?: A Little Help needed standing up from a chair using your arms (e.g., wheelchair or bedside chair)?: A Little Help needed to walk in hospital room?: A Little Help needed climbing 3-5 steps with a railing? : Total 6 Click Score: 17    End of Session Equipment Utilized During Treatment: Gait belt Activity Tolerance: Patient limited by pain;Patient limited by fatigue Patient left: in chair;with call bell/phone within reach Nurse Communication: Mobility status PT Visit Diagnosis: Unsteadiness on feet (R26.81);Other abnormalities of gait and mobility (R26.89);History of falling (Z91.81);Muscle weakness (generalized) (M62.81);Difficulty in walking, not elsewhere classified (R26.2);Pain Pain - Right/Left: Right Pain - part of body: Hip     Time: 7322-0254 PT Time Calculation (min) (ACUTE ONLY): 27 min  Charges:  $Gait Training: 8-22 mins $Therapeutic Activity: 8-22 mins                     Karma Ganja, PT, DPT   Acute Rehabilitation Department Pager  #: [phone]   Otho Bellows 04/17/2021, 5:56 PM

## 2021-04-17 NOTE — Progress Notes (Signed)
Continue to await placement in SNF for short term rehab prior to patient being able to return home.  Making progress with PT/OT. Eulogio Bear DO

## 2021-04-18 LAB — GLUCOSE, CAPILLARY
Glucose-Capillary: 107 mg/dL — ABNORMAL HIGH (ref 70–99)
Glucose-Capillary: 112 mg/dL — ABNORMAL HIGH (ref 70–99)
Glucose-Capillary: 114 mg/dL — ABNORMAL HIGH (ref 70–99)
Glucose-Capillary: 121 mg/dL — ABNORMAL HIGH (ref 70–99)

## 2021-04-18 NOTE — Progress Notes (Signed)
Patient in chair.  Continue to await for insurance authorization. Eulogio Bear DO

## 2021-04-19 LAB — GLUCOSE, CAPILLARY
Glucose-Capillary: 105 mg/dL — ABNORMAL HIGH (ref 70–99)
Glucose-Capillary: 100 mg/dL — ABNORMAL HIGH (ref 70–99)
Glucose-Capillary: 149 mg/dL — ABNORMAL HIGH (ref 70–99)
Glucose-Capillary: 90 mg/dL (ref 70–99)

## 2021-04-19 MED ORDER — DICLOFENAC SODIUM 1 % EX GEL
4.0000 g | Freq: Four times a day (QID) | CUTANEOUS | Status: DC
  Administered 2021-04-19 – 2021-04-20 (×6): 4 g via TOPICAL
  Filled 2021-04-19: qty 100

## 2021-04-19 NOTE — Progress Notes (Signed)
No overnight events, patient in chair.  Continue to await authorization. Eulogio Bear DO

## 2021-04-20 DIAGNOSIS — M25559 Pain in unspecified hip: Secondary | ICD-10-CM | POA: Diagnosis not present

## 2021-04-20 LAB — GLUCOSE, CAPILLARY
Glucose-Capillary: 88 mg/dL (ref 70–99)
Glucose-Capillary: 108 mg/dL — ABNORMAL HIGH (ref 70–99)
Glucose-Capillary: 108 mg/dL — ABNORMAL HIGH (ref 70–99)

## 2021-04-20 MED ORDER — DICLOFENAC SODIUM 1 % EX GEL: 4.0000 g | Freq: Four times a day (QID) | CUTANEOUS | Status: DC

## 2021-04-20 NOTE — Discharge Summary (Signed)
Physician Discharge Summary  [name] [name] FBP:[id number] DOB: 07-11-1946 DOA: 04/09/2021  PCP: Chesley Noon, MD  Admit date: 04/09/2021 Discharge date: 04/20/2021  Admitted From: home Discharge disposition: SNF  Recommendations for Outpatient Follow-Up:   Ortho outpatient Incentive spirometry Bowel regimen  Discharge Diagnosis:   Principal Problem:   Hip pain Active Problems:   HLD (hyperlipidemia)   Hypokalemia   Atrial fibrillation (HCC)   Type 2 diabetes mellitus ([place])   Right hip pain    Discharge Condition: Improved.  Diet recommendation: Low sodium, heart healthy.  Carbohydrate-modified.  Wound care: None.  Code status: dnr   History of Present Illness:   [name] [name] is a 75 y.o. female with medical history significant of A. fib on Eliquis, type 2 diabetes, hypertension, hyperlipidemia, anxiety, anemia, obesity (BMI 37.76), GERD, sleep apnea presented to the ED with complaints of severe right hip pain for several days and inability to walk.  In the ED, vital signs stable.  She was given Tylenol, fentanyl, and Dilaudid for pain.  Lidocaine patch applied.  She was given IV potassium 10 mEq x 1.  Labs showing WBC 3.9, hemoglobin 11.0 (stable), platelet count 202K.  Sodium 136, potassium 2.9, chloride 103, bicarb 26, BUN 11, creatinine 0.7, glucose 82.  BNP 129.  UA without signs of infection.  Screening COVID test pending.  X-ray of hip showing moderate degenerative changes and no acute findings.  Patient states she has had pain in her right hip for a long time but it has been worse for the past few days.  She had a fall back in January but denies any recent falls.  Due to her pain, it is difficult for her to ambulate at home.  She lives by herself and her primary care physician is currently working on finding her a bed at an assisted living facility.  States she is seen by an orthopedics and recently had a steroid shot but continues to to have pain  in this hip.   Hospital Course by Problem:   Acute on chronic right hip pain: According to patient, her hip pain is several month old but that has gotten worse since about 1 week.  X-ray showing moderate degenerative changes and no acute findings. She had a fall back in January but denies any recent falls.  -PT/OT and social work consulted to help with placement as patient lives alone and is having difficulty ambulating.  Continue current pain management.   Waiting for SNF placement.  Nausea/vomiting:  -none further  Hypokalemia: repleted  Hypomagnesemia:repleted  Permanent A. Fib: Rate controlled.  Continue amiodarone and Eliquis.  Type 2 diabetes A1c 5.4 on 03/12/2021 and 5.7 on 04/10/2021.   Diet controlled  Hyperlipidemia -Continue Lipitor  GERD -Continue PPI and H2 blocker  Obesity Estimated body mass index is 37.76 kg/m as calculated from the following:   Height as of this encounter: 5' (1.524 m).   Weight as of this encounter: 87.7 kg.    Medical Consultants:      Discharge Exam:   Vitals:   04/20/21 0044 04/20/21 0544  BP: 102/76 121/80  Pulse: 64 64  Resp: 18 18  Temp: 97.6 F (36.4 C) 97.8 F (36.6 C)  SpO2: 94% 95%   Vitals:   04/19/21 1204 04/19/21 1711 04/20/21 0044 04/20/21 0544  BP: 119/70 119/75 102/76 121/80  Pulse: 66 65 64 64  Resp: 18 18 18 18   Temp: 97.9 F (36.6 C) 98.5 F (36.9  C) 97.6 F (36.4 C) 97.8 F (36.6 C)  TempSrc: Oral Oral Oral Oral  SpO2: 96% 97% 94% 95%  Weight:      Height:        General exam: Appears calm and comfortable.   The results of significant diagnostics from this hospitalization (including imaging, microbiology, ancillary and laboratory) are listed below for reference.     Procedures and Diagnostic Studies:   DG Hip Unilat W or Wo Pelvis 2-3 Views Right  Result Date: 04/09/2021 CLINICAL DATA:  Fall EXAM: DG HIP (WITH OR WITHOUT PELVIS) 2-3V RIGHT COMPARISON:  02/19/2021 FINDINGS: SI  joints are non widened. Pubic symphysis and rami appear intact. Left hip replacement with normal alignment and intact hardware. No fracture or malalignment of the right hip. Moderate degenerative changes. IMPRESSION: 1. Status post left hip replacement. 2. No acute osseous abnormality. Electronically Signed   By: Donavan Foil M.D.   On: 04/09/2021 23:33     Labs:   Basic Metabolic Panel: Recent Labs  Lab 04/14/21 0125  NA 138  K 3.7  CL 104  CO2 27  GLUCOSE 107*  BUN 15  CREATININE 0.93  CALCIUM 8.6*  MG 1.6*   GFR Estimated Creatinine Clearance: 50.7 mL/min (by C-G formula based on SCr of 0.93 mg/dL). Liver Function Tests: No results for input(s): AST, ALT, ALKPHOS, BILITOT, PROT, ALBUMIN in the last 168 hours. No results for input(s): LIPASE, AMYLASE in the last 168 hours. No results for input(s): AMMONIA in the last 168 hours. Coagulation profile No results for input(s): INR, PROTIME in the last 168 hours.  CBC: No results for input(s): WBC, NEUTROABS, HGB, HCT, MCV, PLT in the last 168 hours. Cardiac Enzymes: No results for input(s): CKTOTAL, CKMB, CKMBINDEX, TROPONINI in the last 168 hours. BNP: Invalid input(s): POCBNP CBG: Recent Labs  Lab 04/19/21 1126 04/19/21 1618 04/19/21 2135 04/20/21 0621 04/20/21 1113  GLUCAP 100* 149* 90 88 108*   D-Dimer No results for input(s): DDIMER in the last 72 hours. Hgb A1c No results for input(s): HGBA1C in the last 72 hours. Lipid Profile No results for input(s): CHOL, HDL, LDLCALC, TRIG, CHOLHDL, LDLDIRECT in the last 72 hours. Thyroid function studies No results for input(s): TSH, T4TOTAL, T3FREE, THYROIDAB in the last 72 hours.  Invalid input(s): FREET3 Anemia work up No results for input(s): VITAMINB12, FOLATE, FERRITIN, TIBC, IRON, RETICCTPCT in the last 72 hours. Microbiology No results found for this or any previous visit (from the past 240 hour(s)).   Discharge Instructions:   Discharge Instructions     Diet - low sodium heart healthy   Complete by: As directed    Diet Carb Modified   Complete by: As directed      Allergies as of 04/20/2021      Reactions   Ace Inhibitors    Morphine And Related    hallucinations   Codeine Other (See Comments)   headache   Oxycodone-acetaminophen Nausea And Vomiting      Medication List    STOP taking these medications   methocarbamol 500 MG tablet Commonly known as: ROBAXIN     TAKE these medications   acetaminophen 650 MG CR tablet Commonly known as: TYLENOL Take 1,300 mg by mouth every 8 (eight) hours as needed for pain.   ALPRAZolam 0.25 MG tablet Commonly known as: XANAX Take 1 tablet (0.25 mg total) by mouth 2 (two) times daily as needed for anxiety.   amiodarone 200 MG tablet Commonly known as: PACERONE Take 200  mg by mouth 2 (two) times daily.   apixaban 5 MG Tabs tablet Commonly known as: ELIQUIS Take 1 tablet (5 mg total) by mouth 2 (two) times daily.   diclofenac Sodium 1 % Gel Commonly known as: VOLTAREN Apply 4 g topically 4 (four) times daily.   famotidine 40 MG tablet Commonly known as: PEPCID Take 40 mg by mouth 2 (two) times daily.   ferrous sulfate 325 (65 FE) MG tablet Take 325 mg by mouth 3 (three) times daily with meals.   FLUoxetine 40 MG capsule Commonly known as: PROZAC Take 40 mg by mouth daily.   HYDROcodone-acetaminophen 5-325 MG tablet Commonly known as: NORCO/VICODIN Take 1 tablet by mouth every 6 (six) hours as needed for moderate pain.   lidocaine 5 % Commonly known as: LIDODERM Place 1 patch onto the skin daily. Remove & Discard patch within 12 hours or as directed by MD   Magnesium Oxide 400 MG Caps Take 1 capsule (400 mg total) by mouth daily.   omeprazole 40 MG capsule Commonly known as: PRILOSEC Take 40 mg by mouth daily.   One-A-Day Womens 50+ Tabs Take 1 tablet by mouth daily.   polyethylene glycol powder 17 GM/SCOOP powder Commonly known as: GLYCOLAX/MIRALAX Take 17 g by  mouth daily.   simvastatin 80 MG tablet Commonly known as: ZOCOR Take 80 mg by mouth at bedtime.       Contact information for after-discharge care    Destination    [facility] .   Service: Skilled Nursing Contact information: [place] [phone]                   Time coordinating discharge: 35 min  Signed:  Geradine Girt DO  [facility] 04/20/2021, 11:29 AM

## 2021-04-20 NOTE — Care Management Important Message (Signed)
Important Message  Patient Details  Name: [name] [name] MRN: [id number] Date of Birth: 04-Feb-1946   Medicare Important Message Given:  Yes     [name] 04/20/2021, 2:08 PM

## 2021-04-20 NOTE — TOC Transition Note (Signed)
Transition of Care [facility]) - CM/SW Discharge Note   Patient Details  Name: [name] [name] MRN: [id number] Date of Birth: 1946-06-26  [facility] CM/SW Contact:  Emeterio Reeve, [place] Phone Number: 04/20/2021, 2:07 PM   Clinical Narrative:     Patient will DC to: [facility] Anticipated DC date: 04/20/21 Family notified:  Transport by: Corey Harold     Per MD patient ready for DC to [facility]. RN, patient, patient's family, and facility notified of DC. Discharge Summary and FL2 sent to facility. DC packet on chart. Facility stated pt does not need a new covid test, they will administer one on admission. Facility has received insurance auth. Ambulance transport requested for patient.    RN to call report to [phone].  CSW will sign off for now as social work intervention is no longer needed. Please consult [place] again if new needs arise.   Final next level of care: Skilled Nursing Facility Barriers to Discharge: Barriers Resolved   Patient Goals and CMS Choice Patient states their goals for this hospitalization and ongoing recovery are:: SNF for rehab CMS Medicare.gov Compare Post Acute Care list provided to:: Patient Choice offered to / list presented to : Patient  Discharge Placement              Patient chooses bed at: Other - please specify in the comment section below: ([facility]) Patient to be transferred to facility by: ptar   Patient and family notified of of transfer: 04/20/21  Discharge Plan and Services                                     Social Determinants of Health (SDOH) Interventions     Readmission Risk Interventions No flowsheet data found.   Emeterio Reeve, Latanya Presser, [place] Social Worker [phone]

## 2021-04-20 NOTE — Progress Notes (Signed)
Patient has ordered for discharge in [place]. Report given to Nurse April in [place]. All belongings given to the patient. IV removed. [facility] is here for pick up her.

## 2021-07-07 DIAGNOSIS — Z85038 Personal history of other malignant neoplasm of large intestine: Secondary | ICD-10-CM

## 2021-07-07 DIAGNOSIS — G252 Other specified forms of tremor: Secondary | ICD-10-CM | POA: Diagnosis present

## 2021-07-07 DIAGNOSIS — R339 Retention of urine, unspecified: Secondary | ICD-10-CM | POA: Diagnosis not present

## 2021-07-07 DIAGNOSIS — M810 Age-related osteoporosis without current pathological fracture: Secondary | ICD-10-CM | POA: Diagnosis present

## 2021-07-07 DIAGNOSIS — M2578 Osteophyte, vertebrae: Secondary | ICD-10-CM | POA: Diagnosis present

## 2021-07-07 DIAGNOSIS — E669 Obesity, unspecified: Secondary | ICD-10-CM | POA: Diagnosis present

## 2021-07-07 DIAGNOSIS — Z7901 Long term (current) use of anticoagulants: Secondary | ICD-10-CM

## 2021-07-07 DIAGNOSIS — S7291XA Unspecified fracture of right femur, initial encounter for closed fracture: Secondary | ICD-10-CM | POA: Diagnosis not present

## 2021-07-07 DIAGNOSIS — R197 Diarrhea, unspecified: Secondary | ICD-10-CM | POA: Diagnosis not present

## 2021-07-07 DIAGNOSIS — Z66 Do not resuscitate: Secondary | ICD-10-CM | POA: Diagnosis present

## 2021-07-07 DIAGNOSIS — M4802 Spinal stenosis, cervical region: Secondary | ICD-10-CM | POA: Diagnosis present

## 2021-07-07 DIAGNOSIS — D1809 Hemangioma of other sites: Secondary | ICD-10-CM | POA: Diagnosis present

## 2021-07-07 DIAGNOSIS — M47812 Spondylosis without myelopathy or radiculopathy, cervical region: Secondary | ICD-10-CM | POA: Diagnosis present

## 2021-07-07 DIAGNOSIS — F32A Depression, unspecified: Secondary | ICD-10-CM | POA: Diagnosis present

## 2021-07-07 DIAGNOSIS — E119 Type 2 diabetes mellitus without complications: Secondary | ICD-10-CM | POA: Diagnosis present

## 2021-07-07 DIAGNOSIS — I10 Essential (primary) hypertension: Secondary | ICD-10-CM | POA: Diagnosis present

## 2021-07-07 DIAGNOSIS — K5900 Constipation, unspecified: Secondary | ICD-10-CM | POA: Diagnosis present

## 2021-07-07 DIAGNOSIS — Z20822 Contact with and (suspected) exposure to covid-19: Secondary | ICD-10-CM | POA: Diagnosis present

## 2021-07-07 DIAGNOSIS — I700 Atherosclerosis of aorta: Secondary | ICD-10-CM | POA: Diagnosis present

## 2021-07-07 DIAGNOSIS — W010XXA Fall on same level from slipping, tripping and stumbling without subsequent striking against object, initial encounter: Secondary | ICD-10-CM | POA: Diagnosis present

## 2021-07-07 DIAGNOSIS — K219 Gastro-esophageal reflux disease without esophagitis: Secondary | ICD-10-CM | POA: Diagnosis present

## 2021-07-07 DIAGNOSIS — Z96642 Presence of left artificial hip joint: Secondary | ICD-10-CM | POA: Diagnosis present

## 2021-07-07 DIAGNOSIS — Z825 Family history of asthma and other chronic lower respiratory diseases: Secondary | ICD-10-CM

## 2021-07-07 DIAGNOSIS — G4730 Sleep apnea, unspecified: Secondary | ICD-10-CM | POA: Diagnosis present

## 2021-07-07 DIAGNOSIS — Z833 Family history of diabetes mellitus: Secondary | ICD-10-CM

## 2021-07-07 DIAGNOSIS — D649 Anemia, unspecified: Secondary | ICD-10-CM | POA: Diagnosis present

## 2021-07-07 DIAGNOSIS — Z888 Allergy status to other drugs, medicaments and biological substances status: Secondary | ICD-10-CM

## 2021-07-07 DIAGNOSIS — M25461 Effusion, right knee: Secondary | ICD-10-CM | POA: Diagnosis present

## 2021-07-07 DIAGNOSIS — F419 Anxiety disorder, unspecified: Secondary | ICD-10-CM | POA: Diagnosis present

## 2021-07-07 DIAGNOSIS — M50223 Other cervical disc displacement at C6-C7 level: Secondary | ICD-10-CM | POA: Diagnosis present

## 2021-07-07 DIAGNOSIS — Z885 Allergy status to narcotic agent status: Secondary | ICD-10-CM

## 2021-07-07 DIAGNOSIS — Z79899 Other long term (current) drug therapy: Secondary | ICD-10-CM

## 2021-07-07 DIAGNOSIS — E041 Nontoxic single thyroid nodule: Secondary | ICD-10-CM | POA: Diagnosis present

## 2021-07-07 DIAGNOSIS — Z8673 Personal history of transient ischemic attack (TIA), and cerebral infarction without residual deficits: Secondary | ICD-10-CM

## 2021-07-07 DIAGNOSIS — R531 Weakness: Secondary | ICD-10-CM | POA: Diagnosis present

## 2021-07-07 DIAGNOSIS — I4891 Unspecified atrial fibrillation: Secondary | ICD-10-CM | POA: Diagnosis present

## 2021-07-07 DIAGNOSIS — Z87891 Personal history of nicotine dependence: Secondary | ICD-10-CM

## 2021-07-07 DIAGNOSIS — M4313 Spondylolisthesis, cervicothoracic region: Secondary | ICD-10-CM | POA: Diagnosis present

## 2021-07-07 DIAGNOSIS — R296 Repeated falls: Secondary | ICD-10-CM | POA: Diagnosis present

## 2021-07-07 DIAGNOSIS — Z9119 Patient's noncompliance with other medical treatment and regimen: Secondary | ICD-10-CM

## 2021-07-07 DIAGNOSIS — R0781 Pleurodynia: Secondary | ICD-10-CM | POA: Diagnosis present

## 2021-07-07 DIAGNOSIS — Z886 Allergy status to analgesic agent status: Secondary | ICD-10-CM

## 2021-07-07 DIAGNOSIS — M1711 Unilateral primary osteoarthritis, right knee: Secondary | ICD-10-CM | POA: Diagnosis present

## 2021-07-07 DIAGNOSIS — S72451A Displaced supracondylar fracture without intracondylar extension of lower end of right femur, initial encounter for closed fracture: Principal | ICD-10-CM | POA: Diagnosis present

## 2021-07-07 DIAGNOSIS — Z751 Person awaiting admission to adequate facility elsewhere: Secondary | ICD-10-CM

## 2021-07-07 NOTE — ED Provider Notes (Signed)
Emergency Medicine Provider Triage Evaluation Note  [name] [name] , a 75 y.o. female  was evaluated in triage.  Pt complains of right knee pain.  The patient reports worsening right knee pain over the last week.  She has a history of osteoarthritis in the right knee.  Reports that her leg has been giving out and she has fallen several times.  Last fall was on Friday when she fell getting off of the potty chair.  She denies LOC or hitting her head.  She lives alone and now is unable to ambulate independently, which prompted her call to EMS tonight.  Reports that she has voided once today.  No fever, vomiting, rashes, or incontinence.  Patient states that she has a longstanding history of chest pain or shortness of breath, but the symptoms are not worse or unchanged today.  Review of Systems  Positive: Arthralgias, myalgias, decreased urinary output Negative: Fever, chills, urinary fecal incontinence, vomiting, rash, syncope  Physical Exam  There were no vitals taken for this visit. Gen:   Awake, no distress   Resp:  Normal effort  MSK:   Moves extremities without difficulty  Other:  Right knee is swollen.  No redness or warmth.  Sensation is intact and equal to the bilateral lower extremities.  No obvious head trauma.  Medical Decision Making  Medically screening exam initiated at 11:57 PM.  Appropriate orders placed.  [name] [name] was informed that the remainder of the evaluation will be completed by another provider, this initial triage assessment does not replace that evaluation, and the importance of remaining in the ED until their evaluation is complete.  Patient's work-up has been initiated in the emergency department.  She will require further work-up and evaluation.   Joline Maxcy A, PA-C 07/08/21 0009    Orpah Greek, MD 07/08/21 [phone]

## 2021-07-07 NOTE — ED Triage Notes (Signed)
Pt c/o exacerbated chronic arthritis pain in R knee x2 days. Hx HTN, DM, arthritis, OSA, noncompliant w medication. Formerly unwilling to go to SNF, states willingness to go if placed again.

## 2021-07-08 ENCOUNTER — Inpatient Hospital Stay (HOSPITAL_COMMUNITY)
Admission: EM | Admit: 2021-07-08 | Discharge: 2021-07-17 | DRG: 482 | Disposition: A | Discharge status: Skilled Nursing Facility | Payer: Medicare HMO | Attending: Internal Medicine | Admitting: Internal Medicine

## 2021-07-08 ENCOUNTER — Emergency Department (HOSPITAL_COMMUNITY): Payer: Medicare HMO

## 2021-07-08 ENCOUNTER — Encounter (HOSPITAL_COMMUNITY): Payer: Self-pay | Admitting: Internal Medicine

## 2021-07-08 ENCOUNTER — Other Ambulatory Visit: Payer: Self-pay

## 2021-07-08 ENCOUNTER — Inpatient Hospital Stay (HOSPITAL_COMMUNITY): Payer: Medicare HMO

## 2021-07-08 DIAGNOSIS — D1809 Hemangioma of other sites: Secondary | ICD-10-CM | POA: Diagnosis present

## 2021-07-08 DIAGNOSIS — K219 Gastro-esophageal reflux disease without esophagitis: Secondary | ICD-10-CM | POA: Diagnosis present

## 2021-07-08 DIAGNOSIS — K5900 Constipation, unspecified: Secondary | ICD-10-CM | POA: Diagnosis present

## 2021-07-08 DIAGNOSIS — M1711 Unilateral primary osteoarthritis, right knee: Secondary | ICD-10-CM | POA: Diagnosis present

## 2021-07-08 DIAGNOSIS — E119 Type 2 diabetes mellitus without complications: Secondary | ICD-10-CM

## 2021-07-08 DIAGNOSIS — I4891 Unspecified atrial fibrillation: Secondary | ICD-10-CM | POA: Diagnosis present

## 2021-07-08 DIAGNOSIS — W010XXA Fall on same level from slipping, tripping and stumbling without subsequent striking against object, initial encounter: Secondary | ICD-10-CM | POA: Diagnosis present

## 2021-07-08 DIAGNOSIS — F32A Depression, unspecified: Secondary | ICD-10-CM | POA: Diagnosis present

## 2021-07-08 DIAGNOSIS — S7291XA Unspecified fracture of right femur, initial encounter for closed fracture: Secondary | ICD-10-CM | POA: Diagnosis present

## 2021-07-08 DIAGNOSIS — G252 Other specified forms of tremor: Secondary | ICD-10-CM | POA: Diagnosis present

## 2021-07-08 DIAGNOSIS — Z20822 Contact with and (suspected) exposure to covid-19: Secondary | ICD-10-CM | POA: Diagnosis present

## 2021-07-08 DIAGNOSIS — M2578 Osteophyte, vertebrae: Secondary | ICD-10-CM | POA: Diagnosis present

## 2021-07-08 DIAGNOSIS — G4730 Sleep apnea, unspecified: Secondary | ICD-10-CM | POA: Diagnosis present

## 2021-07-08 DIAGNOSIS — M4313 Spondylolisthesis, cervicothoracic region: Secondary | ICD-10-CM | POA: Diagnosis present

## 2021-07-08 DIAGNOSIS — I10 Essential (primary) hypertension: Secondary | ICD-10-CM | POA: Diagnosis present

## 2021-07-08 DIAGNOSIS — R0781 Pleurodynia: Secondary | ICD-10-CM

## 2021-07-08 DIAGNOSIS — S72451A Displaced supracondylar fracture without intracondylar extension of lower end of right femur, initial encounter for closed fracture: Secondary | ICD-10-CM | POA: Diagnosis present

## 2021-07-08 DIAGNOSIS — M47812 Spondylosis without myelopathy or radiculopathy, cervical region: Secondary | ICD-10-CM | POA: Diagnosis present

## 2021-07-08 DIAGNOSIS — Z66 Do not resuscitate: Secondary | ICD-10-CM | POA: Diagnosis present

## 2021-07-08 DIAGNOSIS — Z419 Encounter for procedure for purposes other than remedying health state, unspecified: Secondary | ICD-10-CM

## 2021-07-08 DIAGNOSIS — E669 Obesity, unspecified: Secondary | ICD-10-CM | POA: Diagnosis present

## 2021-07-08 DIAGNOSIS — E041 Nontoxic single thyroid nodule: Secondary | ICD-10-CM | POA: Diagnosis present

## 2021-07-08 DIAGNOSIS — R531 Weakness: Secondary | ICD-10-CM

## 2021-07-08 DIAGNOSIS — M50223 Other cervical disc displacement at C6-C7 level: Secondary | ICD-10-CM | POA: Diagnosis present

## 2021-07-08 DIAGNOSIS — M810 Age-related osteoporosis without current pathological fracture: Secondary | ICD-10-CM | POA: Diagnosis present

## 2021-07-08 DIAGNOSIS — F419 Anxiety disorder, unspecified: Secondary | ICD-10-CM | POA: Diagnosis present

## 2021-07-08 DIAGNOSIS — T148XXA Other injury of unspecified body region, initial encounter: Secondary | ICD-10-CM

## 2021-07-08 DIAGNOSIS — D649 Anemia, unspecified: Secondary | ICD-10-CM | POA: Diagnosis present

## 2021-07-08 DIAGNOSIS — I700 Atherosclerosis of aorta: Secondary | ICD-10-CM | POA: Diagnosis present

## 2021-07-08 DIAGNOSIS — R338 Other retention of urine: Secondary | ICD-10-CM | POA: Clinically undetermined

## 2021-07-08 LAB — COMPREHENSIVE METABOLIC PANEL
ALT: 18 U/L (ref 0–44)
AST: 20 U/L (ref 15–41)
Albumin: 3.4 g/dL — ABNORMAL LOW (ref 3.5–5.0)
Alkaline Phosphatase: 99 U/L (ref 38–126)
Anion gap: 12 (ref 5–15)
BUN: 12 mg/dL (ref 8–23)
CO2: 22 mmol/L (ref 22–32)
Calcium: 9 mg/dL (ref 8.9–10.3)
Chloride: 101 mmol/L (ref 98–111)
Creatinine, Ser: 0.66 mg/dL (ref 0.44–1.00)
GFR, Estimated: >60 mL/min (ref >60)
Glucose, Bld: 126 mg/dL — ABNORMAL HIGH (ref 70–99)
Potassium: 4.1 mmol/L (ref 3.5–5.1)
Sodium: 135 mmol/L (ref 135–145)
Total Bilirubin: 0.5 mg/dL (ref 0.3–1.2)
Total Protein: 6.3 g/dL — ABNORMAL LOW (ref 6.5–8.1)

## 2021-07-08 LAB — CBC WITH DIFFERENTIAL/PLATELET
Abs Immature Granulocytes: 0.04 10*3/uL (ref 0.00–0.07)
Basophils Absolute: 0 10*3/uL (ref 0.0–0.1)
Basophils Relative: 0 %
Eosinophils Absolute: 0 10*3/uL (ref 0.0–0.5)
Eosinophils Relative: 0 %
HCT: 35 % — ABNORMAL LOW (ref 36.0–46.0)
Hemoglobin: 11.3 g/dL — ABNORMAL LOW (ref 12.0–15.0)
Immature Granulocytes: 1 %
Lymphocytes Relative: 13 %
Lymphs Abs: 1 10*3/uL (ref 0.7–4.0)
MCH: 30.5 pg (ref 26.0–34.0)
MCHC: 32.3 g/dL (ref 30.0–36.0)
MCV: 94.6 fL (ref 80.0–100.0)
Monocytes Absolute: 0.6 10*3/uL (ref 0.1–1.0)
Monocytes Relative: 7 %
Neutro Abs: 6.2 10*3/uL (ref 1.7–7.7)
Neutrophils Relative %: 79 %
Platelets: 302 10*3/uL (ref 150–400)
RBC: 3.7 MIL/uL — ABNORMAL LOW (ref 3.87–5.11)
RDW: 14.9 % (ref 11.5–15.5)
WBC: 7.9 10*3/uL (ref 4.0–10.5)
nRBC: 0 % (ref 0.0–0.2)

## 2021-07-08 LAB — TROPONIN I (HIGH SENSITIVITY)
Troponin I (High Sensitivity): 7 ng/L (ref <18)
Troponin I (High Sensitivity): 7 ng/L (ref <18)

## 2021-07-08 LAB — RESP PANEL BY RT-PCR (FLU A&B, COVID) ARPGX2
Influenza A by PCR: NEGATIVE
Influenza B by PCR: NEGATIVE
SARS Coronavirus 2 by RT PCR: NEGATIVE

## 2021-07-08 IMAGING — CT CT HEAD W/O CM
4 of 5 series · 15 of 47 positions shown, 17 images · non-contrast
Comparison: CT head [DATE]

CLINICAL DATA: Fall, neck trauma, knee fracture

EXAM:
CT HEAD WITHOUT CONTRAST
CT CERVICAL SPINE WITHOUT CONTRAST
TECHNIQUE: Multidetector CT imaging of the head and cervical spine was
performed following the standard protocol without intravenous
contrast. Multiplanar CT image reconstructions of the cervical spine
were also generated.

[Series 1: head wo · axial · 0.46mm/px · z∈[-158,-43]mm · 5 of 35 slices shown, 7 images (1 of 2)]
[im 6/35  brain]
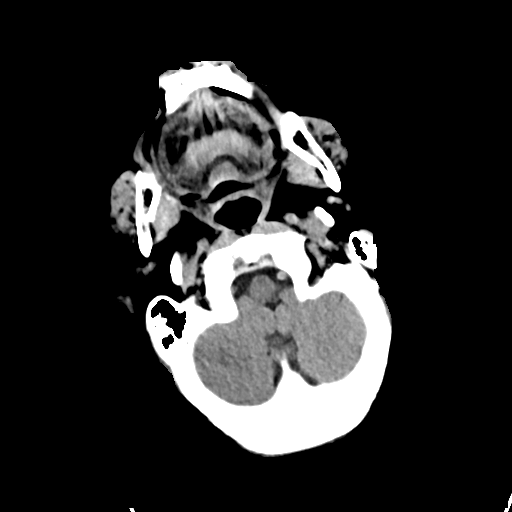
[im 6/35  bone]
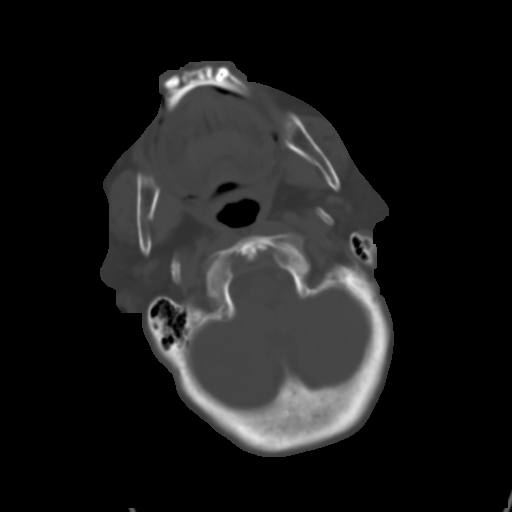
[im 12/35  brain]
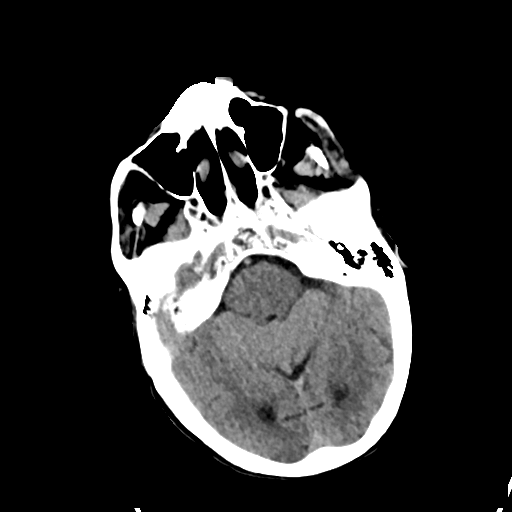
[im 18/35  brain]
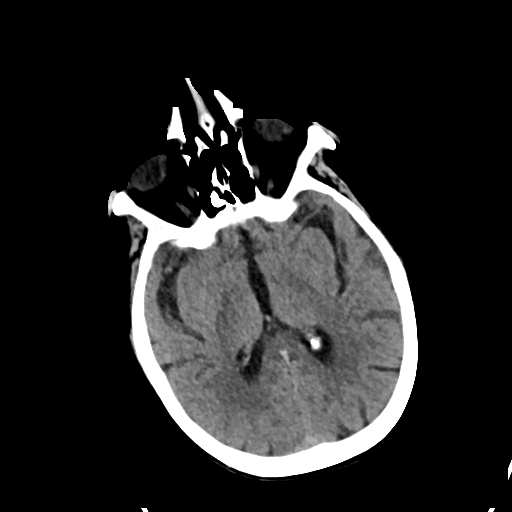
[im 23/35  brain]
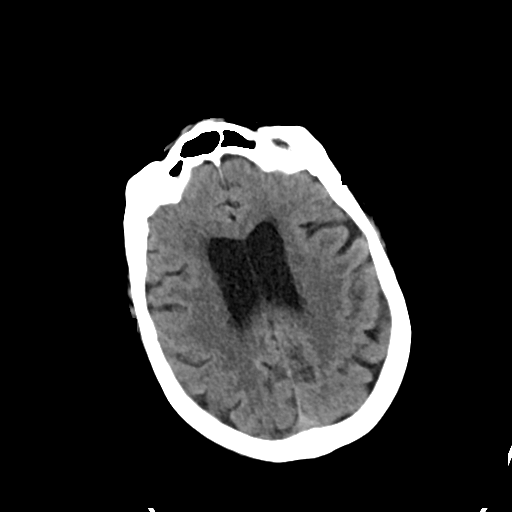
[im 29/35  brain]
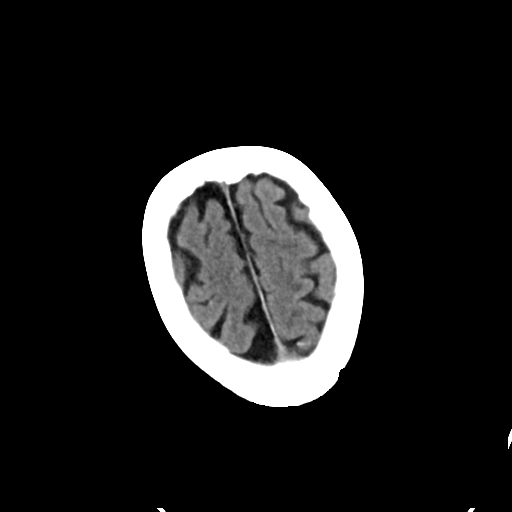
[im 29/35  bone]
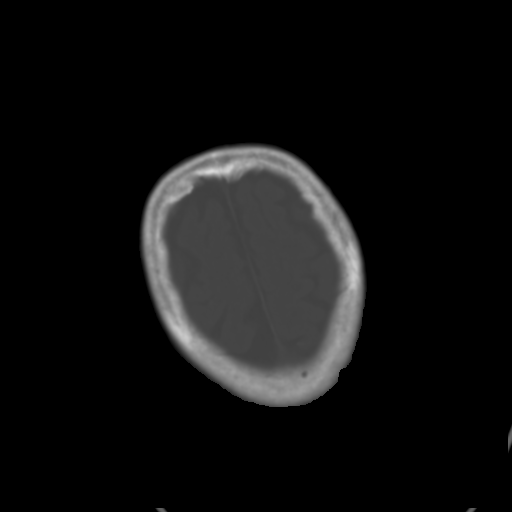

[Series 5: cor soft · coronal · 0.34mm/px · 3 of 98 slices shown]
[im 38/98  brain]
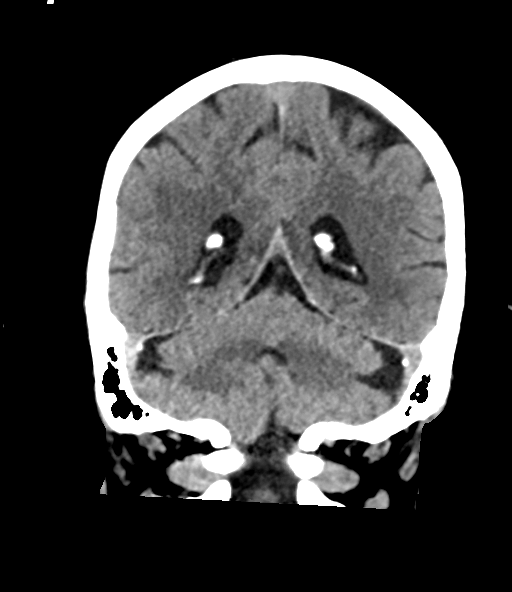
[im 45/98  brain]
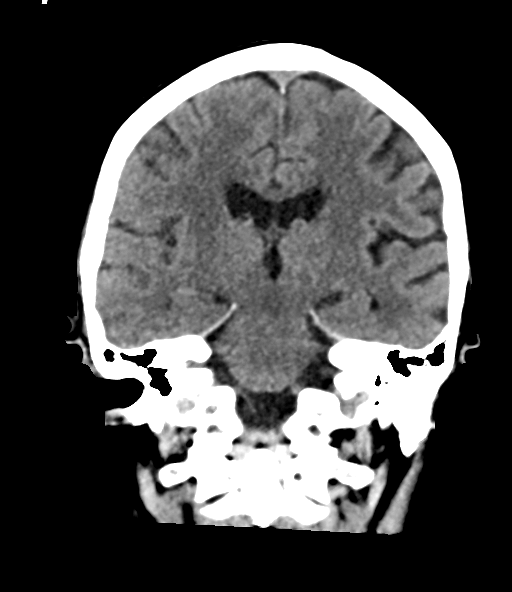
[im 53/98  brain]
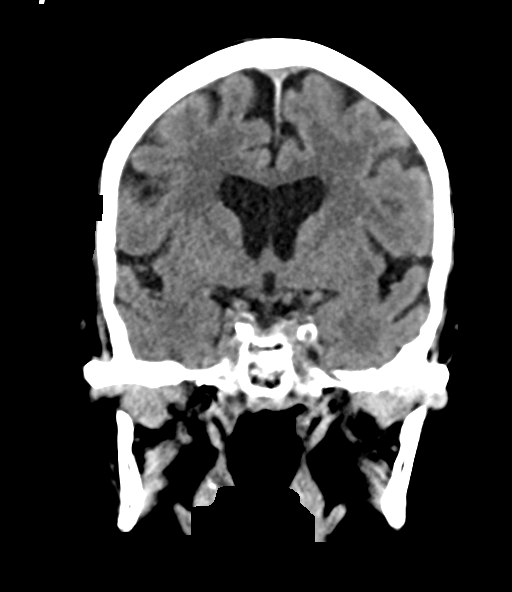

[Series 6: sag soft · sagittal · 0.39mm/px · 3 of 58 slices shown]
[im 20/58  brain]
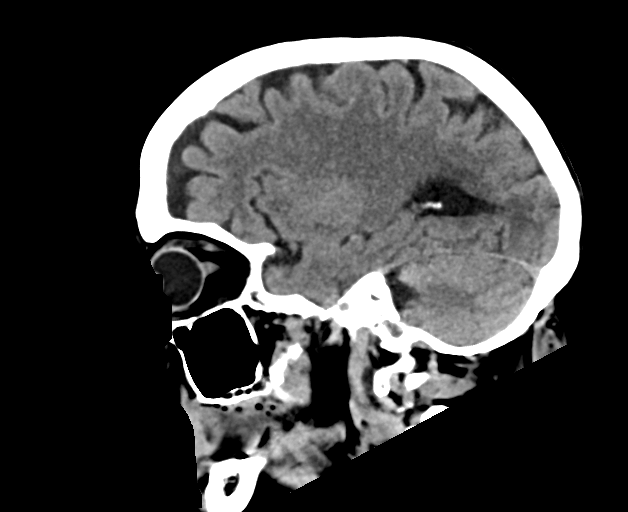
[im 29/58  brain]
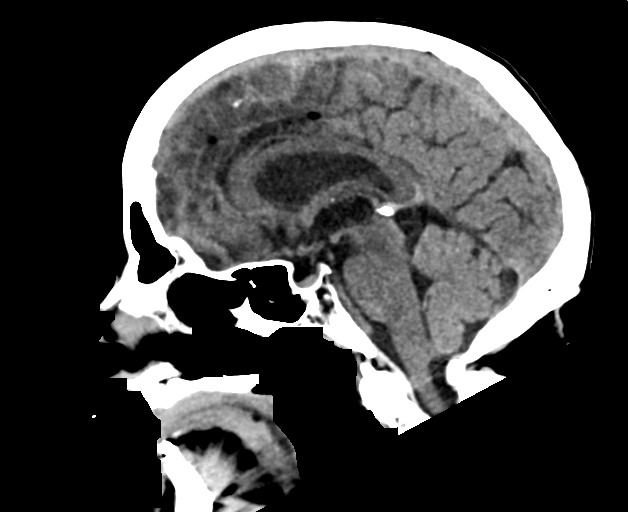
[im 39/58  brain]
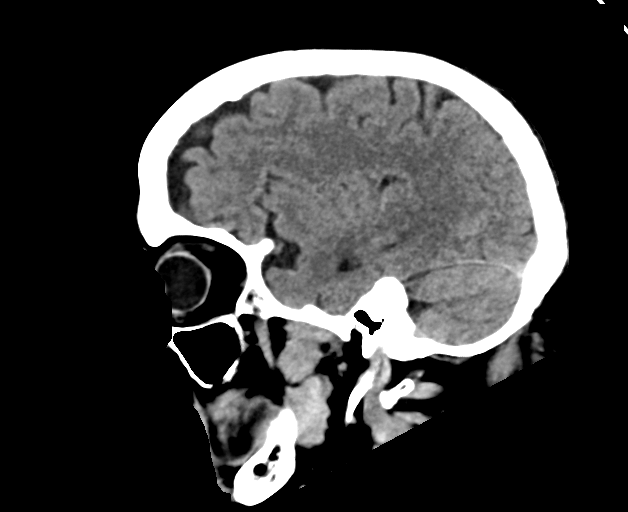

[Series 7: head wo · axial · 0.34mm/px · z∈[-121,-33]mm · 4 of 40 slices shown (2 of 2)]
[im 7/40  brain]
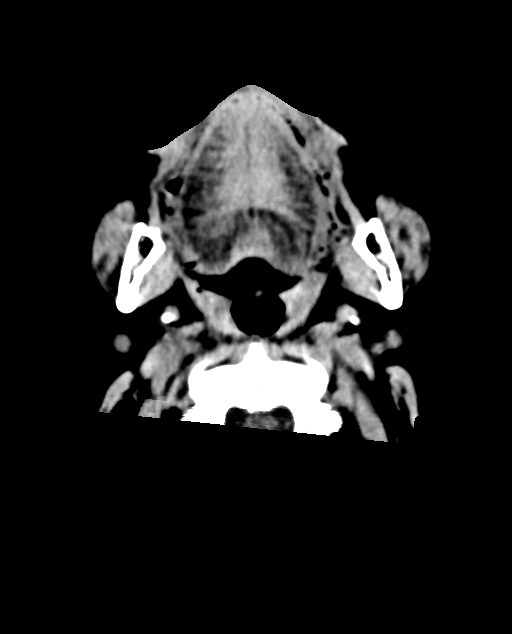
[im 14/40  brain]
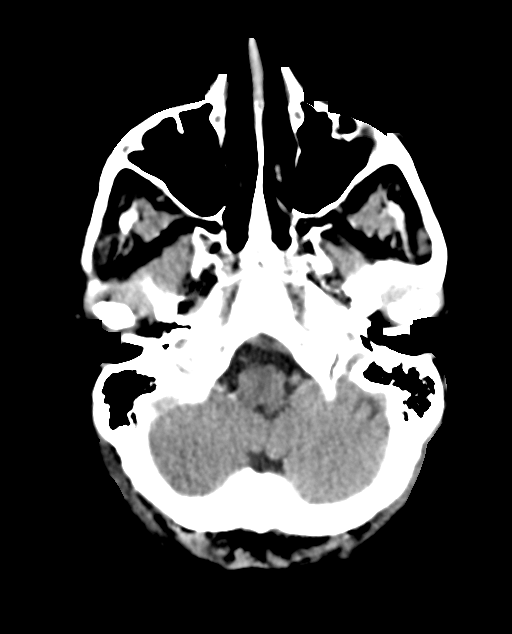
[im 20/40  brain]
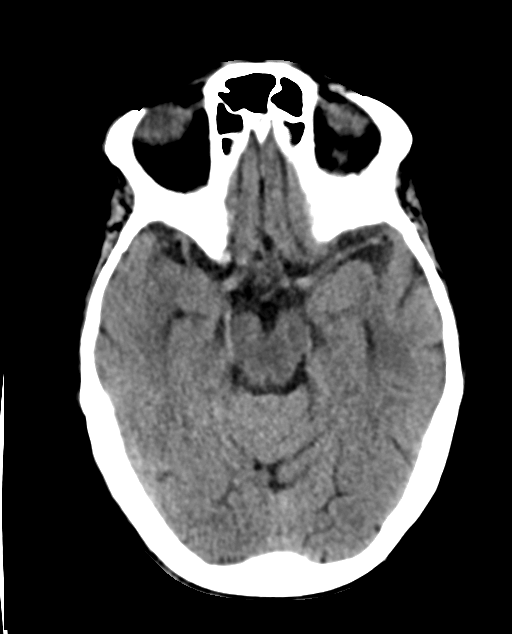
[im 27/40  brain]
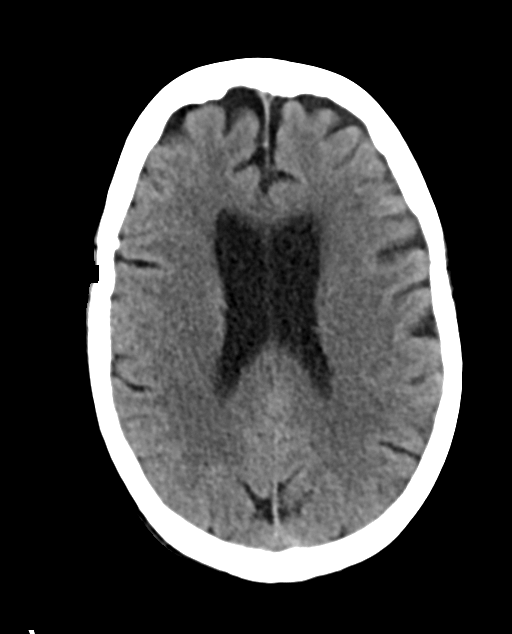

[15 of 47 positions shown; findings below may reference images not displayed]

FINDINGS: CT HEAD FINDINGS

Brain: Generalized atrophy. Normal ventricular morphology. No
midline shift or mass effect. Minor small vessel chronic ischemic
changes of deep cerebral white matter. No intracranial hemorrhage,
mass lesion or evidence of acute infarction. No extra-axial fluid
collections.

Vascular: Atherosclerotic calcification of internal carotid and
vertebral arteries at skull base

Skull: Intact

Sinuses/Orbits: Small mucosal retention cyst RIGHT sphenoid sinus.
Remaining visualized paranasal sinuses and mastoid air cells clear

Other: N/A

CT CERVICAL SPINE FINDINGS

Alignment: Normal

Skull base and vertebrae: Osseous demineralization. Skull base
intact. Scattered disc space narrowing with disc space narrowing and
endplate spur formation at C5-C6. Scattered narrowing of remaining
cervical disc spaces. Vertebral body heights maintained without
fracture, subluxation, or bone destruction.

Soft tissues and spinal canal: Prevertebral soft tissues normal
thickness. Atherosclerotic calcification aortic arch and proximal
great vessels.

Disc levels:  No specific abnormalities

Upper chest: Lung apices clear

Other: N/A
IMPRESSION: No acute intracranial abnormalities.

Degenerative disc and facet disease changes cervical spine.

No acute cervical spine abnormalities.

Aortic Atherosclerosis ([RQ]-[RQ]).

## 2021-07-08 IMAGING — DX DG CHEST 1V PORT
1 series · 1 of 1 positions shown · non-contrast
Comparison: Chest radiograph [DATE]

CLINICAL DATA: Chest pain, sternum tender to palpation

EXAM:
PORTABLE CHEST 1 VIEW

[chest ap]
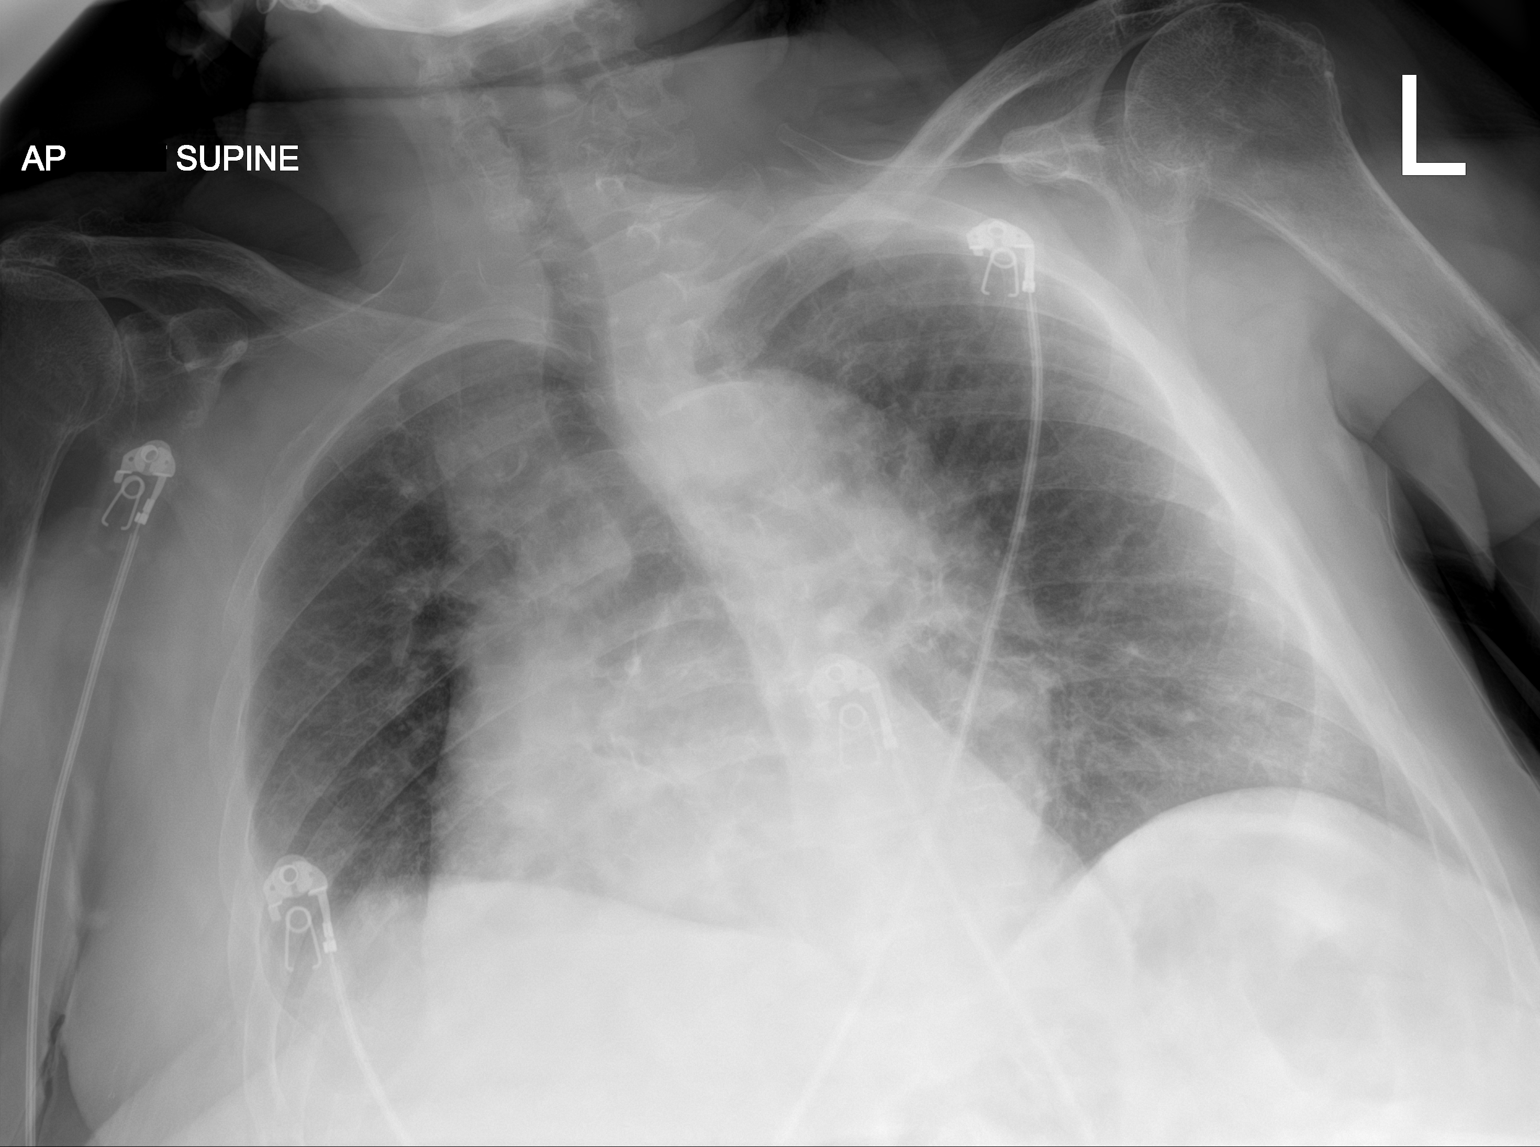

[1 of 1 positions shown; findings below may reference images not displayed]

FINDINGS: The patient is rotated to the right. The heart size is stable to
prior studies allowing for this rotation. The mediastinum appears
widened, exaggerated by low lung volumes and AP technique, similar
in appearance to the prior study dated [DATE]. Calcified
atherosclerotic plaque is seen at the aortic arch.

Lung volumes are low. There is no focal consolidation or pulmonary
edema. There is no pleural effusion or pneumothorax.

No acute osseous abnormality is identified.
IMPRESSION: 1. Low lung volumes. Otherwise, no radiographic evidence of acute
cardiopulmonary process.
2. No fracture identified.

## 2021-07-08 IMAGING — CR DG KNEE COMPLETE 4+V*R*
4 series · 4 of 4 positions shown · non-contrast
Comparison: None.

CLINICAL DATA: Recent fall with right knee pain, initial encounter

EXAM:
RIGHT KNEE - COMPLETE 4+ VIEW

[knee ap]
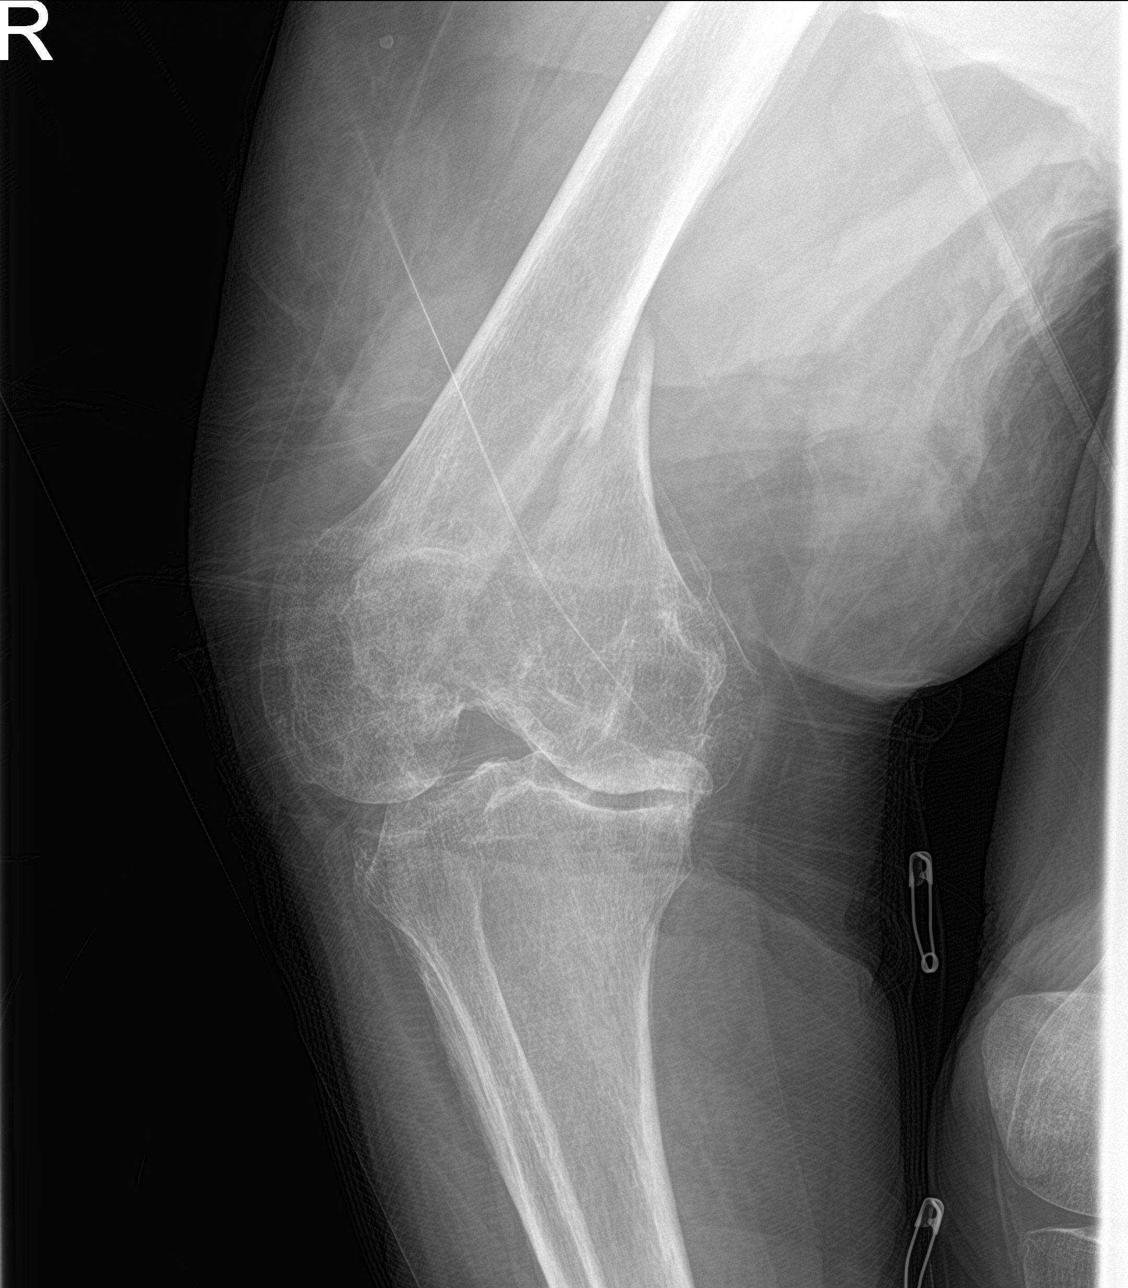

[knee lat (1 of 2)]
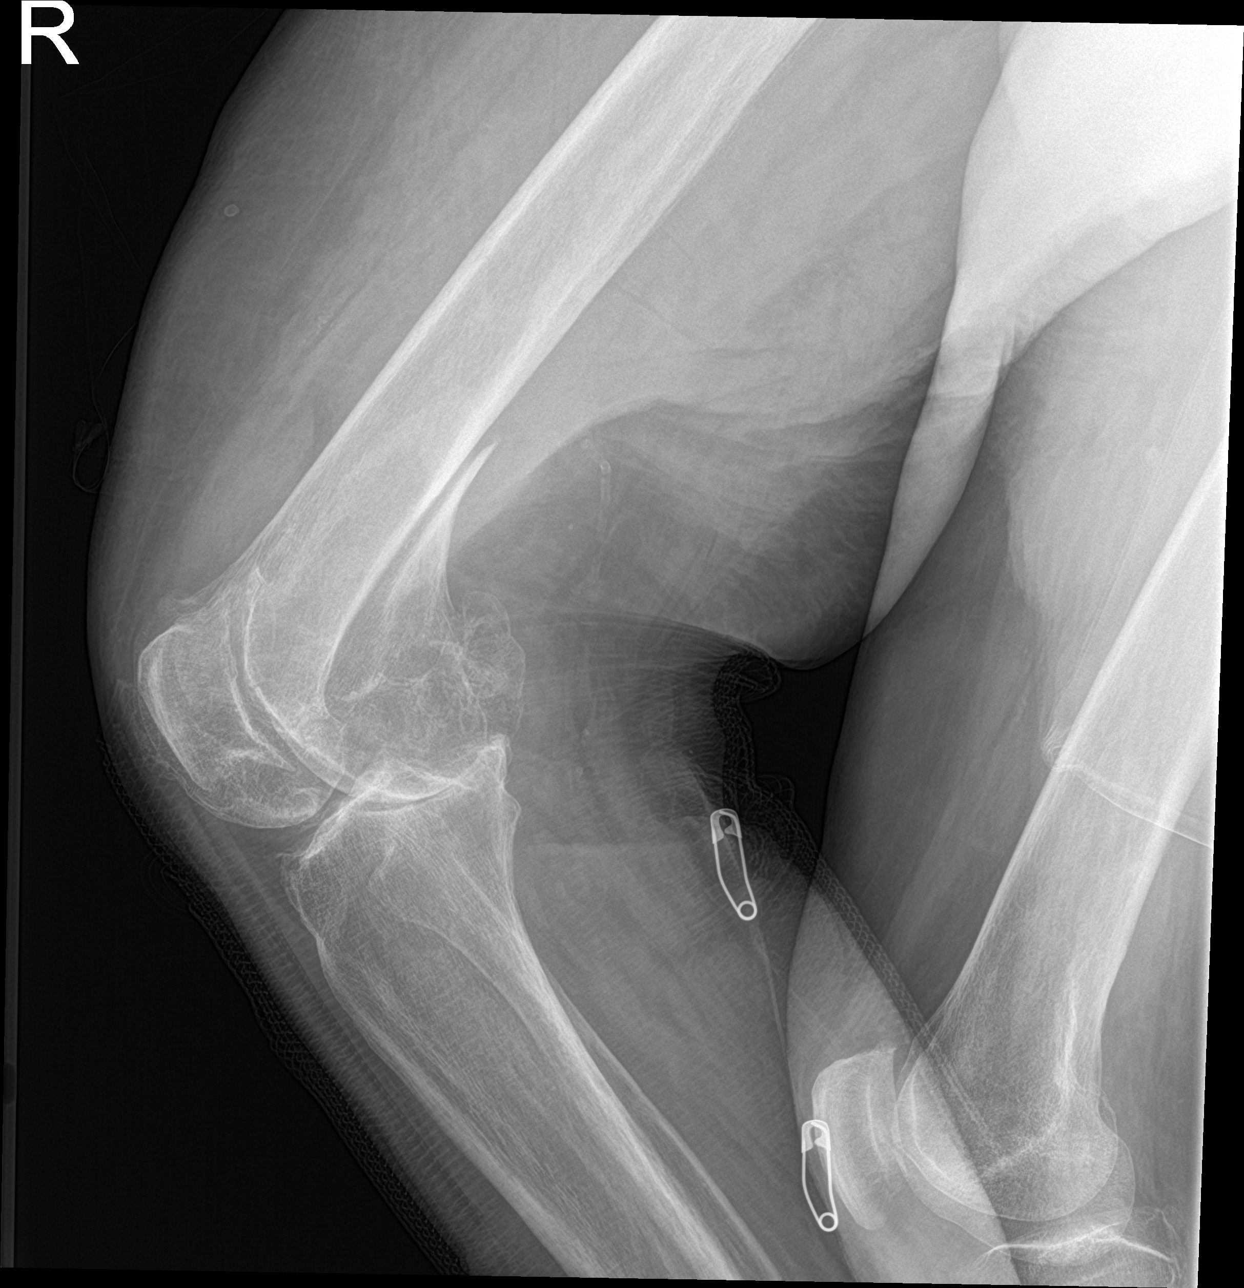

[knee lat (2 of 2)]
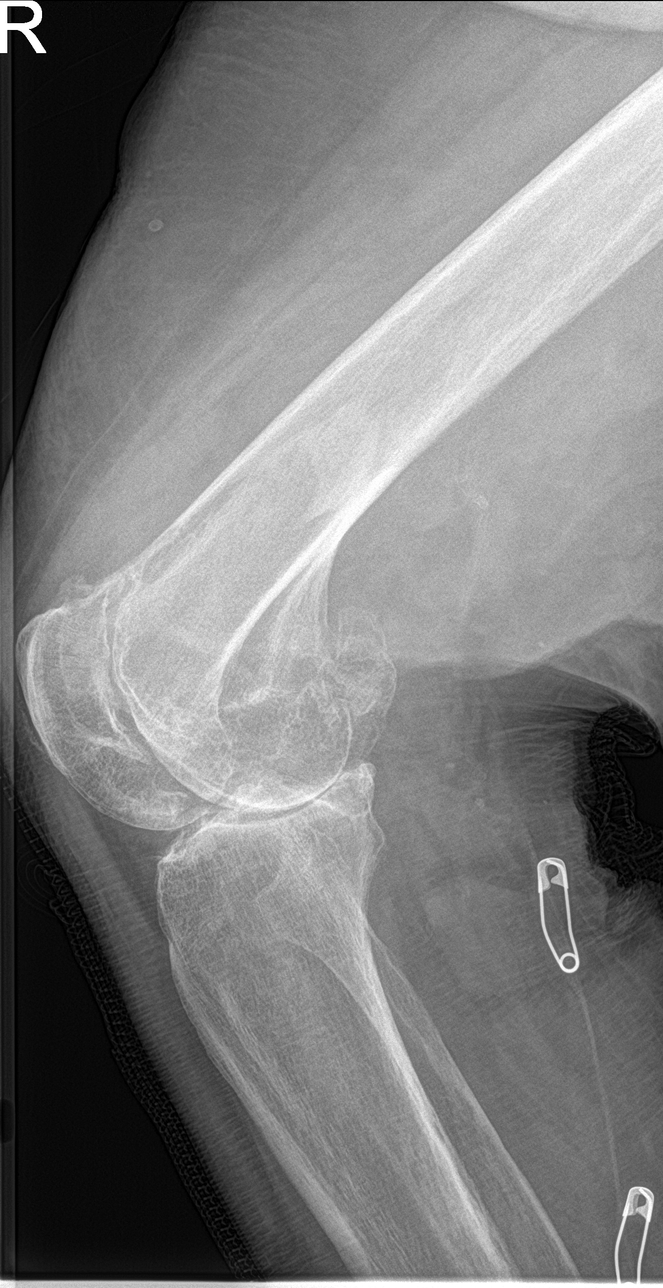

[knee obl]
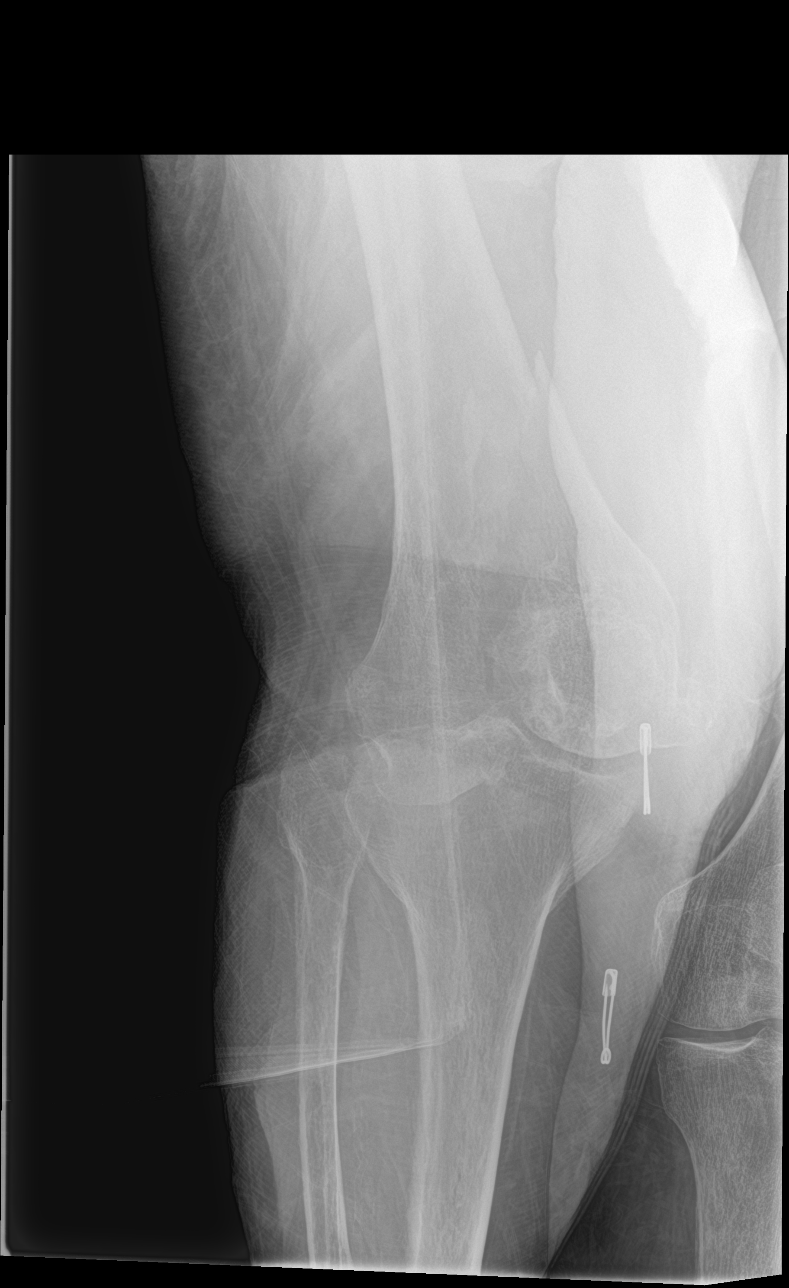

[4 of 4 positions shown; findings below may reference images not displayed]

FINDINGS: There is a an oblique fracture through the distal right femoral
metaphysis and extending into the intercondylar notch separating the
lateral and medial condyles. Tricompartmental degenerative changes
are seen. Small joint effusion is noted.
IMPRESSION: Oblique fracture through the distal femur extending into the
intercondylar notch. Small joint effusion is seen.

Tricompartmental degenerative changes are noted.

## 2021-07-08 IMAGING — DX DG RIBS 2V*L*
2 series · 2 of 2 positions shown · non-contrast
Comparison: None.

CLINICAL DATA: Pain

EXAM:
LEFT RIBS - 2 VIEW

[x chest ap (1 of 2)]
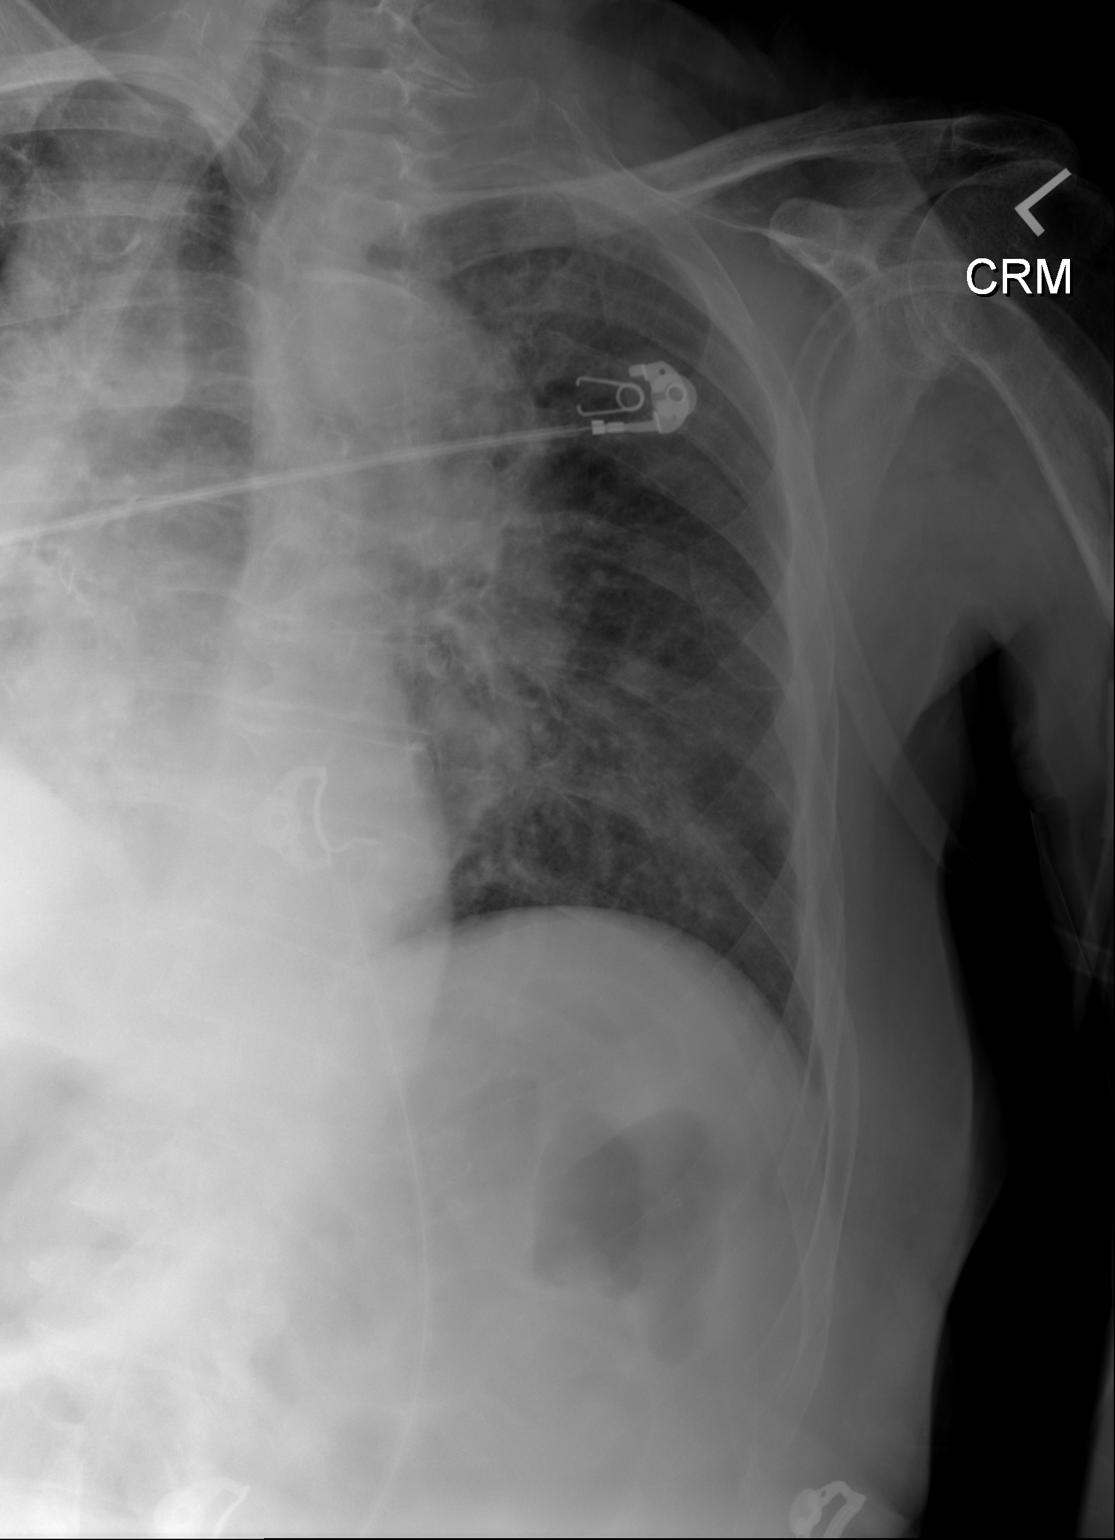

[x chest ap (2 of 2)]
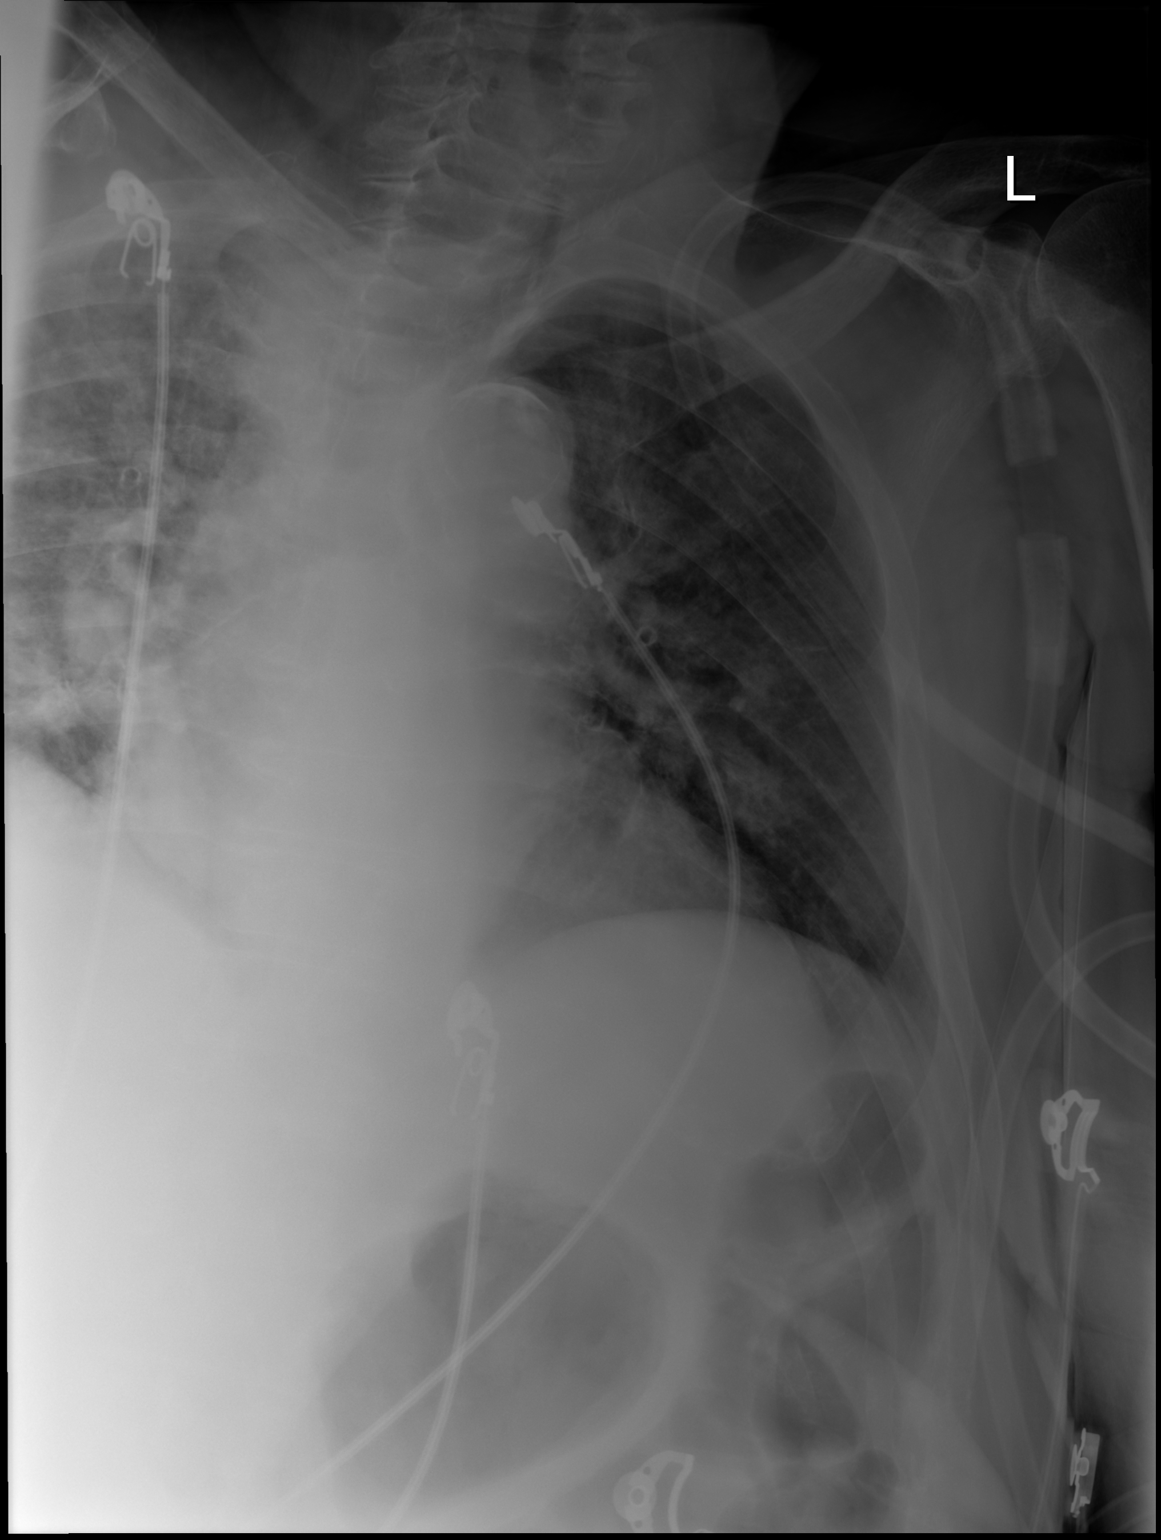

[2 of 2 positions shown; findings below may reference images not displayed]

FINDINGS: There is no evidence of acute displaced rib fracture. Low lung
volumes. Left shoulder degenerative changes.
IMPRESSION: No evidence of displaced left rib fracture.

## 2021-07-08 IMAGING — CT CT CERVICAL SPINE W/O CM
3 of 4 series · 12 of 35 positions shown, 14 images · non-contrast
Comparison: CT head [DATE]

CLINICAL DATA: Fall, neck trauma, knee fracture

EXAM:
CT HEAD WITHOUT CONTRAST
CT CERVICAL SPINE WITHOUT CONTRAST
TECHNIQUE: Multidetector CT imaging of the head and cervical spine was
performed following the standard protocol without intravenous
contrast. Multiplanar CT image reconstructions of the cervical spine
were also generated.

[Series 8: sag bone · sagittal · 0.25mm/px · 5 of 75 slices shown, 6 images]
[im 25/75  bone]
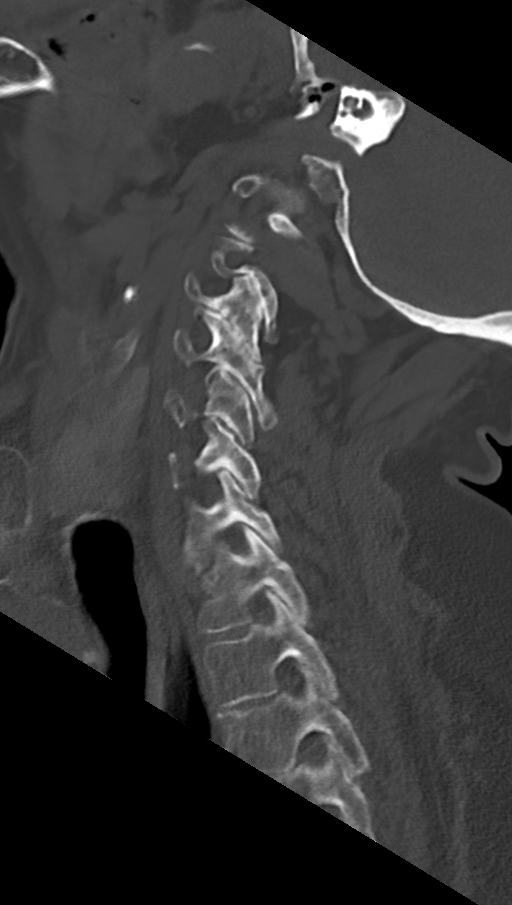
[im 31/75  bone]
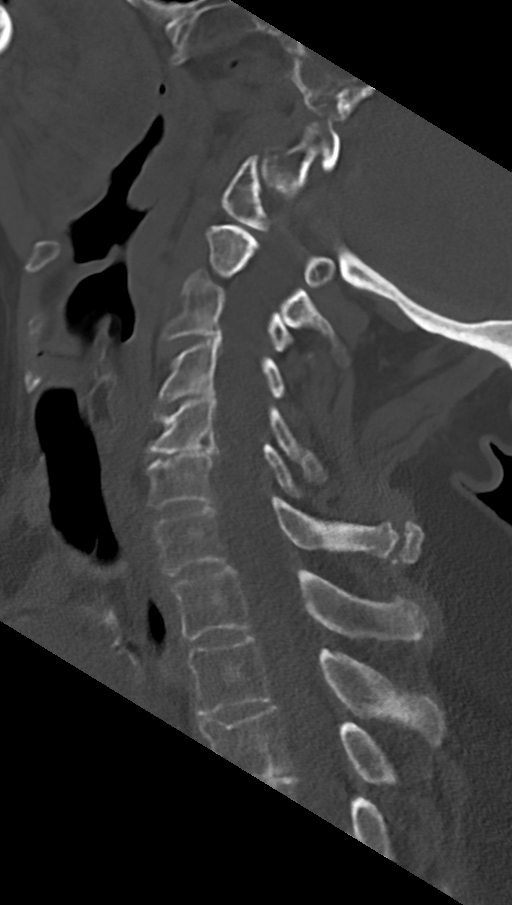
[im 38/75  soft-tissue]
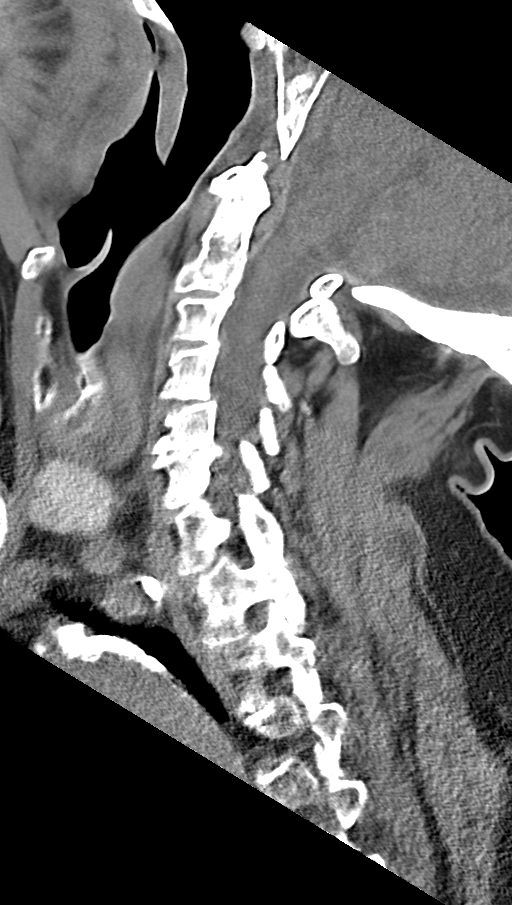
[im 38/75  bone]
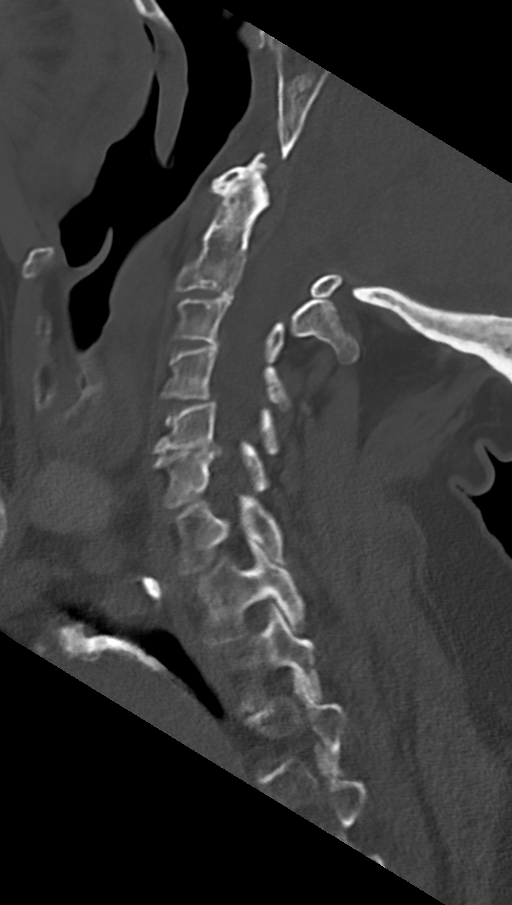
[im 44/75  bone]
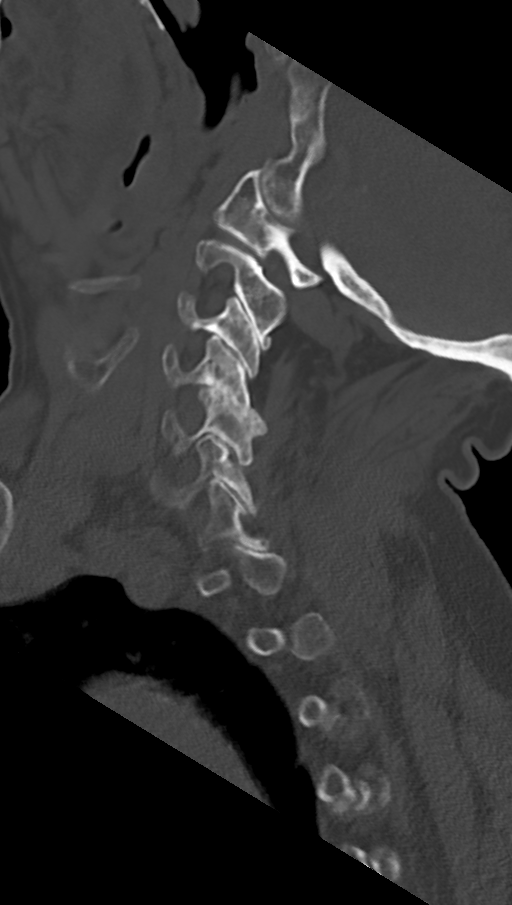
[im 50/75  bone]
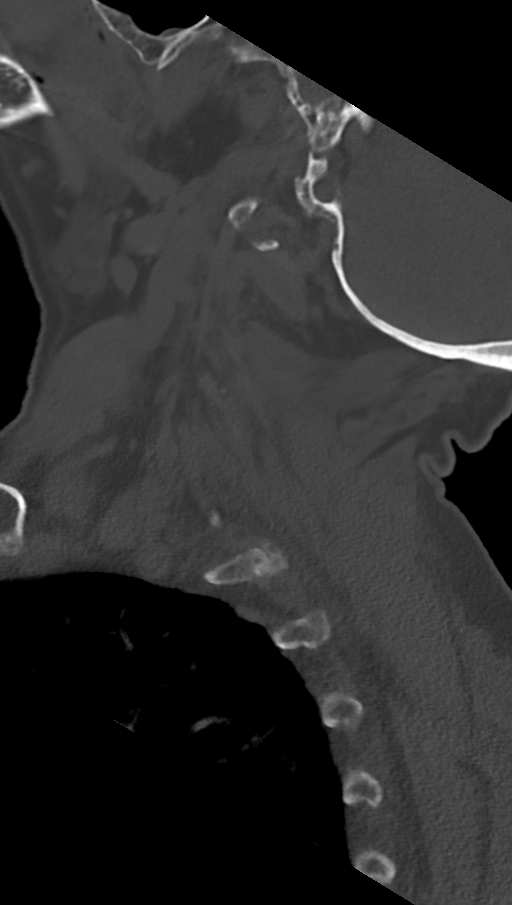

[Series 9: cor bone · coronal · 0.33mm/px · 3 of 72 slices shown]
[im 21/72  bone]
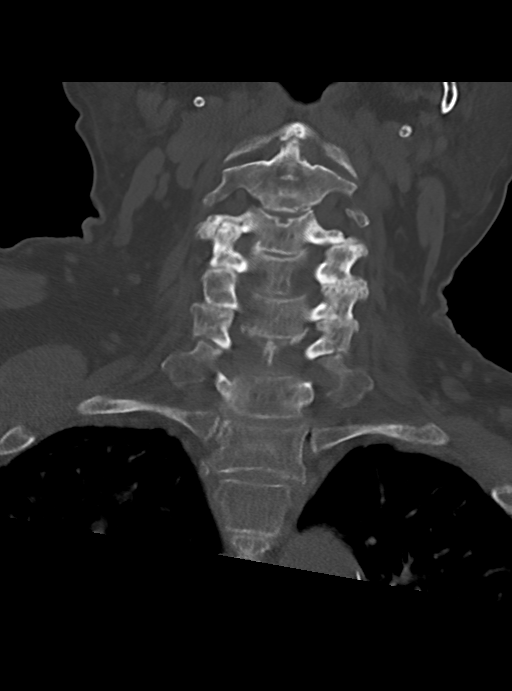
[im 31/72  bone]
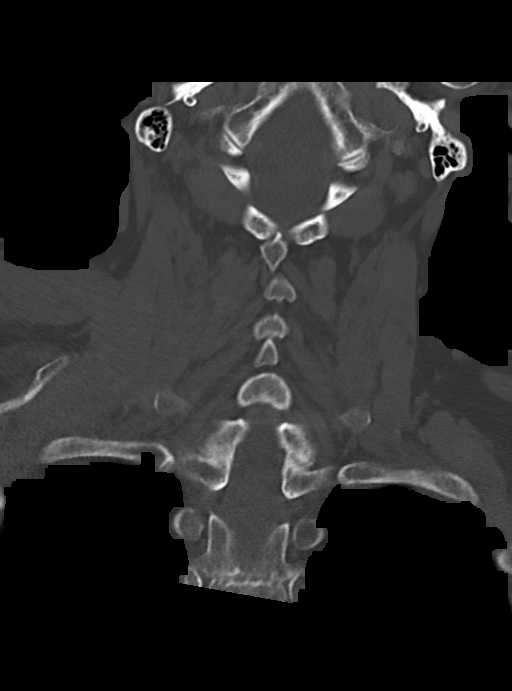
[im 41/72  bone]
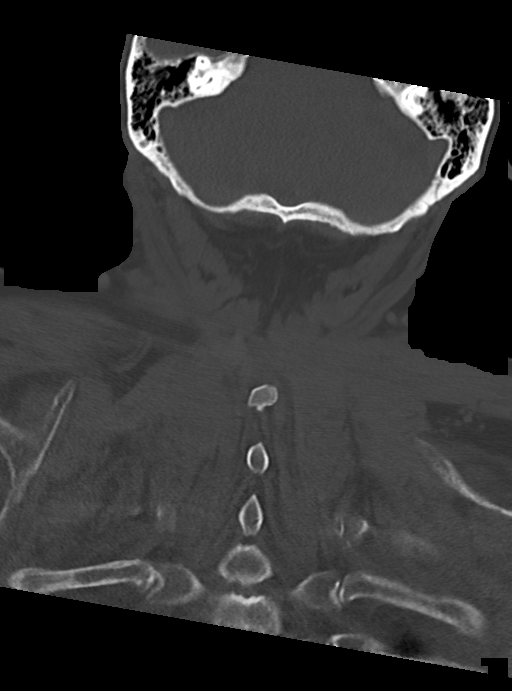

[Series 10: orthogonal axials · axial · 0.21mm/px · z∈[-269,-179]mm · 4 of 73 slices shown, 5 images]
[im 13/73  soft-tissue]
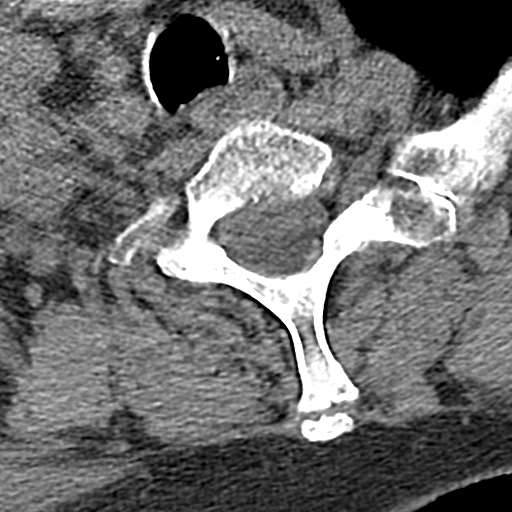
[im 13/73  bone]
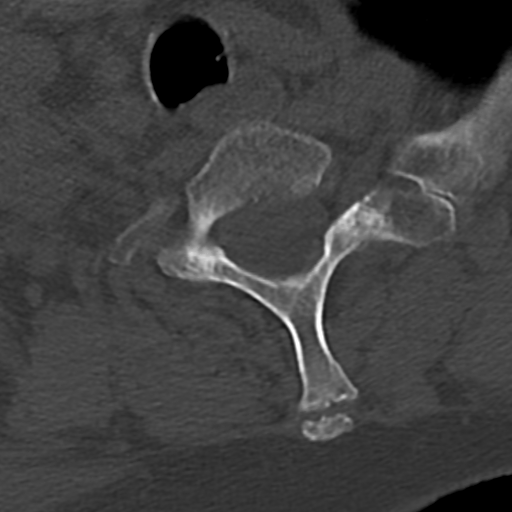
[im 25/73  bone]
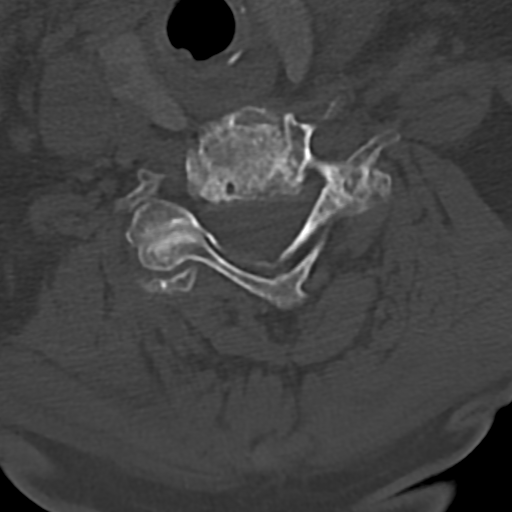
[im 49/73  bone]
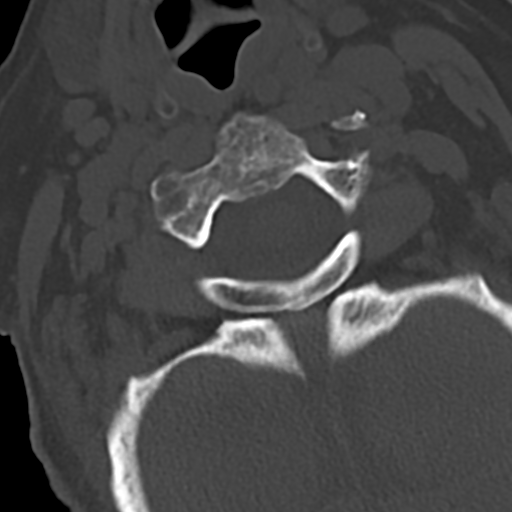
[im 61/73  bone]
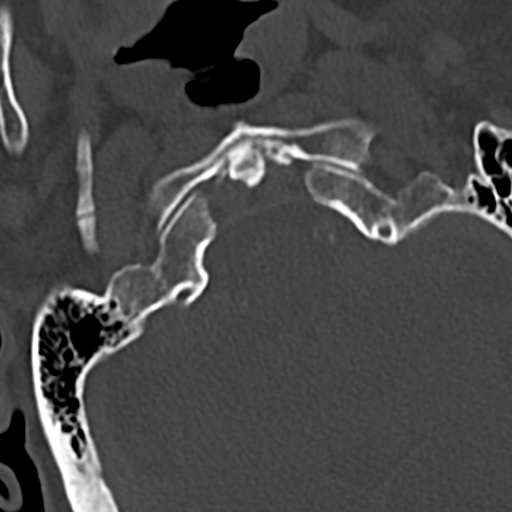

[12 of 35 positions shown; findings below may reference images not displayed]

FINDINGS: CT HEAD FINDINGS

Brain: Generalized atrophy. Normal ventricular morphology. No
midline shift or mass effect. Minor small vessel chronic ischemic
changes of deep cerebral white matter. No intracranial hemorrhage,
mass lesion or evidence of acute infarction. No extra-axial fluid
collections.

Vascular: Atherosclerotic calcification of internal carotid and
vertebral arteries at skull base

Skull: Intact

Sinuses/Orbits: Small mucosal retention cyst RIGHT sphenoid sinus.
Remaining visualized paranasal sinuses and mastoid air cells clear

Other: N/A

CT CERVICAL SPINE FINDINGS

Alignment: Normal

Skull base and vertebrae: Osseous demineralization. Skull base
intact. Scattered disc space narrowing with disc space narrowing and
endplate spur formation at C5-C6. Scattered narrowing of remaining
cervical disc spaces. Vertebral body heights maintained without
fracture, subluxation, or bone destruction.

Soft tissues and spinal canal: Prevertebral soft tissues normal
thickness. Atherosclerotic calcification aortic arch and proximal
great vessels.

Disc levels:  No specific abnormalities

Upper chest: Lung apices clear

Other: N/A
IMPRESSION: No acute intracranial abnormalities.

Degenerative disc and facet disease changes cervical spine.

No acute cervical spine abnormalities.

Aortic Atherosclerosis ([RQ]-[RQ]).

## 2021-07-08 IMAGING — CT CT KNEE*R* W/O CM
3 of 5 series · 13 of 36 positions shown, 15 images · non-contrast
Comparison: Same-day radiograph.

CLINICAL DATA: Fracture, knee

EXAM:
CT OF THE right KNEE WITHOUT CONTRAST
TECHNIQUE: Multidetector CT imaging of the right knee was performed according
to the standard protocol. Multiplanar CT image reconstructions were
also generated.

[Series 5: extremity soft tissue · axial · 0.46mm/px · z∈[-1071,-831]mm · 6 of 158 slices shown, 8 images]
[im 25/158  soft-tissue]
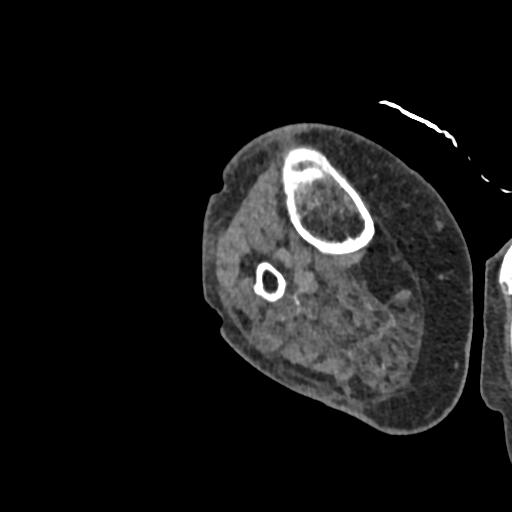
[im 25/158  bone]
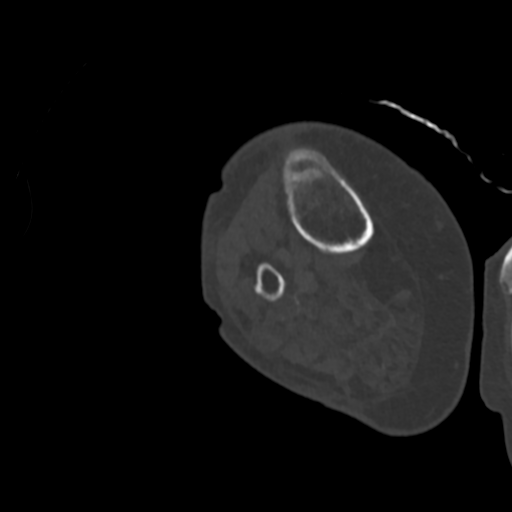
[im 49/158  bone]
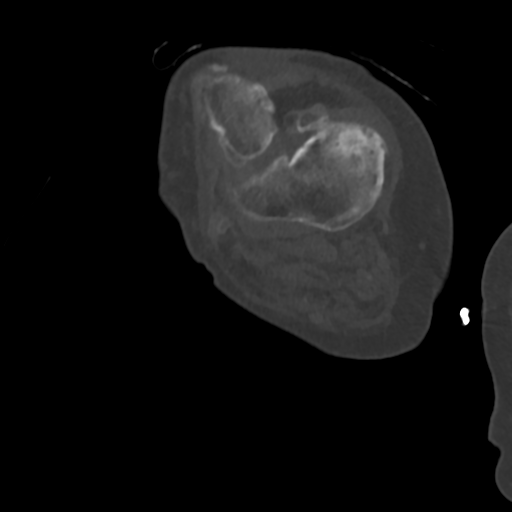
[im 73/158  bone]
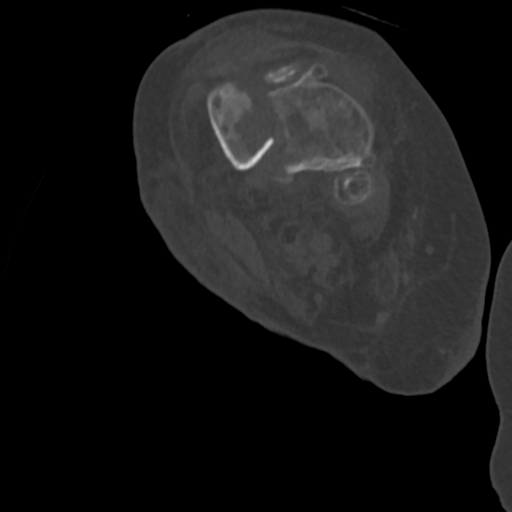
[im 97/158  bone]
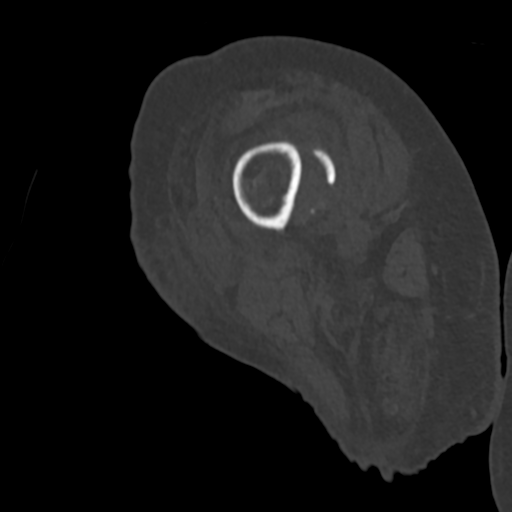
[im 121/158  soft-tissue]
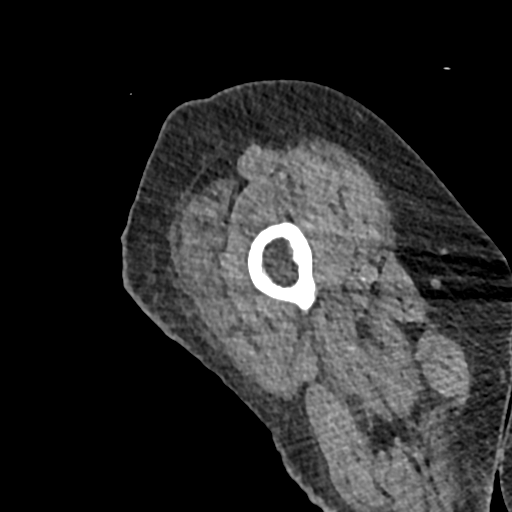
[im 121/158  bone]
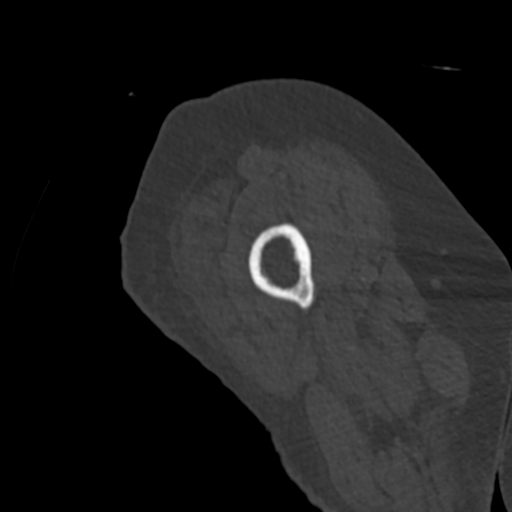
[im 145/158  bone]
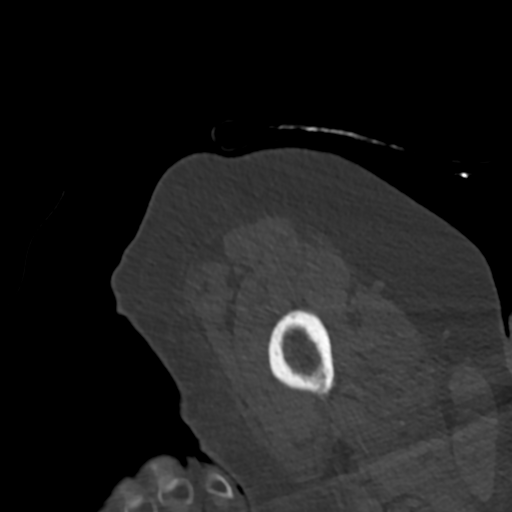

[Series 8: sag bone · sagittal · 0.41mm/px · 5 of 85 slices shown]
[im 11/85  soft-tissue]
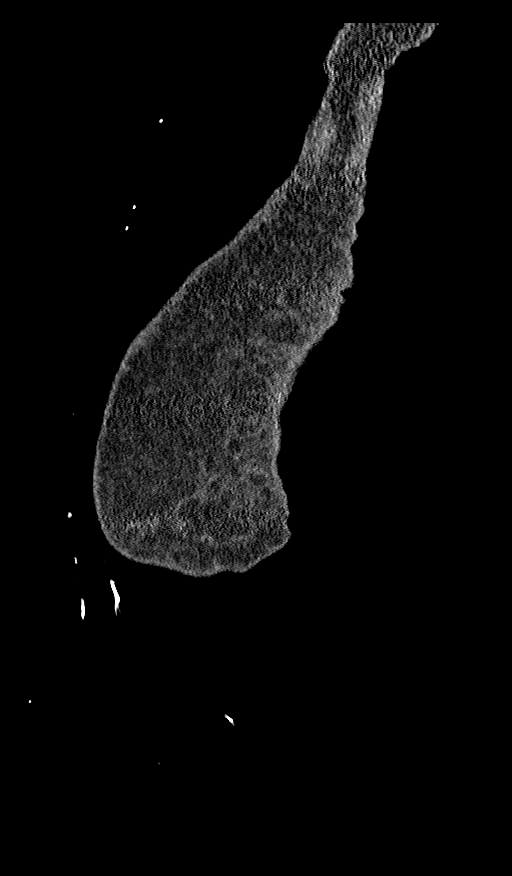
[im 17/85  bone]
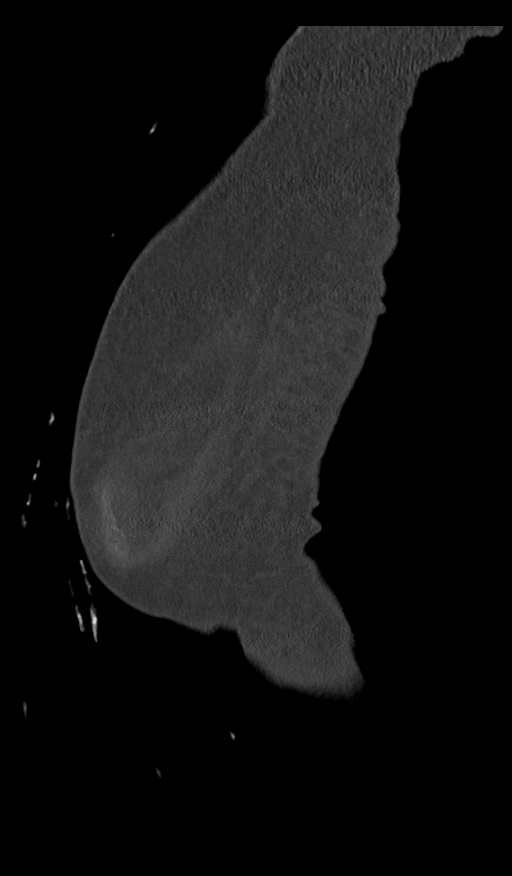
[im 34/85  bone]
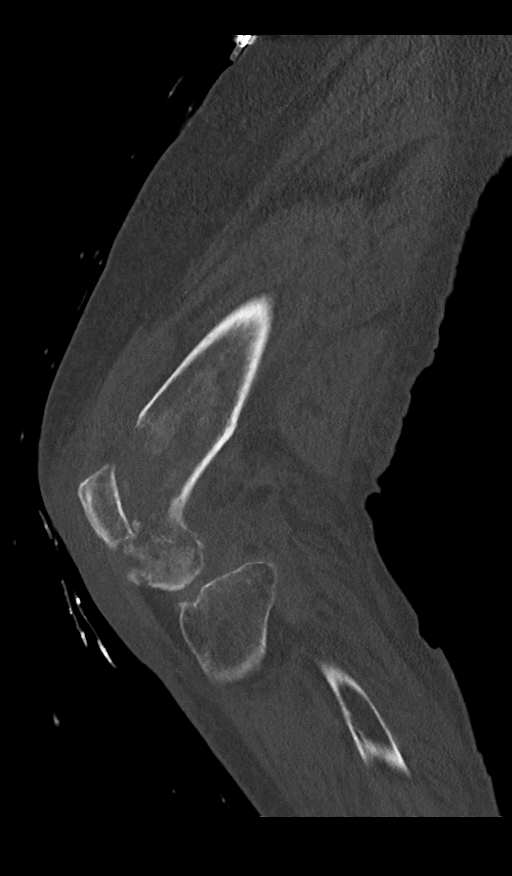
[im 51/85  bone]
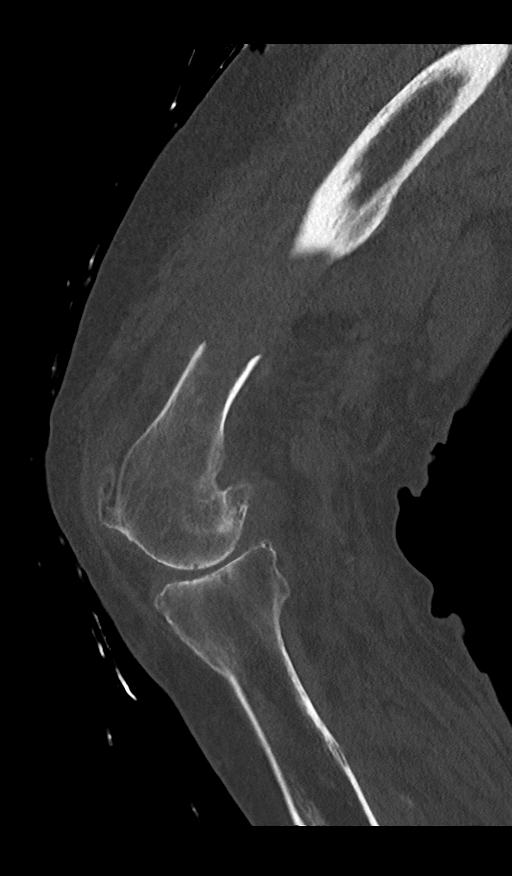
[im 68/85  bone]
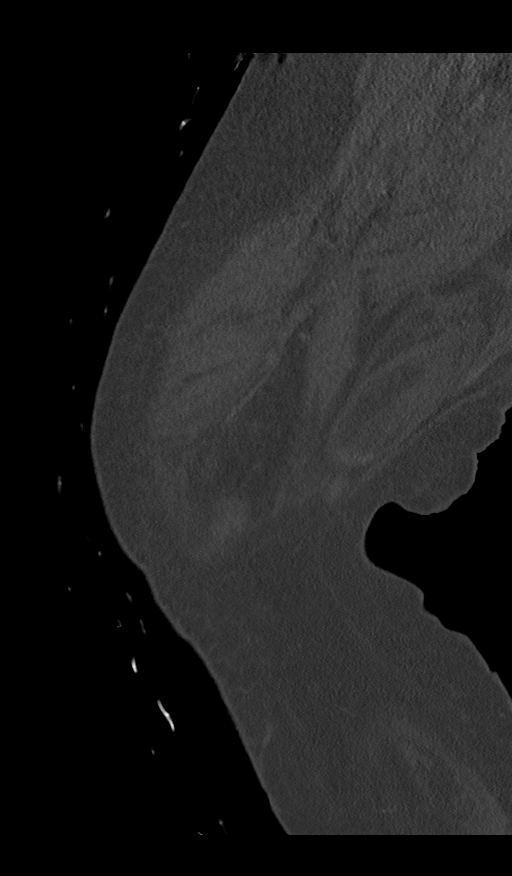

[Series 9: cor soft tissue · coronal · 0.47mm/px · 2 of 137 slices shown]
[im 46/137  bone]
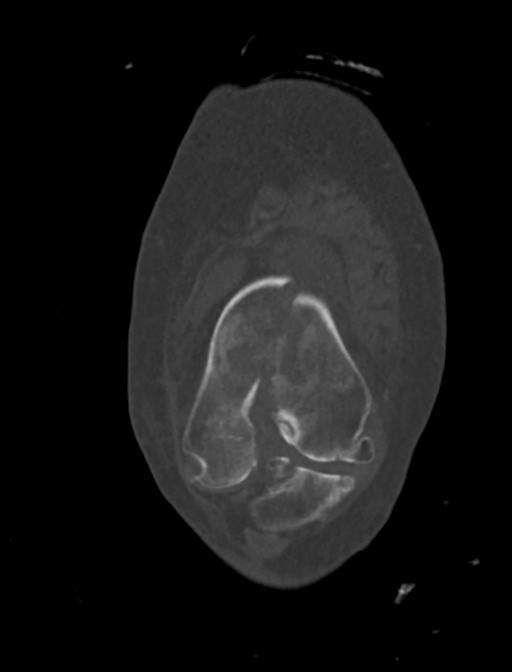
[im 91/137  bone]
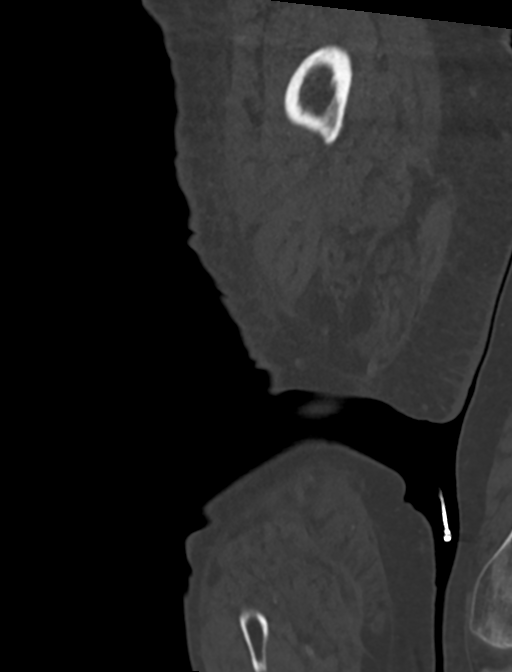

[13 of 36 positions shown; findings below may reference images not displayed]

FINDINGS: Bones/Joint/Cartilage

There is a an obliquely oriented distal femur fracture extending
from the medial metadiaphysis through the intercondylar notch into
the knee joint. There is a moderate-sized joint effusion. There is
moderate-severe tricompartment osteoarthritis with bulky osteophyte
formation of the knee. There is an ossified osteochondral joint body
anteriorly in the knee joint. There is 7 mm articular surface
depression of the anterior lateral tibial plateau (series 9, image
56). No evidence of patellar fracture.

Ligaments

Suboptimally assessed by CT.

Muscles and Tendons

Mild generalized atrophy of the hamstrings.

Soft tissues

Soft tissue swelling along the knee.
IMPRESSION: Obliquely oriented intra-articular distal femur fracture, extending
from the medial metadiaphysis through the intercondylar notch into
the knee joint. Moderate size joint effusion.

Articular surface depression of the anterior lateral tibial plateau
by 7 mm compatible with fracture.

Moderate-severe tricompartment osteoarthritis of the knee with bulky
osteophyte formation and a 1.2 cm osteochondral joint body
anteriorly.

## 2021-07-08 MED ORDER — FENTANYL CITRATE (PF) 100 MCG/2ML IJ SOLN
50.0000 ug | Freq: Once | INTRAMUSCULAR | Status: AC
  Administered 2021-07-08: 50 ug via INTRAVENOUS
  Filled 2021-07-08: qty 2

## 2021-07-08 MED ORDER — ONDANSETRON HCL 4 MG PO TABS: 4.0000 mg | ORAL_TABLET | Freq: Four times a day (QID) | ORAL | Status: DC | PRN

## 2021-07-08 MED ORDER — ATORVASTATIN CALCIUM 40 MG PO TABS
40.0000 mg | ORAL_TABLET | Freq: Every day | ORAL | Status: DC
  Administered 2021-07-08 – 2021-07-17 (×10): 40 mg via ORAL
  Filled 2021-07-08 (×10): qty 1

## 2021-07-08 MED ORDER — HYDROMORPHONE HCL 1 MG/ML IJ SOLN
1.0000 mg | INTRAMUSCULAR | Status: DC | PRN
  Administered 2021-07-10: 1 mg via INTRAVENOUS
  Filled 2021-07-08 (×2): qty 1

## 2021-07-08 MED ORDER — ACETAMINOPHEN 500 MG PO TABS
1000.0000 mg | ORAL_TABLET | Freq: Three times a day (TID) | ORAL | Status: DC
  Administered 2021-07-08 – 2021-07-11 (×8): 1000 mg via ORAL
  Filled 2021-07-08 (×9): qty 2

## 2021-07-08 MED ORDER — AMIODARONE HCL 200 MG PO TABS
200.0000 mg | ORAL_TABLET | Freq: Two times a day (BID) | ORAL | Status: DC
  Administered 2021-07-08 – 2021-07-17 (×19): 200 mg via ORAL
  Filled 2021-07-08 (×19): qty 1

## 2021-07-08 MED ORDER — SODIUM CHLORIDE 0.9 % IV BOLUS
500.0000 mL | Freq: Once | INTRAVENOUS | Status: AC
  Administered 2021-07-08: 500 mL via INTRAVENOUS

## 2021-07-08 MED ORDER — FLUOXETINE HCL 20 MG PO CAPS
40.0000 mg | ORAL_CAPSULE | Freq: Every day | ORAL | Status: DC
  Administered 2021-07-09 – 2021-07-17 (×9): 40 mg via ORAL
  Filled 2021-07-08 (×9): qty 2

## 2021-07-08 MED ORDER — HYDROCODONE-ACETAMINOPHEN 5-325 MG PO TABS
1.0000 | ORAL_TABLET | Freq: Four times a day (QID) | ORAL | Status: DC | PRN
  Administered 2021-07-09 – 2021-07-11 (×4): 1 via ORAL
  Filled 2021-07-08 (×5): qty 1

## 2021-07-08 MED ORDER — POLYETHYLENE GLYCOL 3350 17 G PO PACK
17.0000 g | PACK | Freq: Every day | ORAL | Status: DC | PRN
  Administered 2021-07-13: 17 g via ORAL
  Filled 2021-07-08: qty 1

## 2021-07-08 MED ORDER — ONDANSETRON HCL 4 MG/2ML IJ SOLN
4.0000 mg | Freq: Four times a day (QID) | INTRAMUSCULAR | Status: DC | PRN
  Filled 2021-07-08: qty 2

## 2021-07-08 NOTE — Consult Note (Signed)
Reason for Consult:Right distal femur fx Referring Physician: Mali Sheldon Time called: [id number] Time at bedside: [name] is an 75 y.o. female.  HPI: [name] [name] got home from the doctor's office on Friday. She was using her RW but still managed to fall and hurt her right knee. Despite the pain she was able to get up and bear weight and ambulate. The knee kept hurting more and more through the weekend. When she woke up Tuesday morning she could not get up and called EMS. Workup in the ED showed a distal femur fx and orthopedic surgery was consulted. She lives at home alone.  Past Medical History:  Diagnosis Date   Anemia    Arthritis    Cancer ([place]) 2012   colon   Diabetes mellitus without complication (HCC)    GERD (gastroesophageal reflux disease)    Headache    Hypertension    Sleep apnea    has cpap, does not use cpap on regular basis    Past Surgical History:  Procedure Laterality Date   ABDOMINAL HYSTERECTOMY     partial   BREAST BIOPSY Bilateral pt unsure   benign   COLON SURGERY  2012   no chemo or radiation done   COLONOSCOPY WITH PROPOFOL N/A 01/30/2015   Procedure: COLONOSCOPY WITH PROPOFOL;  Surgeon: Juanita Craver, MD;  Location: [facility];  Service: Endoscopy;  Laterality: N/A;   JOINT REPLACEMENT     left hip replacement x 2   left wrist surgery for fracture      right hand cortisone injection     cast put on    No family history on file.  Social History:  reports that she quit smoking about 56 years ago. Her smoking use included cigarettes. She has a 2.50 pack-year smoking history. She has never used smokeless tobacco. She reports that she does not drink alcohol and does not use drugs.  Allergies:  Allergies  Allergen Reactions   Ace Inhibitors Other (See Comments)    07/08/21 Pt states these meds do not bother her.   Codeine Other (See Comments)    headache   Morphine And Related Other (See Comments)    Hallucinations 07/08/21 headaches    Oxycodone-Acetaminophen Nausea And Vomiting    Medications: I have reviewed the patient's current medications.  Results for orders placed or performed during the hospital encounter of 07/08/21 (from the past 48 hour(s))  CBC with Differential     Status: Abnormal   Collection Time: 07/08/21 12:09 AM  Result Value Ref Range   WBC 7.9 4.0 - 10.5 K/uL   RBC 3.70 (L) 3.87 - 5.11 MIL/uL   Hemoglobin 11.3 (L) 12.0 - 15.0 g/dL   HCT 35.0 (L) 36.0 - 46.0 %   MCV 94.6 80.0 - 100.0 fL   MCH 30.5 26.0 - 34.0 pg   MCHC 32.3 30.0 - 36.0 g/dL   RDW 14.9 11.5 - 15.5 %   Platelets 302 150 - 400 K/uL   nRBC 0.0 0.0 - 0.2 %   Neutrophils Relative % 79 %   Neutro Abs 6.2 1.7 - 7.7 K/uL   Lymphocytes Relative 13 %   Lymphs Abs 1.0 0.7 - 4.0 K/uL   Monocytes Relative 7 %   Monocytes Absolute 0.6 0.1 - 1.0 K/uL   Eosinophils Relative 0 %   Eosinophils Absolute 0.0 0.0 - 0.5 K/uL   Basophils Relative 0 %   Basophils Absolute 0.0 0.0 - 0.1 K/uL   Immature  Granulocytes 1 %   Abs Immature Granulocytes 0.04 0.00 - 0.07 K/uL    Comment: Performed at [facility], [place] [address], [place], [place] [address]  Comprehensive metabolic panel     Status: Abnormal   Collection Time: 07/08/21 12:09 AM  Result Value Ref Range   Sodium 135 135 - 145 mmol/L   Potassium 4.1 3.5 - 5.1 mmol/L   Chloride 101 98 - 111 mmol/L   CO2 22 22 - 32 mmol/L   Glucose, Bld 126 (H) 70 - 99 mg/dL    Comment: Glucose reference range applies only to samples taken after fasting for at least 8 hours.   BUN 12 8 - 23 mg/dL   Creatinine, Ser 0.66 0.44 - 1.00 mg/dL   Calcium 9.0 8.9 - 10.3 mg/dL   Total Protein 6.3 (L) 6.5 - 8.1 g/dL   Albumin 3.4 (L) 3.5 - 5.0 g/dL   AST 20 15 - 41 U/L   ALT 18 0 - 44 U/L   Alkaline Phosphatase 99 38 - 126 U/L   Total Bilirubin 0.5 0.3 - 1.2 mg/dL   GFR, Estimated >60 >60 mL/min    Comment: (NOTE) Calculated using the CKD-EPI Creatinine Equation (2021)    Anion gap 12 5 - 15     Comment: Performed at [facility], [place] [address] [place], [place] [address]  Troponin I (High Sensitivity)     Status: None   Collection Time: 07/08/21  8:11 AM  Result Value Ref Range   Troponin I (High Sensitivity) 7 <18 ng/L    Comment: (NOTE) Elevated high sensitivity troponin I (hsTnI) values and significant  changes across serial measurements may suggest ACS but many other  chronic and acute conditions are known to elevate hsTnI results.  Refer to the "Links" section for chest pain algorithms and additional  guidance. Performed at [facility], [place] [address], [place], [place] [address]   Resp Panel by RT-PCR (Flu A&B, Covid) Nasopharyngeal Swab     Status: None   Collection Time: 07/08/21  8:19 AM   Specimen: Nasopharyngeal Swab; Nasopharyngeal(NP) swabs in vial transport medium  Result Value Ref Range   SARS Coronavirus 2 by RT PCR NEGATIVE NEGATIVE    Comment: (NOTE) SARS-CoV-2 target nucleic acids are NOT DETECTED.  The SARS-CoV-2 RNA is generally detectable in upper respiratory specimens during the acute phase of infection. The lowest concentration of SARS-CoV-2 viral copies this assay can detect is 138 copies/mL. A negative result does not preclude SARS-Cov-2 infection and should not be used as the sole basis for treatment or other patient management decisions. A negative result may occur with  improper specimen collection/handling, submission of specimen other than nasopharyngeal swab, presence of viral mutation(s) within the areas targeted by this assay, and inadequate number of viral copies(<138 copies/mL). A negative result must be combined with clinical observations, patient history, and epidemiological information. The expected result is Negative.  Fact Sheet for Patients:  EntrepreneurPulse.com.au  Fact Sheet for Healthcare Providers:  IncredibleEmployment.be  This test is no t yet approved or cleared by the  [place] FDA and  has been authorized for detection and/or diagnosis of SARS-CoV-2 by FDA under an Emergency Use Authorization (EUA). This EUA will remain  in effect (meaning this test can be used) for the duration of the COVID-19 declaration under Section 564(b)(1) of the Act, 21 U.S.C.section 360bbb-3(b)(1), unless the authorization is terminated  or revoked sooner.       Influenza  A by PCR NEGATIVE NEGATIVE   Influenza B by PCR NEGATIVE NEGATIVE    Comment: (NOTE) The Xpert Xpress SARS-CoV-2/FLU/RSV plus assay is intended as an aid in the diagnosis of influenza from Nasopharyngeal swab specimens and should not be used as a sole basis for treatment. Nasal washings and aspirates are unacceptable for Xpert Xpress SARS-CoV-2/FLU/RSV testing.  Fact Sheet for Patients: EntrepreneurPulse.com.au  Fact Sheet for Healthcare Providers: IncredibleEmployment.be  This test is not yet approved or cleared by the [place] FDA and has been authorized for detection and/or diagnosis of SARS-CoV-2 by FDA under an Emergency Use Authorization (EUA). This EUA will remain in effect (meaning this test can be used) for the duration of the COVID-19 declaration under Section 564(b)(1) of the Act, 21 U.S.C. section 360bbb-3(b)(1), unless the authorization is terminated or revoked.  Performed at [facility], [place] [address] [place], [place] [address]   Troponin I (High Sensitivity)     Status: None   Collection Time: 07/08/21 10:32 AM  Result Value Ref Range   Troponin I (High Sensitivity) 7 <18 ng/L    Comment: (NOTE) Elevated high sensitivity troponin I (hsTnI) values and significant  changes across serial measurements may suggest ACS but many other  chronic and acute conditions are known to elevate hsTnI results.  Refer to the "Links" section for chest pain algorithms and additional  guidance. Performed at [facility], [place] [address], [place], [place] [address]     CT HEAD WO CONTRAST (5MM)  Result Date: 07/08/2021 CLINICAL DATA:  Fall, neck trauma, knee fracture EXAM: CT HEAD WITHOUT CONTRAST CT CERVICAL SPINE WITHOUT CONTRAST TECHNIQUE: Multidetector CT imaging of the head and cervical spine was performed following the standard protocol without intravenous contrast. Multiplanar CT image reconstructions of the cervical spine were also generated. COMPARISON:  CT head 02/04/2021 FINDINGS: CT HEAD FINDINGS Brain: Generalized atrophy. Normal ventricular morphology. No midline shift or mass effect. Minor small vessel chronic ischemic changes of deep cerebral white matter. No intracranial hemorrhage, mass lesion or evidence of acute infarction. No extra-axial fluid collections. Vascular: Atherosclerotic calcification of internal carotid and vertebral arteries at skull base Skull: Intact Sinuses/Orbits: Small mucosal retention cyst RIGHT sphenoid sinus. Remaining visualized paranasal sinuses and mastoid air cells clear Other: N/A CT CERVICAL SPINE FINDINGS Alignment: Normal Skull base and vertebrae: Osseous demineralization. Skull base intact. Scattered disc space narrowing with disc space narrowing and endplate spur formation at C5-C6. Scattered narrowing of remaining cervical disc spaces. Vertebral body heights maintained without fracture, subluxation, or bone destruction. Soft tissues and spinal canal: Prevertebral soft tissues normal thickness. Atherosclerotic calcification aortic arch and proximal great vessels. Disc levels:  No specific abnormalities Upper chest: Lung apices clear Other: N/A IMPRESSION: No acute intracranial abnormalities. 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 No acute cervical spine abnormalities. Aortic Atherosclerosis (ICD10-I70.0). Electronically Signed   By: Lavonia Dana M.D.   On: 07/08/2021 08:14   CT Cervical Spine Wo Contrast  Result Date: 07/08/2021 CLINICAL DATA:  Fall, neck trauma, knee  fracture EXAM: CT HEAD WITHOUT CONTRAST CT CERVICAL SPINE WITHOUT CONTRAST TECHNIQUE: Multidetector CT imaging of the head and cervical spine was performed following the standard protocol without intravenous contrast. Multiplanar CT image reconstructions of the cervical spine were also generated. COMPARISON:  CT head 02/04/2021 FINDINGS: CT HEAD FINDINGS Brain: Generalized atrophy. Normal ventricular morphology. No midline shift or mass effect. Minor small vessel chronic ischemic changes of deep cerebral white matter. No intracranial hemorrhage, mass  lesion or evidence of acute infarction. No extra-axial fluid collections. Vascular: Atherosclerotic calcification of internal carotid and vertebral arteries at skull base Skull: Intact Sinuses/Orbits: Small mucosal retention cyst RIGHT sphenoid sinus. Remaining visualized paranasal sinuses and mastoid air cells clear Other: N/A CT CERVICAL SPINE FINDINGS Alignment: Normal Skull base and vertebrae: Osseous demineralization. Skull base intact. Scattered disc space narrowing with disc space narrowing and endplate spur formation at C5-C6. Scattered narrowing of remaining cervical disc spaces. Vertebral body heights maintained without fracture, subluxation, or bone destruction. Soft tissues and spinal canal: Prevertebral soft tissues normal thickness. Atherosclerotic calcification aortic arch and proximal great vessels. Disc levels:  No specific abnormalities Upper chest: Lung apices clear Other: N/A IMPRESSION: No acute intracranial abnormalities. 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 No acute cervical spine abnormalities. Aortic Atherosclerosis (ICD10-I70.0). Electronically Signed   By: Lavonia Dana M.D.   On: 07/08/2021 08:14   CT Knee Right Wo Contrast  Result Date: 07/08/2021 CLINICAL DATA:  Fracture, knee EXAM: CT OF THE right KNEE WITHOUT CONTRAST TECHNIQUE: Multidetector CT imaging of the right knee was performed according to the standard  protocol. Multiplanar CT image reconstructions were also generated. COMPARISON:  Same-day radiograph. FINDINGS: Bones/Joint/Cartilage There is a an obliquely oriented distal femur fracture extending from the medial metadiaphysis through the intercondylar notch into the knee joint. There is a moderate-sized joint effusion. There is moderate-severe tricompartment osteoarthritis with bulky osteophyte formation of the knee. There is an ossified osteochondral joint body anteriorly in the knee joint. There is 7 mm articular surface depression of the anterior lateral tibial plateau (series 9, image 56). No evidence of patellar fracture. Ligaments Suboptimally assessed by CT. Muscles and Tendons Mild generalized atrophy of the hamstrings. Soft tissues Soft tissue swelling along the knee. IMPRESSION: Obliquely oriented intra-articular distal femur fracture, extending from the medial metadiaphysis through the intercondylar notch into the knee joint. Moderate size joint effusion. Articular surface depression of the anterior lateral tibial plateau by 7 mm compatible with fracture. Moderate-severe tricompartment osteoarthritis of the knee with bulky osteophyte formation and a 1.2 cm osteochondral joint body anteriorly. Electronically Signed   By: Maurine Simmering   On: 07/08/2021 07:47   DG Chest Port 1 View  Result Date: 07/08/2021 CLINICAL DATA:  Chest pain, sternum tender to palpation EXAM: PORTABLE CHEST 1 VIEW COMPARISON:  Chest radiograph 02/06/2021 FINDINGS: The patient is rotated to the right. The heart size is stable to prior studies allowing for this rotation. The mediastinum appears widened, exaggerated by low lung volumes and AP technique, similar in appearance to the prior study dated 02/04/2021. Calcified atherosclerotic plaque is seen at the aortic arch. Lung volumes are low. There is no focal consolidation or pulmonary edema. There is no pleural effusion or pneumothorax. No acute osseous abnormality is identified.  IMPRESSION: 1. Low lung volumes. Otherwise, no radiographic evidence of acute cardiopulmonary process. 2. No fracture identified. Electronically Signed   By: Valetta Mole MD   On: 07/08/2021 08:18   DG Knee Complete 4 Views Right  Result Date: 07/08/2021 CLINICAL DATA:  Recent fall with right knee pain, initial encounter EXAM: RIGHT KNEE - COMPLETE 4+ VIEW COMPARISON:  None. FINDINGS: There is a an oblique fracture through the distal right femoral metaphysis and extending into the intercondylar notch separating the lateral and medial condyles. Tricompartmental degenerative changes are seen. Small joint effusion is noted. IMPRESSION: Oblique fracture through the distal femur extending into the intercondylar notch. Small joint effusion is seen. Tricompartmental degenerative changes are  noted. Electronically Signed   By: Inez Catalina M.D.   On: 07/08/2021 00:44    Review of Systems  HENT:  Negative for ear discharge, ear pain, hearing loss and tinnitus.   Eyes:  Negative for photophobia and pain.  Respiratory:  Negative for cough and shortness of breath.   Cardiovascular:  Negative for chest pain.  Gastrointestinal:  Negative for abdominal pain, nausea and vomiting.  Genitourinary:  Negative for dysuria, flank pain, frequency and urgency.  Musculoskeletal:  Positive for arthralgias (Right knee). Negative for back pain, myalgias and neck pain.  Neurological:  Negative for dizziness and headaches.  Hematological:  Does not bruise/bleed easily.  Psychiatric/Behavioral:  The patient is not nervous/anxious.   Blood pressure 114/77, pulse (!) 104, temperature 98.1 F (36.7 C), temperature source Oral, resp. rate (!) 28, height 5' (1.524 m), weight 85.3 kg, SpO2 94 %. Physical Exam Constitutional:      General: She is not in acute distress.    Appearance: She is well-developed. She is not diaphoretic.  HENT:     Head: Normocephalic and atraumatic.  Eyes:     General: No scleral icterus.       Right  eye: No discharge.        Left eye: No discharge.     Conjunctiva/sclera: Conjunctivae normal.  Cardiovascular:     Rate and Rhythm: Normal rate and regular rhythm.  Pulmonary:     Effort: Pulmonary effort is normal. No respiratory distress.  Musculoskeletal:     Cervical back: Normal range of motion.     Comments: RLE No traumatic wounds, ecchymosis, or rash  Severe TTP knee, leg shortened  No ankle effusion  Sens DPN, SPN, TN intact  Motor EHL, ext, flex, evers 5/5  DP 2+, PT 0, No significant edema  Skin:    General: Skin is warm and dry.  Neurological:     Mental Status: She is alert.  Psychiatric:        Mood and Affect: Mood normal.        Behavior: Behavior normal.    Assessment/Plan: Right distal femur fx -- Will need ORIF by Dr. Marcelino Scot, hopefully tomorrow. Multiple medical problems including obesity, GERD, diabetes, colon cancer, hypertension, sleep apnea, and A. fib on Eliquis -- per primary service. Please hold Eliquis.    Lisette Abu, PA-C Orthopedic Surgery [phone] 07/08/2021, 11:46 AM

## 2021-07-08 NOTE — ED Notes (Addendum)
Pt reports a fall on Friday when she was standing up from using her bedside commode. Pt reports her legs slipped out from under her and she landed on her bottom. Pt reports she lives at home by herself. She called her sister Friday. Pt reports she couldn't get out of her bed last night prompting her to call EMS. Pt reports it felt like someone was stabbing her in the knee. Pt also reports pain under L breast where she says she has a hernia that started after moving in the bed. Area is hard to touch and feels like bone.

## 2021-07-08 NOTE — Progress Notes (Signed)
Patient arrived to unit from ED, accompanied by staff. Transferred to bed with assist of staff x3. Patient alert and oriented x4. Bowel sounds present x4. Respirations are unlabored and symmetrical. VSS. No complaints of pain or discomfort at this time. Skin assessed upon arrival with charge nurse. Splint noted to R leg r/t femur fracture.

## 2021-07-08 NOTE — TOC Progression Note (Signed)
Transition of Care [facility]) - Progression Note    Patient Details  Name: [name] [name] MRN: [id number] Date of Birth: 02-26-1946  [facility] CM/SW Contact  Milinda Antis, [place] Phone Number: 07/08/2021, 4:30 PM  Clinical Narrative:     CSW following patient for any d/c planning needs once medically stable.  Pending: PT/OT recommendations after surgery.   Lind Covert, MSW, LCSWA          Expected Discharge Plan and Services                                                 Social Determinants of Health (SDOH) Interventions    Readmission Risk Interventions No flowsheet data found.

## 2021-07-08 NOTE — Plan of Care (Signed)

## 2021-07-08 NOTE — ED Notes (Signed)
Transport took pt upstairs via Biomedical scientist. VSS. GCS 15. Pain 0/10 unless moving.

## 2021-07-08 NOTE — Progress Notes (Signed)
Orthopedic Tech Progress Note Patient Details:  [name] [name] Apr 02, 1946 [id number] Applied knee splint to best of ability due to patient being in incorrect positioning so long. Ortho Devices Type of Ortho Device: Knee Immobilizer Ortho Device/Splint Location: RLE Ortho Device/Splint Interventions: Ordered, Application, Adjustment   Post Interventions Patient Tolerated: Well  [name] [name] 07/08/2021, 1:58 PM

## 2021-07-08 NOTE — H&P (Addendum)
Date: 07/08/2021               Patient Name:  [name] [name] MRN: [id number]  DOB: 20-Feb-1946 Age / Sex: 75 y.o., female   PCP: Chesley Noon, MD         Medical Service: Internal Medicine Teaching Service         Attending Physician: Dr. Sid Falcon, MD    First Contact: Dr. Jeanice Lim Pager: [phone]  Second Contact: Dr. Allyson Sabal Pager: [phone]       After Hours (After 5p/  First Contact Pager: [phone]  weekends / holidays): Second Contact Pager: [phone]   Chief Complaint: Knee pain   History of Present Illness:   [name] is a 75 y/o female with a PMHx of T2DM, HTN, A. Fib who presents to the ED with c/o knee pain.   [name] states that she was in her usual state of health when she went to use the bedside commode on Friday, August 5th.  Prior to sitting down, she tried to brace herself on the handles however there were towels on the handles.  The towel slipped and she fell down landing onto her right knee.  She had immediate sharp pain and swelling develop in that knee that has made it difficult for her to bear weight.  She has been attempting to ambulate around her home using a walker with significant difficulty.  Prior to fall, she denies any chest pain, dizziness, palpitations, shortness of breath, nausea, vomiting.  [name] also endorses left chest pain overlying her lower ribs that has developed over the last day and believes may be from when she was transferred to the EMS stretcher.  She notes it feels like prior rib fractures.  Due to the pain, she does have some difficulty taking deep breaths.  Patient states that she has been having increasing falls over the last 8 months.  She did go to a rehab facility, [facility], however disliked it on arrival and left immediately.  She feels that her falls are due to generalized weakness and deconditioning.  She finds it difficult to bathe herself, dress herself, cook her own meals and requires her sisters  help quite frequently.  ED Course:  On arrival to the ED, patient was hypertensive at 156/89 with heart rate of 103.  She was saturating at 95% on room air with respiratory rate of 18.  Patient was afebrile at 98.1 Fahrenheit.  Initial lab work was unremarkable compared to prior in May 2022.  Chest x-ray was obtained and negative.  CT head, C-spine were obtained and negative.  Knee x-ray was obtained and showed an oblique fracture through the distal femur.  Due to this, CT of the knee was obtained that showed obliquely oriented intra-articular distal femur extending into the intercondylar notch of the knee joint.  Moderate-sized joint effusion was noted.  Orthopedic surgery was consulted.  IMTS was consulted for admission.  Meds:  No current facility-administered medications on file prior to encounter.   Current Outpatient Medications on File Prior to Encounter  Medication Sig Dispense Refill   acetaminophen (TYLENOL) 650 MG CR tablet Take 1,300 mg by mouth every 8 (eight) hours as needed for pain.     ALPRAZolam (XANAX) 0.25 MG tablet Take 1 tablet (0.25 mg total) by mouth 2 (two) times daily as needed for anxiety. 6 tablet 0   amiodarone (PACERONE) 200 MG tablet Take 200 mg by mouth 2 (two) times daily.  apixaban (ELIQUIS) 5 MG TABS tablet Take 1 tablet (5 mg total) by mouth 2 (two) times daily. 60 tablet 0   famotidine (PEPCID) 40 MG tablet Take 40 mg by mouth daily as needed for indigestion.  3   HYDROcodone-acetaminophen (NORCO/VICODIN) 5-325 MG tablet Take 1 tablet by mouth every 6 (six) hours as needed for moderate pain. 6 tablet 0   lidocaine (LIDODERM) 5 % Place 1 patch onto the skin daily. Remove & Discard patch within 12 hours or as directed by MD 30 patch 0   Magnesium Oxide 400 MG CAPS Take 1 capsule (400 mg total) by mouth daily. 30 capsule 0   Multiple Vitamins-Minerals (ONE-A-DAY WOMENS 50+) TABS Take 1 tablet by mouth daily.     polyethylene glycol powder (GLYCOLAX/MIRALAX) 17  GM/SCOOP powder Take 17 g by mouth daily.     potassium chloride (KLOR-CON) 10 MEQ tablet Take 10 mEq by mouth 2 (two) times daily.     simvastatin (ZOCOR) 80 MG tablet Take 80 mg by mouth at bedtime.  3   diclofenac Sodium (VOLTAREN) 1 % GEL Apply 4 g topically 4 (four) times daily. (Patient not taking: Reported on 07/08/2021)     Allergies: Allergies as of 07/07/2021 - Review Complete 04/09/2021  Allergen Reaction Noted   Ace inhibitors  04/09/2021   Morphine and related  04/09/2021   Codeine Other (See Comments)    Oxycodone-acetaminophen Nausea And Vomiting 12/18/2010   Past Medical History:  Diagnosis Date   Anemia    Arthritis    Cancer ([place]) 2012   colon   Diabetes mellitus without complication (HCC)    GERD (gastroesophageal reflux disease)    Headache    Hypertension    Sleep apnea    has cpap, does not use cpap on regular basis   Family History:  Diabetes - Mother  COPD - Father  Social History:  - Lives in a trailer home alone, however her sister Constance Holster helps with ADLs including bathing. Grocery shopping done by Constance Holster as well - [name] denies any current alcohol, tobacco or drug use.   Review of Systems: A complete ROS was negative except as per HPI.   Physical Exam: Blood pressure 134/66, pulse 98, temperature 98.1 F (36.7 C), temperature source Oral, resp. rate (!) 25, height 5' (1.524 m), weight 85.3 kg, SpO2 93 %.  Physical Exam Vitals and nursing note reviewed.  Constitutional:      General: She is not in acute distress.    Appearance: She is obese.  HENT:     Head: Normocephalic and atraumatic.     Mouth/Throat:     Mouth: Mucous membranes are moist.     Pharynx: Oropharynx is clear.  Eyes:     Extraocular Movements: Extraocular movements intact.     Pupils: Pupils are equal, round, and reactive to light.  Cardiovascular:     Rate and Rhythm: Tachycardia present. Rhythm irregularly irregular.     Heart sounds: Murmur heard.  Systolic murmur  is present with a grade of 3/6.  Pulmonary:     Effort: Pulmonary effort is normal. No respiratory distress.     Breath sounds: No wheezing, rhonchi or rales.  Abdominal:     General: Bowel sounds are normal. There is no distension.     Palpations: Abdomen is soft.     Tenderness: There is no abdominal tenderness. There is no guarding.  Musculoskeletal:     Right knee: Swelling and effusion (Moderate-large effusion noted.) present. No  erythema, ecchymosis or lacerations. Decreased range of motion (Secondary to pain). Tenderness (Along the anterior aspect of the patella extending into medial and lateral joint line) present.     Right lower leg: No edema.     Left lower leg: No edema.  Skin:    General: Skin is warm and dry.  Neurological:     Mental Status: She is alert and oriented to person, place, and time.  Psychiatric:        Mood and Affect: Mood normal.        Behavior: Behavior normal.        Thought Content: Thought content normal.        Judgment: Judgment normal.   EKG: personally reviewed my interpretation is atrial fibrillation with rate of 106. No axis deviation. No ST or T wave changes concerning for acute ischemia.   CXR: personally reviewed my interpretation is: poor patient positioning, otherwise no infiltrates or effusions noted.   Assessment & Plan by Problem: Active Problems:   Femur fracture, right [facility])  [name] is a 75 y/o female with a PMHx of T2DM, HTN, A. Fib who is currently admitted for a distal femur fracture secondary to mechanical fall.   # Distal Femur Fracture, right # Osteoporosis  In the setting of mechanical fall. No suspicion for syncope or LOC at this time. Orthopedic surgery has been consulted and planning for ORIF on August 12.  Given recurrent falls in the last 8 months with now femur fracture requiring surgical repair, I suspect patient will need SNF on discharge.    Per chart review, patient had a DEXA scan in 2017 that was  consistent with osteoporosis.  It does not appear that she has tried biphosphonate therapy in the past.  This may be something to consider in the future to reduce risk of additional fractures.  Will obtain vitamin D level.  Will hold off on calcium supplementation as calcium level are within normal limits.  - Orthopedic surgery following; appreciate their recommendations - Surgery planned for August 12 - N.p.o. at midnight on 8/11 - Holding Eliquis - Tylenol, Norco, and Dilaudid for pain control - TOC consult for SNF placement - Vitamin D level ordered  # Rib Pain, left No fractures noted on CXR. Likely costochondritis versus contusion.   - Pain control as listed above  # Atrial Fibrillation  Rates are mildly elevated secondary to pain with femur fracture.   - Amiodarone 200 mg twice daily - Holding Eliquis in the setting of surgery  # Type 2 Diabetes  Currently managed with diet only.  Patient states she was previously on insulin but was taken off due to hypoglycemia.  She checks her sugars at home twice daily; CBGs range between 90-120. A1c was 5.4% in July 2022.  - SSI, sensitive scale  # Anxiety  Depression Patient states that she is not currently taking Xanax anymore, but has not been taking her fluoxetine consistently either.  - Restart home fluoxetine  Diet: Heart Healthy/CM VTE: None IVF: None,None Code: DNR  Prior to Admission Living Arrangement: Home, living alone Anticipated Discharge Location: SNF Barriers to Discharge: Continued medical evaluation and surgery  Dispo: Admit patient to Inpatient with expected length of stay greater than 2 midnights.  Signed: Dr. Jose Persia Internal Medicine PGY-3 Pager: [phone] After 5pm on weekdays and 1pm on weekends: On Call pager [phone]  07/08/2021, 2:34 PM

## 2021-07-08 NOTE — ED Provider Notes (Signed)
[facility] EMERGENCY DEPARTMENT Provider Note   CSN: [id number] Arrival date & time: 07/07/21  2347     History Chief Complaint  Patient presents with   Arthritis pain   Fall    Friday    [name] [name] is a 75 y.o. female history includes obesity, GERD, diabetes, colon cancer, hypertension, sleep apnea, A. fib on Eliquis.  Patient presents to the ER for right knee pain, patient reports right knee pain began around 1 week ago, she reports several falls over the past week landing onto her right knee.  Most recent fall was Friday, 07/03/2021, patient has been unable to walk since that time.  EMS called to bring patient to the ER.  Patient reports the pain is severe constant sharp worsened with movement and ambulation minimally improved with rest, pain does not radiate.  Of note patient reports that sometime last night, unsure what time she developed some lower chest pain.  Patient reports that she has a hernia in her lower chest, she reports pain began after she was being helped to the bathroom while in the waiting room.  Pain is mild constant sharp nonradiating no alleviating factors, pain is worsened with palpation.  Denies fever/chills, numbness/weakness, cough/shortness of breath, tingling, head injury/loss consciousness, abdominal pain, nausea/vomiting or any additional concerns. HPI     Past Medical History:  Diagnosis Date   Anemia    Arthritis    Cancer ([place]) 2012   colon   Diabetes mellitus without complication ([place])    GERD (gastroesophageal reflux disease)    Headache    Hypertension    Sleep apnea    has cpap, does not use cpap on regular basis    Patient Active Problem List   Diagnosis Date Noted   Hypokalemia 04/10/2021   Atrial fibrillation ([place]) 04/10/2021   Type 2 diabetes mellitus ([place]) 04/10/2021   Right hip pain 04/10/2021   Hip pain 04/09/2021   Hypoglycemia 02/04/2021   Hypothermia 02/04/2021   Hypotension 02/04/2021   Atrial  fibrillation, new onset ([place]) 02/04/2021   AKI (acute kidney injury) ([place]) 02/04/2021   Atrial fibrillation with rapid ventricular response ([place]) 02/04/2021   HLD (hyperlipidemia) 12/18/2010   HYPERTENSION, BENIGN 12/18/2010   SHORTNESS OF BREATH 12/18/2010   OBSTRUCTIVE SLEEP APNEA 01/11/2008    Past Surgical History:  Procedure Laterality Date   ABDOMINAL HYSTERECTOMY     partial   BREAST BIOPSY Bilateral pt unsure   benign   COLON SURGERY  2012   no chemo or radiation done   COLONOSCOPY WITH PROPOFOL N/A 01/30/2015   Procedure: COLONOSCOPY WITH PROPOFOL;  Surgeon: Juanita Craver, MD;  Location: [facility] ENDOSCOPY;  Service: Endoscopy;  Laterality: N/A;   JOINT REPLACEMENT     left hip replacement x 2   left wrist surgery for fracture      right hand cortisone injection     cast put on     OB History   No obstetric history on file.     No family history on file.  Social History   Tobacco Use   Smoking status: Former    Packs/day: 0.25    Years: 10.00    Pack years: 2.50    Types: Cigarettes    Quit date: 11/29/1964    Years since quitting: 56.6   Smokeless tobacco: Never  Vaping Use   Vaping Use: Never used  Substance Use Topics   Alcohol use: No   Drug use: No    Home  Medications Prior to Admission medications   Medication Sig Start Date End Date Taking? Authorizing Provider  acetaminophen (TYLENOL) 650 MG CR tablet Take 1,300 mg by mouth every 8 (eight) hours as needed for pain.    [provider]  ALPRAZolam Duanne Moron) 0.25 MG tablet Take 1 tablet (0.25 mg total) by mouth 2 (two) times daily as needed for anxiety. 04/16/21   Geradine Girt, DO  amiodarone (PACERONE) 200 MG tablet Take 200 mg by mouth 2 (two) times daily. 03/23/21   [provider]  apixaban (ELIQUIS) 5 MG TABS tablet Take 1 tablet (5 mg total) by mouth 2 (two) times daily. 02/08/21   Lavina Hamman, MD  diclofenac Sodium (VOLTAREN) 1 % GEL Apply 4 g topically 4 (four) times daily.  04/20/21   Geradine Girt, DO  famotidine (PEPCID) 40 MG tablet Take 40 mg by mouth 2 (two) times daily. 11/16/14   [provider]  ferrous sulfate 325 (65 FE) MG tablet Take 325 mg by mouth 3 (three) times daily with meals.    [provider]  FLUoxetine (PROZAC) 40 MG capsule Take 40 mg by mouth daily. 02/14/21   [provider]  HYDROcodone-acetaminophen (NORCO/VICODIN) 5-325 MG tablet Take 1 tablet by mouth every 6 (six) hours as needed for moderate pain. 04/16/21   Geradine Girt, DO  lidocaine (LIDODERM) 5 % Place 1 patch onto the skin daily. Remove & Discard patch within 12 hours or as directed by MD 04/16/21   Geradine Girt, DO  Magnesium Oxide 400 MG CAPS Take 1 capsule (400 mg total) by mouth daily. 02/08/21   Lavina Hamman, MD  Multiple Vitamins-Minerals (ONE-A-DAY WOMENS 50+) TABS Take 1 tablet by mouth daily.    [provider]  omeprazole (PRILOSEC) 40 MG capsule Take 40 mg by mouth daily. 02/14/21   [provider]  polyethylene glycol powder (GLYCOLAX/MIRALAX) 17 GM/SCOOP powder Take 17 g by mouth daily. 02/24/21   [provider]  simvastatin (ZOCOR) 80 MG tablet Take 80 mg by mouth at bedtime. 11/13/14   [provider]    Allergies    Ace inhibitors, Morphine and related, Codeine, and Oxycodone-acetaminophen  Review of Systems   Review of Systems Ten systems are reviewed and are negative for acute change except as noted in the HPI  Physical Exam Updated Vital Signs BP (!) 163/90 (BP Location: Left Arm)   Pulse (!) 102   Temp 98.1 F (36.7 C) (Oral)   Resp 20   SpO2 96%   Physical Exam Constitutional:      General: She is not in acute distress.    Appearance: Normal appearance. She is well-developed. She is obese. She is not ill-appearing or diaphoretic.  HENT:     Head: Normocephalic and atraumatic.  Eyes:     General: Vision grossly intact. Gaze aligned appropriately.     Pupils: Pupils are equal,  round, and reactive to light.  Neck:     Trachea: Trachea and phonation normal.  Pulmonary:     Effort: Pulmonary effort is normal. No respiratory distress.  Chest:       Comments: Patient's area of pain indicated above feels to be a bony process on the sternum? Abdominal:     General: There is no distension.     Palpations: Abdomen is soft.     Tenderness: There is no abdominal tenderness. There is no guarding or rebound.  Musculoskeletal:        General:  Normal range of motion.     Cervical back: Normal range of motion.  Skin:    General: Skin is warm and dry.  Neurological:     Mental Status: She is alert.     GCS: GCS eye subscore is 4. GCS verbal subscore is 5. GCS motor subscore is 6.     Comments: Speech is clear and goal oriented, follows commands Major Cranial nerves without deficit, no facial droop Moves extremities without ataxia, coordination intact  Psychiatric:        Behavior: Behavior normal.    ED Results / Procedures / Treatments   Labs (all labs ordered are listed, but only abnormal results are displayed) Labs Reviewed  CBC WITH DIFFERENTIAL/PLATELET - Abnormal; Notable for the following components:      Result Value   RBC 3.70 (*)    Hemoglobin 11.3 (*)    HCT 35.0 (*)    All other components within normal limits  COMPREHENSIVE METABOLIC PANEL - Abnormal; Notable for the following components:   Glucose, Bld 126 (*)    Total Protein 6.3 (*)    Albumin 3.4 (*)    All other components within normal limits  URINALYSIS, ROUTINE W REFLEX MICROSCOPIC  TROPONIN I (HIGH SENSITIVITY)    EKG EKG Interpretation  Date/Time:  Wednesday July 08 2021 08:44:21 EDT Ventricular Rate:  106 PR Interval:    QRS Duration: 103 QT Interval:  385 QTC Calculation: 512 R Axis:   51 Text Interpretation: Atrial fibrillation Nonspecific T abnrm, anterolateral leads Prolonged QT interval No significant change since last tracing EARLIER SAME DATE Confirmed by Calvert Cantor [phone]) on 07/08/2021 8:52:22 AM  Radiology CT Knee Right Wo Contrast  Result Date: 07/08/2021 CLINICAL DATA:  Fracture, knee EXAM: CT OF THE right KNEE WITHOUT CONTRAST TECHNIQUE: Multidetector CT imaging of the right knee was performed according to the standard protocol. Multiplanar CT image reconstructions were also generated. COMPARISON:  Same-day radiograph. FINDINGS: Bones/Joint/Cartilage There is a an obliquely oriented distal femur fracture extending from the medial metadiaphysis through the intercondylar notch into the knee joint. There is a moderate-sized joint effusion. There is moderate-severe tricompartment osteoarthritis with bulky osteophyte formation of the knee. There is an ossified osteochondral joint body anteriorly in the knee joint. There is 7 mm articular surface depression of the anterior lateral tibial plateau (series 9, image 56). No evidence of patellar fracture. Ligaments Suboptimally assessed by CT. Muscles and Tendons Mild generalized atrophy of the hamstrings. Soft tissues Soft tissue swelling along the knee. IMPRESSION: Obliquely oriented intra-articular distal femur fracture, extending from the medial metadiaphysis through the intercondylar notch into the knee joint. Moderate size joint effusion. Articular surface depression of the anterior lateral tibial plateau by 7 mm compatible with fracture. Moderate-severe tricompartment osteoarthritis of the knee with bulky osteophyte formation and a 1.2 cm osteochondral joint body anteriorly. Electronically Signed   By: Maurine Simmering   On: 07/08/2021 07:47   DG Knee Complete 4 Views Right  Result Date: 07/08/2021 CLINICAL DATA:  Recent fall with right knee pain, initial encounter EXAM: RIGHT KNEE - COMPLETE 4+ VIEW COMPARISON:  None. FINDINGS: There is a an oblique fracture through the distal right femoral metaphysis and extending into the intercondylar notch separating the lateral and medial condyles. Tricompartmental  degenerative changes are seen. Small joint effusion is noted. IMPRESSION: Oblique fracture through the distal femur extending into the intercondylar notch. Small joint effusion is seen. Tricompartmental degenerative changes are noted. Electronically Signed   By: Elta [place]  Lukens M.D.   On: 07/08/2021 00:44    Procedures Procedures   Medications Ordered in ED Medications  fentaNYL (SUBLIMAZE) injection 50 mcg (has no administration in time range)    ED Course  I have reviewed the triage vital signs and the nursing notes.  Pertinent labs & imaging results that were available during my care of the patient were reviewed by me and considered in my medical decision making (see chart for details).  Clinical Course as of 07/08/21 1041  Wed Jul 08, 2021  1019 Dr. Charleen Kirks [BM]    Clinical Course User Index [BM] Gari Crown   MDM Rules/Calculators/A&P                           Additional history obtained from: Nursing notes from this visit. Family, patient's sister at bedside. Review of electronic medical records. ---------------------- I reviewed and interpreted labs which include: Initial high-sensitivity troponin within normal limits COVID/influenza panel negative CMP shows no emergent electrolyte derangement, AKI, LFT elevations or gap. CBC shows anemia 11.3, no leukocytosis or thrombocytopenia.  EKG: Atrial fibrillation Nonspecific T abnrm, anterolateral leads Prolonged QT interval No significant change since last tracing EARLIER SAME DATE Confirmed by Calvert Cantor [phone]) on 07/08/2021 8:52:22 AM  CT Head/Cspine:  IMPRESSION:  No acute intracranial abnormalities.     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     No acute cervical spine abnormalities.     Aortic Atherosclerosis (ICD10-I70.0).   CXR:  IMPRESSION:  1. Low lung volumes. Otherwise, no radiographic evidence of acute  cardiopulmonary process.  2. No fracture identified.   DG  Right Knee:  IMPRESSION:  Oblique fracture through the distal femur extending into the  intercondylar notch. Small joint effusion is seen.     Tricompartmental degenerative changes are noted.   CT Right Knee:  IMPRESSION:  Obliquely oriented intra-articular distal femur fracture, extending  from the medial metadiaphysis through the intercondylar notch into  the knee joint. Moderate size joint effusion.     Articular surface depression of the anterior lateral tibial plateau  by 7 mm compatible with fracture.     Moderate-severe tricompartment osteoarthritis of the knee with bulky  osteophyte formation and a 1.2 cm osteochondral joint body  anteriorly.  --- Consult called to Hilbert Odor orthopedic PA-C.  Will be by to evaluate patient. - 10:19 AM: Consult with internal medicine, Dr. Charleen Kirks.  Patient was accepted for admission. - Patient reassessed, resting comfortably in bed no acute distress.  Patient states understanding of findings above and care plan.  Patient is agreeable for admission.  Delta troponin and urinalysis currently pending.  Patient denies any other areas of complaints or concerns no additional imaging is indicated at this time.  Patient was seen and evaluated by attending physician Dr. Karle Starch who agrees with work-up above and admission.   Note: Portions of this report may have been transcribed using voice recognition software. Every effort was made to ensure accuracy; however, inadvertent computerized transcription errors may still be present.  Final Clinical Impression(s) / ED Diagnoses Final diagnoses:  None    Rx / DC Orders ED Discharge Orders     None        Gari Crown 07/08/21 1046    Truddie Hidden, MD 07/08/21 1108

## 2021-07-08 NOTE — ED Notes (Signed)
Report given to Napoleon Form, RN of [phone]

## 2021-07-09 LAB — GLUCOSE, CAPILLARY
Glucose-Capillary: 152 mg/dL — ABNORMAL HIGH (ref 70–99)
Glucose-Capillary: 120 mg/dL — ABNORMAL HIGH (ref 70–99)

## 2021-07-09 LAB — SURGICAL PCR SCREEN
MRSA, PCR: NEGATIVE
Staphylococcus aureus: POSITIVE — AB

## 2021-07-09 LAB — VITAMIN D 25 HYDROXY (VIT D DEFICIENCY, FRACTURES): Vit D, 25-Hydroxy: 18.94 ng/mL — ABNORMAL LOW (ref 30–100)

## 2021-07-09 MED ORDER — CHLORHEXIDINE GLUCONATE CLOTH 2 % EX PADS
6.0000 | MEDICATED_PAD | Freq: Every day | CUTANEOUS | Status: AC
Start: 2021-07-10 — End: 2021-07-15
  Administered 2021-07-10 – 2021-07-13 (×3): 6 via TOPICAL

## 2021-07-09 MED ORDER — VITAMIN D 25 MCG (1000 UNIT) PO TABS
2000.0000 [IU] | ORAL_TABLET | Freq: Two times a day (BID) | ORAL | Status: DC
  Administered 2021-07-09 – 2021-07-17 (×15): 2000 [IU] via ORAL
  Filled 2021-07-09 (×15): qty 2

## 2021-07-09 MED ORDER — ADULT MULTIVITAMIN W/MINERALS CH
1.0000 | ORAL_TABLET | Freq: Every day | ORAL | Status: DC
  Administered 2021-07-09 – 2021-07-17 (×8): 1 via ORAL
  Filled 2021-07-09 (×8): qty 1

## 2021-07-09 MED ORDER — INSULIN ASPART 100 UNIT/ML IJ SOLN: 0.0000 [IU] | Freq: Every day | INTRAMUSCULAR | Status: DC

## 2021-07-09 MED ORDER — INSULIN ASPART 100 UNIT/ML IJ SOLN
0.0000 [IU] | Freq: Three times a day (TID) | INTRAMUSCULAR | Status: DC
  Administered 2021-07-09 – 2021-07-12 (×6): 3 [IU] via SUBCUTANEOUS
  Administered 2021-07-12 – 2021-07-14 (×3): 2 [IU] via SUBCUTANEOUS
  Administered 2021-07-14: 3 [IU] via SUBCUTANEOUS
  Administered 2021-07-15: 2 [IU] via SUBCUTANEOUS
  Administered 2021-07-15 – 2021-07-16 (×2): 3 [IU] via SUBCUTANEOUS
  Administered 2021-07-17: 2 [IU] via SUBCUTANEOUS

## 2021-07-09 MED ORDER — CEFAZOLIN SODIUM-DEXTROSE 2-4 GM/100ML-% IV SOLN
2.0000 g | Freq: Once | INTRAVENOUS | Status: AC
  Administered 2021-07-10: 2 g via INTRAVENOUS
  Filled 2021-07-09 (×2): qty 100

## 2021-07-09 MED ORDER — MUPIROCIN 2 % EX OINT
1.0000 | TOPICAL_OINTMENT | Freq: Two times a day (BID) | CUTANEOUS | Status: AC
Start: 2021-07-09 — End: 2021-07-14
  Administered 2021-07-09 – 2021-07-14 (×9): 1 via NASAL
  Filled 2021-07-09 (×5): qty 22

## 2021-07-09 MED ORDER — GLUCERNA SHAKE PO LIQD
237.0000 mL | Freq: Three times a day (TID) | ORAL | Status: DC
  Administered 2021-07-09 – 2021-07-17 (×18): 237 mL via ORAL

## 2021-07-09 NOTE — Plan of Care (Signed)
  Problem: Activity: Goal: Risk for activity intolerance will decrease Outcome: Progressing   Problem: Pain Managment: Goal: General experience of comfort will improve Outcome: Progressing   Problem: Safety: Goal: Ability to remain free from injury will improve Outcome: Progressing   

## 2021-07-09 NOTE — Plan of Care (Signed)

## 2021-07-09 NOTE — Anesthesia Preprocedure Evaluation (Addendum)
Anesthesia Evaluation  Patient identified by MRN, date of birth, ID band Patient awake    Reviewed: Allergy & Precautions, NPO status , Patient's Chart, lab work & pertinent test results  History of Anesthesia Complications Negative for: history of anesthetic complications  Airway Mallampati: II  TM Distance: >3 FB Neck ROM: Full    Dental  (+) Edentulous Upper, Missing, Poor Dentition, Chipped,    Pulmonary sleep apnea (intermittent compliance with CPAP) , former smoker,    Pulmonary exam normal        Cardiovascular hypertension, Normal cardiovascular exam+ dysrhythmias (on amiodarone and Xarelto) Atrial Fibrillation      Neuro/Psych negative neurological ROS  negative psych ROS   GI/Hepatic Neg liver ROS, GERD  ,  Endo/Other  diabetes, Type 2  Renal/GU negative Renal ROS  negative genitourinary   Musculoskeletal  (+) Arthritis , right distal femur fracture   Abdominal   Peds  Hematology  (+) anemia , Hgb 11.3, plt 302k   Anesthesia Other Findings Day of surgery medications reviewed with patient.  Reproductive/Obstetrics negative OB ROS                            Anesthesia Physical Anesthesia Plan  ASA: 3  Anesthesia Plan: General   Post-op Pain Management:    Induction: Intravenous  PONV Risk Score and Plan: 4 or greater and Treatment may vary due to age or medical condition, Dexamethasone and Ondansetron  Airway Management Planned: Oral ETT  Additional Equipment: None  Intra-op Plan:   Post-operative Plan: Extubation in OR  Informed Consent: I have reviewed the patients History and Physical, chart, labs and discussed the procedure including the risks, benefits and alternatives for the proposed anesthesia with the patient or authorized representative who has indicated his/her understanding and acceptance.   Patient has DNR.  Discussed DNR with patient and Continue DNR.    Dental advisory given  Plan Discussed with: CRNA  Anesthesia Plan Comments: (DNR status discussed with patient. She would like to continue DNR status perioperatively. Daiva Huge, MD)      Anesthesia Quick Evaluation
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
## 2021-07-09 NOTE — Progress Notes (Addendum)
Subjective: I seen and evaluated Ms. Disney at bedside.  She was accompanied by her sister, Constance Holster.  Ms. Orfield states she is feeling okay. She states pain is elicited in her chest and right knee if she moves around in the bed, otherwise no pain.  Ms. Savitt explained that yesterday while staff was transferring her to another bed they bumped her left chest and there is tenderness beneath her left breast.  She was concerned that her left rib was fractured.  We reassured her that the chest x-ray in the ED negative for rib fracture.  She denies lightheadedness, dizziness, SOB, chest pain, palpitations, abdominal discomfort, ankle pain or swelling of the lower extremities.  Objective:  Vital signs in last 24 hours: Vitals:   07/08/21 1415 07/08/21 1430 07/08/21 1608 07/08/21 2219  BP: 127/72 133/65 132/67 119/64  Pulse: 100 99 88 83  Resp: '15 15  18  '$ Temp:   98.8 F (37.1 C) 99.1 F (37.3 C)  TempSrc:   Oral Oral  SpO2: 94% 93% 96% 100%  Weight:      Height:       Physical Exam Constitutional:      General: She is awake.  HENT:     Head: Normocephalic and atraumatic.  Cardiovascular:     Rate and Rhythm: Normal rate. Rhythm irregular.     Pulses:          Dorsalis pedis pulses are 2+ on the right side and 2+ on the left side.  Pulmonary:     Effort: Pulmonary effort is normal.     Breath sounds: Normal breath sounds.  Chest:     Comments: Tenderness to palpation of area beneath left breast. Musculoskeletal:     Right knee: Swelling and erythema present. Decreased range of motion.     Left knee: No swelling.     Right lower leg: No edema.     Left lower leg: No edema.     Comments: Right knee brace present. The expsoed knee cap throuhg the brace reveals joint swelling and warm to tough.  Feet:     Comments: Patient was able to wiggle her toes bilaterally. Sensation grossly intact Skin:    General: Skin is warm and dry.  Neurological:     General: No focal deficit present.      Mental Status: She is alert.  Psychiatric:        Mood and Affect: Mood normal.        Behavior: Behavior normal. Behavior is cooperative.     Assessment/Plan:  Active Problems:   Femur fracture, right [facility])  Ms. Keirstyn Mhoon is a 75 y/o female with a PMHx of T2DM, HTN, A. Fib, osteoporosis who is currently admitted for a distal femur fracture secondary to mechanical fall.    # Distal Femur Fracture, right # Osteoporosis  ORIF scheduled on August 12 per orthopedic surgery.  X-ray of the knee reveals oblique fracture through the distal femur extending into the intercondylar notch.  CT with contrast of the knee reveals same findings with the addition of moderate sized joint effusion.  On physical exam patient was able to wiggle her toes and sensation of the right leg was grossly intact.  Dorsalis pedis pulse of the right foot intact, +2.  Patient denies any numbness or tingling of the right leg. Unlikely to have damage to vessels or nerves, however surgery will determine. Per chart review, patient had a DEXA scan in 2017 that was consistent with osteoporosis.  Patient states she has never been prescribed bisphosphonate therapy since being diagnosed with osteoporosis.  Will consider initiating bisphosphonate therapy in the outpatient setting to reduce fracture risk.  Labs reveal calcium level within normal limits at 9 and low vitamin D levels at 18.94.  Patient is interested in going to a nursing facility after surgery and discharge. Patient lives alone and requires her sister to come over and assist her with daily ADLs including bathing.  Patient has had frequent falls in the last 8 months, patient states most of her falls occur at night when she seemingly falls out of bed in her sleep. She awakens on the floor in the morning.  Rails have since been put up on her bed last week, she reports. --TOC consult for SNF placement  - NPO at midnight on 8/11 in preparation for surgery tomorrow -  Holding Eliquis - Tylenol, Norco, and Dilaudid for pain control --Will order PT/OT post-op   # Rib Pain, left No fractures noted on CXR. Pain elicited with palpation of area under left breast. Likely costochondritis versus contusion.   - Pain control as listed above   # Atrial Fibrillation  - Amiodarone 200 mg twice daily - Holding Eliquis in the setting of surgery   # Type 2 Diabetes  Currently managed with diet only.  Patient states she was previously on insulin but was taken off due to hypoglycemia in March 2022.  She checks her sugars at home twice daily; CBGs range between 90-120. A1c was 5.4% in July 2022. Last CBG taken around midnight was 126. - SSI, sensitive scale   # Anxiety  Depression  - Restart home fluoxetine   Diet: Heart Healthy/CM VTE: None IVF: None,None Code: DNR  Prior to Admission Living Arrangement: Anticipated Discharge Location: Barriers to Discharge: Dispo: Anticipated discharge in approximately 2-3 day(s).   Timothy Lasso, MD 07/09/2021, 6:52 AM Pager: [phone] After 5pm on weekdays and 1pm on weekends: On Call pager [phone]

## 2021-07-10 ENCOUNTER — Encounter (HOSPITAL_COMMUNITY): Payer: Self-pay | Admitting: Internal Medicine

## 2021-07-10 ENCOUNTER — Inpatient Hospital Stay (HOSPITAL_COMMUNITY): Payer: Medicare HMO | Admitting: Anesthesiology

## 2021-07-10 ENCOUNTER — Inpatient Hospital Stay (HOSPITAL_COMMUNITY): Payer: Medicare HMO

## 2021-07-10 ENCOUNTER — Encounter (HOSPITAL_COMMUNITY)
Admission: EM | Disposition: A | Discharge status: Skilled Nursing Facility | Payer: Self-pay | Source: Home / Self Care | Attending: Internal Medicine

## 2021-07-10 HISTORY — PX: ORIF FEMUR FRACTURE: SHX2119

## 2021-07-10 LAB — GLUCOSE, CAPILLARY
Glucose-Capillary: 139 mg/dL — ABNORMAL HIGH (ref 70–99)
Glucose-Capillary: 163 mg/dL — ABNORMAL HIGH (ref 70–99)
Glucose-Capillary: 161 mg/dL — ABNORMAL HIGH (ref 70–99)
Glucose-Capillary: 194 mg/dL — ABNORMAL HIGH (ref 70–99)

## 2021-07-10 IMAGING — DX DG KNEE 1-2V PORT*R*
1 series · 2 of 2 positions shown · non-contrast
Comparison: Preoperative radiograph [DATE]

CLINICAL DATA: Postop.

EXAM:
PORTABLE RIGHT KNEE - 1-2 VIEW

[Series 1: knee · 0.14mm/px · 2 of 2 slices shown]
[im 1/2]
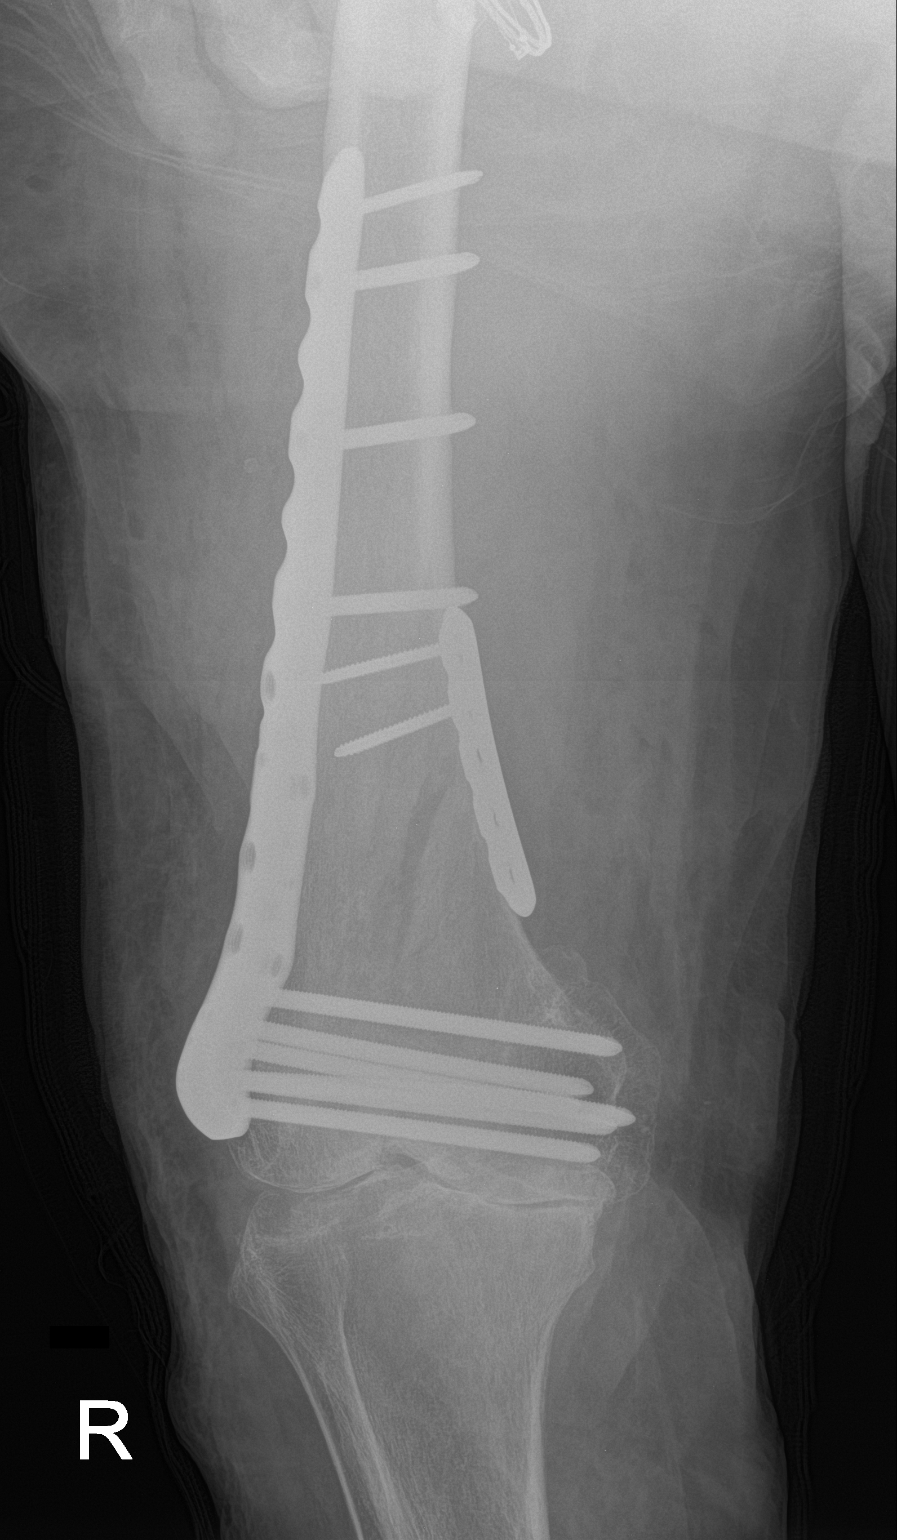
[im 2/2]
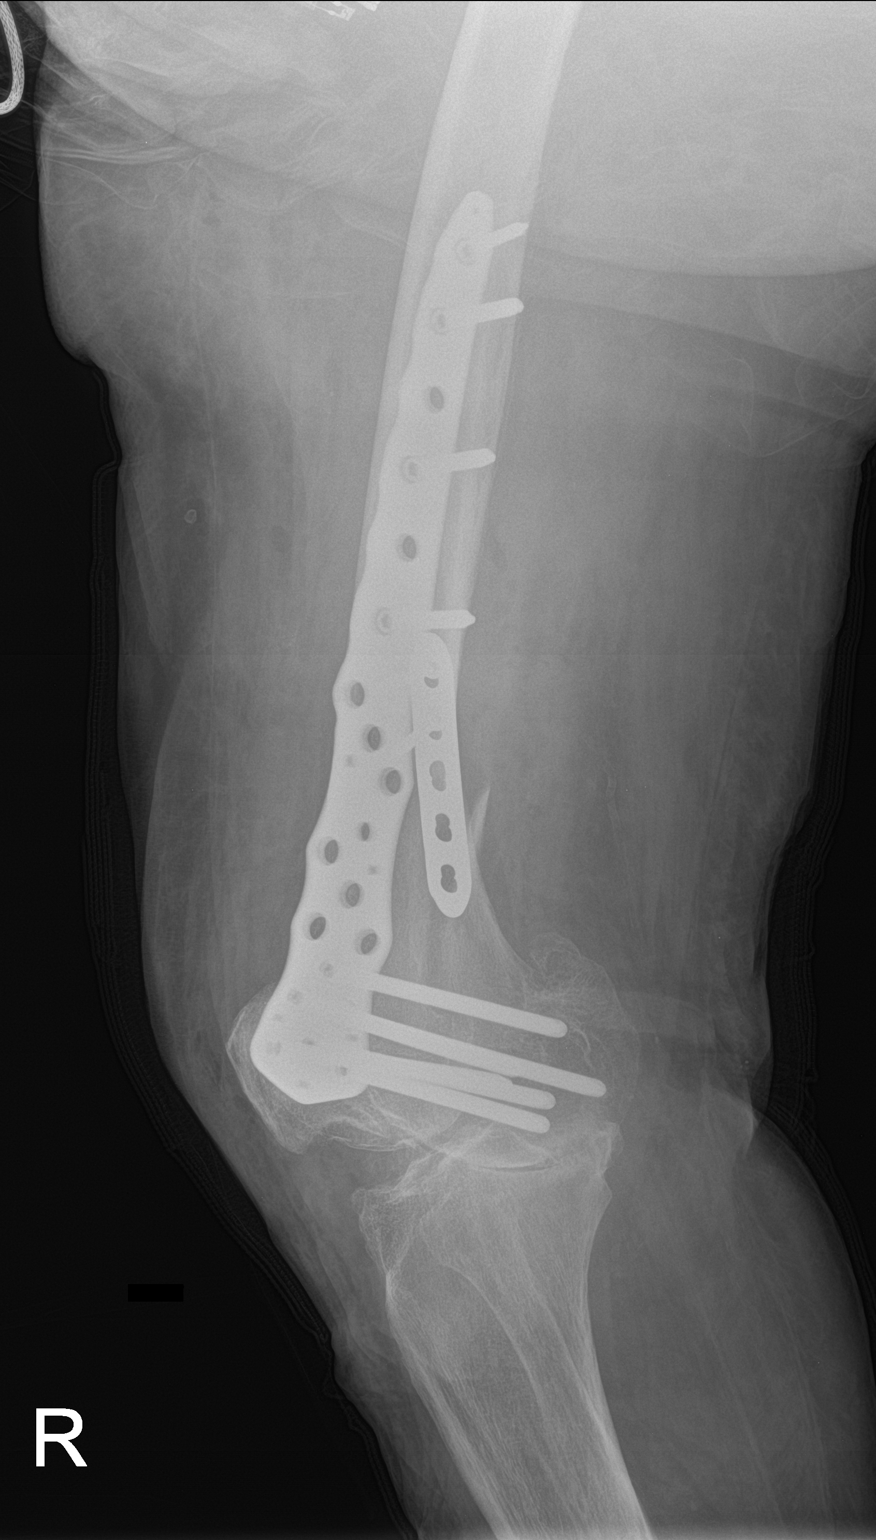

[2 of 2 positions shown; findings below may reference images not displayed]

FINDINGS: ORIF distal femur fracture. Lateral plate and multi screw fixation.
Smaller medial plate and multi screw fixation. Fracture is in
improved alignment from preoperative imaging. Recent postsurgical
change includes air and edema in the soft tissues. Moderate to
advanced knee osteoarthritis is seen.
IMPRESSION: ORIF distal femur fracture in improved alignment from preoperative
imaging. No immediate postoperative complication.

## 2021-07-10 IMAGING — RF DG C-ARM 1-60 MIN
1 series · 8 of 8 positions shown · non-contrast
Comparison: [DATE].

CLINICAL DATA: Surgery, elective [VF] ([VF]-CM)

EXAM:
DG C-ARM 1-60 MIN; RIGHT FEMUR 2 VIEWS
FLUOROSCOPY TIME:  Fluoroscopy Time:  1 minutes and 28 seconds.
Radiation Exposure Index (if provided by the fluoroscopic device):
11.3 mGy.
Number of Acquired Spot Images: 8

[Series 1: run · 8 of 8 slices shown]
[im 1/8]
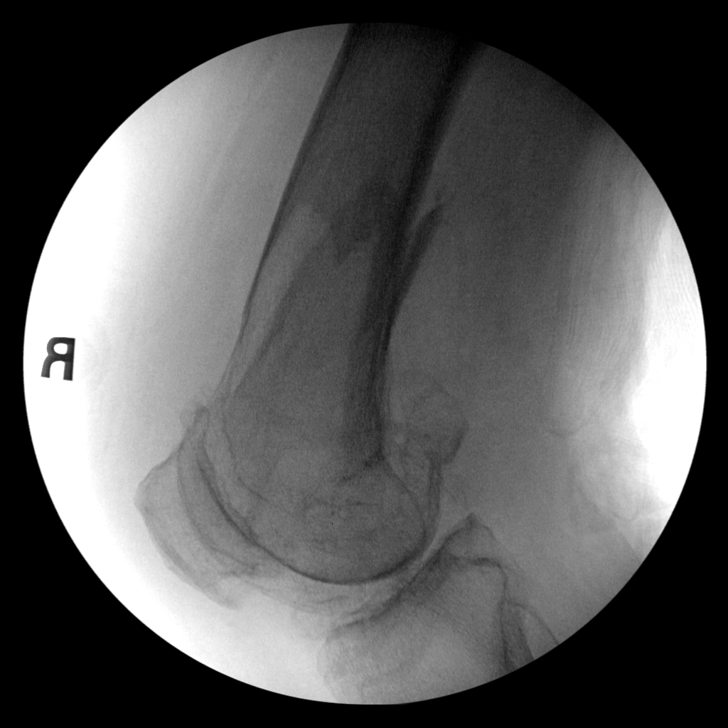
[im 2/8]
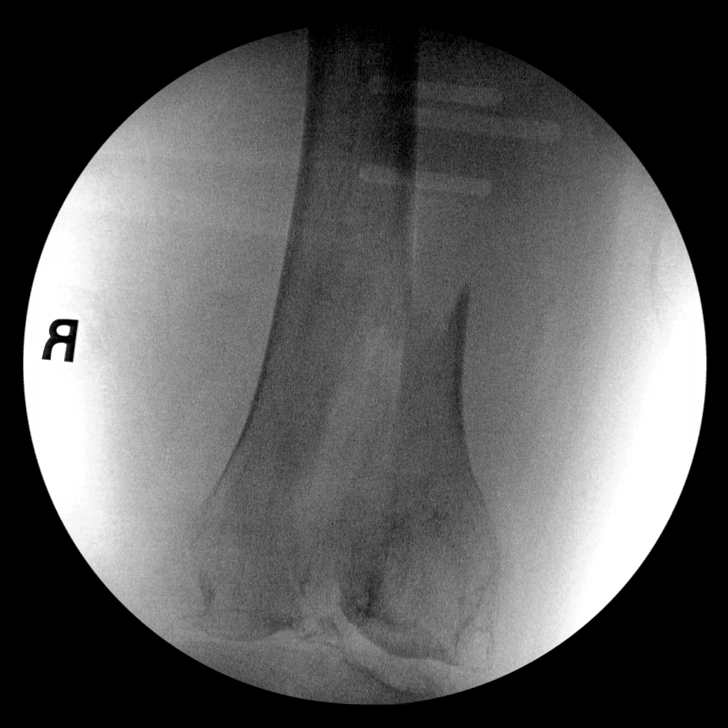
[im 3/8]
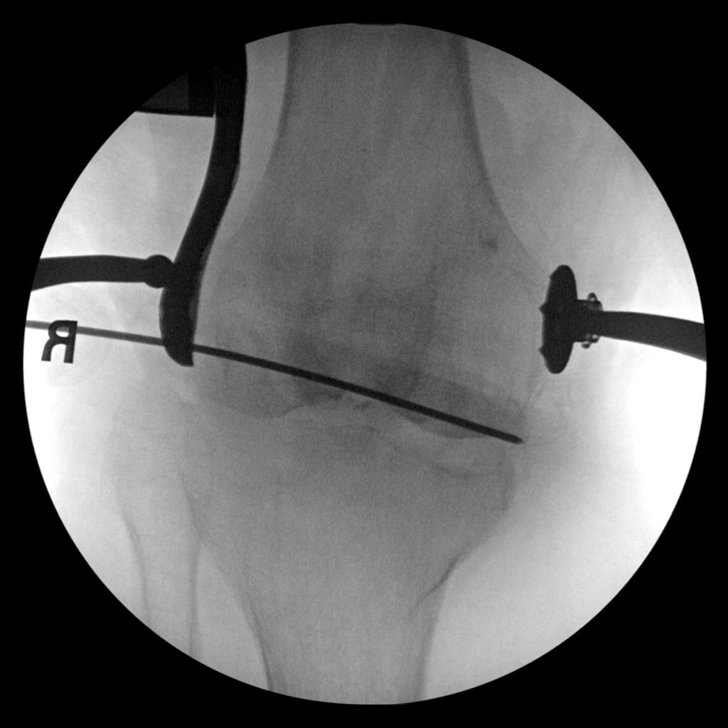
[im 4/8]
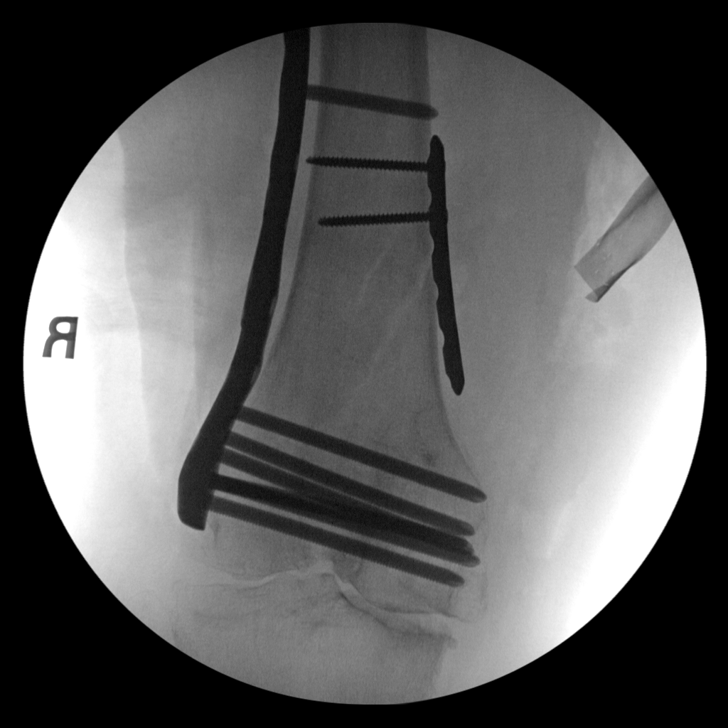
[im 5/8]
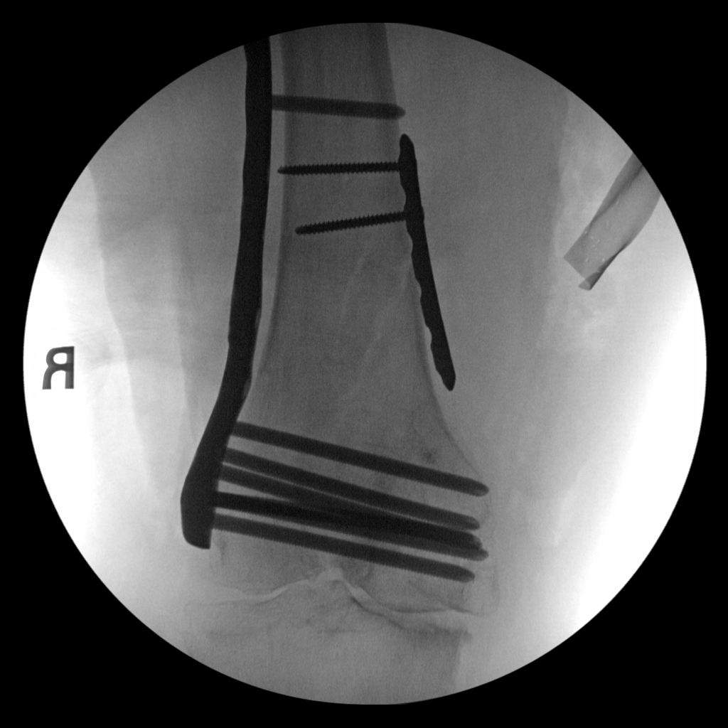
[im 6/8]
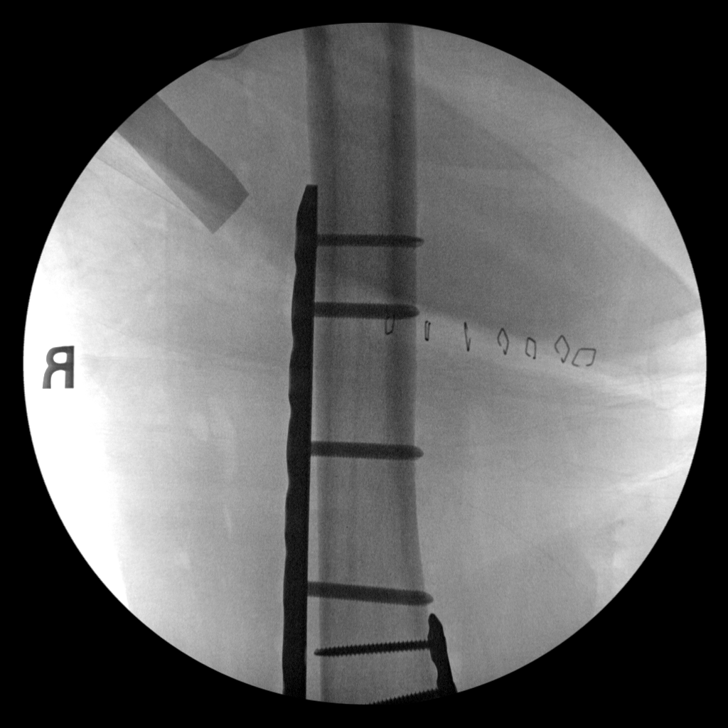
[im 7/8]
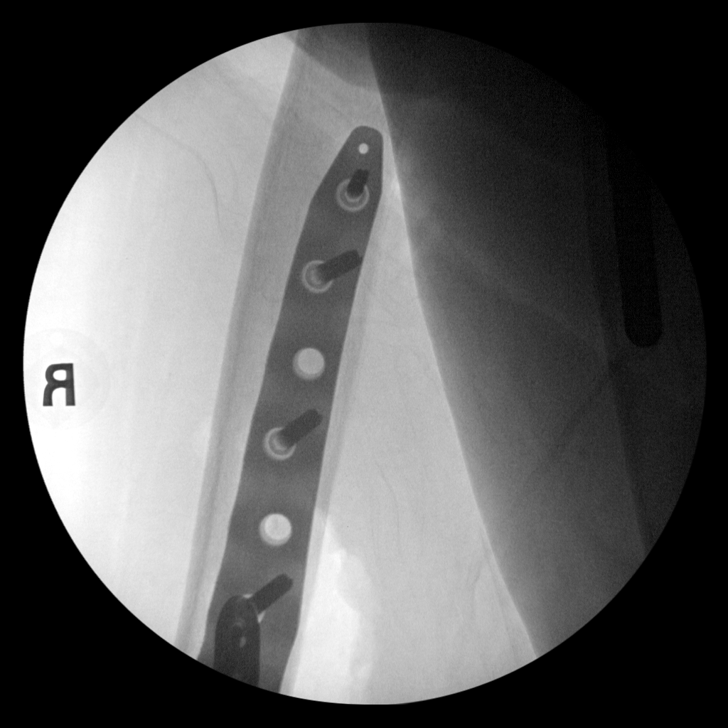
[im 8/8]
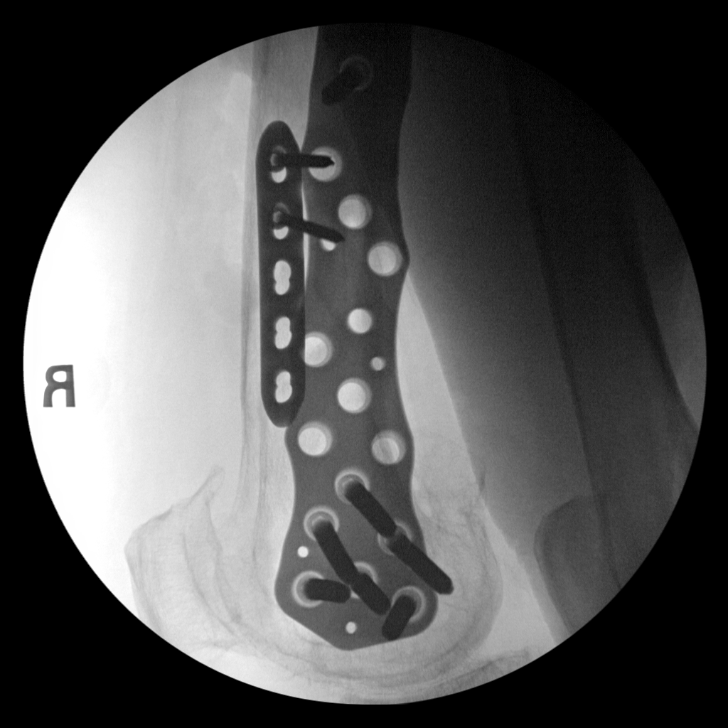

[8 of 8 positions shown; findings below may reference images not displayed]

FINDINGS: Eight C-arm fluoroscopic images were obtained intraoperatively and
submitted for post operative interpretation. These images
demonstrate plate and screw fixation of the distal tibia with
improved, near anatomic alignment. Please see the performing
provider's procedural report for further detail.
IMPRESSION: Intraoperative fluoroscopy, as detailed above.

## 2021-07-10 IMAGING — RF DG FEMUR 2+V*R*
1 series · 8 of 8 positions shown · non-contrast
Comparison: [DATE].

CLINICAL DATA: Surgery, elective [VF] ([VF]-CM)

EXAM:
DG C-ARM 1-60 MIN; RIGHT FEMUR 2 VIEWS
FLUOROSCOPY TIME:  Fluoroscopy Time:  1 minutes and 28 seconds.
Radiation Exposure Index (if provided by the fluoroscopic device):
11.3 mGy.
Number of Acquired Spot Images: 8

[Series 1: run · 8 of 8 slices shown]
[im 1/8]
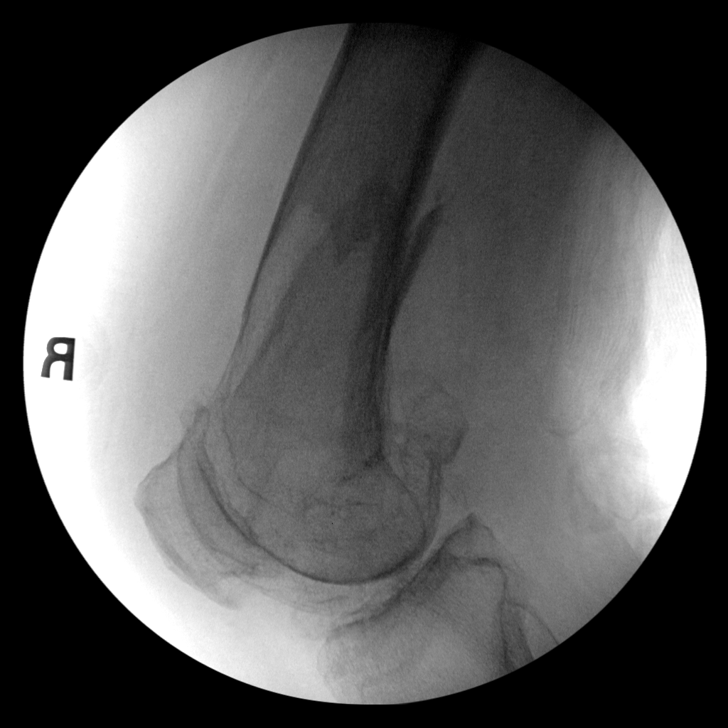
[im 2/8]
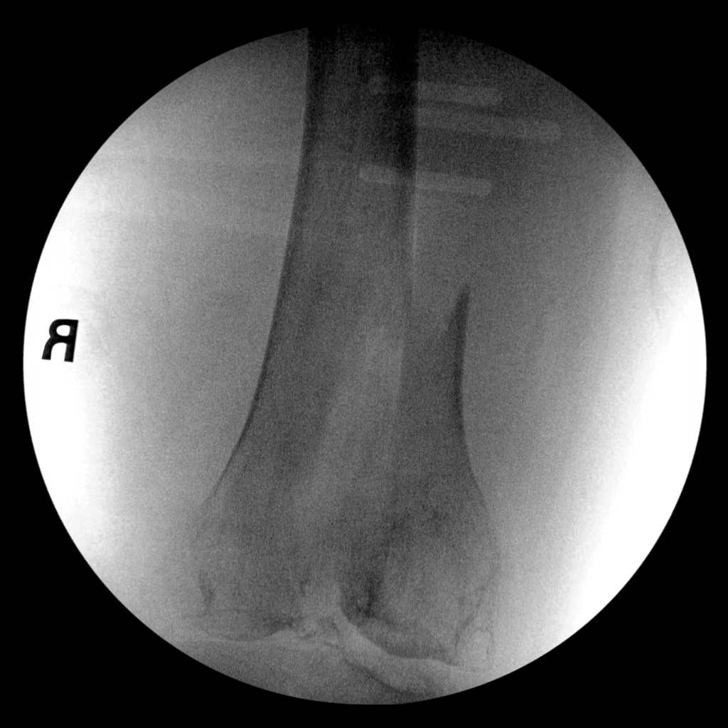
[im 3/8]
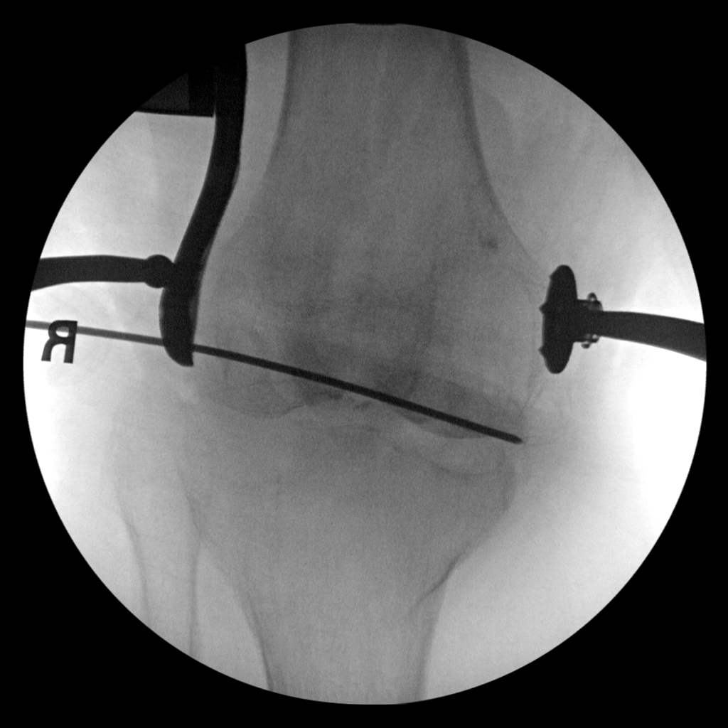
[im 4/8]
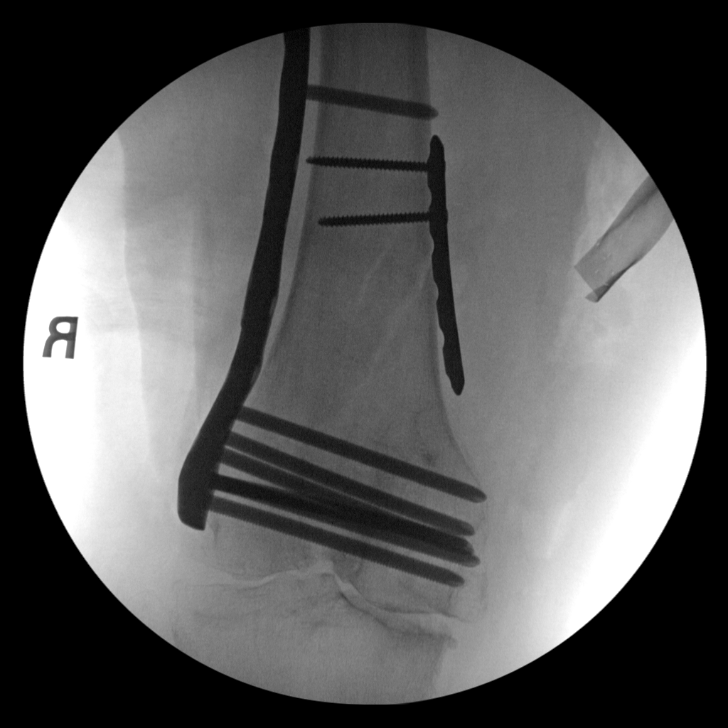
[im 5/8]
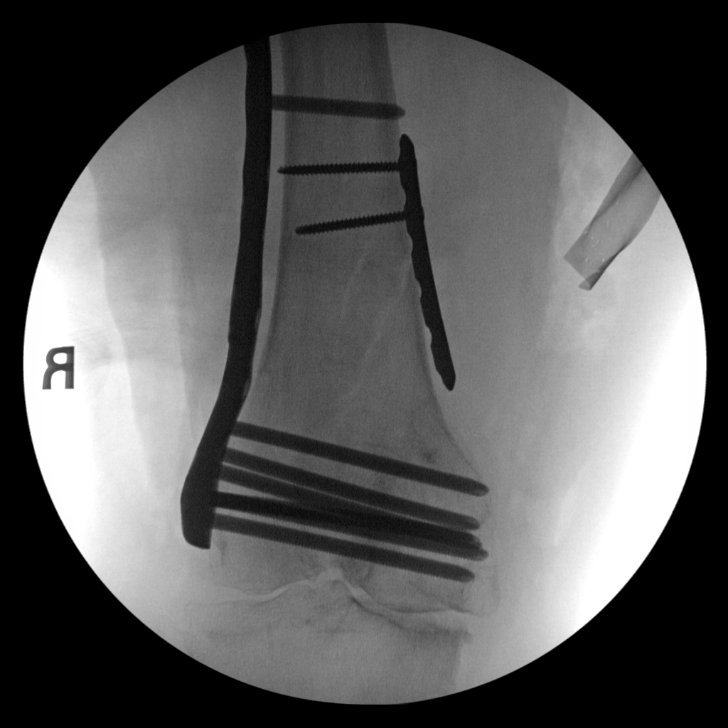
[im 6/8]
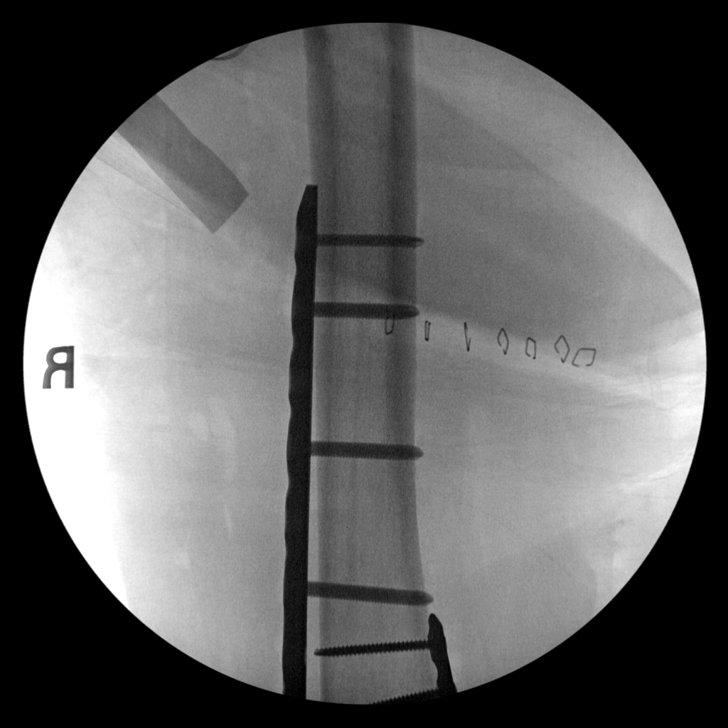
[im 7/8]
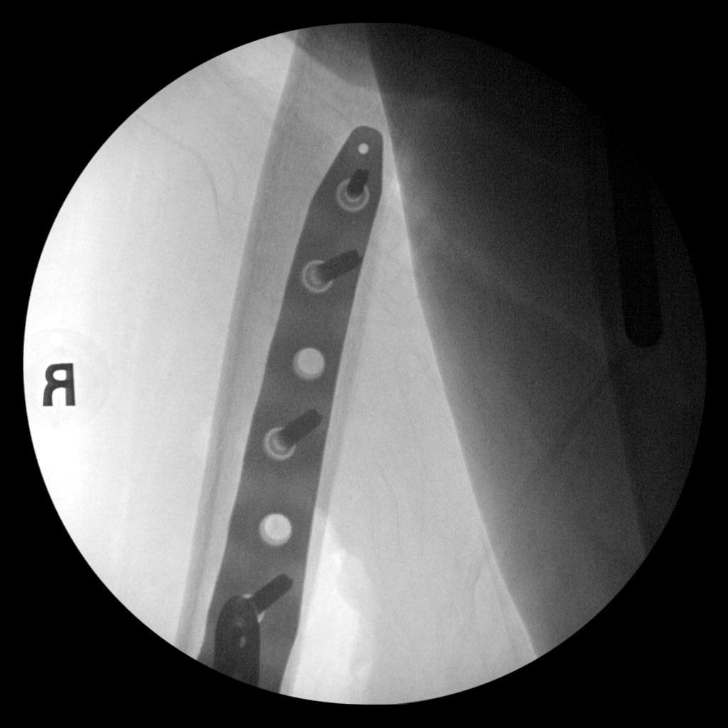
[im 8/8]
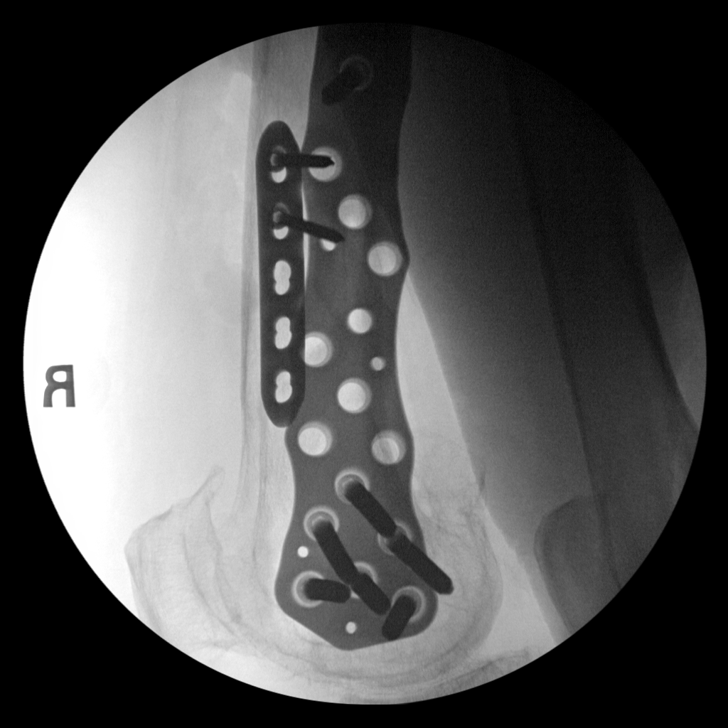

[8 of 8 positions shown; findings below may reference images not displayed]

FINDINGS: Eight C-arm fluoroscopic images were obtained intraoperatively and
submitted for post operative interpretation. These images
demonstrate plate and screw fixation of the distal tibia with
improved, near anatomic alignment. Please see the performing
provider's procedural report for further detail.
IMPRESSION: Intraoperative fluoroscopy, as detailed above.

## 2021-07-10 SURGERY — OPEN REDUCTION INTERNAL FIXATION (ORIF) DISTAL FEMUR FRACTURE
Anesthesia: General | Laterality: Right

## 2021-07-10 MED ORDER — LIDOCAINE HCL (CARDIAC) PF 100 MG/5ML IV SOSY
PREFILLED_SYRINGE | INTRAVENOUS | Status: DC | PRN
  Administered 2021-07-10: 100 mg via INTRAVENOUS

## 2021-07-10 MED ORDER — ORAL CARE MOUTH RINSE: 15.0000 mL | Freq: Once | OROMUCOSAL | Status: DC

## 2021-07-10 MED ORDER — OXYCODONE HCL 5 MG/5ML PO SOLN: 5.0000 mg | Freq: Once | ORAL | Status: DC | PRN

## 2021-07-10 MED ORDER — GLYCOPYRROLATE PF 0.2 MG/ML IJ SOSY
PREFILLED_SYRINGE | INTRAMUSCULAR | Status: AC
  Filled 2021-07-10: qty 1

## 2021-07-10 MED ORDER — CHLORHEXIDINE GLUCONATE 0.12 % MT SOLN
OROMUCOSAL | Status: AC
  Administered 2021-07-10: 15 mL
  Filled 2021-07-10: qty 15

## 2021-07-10 MED ORDER — OXYCODONE HCL 5 MG PO TABS: 5.0000 mg | ORAL_TABLET | Freq: Once | ORAL | Status: DC | PRN

## 2021-07-10 MED ORDER — LACTATED RINGERS IV SOLN: INTRAVENOUS | Status: DC

## 2021-07-10 MED ORDER — DEXAMETHASONE SODIUM PHOSPHATE 10 MG/ML IJ SOLN
INTRAMUSCULAR | Status: DC | PRN
  Administered 2021-07-10: 10 mg via INTRAVENOUS

## 2021-07-10 MED ORDER — ROCURONIUM BROMIDE 10 MG/ML (PF) SYRINGE
PREFILLED_SYRINGE | INTRAVENOUS | Status: AC
  Filled 2021-07-10: qty 10

## 2021-07-10 MED ORDER — PROPOFOL 10 MG/ML IV BOLUS
INTRAVENOUS | Status: AC
  Filled 2021-07-10: qty 20

## 2021-07-10 MED ORDER — LACTATED RINGERS IV SOLN
INTRAVENOUS | Status: DC | PRN
  Administered 2021-07-10: 1000 mL via INTRAVENOUS

## 2021-07-10 MED ORDER — MIDAZOLAM HCL 2 MG/2ML IJ SOLN
INTRAMUSCULAR | Status: AC
  Filled 2021-07-10: qty 2

## 2021-07-10 MED ORDER — SUGAMMADEX SODIUM 200 MG/2ML IV SOLN
INTRAVENOUS | Status: DC | PRN
  Administered 2021-07-10: 200 mg via INTRAVENOUS

## 2021-07-10 MED ORDER — SUFENTANIL CITRATE 50 MCG/ML IV SOLN
INTRAVENOUS | Status: DC | PRN
  Administered 2021-07-10: 10 ug via INTRAVENOUS

## 2021-07-10 MED ORDER — 0.9 % SODIUM CHLORIDE (POUR BTL) OPTIME
TOPICAL | Status: DC | PRN
  Administered 2021-07-10: 1000 mL

## 2021-07-10 MED ORDER — CHLORHEXIDINE GLUCONATE 0.12 % MT SOLN: 15.0000 mL | Freq: Once | OROMUCOSAL | Status: DC

## 2021-07-10 MED ORDER — CEFAZOLIN SODIUM-DEXTROSE 2-4 GM/100ML-% IV SOLN
2.0000 g | Freq: Three times a day (TID) | INTRAVENOUS | Status: AC
  Administered 2021-07-10 – 2021-07-11 (×2): 2 g via INTRAVENOUS
  Filled 2021-07-10 (×3): qty 100

## 2021-07-10 MED ORDER — PHENYLEPHRINE HCL-NACL 20-0.9 MG/250ML-% IV SOLN
INTRAVENOUS | Status: DC | PRN
  Administered 2021-07-10: 100 ug/min via INTRAVENOUS

## 2021-07-10 MED ORDER — FENTANYL CITRATE (PF) 100 MCG/2ML IJ SOLN: 25.0000 ug | INTRAMUSCULAR | Status: DC | PRN

## 2021-07-10 MED ORDER — SODIUM CHLORIDE (PF) 0.9 % IJ SOLN
INTRAMUSCULAR | Status: AC
  Filled 2021-07-10: qty 10

## 2021-07-10 MED ORDER — SUFENTANIL CITRATE 50 MCG/ML IV SOLN
INTRAVENOUS | Status: AC
  Filled 2021-07-10: qty 1

## 2021-07-10 MED ORDER — LIDOCAINE 2% (20 MG/ML) 5 ML SYRINGE
INTRAMUSCULAR | Status: AC
  Filled 2021-07-10: qty 5

## 2021-07-10 MED ORDER — CEFAZOLIN SODIUM-DEXTROSE 2-4 GM/100ML-% IV SOLN
INTRAVENOUS | Status: AC
  Filled 2021-07-10: qty 100

## 2021-07-10 MED ORDER — CEFAZOLIN SODIUM-DEXTROSE 2-3 GM-%(50ML) IV SOLR
INTRAVENOUS | Status: DC | PRN
  Administered 2021-07-10: 2 g via INTRAVENOUS

## 2021-07-10 MED ORDER — APIXABAN 5 MG PO TABS
5.0000 mg | ORAL_TABLET | Freq: Two times a day (BID) | ORAL | Status: DC
  Administered 2021-07-11 – 2021-07-17 (×13): 5 mg via ORAL
  Filled 2021-07-10 (×13): qty 1

## 2021-07-10 MED ORDER — DEXAMETHASONE SODIUM PHOSPHATE 10 MG/ML IJ SOLN
INTRAMUSCULAR | Status: AC
  Filled 2021-07-10: qty 1

## 2021-07-10 MED ORDER — ROCURONIUM 10MG/ML (10ML) SYRINGE FOR MEDFUSION PUMP - OPTIME
INTRAVENOUS | Status: DC | PRN
  Administered 2021-07-10: 100 mg via INTRAVENOUS

## 2021-07-10 MED ORDER — PROPOFOL 10 MG/ML IV BOLUS
INTRAVENOUS | Status: DC | PRN
  Administered 2021-07-10: 70 mg via INTRAVENOUS
  Administered 2021-07-10: 130 mg via INTRAVENOUS

## 2021-07-10 MED ORDER — DROPERIDOL 2.5 MG/ML IJ SOLN: 0.6250 mg | Freq: Once | INTRAMUSCULAR | Status: DC | PRN

## 2021-07-10 MED ORDER — ONDANSETRON HCL 4 MG/2ML IJ SOLN
INTRAMUSCULAR | Status: DC | PRN
  Administered 2021-07-10: 4 mg via INTRAVENOUS

## 2021-07-10 MED ORDER — LACTATED RINGERS IV SOLN: INTRAVENOUS | Status: DC | PRN

## 2021-07-10 MED ORDER — ONDANSETRON HCL 4 MG/2ML IJ SOLN
INTRAMUSCULAR | Status: AC
  Filled 2021-07-10: qty 2

## 2021-07-10 MED ORDER — PHENYLEPHRINE 40 MCG/ML (10ML) SYRINGE FOR IV PUSH (FOR BLOOD PRESSURE SUPPORT)
PREFILLED_SYRINGE | INTRAVENOUS | Status: DC | PRN
  Administered 2021-07-10: 120 ug via INTRAVENOUS

## 2021-07-10 MED ORDER — PHENYLEPHRINE HCL-NACL 20-0.9 MG/250ML-% IV SOLN: INTRAVENOUS | Status: DC | PRN

## 2021-07-10 SURGICAL SUPPLY — 81 items
BAG COUNTER SPONGE SURGICOUNT (BAG) ×2 IMPLANT
BAG SPNG CNTER NS LX DISP (BAG) ×1
BIT DRILL 2.5X110 QC LCP DISP (BIT) ×1 IMPLANT
BIT DRILL 4.3 (BIT) ×2
BIT DRILL 4.3X300MM (BIT) IMPLANT
BIT DRILL LONG 3.3 (BIT) ×1 IMPLANT
BIT DRILL QC 3.3X195 (BIT) ×1 IMPLANT
BLADE CLIPPER SURG (BLADE) IMPLANT
BNDG ELASTIC 4X5.8 VLCR STR LF (GAUZE/BANDAGES/DRESSINGS) ×2 IMPLANT
BNDG ELASTIC 6X5.8 VLCR STR LF (GAUZE/BANDAGES/DRESSINGS) ×2 IMPLANT
BNDG GAUZE ELAST 4 BULKY (GAUZE/BANDAGES/DRESSINGS) ×2 IMPLANT
BRUSH SCRUB EZ PLAIN DRY (MISCELLANEOUS) ×4 IMPLANT
CANISTER SUCT 3000ML PPV (MISCELLANEOUS) ×2 IMPLANT
CAP LOCK NCB (Cap) ×6 IMPLANT
COVER SURGICAL LIGHT HANDLE (MISCELLANEOUS) ×2 IMPLANT
DRAPE C-ARM 42X72 X-RAY (DRAPES) ×2 IMPLANT
DRAPE C-ARMOR (DRAPES) ×2 IMPLANT
DRAPE IMP U-DRAPE 54X76 (DRAPES) ×2 IMPLANT
DRAPE ORTHO SPLIT 77X108 STRL (DRAPES) ×6
DRAPE SURG ORHT 6 SPLT 77X108 (DRAPES) ×3 IMPLANT
DRAPE U-SHAPE 47X51 STRL (DRAPES) ×2 IMPLANT
DRSG ADAPTIC 3X8 NADH LF (GAUZE/BANDAGES/DRESSINGS) ×2 IMPLANT
DRSG MEPILEX BORDER 4X8 (GAUZE/BANDAGES/DRESSINGS) ×1 IMPLANT
DRSG PAD ABDOMINAL 8X10 ST (GAUZE/BANDAGES/DRESSINGS) ×8 IMPLANT
ELECT REM PT RETURN 9FT ADLT (ELECTROSURGICAL) ×2
ELECTRODE REM PT RTRN 9FT ADLT (ELECTROSURGICAL) ×1 IMPLANT
EVACUATOR 1/8 PVC DRAIN (DRAIN) IMPLANT
EVACUATOR 3/16  PVC DRAIN (DRAIN)
EVACUATOR 3/16 PVC DRAIN (DRAIN) IMPLANT
GAUZE SPONGE 4X4 12PLY STRL (GAUZE/BANDAGES/DRESSINGS) ×2 IMPLANT
GLOVE SRG 8 PF TXTR STRL LF DI (GLOVE) ×1 IMPLANT
GLOVE SURG ENC MOIS LTX SZ7.5 (GLOVE) ×2 IMPLANT
GLOVE SURG ENC MOIS LTX SZ8 (GLOVE) ×2 IMPLANT
GLOVE SURG UNDER POLY LF SZ7.5 (GLOVE) ×2 IMPLANT
GLOVE SURG UNDER POLY LF SZ8 (GLOVE) ×2
GOWN STRL REUS W/ TWL LRG LVL3 (GOWN DISPOSABLE) ×2 IMPLANT
GOWN STRL REUS W/ TWL XL LVL3 (GOWN DISPOSABLE) ×1 IMPLANT
GOWN STRL REUS W/TWL LRG LVL3 (GOWN DISPOSABLE) ×4
GOWN STRL REUS W/TWL XL LVL3 (GOWN DISPOSABLE) ×2
K-WIRE 2.0 (WIRE) ×2
K-WIRE FXSTD 280X2XNS SS (WIRE) ×1
KIT BASIN OR (CUSTOM PROCEDURE TRAY) ×2 IMPLANT
KIT TURNOVER KIT B (KITS) ×2 IMPLANT
KWIRE FXSTD 280X2XNS SS (WIRE) IMPLANT
NEEDLE 22X1 1/2 (OR ONLY) (NEEDLE) IMPLANT
NS IRRIG 1000ML POUR BTL (IV SOLUTION) ×2 IMPLANT
PACK TOTAL JOINT (CUSTOM PROCEDURE TRAY) ×2 IMPLANT
PACK UNIVERSAL I (CUSTOM PROCEDURE TRAY) ×2 IMPLANT
PAD ARMBOARD 7.5X6 YLW CONV (MISCELLANEOUS) ×4 IMPLANT
PAD CAST 4YDX4 CTTN HI CHSV (CAST SUPPLIES) ×1 IMPLANT
PADDING CAST COTTON 4X4 STRL (CAST SUPPLIES) ×2
PADDING CAST COTTON 6X4 STRL (CAST SUPPLIES) ×2 IMPLANT
PLATE LCP RECON 3.5 6H/84 (Plate) ×1 IMPLANT
PLATE NCB PPP 9H (Plate) ×1 IMPLANT
SCREW 5.0 80MM (Screw) ×4 IMPLANT
SCREW CORTEX 3.5 36MM (Screw) ×1 IMPLANT
SCREW CORTEX 3.5 38MM (Screw) ×1 IMPLANT
SCREW CORTICAL NCB 5.0X40 (Screw) ×1 IMPLANT
SCREW LOCK CORT ST 3.5X36 (Screw) IMPLANT
SCREW LOCK CORT ST 3.5X38 (Screw) IMPLANT
SCREW NCB 4.0MX34M (Screw) ×1 IMPLANT
SCREW NCB 5.0X34MM (Screw) ×1 IMPLANT
SCREW NCB 5.0X36MM (Screw) ×1 IMPLANT
SCREW NCB 5.0X85MM (Screw) ×2 IMPLANT
SPONGE T-LAP 18X18 ~~LOC~~+RFID (SPONGE) ×2 IMPLANT
STAPLER VISISTAT 35W (STAPLE) ×2 IMPLANT
SUCTION FRAZIER HANDLE 10FR (MISCELLANEOUS) ×2
SUCTION TUBE FRAZIER 10FR DISP (MISCELLANEOUS) ×1 IMPLANT
SUT ETHILON 2 0 FS 18 (SUTURE) ×2 IMPLANT
SUT PROLENE 0 CT 2 (SUTURE) ×2 IMPLANT
SUT VIC AB 0 CT1 27 (SUTURE) ×4
SUT VIC AB 0 CT1 27XBRD ANBCTR (SUTURE) ×2 IMPLANT
SUT VIC AB 1 CT1 27 (SUTURE) ×6
SUT VIC AB 1 CT1 27XBRD ANBCTR (SUTURE) ×2 IMPLANT
SUT VIC AB 2-0 CT1 27 (SUTURE) ×4
SUT VIC AB 2-0 CT1 TAPERPNT 27 (SUTURE) ×2 IMPLANT
SYR 20ML ECCENTRIC (SYRINGE) IMPLANT
TOWEL GREEN STERILE (TOWEL DISPOSABLE) ×4 IMPLANT
TOWEL GREEN STERILE FF (TOWEL DISPOSABLE) ×2 IMPLANT
TRAY FOLEY MTR SLVR 16FR STAT (SET/KITS/TRAYS/PACK) IMPLANT
WATER STERILE IRR 1000ML POUR (IV SOLUTION) ×4 IMPLANT

## 2021-07-10 NOTE — Progress Notes (Signed)
Subjective: I seen and evaluated Ms. Goetze at bedside.  Upon entering the room Ms. Linnehan was sleeping. She was easily aroused by calling her name.  She was alert and aware that she had a surgery this morning. She denies any pain at this current time.   Objective:  Vital signs in last 24 hours: Vitals:   07/08/21 2219 07/09/21 0753 07/09/21 1449 07/09/21 2118  BP: 119/64 (!) 101/58 (!) 94/53 102/66  Pulse: 83 81 78 80  Resp: '18 18 18 17  '$ Temp: 99.1 F (37.3 C) 98.4 F (36.9 C) 98.7 F (37.1 C) 98.8 F (37.1 C)  TempSrc: Oral Oral Oral Oral  SpO2: 100% 91% 95% 94%  Weight:      Height:       Physical Exam Constitutional:      General: She is awake.  HENT:     Head: Normocephalic and atraumatic.  Eyes:     General: Lids are normal.  Cardiovascular:     Rate and Rhythm: Normal rate.     Heart sounds: Normal heart sounds.  Pulmonary:     Effort: Pulmonary effort is normal.     Breath sounds: Normal breath sounds.  Musculoskeletal:     Right lower leg: No edema.     Left lower leg: No edema.     Comments: Right leg wrapped in bandages.  Feet:     Comments: Warm pink toes. Skin:    General: Skin is warm and dry.  Neurological:     General: No focal deficit present.     Mental Status: She is alert, oriented to person, place, and time and easily aroused.  Psychiatric:        Behavior: Behavior normal. Behavior is cooperative.     Assessment/Plan:  Active Problems:   Femur fracture, right [facility])  Ms. Jontue Lucido is a 75 y/o female with a PMHx of T2DM, HTN, A. Fib, osteoporosis who is currently admitted for a distal femur fracture secondary to mechanical fall.    # Distal Femur Fracture, right # Osteoporosis  X-ray of the knee reveals oblique fracture through the distal femur extending into the intercondylar notch.  CT with contrast of the knee reveals same findings with the addition of moderate sized joint effusion. ORIF surgery was performed today. Patient  is interested in going to a nursing facility after surgery and discharge. TOC for SNF placement was consulted yesterday. --Depending on how patient tolerated surgery, will consider resuming Eliquis tomorrow - Tylenol, Norco, and Dilaudid for pain control post op -- PT/OT post-op ordered today   # Rib Pain, left, Resolved No fractures noted on CXR. Pain elicited with palpation of area under left breast. Likely costochondritis versus contusion.  Patient reports no pain.  # Atrial Fibrillation  - Amiodarone 200 mg twice daily - Consider resuming Eliquis tomorrow since surgery was performed today.   # Type 2 Diabetes  Currently managed with diet only.  Patient states she was previously on insulin but was taken off due to hypoglycemia in March 2022.  She checks her sugars at home twice daily; CBGs range between 90-120. A1c was 5.4% in July 2022. Last CBG obtained prior to surgery 0700 was 139.  Resume sliding scale once patient begins eating. - SSI, moderate scale   # Anxiety  Depression  - Restart home fluoxetine  Prior to Admission Living Arrangement: Anticipated Discharge Location: Barriers to Discharge: Dispo: Anticipated discharge in approximately 1-2 day(s).   Timothy Lasso, MD 07/10/2021, 6:18 AM  Pager: [phone] After 5pm on weekdays and 1pm on weekends: On Call pager [phone]

## 2021-07-10 NOTE — Progress Notes (Signed)
Orthopedic Tech Progress Note Patient Details:  [name] [name] [birth year] [id number]  Ortho Devices Type of Ortho Device: Bone foam zero knee Ortho Device/Splint Location: Left Leg Ortho Device/Splint Interventions: Application   Post Interventions Patient Tolerated: Well  [name] [name] 07/10/2021, 3:27 PM

## 2021-07-10 NOTE — Progress Notes (Signed)
No changes overnight.  Bowel incontinence this am.  I discussed with the patient the risks and benefits of surgery for her right knee, including the possibility of infection, nerve injury, vessel injury, wound breakdown, exacerbation of her already present knee arthritis, symptomatic hardware, DVT/ PE, loss of motion, malunion, nonunion, and need for further surgery among others.  She acknowledged these risks and wished to proceed.  Altamese [place], MD [facility], [facility] [phone]

## 2021-07-10 NOTE — Transfer of Care (Signed)
Immediate Anesthesia Transfer of Care Note  Patient: [name] [name]  Procedure(s) Performed: OPEN REDUCTION INTERNAL FIXATION (ORIF) DISTAL FEMUR FRACTURE (Right)  Patient Location: PACU  Anesthesia Type:General  Level of Consciousness: sedated, drowsy, patient cooperative and responds to stimulation  Airway & Oxygen Therapy: Patient Spontanous Breathing and Patient connected to nasal cannula oxygen  Post-op Assessment: Report given to RN, Post -op Vital signs reviewed and stable and Patient moving all extremities X 4  Post vital signs: Reviewed and stable  Last Vitals:  Vitals Value Taken Time  BP 132/84 07/10/21 1205  Temp    Pulse 90 07/10/21 1207  Resp 21 07/10/21 1207  SpO2 99 % 07/10/21 1207  Vitals shown include unvalidated device data.  Last Pain:  Vitals:   07/10/21 0736  TempSrc:   PainSc: 0-No pain         Complications: No notable events documented.

## 2021-07-10 NOTE — Anesthesia Postprocedure Evaluation (Signed)
Anesthesia Post Note  Patient: [name] [name]  Procedure(s) Performed: OPEN REDUCTION INTERNAL FIXATION (ORIF) DISTAL FEMUR FRACTURE (Right)     Patient location during evaluation: PACU Anesthesia Type: General Level of consciousness: awake and alert and oriented Pain management: pain level controlled Vital Signs Assessment: post-procedure vital signs reviewed and stable Respiratory status: spontaneous breathing, nonlabored ventilation and respiratory function stable Cardiovascular status: blood pressure returned to baseline Postop Assessment: no apparent nausea or vomiting Anesthetic complications: no   No notable events documented.  Last Vitals:  Vitals:   07/10/21 1249 07/10/21 1327  BP:  121/65  Pulse: 98   Resp:    Temp:  36.7 C  SpO2: 94% 96%    Last Pain:  Vitals:   07/10/21 1327  TempSrc: Oral  PainSc: 0-No pain                 Brennan Bailey

## 2021-07-10 NOTE — Anesthesia Procedure Notes (Signed)
Procedure Name: Intubation Date/Time: 07/10/2021 8:36 AM Performed by: Claris Che, CRNA Pre-anesthesia Checklist: Patient identified, Emergency Drugs available, Suction available, Patient being monitored and Timeout performed Patient Re-evaluated:Patient Re-evaluated prior to induction Oxygen Delivery Method: Circle system utilized Preoxygenation: Pre-oxygenation with 100% oxygen Induction Type: Cricoid Pressure applied Ventilation: Mask ventilation without difficulty Laryngoscope Size: Mac and 4 Grade View: Grade II Tube type: Oral Tube size: 7.5 mm Number of attempts: 1 Airway Equipment and Method: Stylet Placement Confirmation: ETT inserted through vocal cords under direct vision, positive ETCO2 and breath sounds checked- equal and bilateral Secured at: 22 cm Tube secured with: Tape Dental Injury: Teeth and Oropharynx as per pre-operative assessment

## 2021-07-11 LAB — CBC WITH DIFFERENTIAL/PLATELET
Abs Immature Granulocytes: 0.05 10*3/uL (ref 0.00–0.07)
Basophils Absolute: 0 10*3/uL (ref 0.0–0.1)
Basophils Relative: 0 %
Eosinophils Absolute: 0 10*3/uL (ref 0.0–0.5)
Eosinophils Relative: 0 %
HCT: 26.8 % — ABNORMAL LOW (ref 36.0–46.0)
Hemoglobin: 8.8 g/dL — ABNORMAL LOW (ref 12.0–15.0)
Immature Granulocytes: 1 %
Lymphocytes Relative: 6 %
Lymphs Abs: 0.6 10*3/uL — ABNORMAL LOW (ref 0.7–4.0)
MCH: 30.7 pg (ref 26.0–34.0)
MCHC: 32.8 g/dL (ref 30.0–36.0)
MCV: 93.4 fL (ref 80.0–100.0)
Monocytes Absolute: 0.9 10*3/uL (ref 0.1–1.0)
Monocytes Relative: 8 %
Neutro Abs: 8.8 10*3/uL — ABNORMAL HIGH (ref 1.7–7.7)
Neutrophils Relative %: 85 %
Platelets: 250 10*3/uL (ref 150–400)
RBC: 2.87 MIL/uL — ABNORMAL LOW (ref 3.87–5.11)
RDW: 14.6 % (ref 11.5–15.5)
WBC: 10.4 10*3/uL (ref 4.0–10.5)
nRBC: 0.2 % (ref 0.0–0.2)

## 2021-07-11 LAB — GLUCOSE, CAPILLARY
Glucose-Capillary: 166 mg/dL — ABNORMAL HIGH (ref 70–99)
Glucose-Capillary: 170 mg/dL — ABNORMAL HIGH (ref 70–99)
Glucose-Capillary: 188 mg/dL — ABNORMAL HIGH (ref 70–99)
Glucose-Capillary: 149 mg/dL — ABNORMAL HIGH (ref 70–99)

## 2021-07-11 LAB — BASIC METABOLIC PANEL
Anion gap: 10 (ref 5–15)
BUN: 22 mg/dL (ref 8–23)
CO2: 22 mmol/L (ref 22–32)
Calcium: 8.5 mg/dL — ABNORMAL LOW (ref 8.9–10.3)
Chloride: 99 mmol/L (ref 98–111)
Creatinine, Ser: 0.75 mg/dL (ref 0.44–1.00)
GFR, Estimated: >60 mL/min (ref >60)
Glucose, Bld: 163 mg/dL — ABNORMAL HIGH (ref 70–99)
Potassium: 4.4 mmol/L (ref 3.5–5.1)
Sodium: 131 mmol/L — ABNORMAL LOW (ref 135–145)

## 2021-07-11 MED ORDER — ALPRAZOLAM 0.25 MG PO TABS
0.2500 mg | ORAL_TABLET | Freq: Two times a day (BID) | ORAL | Status: DC | PRN
  Administered 2021-07-13 – 2021-07-16 (×4): 0.25 mg via ORAL
  Filled 2021-07-11 (×5): qty 1

## 2021-07-11 MED ORDER — TRAMADOL HCL 50 MG PO TABS
50.0000 mg | ORAL_TABLET | Freq: Four times a day (QID) | ORAL | Status: DC
Start: 2021-07-11 — End: 2021-07-17
  Administered 2021-07-11 – 2021-07-17 (×24): 50 mg via ORAL
  Filled 2021-07-11 (×24): qty 1

## 2021-07-11 MED ORDER — ACETAMINOPHEN 500 MG PO TABS
1000.0000 mg | ORAL_TABLET | Freq: Two times a day (BID) | ORAL | Status: DC
  Administered 2021-07-11: 1000 mg via ORAL
  Filled 2021-07-11 (×2): qty 2

## 2021-07-11 MED ORDER — SODIUM CHLORIDE 0.9 % IV SOLN: Freq: Once | INTRAVENOUS | Status: AC

## 2021-07-11 NOTE — Progress Notes (Signed)
Subjective: I saw and evaluated [name] [name] at bedside.  She was accompanied by her [name] [name]. [name] [name] was lying comfortably in bed, she was alert alert and oriented.  She states that she is currently not in any pain.  She states she was unable to sleep well last night due to some discomfort in her right leg. She also reports having an anxiety attack last night, that went on for a few hours, deep breaths helped her calm down. The nurse did not give her, her xanax due to possible respiratory depression since she is also on hydrocodone for pain relief.  [name] [name] also reports an episode of hallucination upon waking up this morning. She states she saw something flying in the room for about a few minutes. She says is difficult to move her right leg and that it feels heavy.  I explained to her that postop your leg may feel uncomfortable and difficult to move right now due to the surgery.  She acknowledges and understands.  She is able to wiggle her toes and sensation intact.  She denies headache, chest pain, shortness of shortness of breath, or abdominal pain.  She reports having a bout of diarrhea last night.  Denies any diarrhea since then.  Objective:  Vital signs in last 24 hours: Vitals:   07/10/21 2031 07/11/21 0010 07/11/21 0202 07/11/21 0507  BP: 111/69 120/71 120/73 125/77  Pulse: 94  91 91  Resp: '16 14 14   '$ Temp: 99.7 F (37.6 C) 98.1 F (36.7 C) 98.6 F (37 C) 97.7 F (36.5 C)  TempSrc: Oral Oral  Axillary  SpO2: 91% 94% 94% 93%  Weight:      Height:       Physical Exam Constitutional:      General: She is awake.  HENT:     Head: Normocephalic and atraumatic.  Cardiovascular:     Rate and Rhythm: Normal rate.     Pulses:          Radial pulses are 2+ on the right side and 2+ on the left side.     Heart sounds: Normal heart sounds.  Pulmonary:     Effort: Pulmonary effort is normal.     Breath sounds: Normal breath sounds.  Abdominal:     General: Bowel sounds are  normal.     Palpations: Abdomen is soft.  Musculoskeletal:     Left lower leg: No edema.     Comments: Right leg wrapped in bandage and knee brace present  Lymphadenopathy:     Head:     Right side of head: No submandibular adenopathy.     Left side of head: No submandibular adenopathy.     Cervical:     Right cervical: No superficial cervical adenopathy.    Left cervical: No superficial cervical adenopathy.  Skin:    General: Skin is warm and dry.  Neurological:     General: No focal deficit present.     Mental Status: She is alert and oriented to person, place, and time.  Psychiatric:        Attention and Perception: Attention normal.        Speech: Speech normal.        Behavior: Behavior normal. Behavior is cooperative.     Assessment/Plan:  Active Problems:   Femur fracture, right (HCC)  # Distal Femur Fracture, right # Osteoporosis  X-ray of the knee reveals oblique fracture through the distal femur extending into the intercondylar notch.  CT with contrast of the knee reveals same findings with the addition of moderate sized joint effusion. ORIF surgery was performed yesterday. Patient is interested in going to a nursing facility after surgery and discharge.  ---TOC for SNF  --- Eliquis resumed, 5 mg twice daily -- Norco and Dilaudid discontinued due to patient complaining of hallucinations. --- Tylenol 1000 mg every 12 hours for pain control --- Tramadol 50 mg every 6 hours for pain control -- PT/OT on board   # Rib Pain, left, Resolved No fractures noted on CXR. Pain elicited with palpation of area under left breast. Likely costochondritis versus contusion.  Patient reports no pain.   # Atrial Fibrillation  - Amiodarone 200 mg twice daily - Eliquis resumed, 5 mg twice daily.   # Type 2 Diabetes  Currently managed with diet only. Patient states she was previously on insulin but was taken off due to hypoglycemia in March 2022.  She checks her sugars at home twice  daily; CBGs range between 90-120. A1c was 5.4% in July 2022.  Current CBGs ranging from 170--190.  If glucose range goes above 200, consider adjusting sliding scale. - SSI, moderate scale   # Anxiety  Depression  - Restart home fluoxetine --- Alprazolam 0.25 twice daily as needed; opioids have been discontinued, so this medication can be given  Prior to Admission Living Arrangement: Anticipated Discharge Location: Barriers to Discharge: Dispo: Anticipated discharge in approximately 1-2 day(s).   Timothy Lasso, MD 07/11/2021, 7:38 AM Pager: [phone] After 5pm on weekdays and 1pm on weekends: On Call pager [phone]

## 2021-07-11 NOTE — Progress Notes (Signed)
Occupational Therapy Evaluation Patient Details Name: [name] [name] MRN: [id number] DOB: Aug 16, 1946 Today's Date: 07/11/2021    History of Present Illness 75 y.o. female admitted on 07/08/21 with R obliquely oriented intra-articular distal femur fracture, extending from the medial metadiaphysis through the intercondylar notch into the knee joint s/p fall. Pt. reports multiple falls over last 8 months due to weakness. CT head and C spine (-) for any acute findings. Underwent R distal femur ORIF on 07/10/21. PMH  type 2 diabetes, hypertension, anxiety, anemia, obesity (BMI 37.76), GERD, sleep apnea, colon CA, a-fib.  PLOF is mod IND with use of RW for household distances, assist with ADLs from sister.   Clinical Impression   [name] [name] was evaluated s/p the above R femur ORIF. PTA pt was generally mod I with use of a RW and sponge baths, her sister assists as needed. Upon arrival, pt was crying with 10/10 pain in her RLE, RN notified and pain meds given. Eval was completed bed level as this was only what the pt could tolerate, max A +2 for rolling and boosting in bed. Pt is currently max-total A +2 for all lower body ADLs, and set up - minA for upper ADLs. Pt benefits from continued OT acutely with +2 assist. Recommend d/c to SNF.     Follow Up Recommendations  SNF;Supervision/Assistance - 24 hour    Equipment Recommendations  Other (comment) (Defer to next venue)       Precautions / Restrictions Precautions Precautions: Fall Precaution Comments: Rest with knee in extension only with knee immobilizer on and bone foam in place, okay to remove immobilizer for PT.  Unrestricted ROM for PT. Required Braces or Orthoses: Knee Immobilizer - Right Restrictions Weight Bearing Restrictions: Yes RLE Weight Bearing: Non weight bearing      Mobility Bed Mobility Overal bed mobility: Needs Assistance Bed Mobility: Rolling Rolling: Max assist;+2 for physical assistance;+2 for safety/equipment          General bed mobility comments: Pt only tolerated minimal bed mobility this session due to pain and anxiety, Max A +2 to roll and adjut pt in bed    Transfers Overall transfer level: Needs assistance               General transfer comment: defer this session    Balance Overall balance assessment: Needs assistance             ADL either performed or assessed with clinical judgement   ADL Overall ADL's : Needs assistance/impaired Eating/Feeding: Set up;Sitting   Grooming: Set up;Sitting   Upper Body Bathing: Moderate assistance;Sitting   Lower Body Bathing: Maximal assistance;+2 for physical assistance;+2 for safety/equipment;Bed level   Upper Body Dressing : Minimal assistance;Sitting   Lower Body Dressing: Maximal assistance;+2 for physical assistance;+2 for safety/equipment;Bed level   Toilet Transfer: +2 for physical assistance;+2 for safety/equipment;Stand-pivot;BSC;Total assistance   Toileting- Clothing Manipulation and Hygiene: Maximal assistance;+2 for physical assistance;+2 for safety/equipment;Sit to/from stand;Bed level       Functional mobility during ADLs: Maximal assistance;+2 for physical assistance;+2 for safety/equipment;Rolling walker General ADL Comments: Pt is limited by pain, immobility and generalized weakness     Vision Patient Visual Report: No change from baseline Vision Assessment?: No apparent visual deficits            Pertinent Vitals/Pain Pain Assessment: 0-10 Pain Score: 8  Pain Location: 8/10 pain at rest, 10/10 with any movement Pain Descriptors / Indicators: Discomfort;Grimacing;Guarding Pain Intervention(s): Monitored during session     Hand Dominance  Extremity/Trunk Assessment Upper Extremity Assessment Upper Extremity Assessment: Generalized weakness RUE Deficits / Details: Decreased shoulder ROM   Lower Extremity Assessment Lower Extremity Assessment: Defer to PT evaluation   Cervical / Trunk  Assessment Cervical / Trunk Assessment: Normal (incrased body habitus)   Communication Communication Communication: No difficulties   Cognition Arousal/Alertness: Awake/alert Behavior During Therapy: Anxious Overall Cognitive Status: Within Functional Limits for tasks assessed       General Comments: Pt crying and hysterical upon arrival with c/o pain and being thirsty   General Comments  Pt in bone foam upon arrival reporting 10/10 pain, RN gave meds during session, removed from bone foam and place yellow foam under foot to encourage extension. RN made aware to place pt back into bon foam this afteroon            Home Living Family/patient expects to be discharged to:: Private residence Living Arrangements: Alone Available Help at Discharge: Family Type of Home: House Home Access: [place]: One level     Bathroom Shower/Tub: Tub/shower unit (pt does not get into shower, sponge bathes only)   Bathroom Toilet: Standard (pt does not use her toliet, BSC only) Bathroom Accessibility: Yes How Accessible: Accessible via walker Home Equipment: Walker - 2 wheels          Prior Functioning/Environment Level of Independence: Needs assistance  Gait / Transfers Assistance Needed: Use of RW ADL's / Homemaking Assistance Needed: Sister assists with ADLs Communication / Swallowing Assistance Needed: WFL Comments: pt does not drive, sister helps as she is able, multiple falls        OT Problem List: Decreased strength;Decreased range of motion;Decreased activity tolerance;Impaired balance (sitting and/or standing);Decreased safety awareness;Decreased knowledge of use of DME or AE;Decreased knowledge of precautions;Pain      OT Treatment/Interventions: Self-care/ADL training;Therapeutic exercise;Therapeutic activities;Balance training;Patient/family education;DME and/or AE instruction    OT Goals(Current goals can be found in the care plan section) Acute  Rehab OT Goals Patient Stated Goal: less pain OT Goal Formulation: With patient Time For Goal Achievement: 07/25/21 Potential to Achieve Goals: Fair ADL Goals Pt Will Perform Upper Body Bathing: with set-up;sitting Pt Will Perform Lower Body Bathing: with min assist;sit to/from stand Pt Will Perform Lower Body Dressing: with min assist;sit to/from stand Pt Will Transfer to Toilet: with min guard assist;stand pivot transfer;bedside commode Pt Will Perform Toileting - Clothing Manipulation and hygiene: with min assist;sit to/from stand  OT Frequency: Min 2X/week   Barriers to D/C: Decreased caregiver support  pt lives alone          AM-PAC OT "6 Clicks" Daily Activity     Outcome Measure Help from another person eating meals?: A Little Help from another person taking care of personal grooming?: A Little Help from another person toileting, which includes using toliet, bedpan, or urinal?: A Lot Help from another person bathing (including washing, rinsing, drying)?: A Lot Help from another person to put on and taking off regular upper body clothing?: A Little Help from another person to put on and taking off regular lower body clothing?: Total 6 Click Score: 14   End of Session Equipment Utilized During Treatment: Oxygen Nurse Communication: Mobility status;Precautions;Weight bearing status  Activity Tolerance: Patient limited by pain Patient left: in bed;with call bell/phone within reach;with bed alarm set  OT Visit Diagnosis: Other abnormalities of gait and mobility (R26.89);Unsteadiness on feet (R26.81);Repeated falls (R29.6);Muscle weakness (generalized) (M62.81);Pain  Time: [id number] OT Time Calculation (min): 22 min Charges:  OT General Charges $OT Visit: 1 Visit OT Evaluation $OT Eval Moderate Complexity: 1 Mod  Oliverio Cho A Shemeka Wardle 07/11/2021, 4:10 PM

## 2021-07-11 NOTE — Evaluation (Signed)
Physical Therapy Evaluation Patient Details Name: [name] [name] MRN: [id number] DOB: [birth year] Today's Date: 07/11/2021   History of Present Illness  75 y.o. female admitted on 07/08/21 with R obliquely oriented intra-articular distal femur fracture, extending from the medial metadiaphysis through the intercondylar notch into the knee joint s/p fall. Pt. reports multiple falls over last 8 months due to weakness. CT head and C spine (-) for any acute findings. Underwent R distal femur ORIF on 07/10/21. PMH  type 2 diabetes, hypertension, anxiety, anemia, obesity (BMI 37.76), GERD, sleep apnea, colon CA, a-fib.  PLOF is mod IND with use of RW for household distances, assist with ADLs from sister.  Clinical Impression  Pt. Was previously mod IND with use of RW, but has been having multiple falls due to generalized weakness over last couple of months.  Pt. Currently req's max A for bed mobility and is unable to complete sit > stand while maintaining precautions with max A x 2.  Pt. Will require skilled PT in acute care to address balance deficits, bed mobility difficulty, LE weakness, and transfer difficulty.  Pt. Is unsafe to return home and will need SNF at D/C for safety.  PT to trial sara stedy or other lift equipment with pt. Next tx to help promote OOB.    Follow Up Recommendations SNF    Equipment Recommendations  Wheelchair (measurements PT) (May need w/c after D/C from SNF depending on functional level at that time.)    Recommendations for Other Services       Precautions / Restrictions Precautions Precaution Comments: Rest with knee in extension only with knee immobilizer on and bone foam in place, okay to remove immobilizer for PT.  Unrestricted ROM for PT. Required Braces or Orthoses: Knee Immobilizer - Right Restrictions Weight Bearing Restrictions: Yes RLE Weight Bearing: Non weight bearing      Mobility  Bed Mobility Overal bed mobility: Needs Assistance Bed Mobility:  Supine to Sit;Sit to Supine     Supine to sit: Mod assist Sit to supine: Max assist   General bed mobility comments: Pt. req's mod A for LE negotiation to EOB, max A with use of chuck pad to scoot pelvis.  Req's mod A for trunk support and HH support to complete sup > sit.  Demos poor sitting balance req'ing max A to prevent R sided LOB.  Balance improves once PT uses chuck pad and max A to scoot pelvis to position where feet are supported on floor.  Continues to demo R sided lean in sitting.  Pt. returns BTB with max A for B LE negotiation and trunk support.  Use of chuck pad with 2 person assist to boost pt. in bed. Patient Response: Anxious;Cooperative  Transfers Overall transfer level: Needs assistance   Transfers: Sit to/from Stand Sit to Stand: Total assist;+2 physical assistance         General transfer comment: Attempt sit > stand x 3 from elevated bed height.  Pt. screams out with activity, states she is fearful of falling.  Demos difficulty maintaining NWB on R LE.  Sister assists by helping to hold LE off floor while PT stands in front blocking L knee with use of gait belt.  Pt. unable to even perform partial standing.  Will need to trial lift equipment next tx session.  Ambulation/Gait                Science writer  Modified Rankin (Stroke Patients Only)       Balance Overall balance assessment: Needs assistance Sitting-balance support: Feet supported;Bilateral upper extremity supported Sitting balance-Leahy Scale: Poor   Postural control: Right lateral lean                                   Pertinent Vitals/Pain Pain Assessment: 0-10 Pain Score: 4  Pain Location: Pt. reports 4/10 R LE pain with movement. Pain Descriptors / Indicators: Heaviness;Aching Pain Intervention(s): Limited activity within patient's tolerance    Home Living Family/patient expects to be discharged to:: Private residence Living  Arrangements: Alone Available Help at Discharge: Family Type of Home: House Home Access: Ramped entrance     Home Layout: One level Home Equipment: Environmental consultant - 2 wheels      Prior Function Level of Independence: Needs assistance      ADL's / Homemaking Assistance Needed: Sister assists with ADLs        Hand Dominance        Extremity/Trunk Assessment   Upper Extremity Assessment Upper Extremity Assessment: Defer to OT evaluation;RUE deficits/detail RUE Deficits / Details: Decreased shoulder ROM    Lower Extremity Assessment Lower Extremity Assessment: LLE deficits/detail;RLE deficits/detail RLE Deficits / Details: Grossly 2-/5 LLE Deficits / Details: Grossly 2/5       Communication   Communication: No difficulties  Cognition Arousal/Alertness: Awake/alert Behavior During Therapy: Anxious Overall Cognitive Status: Within Functional Limits for tasks assessed                                 General Comments: Highly anxious about falling      General Comments General comments (skin integrity, edema, etc.): Pt. does not have bone foam in place when PT arrives.  Pt. and sister educated on resting with foam in place to improve extension.  Demo understanding.  Also educated that knee immobilizer should be in place except for when working with PT.    Exercises     Assessment/Plan    PT Assessment Patient needs continued PT services  PT Problem List Decreased strength;Decreased mobility;Decreased range of motion;Decreased activity tolerance;Decreased balance;Pain       PT Treatment Interventions DME instruction;Therapeutic exercise;Gait training;Balance training;Stair training;Functional mobility training;Therapeutic activities;Patient/family education    PT Goals (Current goals can be found in the Care Plan section)  Acute Rehab PT Goals Patient Stated Goal: Pt's goal is to be able to walk again. PT Goal Formulation: With patient/family Time For  Goal Achievement: 07/25/21 Potential to Achieve Goals: Fair    Frequency Min 3X/week   Barriers to discharge Decreased caregiver support Pt. lives alone and does not have necessary support required to safely return home at this time.    Co-evaluation               AM-PAC PT "6 Clicks" Mobility  Outcome Measure Help needed turning from your back to your side while in a flat bed without using bedrails?: A Lot Help needed moving from lying on your back to sitting on the side of a flat bed without using bedrails?: A Lot Help needed moving to and from a bed to a chair (including a wheelchair)?: Total Help needed standing up from a chair using your arms (e.g., wheelchair or bedside chair)?: Total Help needed to walk in hospital room?: Total Help needed climbing 3-5 steps with a railing? :  Total 6 Click Score: 8    End of Session Equipment Utilized During Treatment: Gait belt;Right knee immobilizer Activity Tolerance: Patient limited by pain Patient left: in bed;with bed alarm set;with call bell/phone within reach   PT Visit Diagnosis: Repeated falls (R29.6);Muscle weakness (generalized) (M62.81);Other abnormalities of gait and mobility (R26.89)    Time: [id number] PT Time Calculation (min) (ACUTE ONLY): 35 min   Charges:     PT Treatments $Therapeutic Activity: 8-22 mins        Duffy Dantonio A. Iretha Kirley, PT, DPT Acute Rehabilitation Services Office: [place] 07/11/2021, 11:52 AM

## 2021-07-11 NOTE — Progress Notes (Signed)
Subjective: 1 Day Post-Op Procedure(s) (LRB): OPEN REDUCTION INTERNAL FIXATION (ORIF) DISTAL FEMUR FRACTURE (Right) Patient reports pain as mild.  Pt did report uses walker at baseline at home.  Objective: Vital signs in last 24 hours: Temp:  [97.6 F (36.4 C)-99.7 F (37.6 C)] 98 F (36.7 C) (08/13 1233) Pulse Rate:  [76-94] 94 (08/13 1233) Resp:  [14-19] 18 (08/13 1233) BP: (111-137)/(59-77) 122/59 (08/13 1233) SpO2:  [91 %-97 %] 97 % (08/13 1233)  Intake/Output from previous day: 08/12 0701 - 08/13 0700 In: 1540.8 [P.O.:240; I.V.:1200; IV Piggyback:100.8] Out: 250 [Urine:50; Blood:200] Intake/Output this shift: No intake/output data recorded.  Recent Labs    07/11/21 0317  HGB 8.8*   Recent Labs    07/11/21 0317  WBC 10.4  RBC 2.87*  HCT 26.8*  PLT 250   Recent Labs    07/11/21 0317  NA 131*  K 4.4  CL 99  CO2 22  BUN 22  CREATININE 0.75  GLUCOSE 163*  CALCIUM 8.5*   No results for input(s): LABPT, INR in the last 72 hours.  Neurovascular intact Sensation intact distally Intact pulses distally Incision: dressing C/D/I   Assessment/Plan: 1 Day Post-Op Procedure(s) (LRB): OPEN REDUCTION INTERNAL FIXATION (ORIF) DISTAL FEMUR FRACTURE (Right) Up with therapy PT/OT NWB RLE No ROM restrictions Pain control as ordered Eliquis dvt proph Dsg change prn Likely d/c to SNF per primary team    [facility] 07/11/2021, 1:12 PM

## 2021-07-12 DIAGNOSIS — I4891 Unspecified atrial fibrillation: Secondary | ICD-10-CM | POA: Diagnosis not present

## 2021-07-12 DIAGNOSIS — E119 Type 2 diabetes mellitus without complications: Secondary | ICD-10-CM | POA: Diagnosis not present

## 2021-07-12 LAB — CBC WITH DIFFERENTIAL/PLATELET
Abs Immature Granulocytes: 0.02 10*3/uL (ref 0.00–0.07)
Basophils Absolute: 0 10*3/uL (ref 0.0–0.1)
Basophils Relative: 0 %
Eosinophils Absolute: 0 10*3/uL (ref 0.0–0.5)
Eosinophils Relative: 1 %
HCT: 24.5 % — ABNORMAL LOW (ref 36.0–46.0)
Hemoglobin: 8 g/dL — ABNORMAL LOW (ref 12.0–15.0)
Immature Granulocytes: 1 %
Lymphocytes Relative: 32 %
Lymphs Abs: 1.3 10*3/uL (ref 0.7–4.0)
MCH: 30.5 pg (ref 26.0–34.0)
MCHC: 32.7 g/dL (ref 30.0–36.0)
MCV: 93.5 fL (ref 80.0–100.0)
Monocytes Absolute: 0.3 10*3/uL (ref 0.1–1.0)
Monocytes Relative: 7 %
Neutro Abs: 2.3 10*3/uL (ref 1.7–7.7)
Neutrophils Relative %: 59 %
Platelets: 264 10*3/uL (ref 150–400)
RBC: 2.62 MIL/uL — ABNORMAL LOW (ref 3.87–5.11)
RDW: 14.6 % (ref 11.5–15.5)
WBC: 3.9 10*3/uL — ABNORMAL LOW (ref 4.0–10.5)
nRBC: 0 % (ref 0.0–0.2)

## 2021-07-12 LAB — GLUCOSE, CAPILLARY
Glucose-Capillary: 109 mg/dL — ABNORMAL HIGH (ref 70–99)
Glucose-Capillary: 155 mg/dL — ABNORMAL HIGH (ref 70–99)
Glucose-Capillary: 121 mg/dL — ABNORMAL HIGH (ref 70–99)
Glucose-Capillary: 112 mg/dL — ABNORMAL HIGH (ref 70–99)

## 2021-07-12 MED ORDER — ACETAMINOPHEN 500 MG PO TABS
1000.0000 mg | ORAL_TABLET | Freq: Three times a day (TID) | ORAL | Status: DC
  Administered 2021-07-12 – 2021-07-17 (×14): 1000 mg via ORAL
  Filled 2021-07-12 (×14): qty 2

## 2021-07-12 MED ORDER — ASCORBIC ACID 500 MG PO TABS
1000.0000 mg | ORAL_TABLET | Freq: Every day | ORAL | Status: DC
  Administered 2021-07-12 – 2021-07-17 (×6): 1000 mg via ORAL
  Filled 2021-07-12 (×6): qty 2

## 2021-07-12 MED ORDER — SENNOSIDES-DOCUSATE SODIUM 8.6-50 MG PO TABS
1.0000 | ORAL_TABLET | Freq: Every day | ORAL | Status: DC
  Administered 2021-07-12 – 2021-07-13 (×2): 1 via ORAL
  Filled 2021-07-12 (×2): qty 1

## 2021-07-12 NOTE — TOC Initial Note (Signed)
Transition of Care [facility]) - Initial/Assessment Note    Patient Details  Name: [name] [name] MRN: [id number] Date of Birth: 12/14/1945  Transition [facility] Care [facility]) CM/SW Contact:    [name] [name], [place] Phone Number:[phone] 07/12/2021, 12:38 PM  Clinical Narrative:                 Met with patient at bedside to discuss SNF recommendation. Patient states that she lives alone, however has her sister available for support. Patient confirms living in a mobile home with a wheelchair ramp. She further confirms having a cain and walker in the home. Patient has no additional services in the home. SNF process discussed, patient states that she has no preference in terms of facilities. Fl2 completed and faxed out. Bed offers to be discussed with patient once received.   Transitional of Care to continue to follow.   [place], LCSW Transitional of Care [phone]   Expected Discharge Plan: Skilled Nursing Facility Barriers to Discharge: Continued Medical Work up   Patient Goals and CMS Choice Patient states their goals for this hospitalization and ongoing recovery are:: "I want to be able to walk again" CMS Medicare.gov Compare Post Acute Care list provided to:: Patient Choice offered to / list presented to : Patient  Expected Discharge Plan and Services Expected Discharge Plan: [place] In-house Referral: Clinical Social Work     Living arrangements for the past 2 months: Mobile Home                                      Prior Living Arrangements/Services Living arrangements for the past 2 months: Mobile Home Lives with:: Self Patient language and need for interpreter reviewed:: Yes Do you feel safe going back to the place where you live?: Yes      Need for Family Participation in Patient Care: Yes (Comment) Care giver support system in place?: No (comment) (lives alone, has sister who is supportive)   Criminal Activity/Legal Involvement  Pertinent to Current Situation/Hospitalization: No - Comment as needed  Activities of Daily Living Home Assistive Devices/Equipment: None ADL Screening (condition at time of admission) Patient's cognitive ability adequate to safely complete daily activities?: Yes Is the patient deaf or have difficulty hearing?: No Does the patient have difficulty seeing, even when wearing glasses/contacts?: No Does the patient have difficulty concentrating, remembering, or making decisions?: No Patient able to express need for assistance with ADLs?: Yes Does the patient have difficulty dressing or bathing?: Yes Independently performs ADLs?: No Communication: Independent Dressing (OT): Needs assistance Is this a change from baseline?: Change from baseline, expected to last <3days Grooming: Needs assistance Is this a change from baseline?: Change from baseline, expected to last <3 days Feeding: Independent Bathing: Needs assistance Is this a change from baseline?: Change from baseline, expected to last <3 days Toileting: Dependent Is this a change from baseline?: Change from baseline, expected to last <3 days Does the patient have difficulty walking or climbing stairs?: Yes Weakness of Legs: Both Weakness of Arms/Hands: None  Permission Sought/Granted   Permission granted to share information with : Yes, Verbal Permission Granted        Permission granted to share info w Relationship: norma prat-sister  Permission granted to share info w Contact Information: [phone]  Emotional Assessment Appearance:: Appears older than stated age Attitude/Demeanor/Rapport: Engaged Affect (typically observed): Accepting, Adaptable Orientation: : Oriented to Self, Oriented to  Place, Oriented to  Time, Oriented to Situation Alcohol / Substance Use: Not Applicable Psych Involvement: No (comment)  Admission diagnosis:  Femur fracture, right (HCC) [S72.91XA] Rib pain on left side [R07.81] Patient Active  Problem List   Diagnosis Date Noted   Femur fracture, right ([place]) 07/08/2021   Hypokalemia 04/10/2021   Atrial fibrillation ([place]) 04/10/2021   Type 2 diabetes mellitus ([place]) 04/10/2021   Right hip pain 04/10/2021   Hip pain 04/09/2021   Hypoglycemia 02/04/2021   Hypothermia 02/04/2021   Hypotension 02/04/2021   Atrial fibrillation, new onset ([place]) 02/04/2021   AKI (acute kidney injury) ([place]) 02/04/2021   Atrial fibrillation with rapid ventricular response ([place]) 02/04/2021   HLD (hyperlipidemia) 12/18/2010   HYPERTENSION, BENIGN 12/18/2010   SHORTNESS OF BREATH 12/18/2010   OBSTRUCTIVE SLEEP APNEA 01/11/2008   PCP:  Chesley Noon, MD Pharmacy:   [place], [place] - [address] [place] [place] [address] Phone: [phone] Fax: [place] [address] [place] [address] Phone: [phone] Fax: [phone]     Social Determinants of Health (SDOH) Interventions    Readmission Risk Interventions No flowsheet data found.

## 2021-07-12 NOTE — Progress Notes (Signed)
Orthopaedic Trauma Service Progress Note  Patient ID: [name] [name] MRN: [id number] DOB/AGE: 02-11-1946 75 y.o.  Subjective:  Doing ok, no acute issues  Lives by herself, only family is a sister who cannot care for her   Walker at baseline   ROS As above  Objective:   VITALS:   Vitals:   07/11/21 0800 07/11/21 1233 07/12/21 1041 07/12/21 1046  BP: 137/71 (!) 122/59 105/63 (!) 102/52  Pulse: 76 94 85 81  Resp: 19 18    Temp: 97.6 F (36.4 C) 98 F (36.7 C)    TempSrc: Oral     SpO2: 96% 97% 93% 93%  Weight:      Height:        Estimated body mass index is 36.91 kg/m as calculated from the following:   Height as of this encounter: 5' (1.524 m).   Weight as of this encounter: 85.7 kg.   Intake/Output      08/13 0701 08/14 0700 08/14 0701 08/15 0700   P.O.     I.V. (mL/kg)     IV Piggyback     Total Intake(mL/kg)     Urine (mL/kg/hr) 500 (0.2)    Stool     Blood     Total Output 500    Net -500           LABS  Results for orders placed or performed during the hospital encounter of 07/08/21 (from the past 24 hour(s))  Glucose, capillary     Status: Abnormal   Collection Time: 07/11/21 12:30 PM  Result Value Ref Range   Glucose-Capillary 170 (H) 70 - 99 mg/dL  Glucose, capillary     Status: Abnormal   Collection Time: 07/11/21  4:47 PM  Result Value Ref Range   Glucose-Capillary 188 (H) 70 - 99 mg/dL  Glucose, capillary     Status: Abnormal   Collection Time: 07/11/21 10:37 PM  Result Value Ref Range   Glucose-Capillary 149 (H) 70 - 99 mg/dL  CBC with Differential/Platelet     Status: Abnormal   Collection Time: 07/12/21  1:16 AM  Result Value Ref Range   WBC 3.9 (L) 4.0 - 10.5 K/uL   RBC 2.62 (L) 3.87 - 5.11 MIL/uL   Hemoglobin 8.0 (L) 12.0 - 15.0 g/dL   HCT 24.5 (L) 36.0 - 46.0 %   MCV 93.5 80.0 - 100.0 fL   MCH 30.5 26.0 - 34.0 pg   MCHC 32.7 30.0 - 36.0 g/dL    RDW 14.6 11.5 - 15.5 %   Platelets 264 150 - 400 K/uL   nRBC 0.0 0.0 - 0.2 %   Neutrophils Relative % 59 %   Neutro Abs 2.3 1.7 - 7.7 K/uL   Lymphocytes Relative 32 %   Lymphs Abs 1.3 0.7 - 4.0 K/uL   Monocytes Relative 7 %   Monocytes Absolute 0.3 0.1 - 1.0 K/uL   Eosinophils Relative 1 %   Eosinophils Absolute 0.0 0.0 - 0.5 K/uL   Basophils Relative 0 %   Basophils Absolute 0.0 0.0 - 0.1 K/uL   Immature Granulocytes 1 %   Abs Immature Granulocytes 0.02 0.00 - 0.07 K/uL  Glucose, capillary     Status: Abnormal   Collection Time: 07/12/21  6:45 AM  Result Value Ref Range   Glucose-Capillary 109 (  H) 70 - 99 mg/dL  Glucose, capillary     Status: Abnormal   Collection Time: 07/12/21 11:24 AM  Result Value Ref Range   Glucose-Capillary 155 (H) 70 - 99 mg/dL   Comment 1 Notify RN      PHYSICAL EXAM:   Gen: sitting up in bed, NAD Ext:        Right Lower Extremity   Knee immobilizer in place  Dressing clean, dry and intact  Ext warm   Motor and sensory functions intact   + DP pulse    Assessment/Plan: 2 Days Post-Op   Active Problems:   Femur fracture, right (HCC)   Anti-infectives (From admission, onward)    Start     Dose/Rate Route Frequency Ordered Stop   07/10/21 1640  ceFAZolin (ANCEF) IVPB 2g/100 mL premix        2 g 200 mL/hr over 30 Minutes Intravenous Every 8 hours 07/10/21 1321 07/11/21 0535   07/10/21 0719  ceFAZolin (ANCEF) 2-4 GM/100ML-% IVPB       Note to Pharmacy: Valda Lamb   : cabinet override      07/10/21 0719 07/10/21 1929   07/10/21 0700  ceFAZolin (ANCEF) IVPB 2g/100 mL premix        2 g 200 mL/hr over 30 Minutes Intravenous  Once 07/09/21 1547 07/10/21 2255     .  POD/HD#: 2  75 y/o female s/p fall with R supracondylar distal femur fracture   -fall  - R distal femur fracture s/p ORIF   NWB R leg  Unrestricted ROM R knee   Dc knee immobilizer   PT/OT  Ice and elevate    PT- please teach HEP for R knee ROM- AROM, PROM. Prone  exercises as well. No ROM restrictions.  Quad sets, SLR, LAQ, SAQ, heel slides, stretching, prone flexion and extension  Ankle theraband program, heel cord stretching, toe towel curls, etc  No pillows under bend of knee when at rest, ok to place under heel to help work on extension. Can also use zero knee bone foam if available  - Pain management:  Multimodal  - DVT/PE prophylaxis:  Home eliquis - ID:   Periop abx completed   - Metabolic Bone Disease:  Vitamin d deficiency    Supplement   H/o osteoporosis---. Most recent dexa 2017 T score -2.6  - Activity:  Up with assistance  NWB R leg   - Impediments to fracture healing:  Vitamin d deficiency   H/o falls  Poor bone quality/osteoporosis    - Dispo:  Therapy evals  TOC consult for SNF  Ortho issues stable     Jari Pigg, PA-C [phone] (C) 07/12/2021, 12:28 PM  [facility] [place] [address] [phone] Jenetta [phone] (F)    After 5pm and on the weekends please log on to Amion, go to orthopaedics and the look under the [facility] Call for the provider(s) on call. You can also call our office at [phone] and then follow the prompts to be connected to the call team.

## 2021-07-12 NOTE — NC FL2 (Signed)
[place] LEVEL OF CARE SCREENING TOOL     IDENTIFICATION  Patient Name: [name] [name] Birthdate: [birth year] Sex: female Admission Date (Current Location): 07/08/2021  [facility] and [place] Number:  Herbalist and Address:  [facility] [place], [place] [address] [place], [place] [address]      Provider Number: [id number]  Attending Physician Name and Address:  Angelica Pou, MD  Relative Name and Phone Number:  Lovena Neighbours, [phone]    Current Level of Care: Hospital Recommended Level of Care: [place] Prior Approval Number:    Date Approved/Denied: 07/12/21 PASRR Number: [id number]  Discharge Plan: SNF    Current Diagnoses: Patient Active Problem List   Diagnosis Date Noted   Femur fracture, right ([place]) 07/08/2021   Hypokalemia 04/10/2021   Atrial fibrillation ([place]) 04/10/2021   Type 2 diabetes mellitus ([place]) 04/10/2021   Right hip pain 04/10/2021   Hip pain 04/09/2021   Hypoglycemia 02/04/2021   Hypothermia 02/04/2021   Hypotension 02/04/2021   Atrial fibrillation, new onset ([place]) 02/04/2021   AKI (acute kidney injury) ([place]) 02/04/2021   Atrial fibrillation with rapid ventricular response ([place]) 02/04/2021   HLD (hyperlipidemia) 12/18/2010   HYPERTENSION, BENIGN 12/18/2010   SHORTNESS OF BREATH 12/18/2010   OBSTRUCTIVE SLEEP APNEA 01/11/2008    Orientation RESPIRATION BLADDER Height & Weight     Self, Time, Situation, Place  O2 (nasaul cannula 2/l) Incontinent (catheter,) Weight: 189 lb (85.7 kg) Height:  5' (152.4 cm)  BEHAVIORAL SYMPTOMS/MOOD NEUROLOGICAL BOWEL NUTRITION STATUS      Incontinent (mixed episodes) Diet (carb modified)  AMBULATORY STATUS COMMUNICATION OF NEEDS Skin   Extensive Assist   Normal                       Personal Care Assistance Level of Assistance  Bathing, Feeding, Dressing Bathing Assistance: Maximum assistance Feeding assistance: Limited  assistance Dressing Assistance: Limited assistance     Functional Limitations Info             SPECIAL CARE FACTORS FREQUENCY  PT (By licensed PT), OT (By licensed OT)     PT Frequency: 5x weekly OT Frequency: 5x weekly            Contractures Contractures Info: Not present    Additional Factors Info  Code Status, Allergies Code Status Info: DNR Allergies Info: ace inhibitors, codeine, morphine, oxycodone           Current Medications (07/12/2021):  This is the current hospital active medication list Current Facility-Administered Medications  Medication Dose Route Frequency Provider Last Rate Last Admin   acetaminophen (TYLENOL) tablet 1,000 mg  1,000 mg Oral Q8H Virl Axe, MD   1,000 mg at 07/12/21 1415   ALPRAZolam (XANAX) tablet 0.25 mg  0.25 mg Oral BID PRN Timothy Lasso, MD       amiodarone (PACERONE) tablet 200 mg  200 mg Oral BID Ainsley Spinner, PA-C   200 mg at 07/12/21 1041   apixaban (ELIQUIS) tablet 5 mg  5 mg Oral BID Virl Axe, MD   5 mg at 07/12/21 1039   ascorbic acid (VITAMIN C) tablet 1,000 mg  1,000 mg Oral Daily Ainsley Spinner, PA-C   1,000 mg at 07/12/21 1414   atorvastatin (LIPITOR) tablet 40 mg  40 mg Oral Daily Ainsley Spinner, PA-C   40 mg at 07/12/21 1040   Chlorhexidine Gluconate Cloth 2 % PADS 6 each  6 each Topical Q0600 Eddie Dibbles,  Lanny Hurst, PA-C   6 each at 07/11/21 0507   cholecalciferol (VITAMIN D3) tablet 2,000 Units  2,000 Units Oral BID Ainsley Spinner, PA-C   2,000 Units at 07/12/21 1040   feeding supplement (GLUCERNA SHAKE) (GLUCERNA SHAKE) liquid 237 mL  237 mL Oral TID BM Ainsley Spinner, PA-C   237 mL at 07/12/21 1414   FLUoxetine (PROZAC) capsule 40 mg  40 mg Oral Daily Ainsley Spinner, PA-C   40 mg at 07/12/21 1040   insulin aspart (novoLOG) injection 0-15 Units  0-15 Units Subcutaneous TID WC Ainsley Spinner, PA-C   3 Units at 07/12/21 1159   insulin aspart (novoLOG) injection 0-5 Units  0-5 Units Subcutaneous QHS Ainsley Spinner, PA-C       multivitamin  with minerals tablet 1 tablet  1 tablet Oral Daily Ainsley Spinner, PA-C   1 tablet at 07/12/21 1041   mupirocin ointment (BACTROBAN) 2 % 1 application  1 application Nasal BID Ainsley Spinner, PA-C   1 application at [id number] 1041   ondansetron (ZOFRAN) tablet 4 mg  4 mg Oral Q6H PRN Ainsley Spinner, PA-C       Or   ondansetron [facility]) injection 4 mg  4 mg Intravenous Q6H PRN Ainsley Spinner, PA-C       polyethylene glycol (MIRALAX / GLYCOLAX) packet 17 g  17 g Oral Daily PRN Ainsley Spinner, PA-C       senna-docusate (Senokot-S) tablet 1 tablet  1 tablet Oral Daily Timothy Lasso, MD   1 tablet at 07/12/21 1414   traMADol (ULTRAM) tablet 50 mg  50 mg Oral Q6H Timothy Lasso, MD   50 mg at 07/12/21 1159     Discharge Medications: Please see discharge summary for a list of discharge medications.  Relevant Imaging Results:  Relevant Lab Results:   Additional Information [phone]. Patient has not had the [place] vaccinations  Oretha Milch, LCSW

## 2021-07-12 NOTE — Progress Notes (Signed)
Subjective: I seen and evaluated [name] [name] at bedside.  She appeared to be uncomfortable and tearful.  She states that she did not sleep well last night due to pain in her right leg. She stated that PT and OT evaluated her yesterday and some of her pain was exacerbated from movement. She denies receiving any pain medication overnight, although requested. She also reports poor positioning in the bed, leaning more towards her right side that caused discomfort.  She denies headache, chest pain, shortness of breath, abdominal pain, N/V, or diarrhea.  She states she was unable to pass a bowel movement and reports straining on the toilet for about an hour.    Objective:  Vital signs in last 24 hours: Vitals:   07/11/21 0800 07/11/21 1233 07/12/21 1041 07/12/21 1046  BP: 137/71 (!) 122/59 105/63 (!) 102/52  Pulse: 76 94 85 81  Resp: 19 18    Temp: 97.6 F (36.4 C) 98 F (36.7 C)    TempSrc: Oral     SpO2: 96% 97% 93% 93%  Weight:      Height:       Physical Exam Constitutional:      General: She is awake.  HENT:     Head: Normocephalic and atraumatic.  Cardiovascular:     Rate and Rhythm: Normal rate.     Pulses:          Radial pulses are 2+ on the right side and 2+ on the left side.     Heart sounds: Heart sounds are distant.  Pulmonary:     Effort: Pulmonary effort is normal.     Breath sounds: Normal breath sounds.  Abdominal:     Palpations: Abdomen is soft.  Musculoskeletal:     Left lower leg: No edema.     Comments: Right leg noted to be bandaged from ankle to hip.  Knee brace Present.  Skin:    General: Skin is warm and dry.  Neurological:     Mental Status: She is alert and oriented to person, place, and time.  Psychiatric:        Behavior: Behavior is cooperative.     Comments: Tearful during interview      Assessment/Plan:  Active Problems:   Femur fracture, right ([place])  # Distal Femur Fracture, right # Osteoporosis  POD-2 of ORIF Patient is  interested in going to a nursing facility after surgery and discharge. PT/OT has evaluated this patient and also recommends SNF placement. ---TOC consulted for SNF placement --- Eliquis resumed, 5 mg twice daily --- Tylenol 1000 mg every 8 hours for pain control --- Tramadol 50 mg every 6 hours for pain control   # Atrial Fibrillation  - Amiodarone 200 mg twice daily - Eliquis resumed, 5 mg twice daily.   # Type 2 Diabetes  Currently managed with diet only. Patient states she was previously on insulin but was taken off due to hypoglycemia in March 2022.  She checks her sugars at home twice daily; A1c was 5.4% in July 2022.  Current CBGs <160.  - SSI, moderate scale   # Anxiety  Depression  - Restart home fluoxetine --- Alprazolam 0.25 twice daily as needed; opioids have been discontinued, so this medication can be given   Prior to Admission Living Arrangement: Anticipated Discharge Location: Barriers to Discharge: Dispo: Anticipated discharge in approximately 1-2 day(s).   Timothy Lasso, MD 07/12/2021, 11:52 AM Pager: [phone] After 5pm on weekdays and 1pm on weekends: On Call pager  [phone]  

## 2021-07-13 DIAGNOSIS — I4891 Unspecified atrial fibrillation: Secondary | ICD-10-CM | POA: Diagnosis not present

## 2021-07-13 DIAGNOSIS — E119 Type 2 diabetes mellitus without complications: Secondary | ICD-10-CM | POA: Diagnosis not present

## 2021-07-13 LAB — CBC WITH DIFFERENTIAL/PLATELET
Abs Immature Granulocytes: 0.02 10*3/uL (ref 0.00–0.07)
Basophils Absolute: 0 10*3/uL (ref 0.0–0.1)
Basophils Relative: 1 %
Eosinophils Absolute: 0.2 10*3/uL (ref 0.0–0.5)
Eosinophils Relative: 4 %
HCT: 26.4 % — ABNORMAL LOW (ref 36.0–46.0)
Hemoglobin: 8.7 g/dL — ABNORMAL LOW (ref 12.0–15.0)
Immature Granulocytes: 1 %
Lymphocytes Relative: 34 %
Lymphs Abs: 1.3 10*3/uL (ref 0.7–4.0)
MCH: 31 pg (ref 26.0–34.0)
MCHC: 33 g/dL (ref 30.0–36.0)
MCV: 94 fL (ref 80.0–100.0)
Monocytes Absolute: 0.4 10*3/uL (ref 0.1–1.0)
Monocytes Relative: 10 %
Neutro Abs: 1.9 10*3/uL (ref 1.7–7.7)
Neutrophils Relative %: 50 %
Platelets: 258 10*3/uL (ref 150–400)
RBC: 2.81 MIL/uL — ABNORMAL LOW (ref 3.87–5.11)
RDW: 14.4 % (ref 11.5–15.5)
WBC: 3.7 10*3/uL — ABNORMAL LOW (ref 4.0–10.5)
nRBC: 0 % (ref 0.0–0.2)

## 2021-07-13 LAB — BASIC METABOLIC PANEL
Anion gap: 7 (ref 5–15)
BUN: 21 mg/dL (ref 8–23)
CO2: 25 mmol/L (ref 22–32)
Calcium: 8.2 mg/dL — ABNORMAL LOW (ref 8.9–10.3)
Chloride: 100 mmol/L (ref 98–111)
Creatinine, Ser: 0.67 mg/dL (ref 0.44–1.00)
GFR, Estimated: >60 mL/min (ref >60)
Glucose, Bld: 111 mg/dL — ABNORMAL HIGH (ref 70–99)
Potassium: 4.6 mmol/L (ref 3.5–5.1)
Sodium: 132 mmol/L — ABNORMAL LOW (ref 135–145)

## 2021-07-13 LAB — GLUCOSE, CAPILLARY
Glucose-Capillary: 105 mg/dL — ABNORMAL HIGH (ref 70–99)
Glucose-Capillary: 128 mg/dL — ABNORMAL HIGH (ref 70–99)
Glucose-Capillary: 120 mg/dL — ABNORMAL HIGH (ref 70–99)
Glucose-Capillary: 137 mg/dL — ABNORMAL HIGH (ref 70–99)

## 2021-07-13 MED ORDER — SENNOSIDES-DOCUSATE SODIUM 8.6-50 MG PO TABS: 1.0000 | ORAL_TABLET | Freq: Every evening | ORAL | Status: DC | PRN

## 2021-07-13 MED ORDER — SODIUM CHLORIDE 0.9 % IV SOLN: INTRAVENOUS | Status: AC

## 2021-07-13 MED ORDER — SODIUM CHLORIDE 0.9 % IV BOLUS
1000.0000 mL | Freq: Once | INTRAVENOUS | Status: AC
  Administered 2021-07-13: 1000 mL via INTRAVENOUS

## 2021-07-13 NOTE — Plan of Care (Signed)
  Problem: Education: Goal: Knowledge of General Education information will improve Description: Including pain rating scale, medication(s)/side effects and non-pharmacologic comfort measures Outcome: Progressing   Problem: Health Behavior/Discharge Planning: Goal: Ability to manage health-related needs will improve Outcome: Progressing   Problem: Clinical Measurements: Goal: Will remain free from infection Outcome: Progressing   Problem: Activity: Goal: Risk for activity intolerance will decrease Outcome: Progressing   Problem: Nutrition: Goal: Adequate nutrition will be maintained Outcome: Progressing   Problem: Coping: Goal: Level of anxiety will decrease Outcome: Progressing   Problem: Elimination: Goal: Will not experience complications related to bowel motility Outcome: Progressing   Problem: Pain Managment: Goal: General experience of comfort will improve Outcome: Progressing   Problem: Safety: Goal: Ability to remain free from injury will improve Outcome: Progressing

## 2021-07-13 NOTE — TOC Progression Note (Addendum)
Transition of Care [facility]) - Progression Note    Patient Details  Name: [name] [name] MRN: [id number] Date of Birth: 1946-07-31  Transition of Care [facility]) CM/SW Contact  [name] [name], [name] [name] Phone Number: 07/13/2021, 11:58 AM  Clinical Narrative:    CSW met with the patient and the patient's sister at bedside to present bed offers.  The patient's sister requested that CSW send referral to [facility], if [facility] cannot accept the patient would like to go to [facility].  CSW faxed referral to [facility] and is awaiting a response.   14:41- CSW spoke with [facility] and was informed that the facility no longer accepts [name].    CSW then contacted [name] [name] with [facility].  The facility is able to accept the patient and will begin insurance auth.  CSW called the patient's sister [name] [name] and updated her on the status of placement.  The sister reported that the patient has not received any COVID vaccinations.    Expected Discharge Plan: [place] Barriers to Discharge: Continued Medical Work up  Expected Discharge Plan and Services Expected Discharge Plan: [place] In-house Referral: Clinical Social Work     Living arrangements for the past 2 months: Mobile Home                                       Social Determinants of Health (SDOH) Interventions    Readmission Risk Interventions No flowsheet data found.

## 2021-07-13 NOTE — Discharge Instructions (Addendum)
Orthopaedic Trauma Service Discharge Instructions   General Discharge Instructions  Orthopaedic Injuries:  Right distal femur fracture treated with open reduction and internal fixation using plate and screws  WEIGHT BEARING STATUS: Nonweight-bearing Right Lower extremity   RANGE OF MOTION/ACTIVITY:unrestricted range of motion right knee. Do not place pillows under the bend of knee when at rest, this can cause a flexion contracture.  Use bone foam when in bed or place pillows under ankle.  Activity as tolerated while maintaining weightbearing restrictions   Bone health: labs show vitamin d deficiency. Recommend 5000 IU vitamin d3 daily   Wound Care: daily wound care starting now.  See below   Discharge Wound Care Instructions  Do NOT apply any ointments, solutions or lotions to pin sites or surgical wounds.  These prevent needed drainage and even though solutions like hydrogen peroxide kill bacteria, they also damage cells lining the pin sites that help fight infection.  Applying lotions or ointments can keep the wounds moist and can cause them to breakdown and open up as well. This can increase the risk for infection. When in doubt call the office.  Surgical incisions should be dressed daily.  If any drainage is noted, use one layer of adaptic, then gauze, Kerlix, and an ace wrap.  Once the incision is completely dry and without drainage, it may be left open to air out.  Showering may begin 36-48 hours later.  Cleaning gently with soap and water.   DVT/PE prophylaxis: home eliquis  Diet: as you were eating previously.  Can use over the counter stool softeners and bowel preparations, such as Miralax, to help with bowel movements.  Narcotics can be constipating.  Be sure to drink plenty of fluids  PAIN MEDICATION USE AND EXPECTATIONS  You have likely been given narcotic medications to help control your pain.  After a traumatic event that results in an fracture (broken bone) with  or without surgery, it is ok to use narcotic pain medications to help control one's pain.  We understand that everyone responds to pain differently and each individual patient will be evaluated on a regular basis for the continued need for narcotic medications. Ideally, narcotic medication use should last no more than 6-8 weeks (coinciding with fracture healing).   As a patient it is your responsibility as well to monitor narcotic medication use and report the amount and frequency you use these medications when you come to your office visit.   We would also advise that if you are using narcotic medications, you should take a dose prior to therapy to maximize you participation.  IF YOU ARE ON NARCOTIC MEDICATIONS IT IS NOT PERMISSIBLE TO OPERATE A MOTOR VEHICLE (MOTORCYCLE/CAR/TRUCK/MOPED) OR HEAVY MACHINERY DO NOT MIX NARCOTICS WITH OTHER CNS (CENTRAL NERVOUS SYSTEM) DEPRESSANTS SUCH AS ALCOHOL   POST-OPERATIVE OPIOID TAPER INSTRUCTIONS: It is important to wean off of your opioid medication as soon as possible. If you do not need pain medication after your surgery it is ok to stop day one. Opioids include: Codeine, Hydrocodone(Norco, Vicodin), Oxycodone(Percocet, oxycontin) and hydromorphone amongst others.  Long term and even short term use of opiods can cause: Increased pain response Dependence Constipation Depression Respiratory depression And more.  Withdrawal symptoms can include Flu like symptoms Nausea, vomiting And more Techniques to manage these symptoms Hydrate well Eat regular healthy meals Stay active Use relaxation techniques(deep breathing, meditating, yoga) Do Not substitute Alcohol to help with tapering If you have been on opioids for less than two weeks and  do not have pain than it is ok to stop all together.  Plan to wean off of opioids This plan should start within one week post op of your fracture surgery  Maintain the same interval or time between taking each dose  and first decrease the dose.  Cut the total daily intake of opioids by one tablet each day Next start to increase the time between doses. The last dose that should be eliminated is the evening dose.    STOP SMOKING OR USING NICOTINE PRODUCTS!!!!  As discussed nicotine severely impairs your body's ability to heal surgical and traumatic wounds but also impairs bone healing.  Wounds and bone heal by forming microscopic blood vessels (angiogenesis) and nicotine is a vasoconstrictor (essentially, shrinks blood vessels).  Therefore, if vasoconstriction occurs to these microscopic blood vessels they essentially disappear and are unable to deliver necessary nutrients to the healing tissue.  This is one modifiable factor that you can do to dramatically increase your chances of healing your injury.    (This means no smoking, no nicotine gum, patches, etc)  DO NOT USE NONSTEROIDAL ANTI-INFLAMMATORY DRUGS (NSAID'S)  Using products such as Advil (ibuprofen), Aleve (naproxen), Motrin (ibuprofen) for additional pain control during fracture healing can delay and/or prevent the healing response.  If you would like to take over the counter (OTC) medication, Tylenol (acetaminophen) is ok.  However, some narcotic medications that are given for pain control contain acetaminophen as well. Therefore, you should not exceed more than 4000 mg of tylenol in a day if you do not have liver disease.  Also note that there are may OTC medicines, such as cold medicines and allergy medicines that my contain tylenol as well.  If you have any questions about medications and/or interactions please ask your doctor/PA or your pharmacist.      ICE AND ELEVATE INJURED/OPERATIVE EXTREMITY  Using ice and elevating the injured extremity above your heart can help with swelling and pain control.  Icing in a pulsatile fashion, such as 20 minutes on and 20 minutes off, can be followed.    Do not place ice directly on skin. Make sure there is a  barrier between to skin and the ice pack.    Using frozen items such as frozen peas works well as the conform nicely to the are that needs to be iced.  USE AN ACE WRAP OR TED HOSE FOR SWELLING CONTROL  In addition to icing and elevation, Ace wraps or TED hose are used to help limit and resolve swelling.  It is recommended to use Ace wraps or TED hose until you are informed to stop.    When using Ace Wraps start the wrapping distally (farthest away from the body) and wrap proximally (closer to the body)   Example: If you had surgery on your leg or thing and you do not have a splint on, start the ace wrap at the toes and work your way up to the thigh        If you had surgery on your upper extremity and do not have a splint on, start the ace wrap at your fingers and work your way up to the upper arm  IF YOU ARE IN A SPLINT OR CAST DO NOT [place]   If your splint gets wet for any reason please contact the office immediately. You may shower in your splint or cast as long as you keep it dry.  This can be done by wrapping  in a cast cover or garbage back (or similar)  Do Not stick any thing down your splint or cast such as pencils, money, or hangers to try and scratch yourself with.  If you feel itchy take benadryl as prescribed on the bottle for itching  IF YOU ARE IN A CAM BOOT (BLACK BOOT)  You may remove boot periodically. Perform daily dressing changes as noted below.  Wash the liner of the boot regularly and wear a sock when wearing the boot. It is recommended that you sleep in the boot until told otherwise    Call office for the following: Temperature greater than 101F Persistent nausea and vomiting Severe uncontrolled pain Redness, tenderness, or signs of infection (pain, swelling, redness, odor or green/yellow discharge around the site) Difficulty breathing, headache or visual disturbances Hives Persistent dizziness or light-headedness Extreme fatigue Any other questions  or concerns you may have after discharge  In an emergency, call 911 or go to an Emergency Department at a nearby hospital  HELPFUL INFORMATION  If you had a block, it will wear off between 8-24 hrs postop typically.  This is period when your pain may go from nearly zero to the pain you would have had postop without the block.  This is an abrupt transition but nothing dangerous is happening.  You may take an extra dose of narcotic when this happens.  You should wean off your narcotic medicines as soon as you are able.  Most patients will be off or using minimal narcotics before their first postop appointment.   We suggest you use the pain medication the first night prior to going to bed, in order to ease any pain when the anesthesia wears off. You should avoid taking pain medications on an empty stomach as it will make you nauseous.  Do not drink alcoholic beverages or take illicit drugs when taking pain medications.  In most states it is against the law to drive while you are in a splint or sling.  And certainly against the law to drive while taking narcotics.  You may return to work/school in the next couple of days when you feel up to it.   Pain medication may make you constipated.  Below are a few solutions to try in this order: Decrease the amount of pain medication if you aren't having pain. Drink lots of decaffeinated fluids. Drink prune juice and/or each dried prunes  If the first 3 don't work start with additional solutions Take Colace - an over-the-counter stool softener Take Senokot - an over-the-counter laxative Take Miralax - a stronger over-the-counter laxative     CALL THE OFFICE WITH ANY QUESTIONS OR CONCERNS: [phone]   VISIT OUR WEBSITE FOR ADDITIONAL INFORMATION: [email or web address]     Information on my medicine - ELIQUIS (apixaban)   Why was Eliquis prescribed for you? Eliquis was prescribed for you to reduce the risk of forming blood clots that can  cause a stroke if you have a medical condition called atrial fibrillation (a type of irregular heartbeat) OR to reduce the risk of a blood clots forming after orthopedic surgery.  What do You need to know about Eliquis ? Take your Eliquis TWICE DAILY - one tablet in the morning and one tablet in the evening with or without food.  It would be best to take the doses about the same time each day.  If you have difficulty swallowing the tablet whole please discuss with your pharmacist how to take the medication safely.  Take Eliquis  exactly as prescribed by your doctor and DO NOT stop taking Eliquis without talking to the doctor who prescribed the medication.  Stopping may increase your risk of developing a new clot or stroke.  Refill your prescription before you run out.  After discharge, you should have regular check-up appointments with your healthcare provider that is prescribing your Eliquis.  In the future your dose may need to be changed if your kidney function or weight changes by a significant amount or as you get older.  What do you do if you miss a dose? If you miss a dose, take it as soon as you remember on the same day and resume taking twice daily.  Do not take more than one dose of ELIQUIS at the same time.  Important Safety Information A possible side effect of Eliquis is bleeding. You should call your healthcare provider right away if you experience any of the following: Bleeding from an injury or your nose that does not stop. Unusual colored urine (red or dark brown) or unusual colored stools (red or black). Unusual bruising for unknown reasons. A serious fall or if you hit your head (even if there is no bleeding).  Some medicines may interact with Eliquis and might increase your risk of bleeding or clotting while on Eliquis. To help avoid this, consult your healthcare provider or pharmacist prior to using any new prescription or non-prescription medications, including  herbals, vitamins, non-steroidal anti-inflammatory drugs (NSAIDs) and supplements.  This website has more information on Eliquis (apixaban): [email or web address].

## 2021-07-13 NOTE — Progress Notes (Signed)
Subjective: I seen and evaluated [name] [name] at bedside.  She reports not getting any sleep last night and that she finally fell asleep around 6 AM after receiving her pain medicine.  She reports that it is still uncomfortable to move her right leg and that it feels heavy with movement.  She reports experiencing minimal pain at the moment but rates the pain 7 out of 10.  However, she does not wish to adjust her pain medication.  She denies headaches, shortness of breath, chest pain, palpitations, nausea or vomiting.  She reports increased her fluid intake.  She does complain of a right "crook" in her neck due to how she is position in bed.  She denies having a bowel movement today.  Her bed, however, is soiled.   Objective:  Vital signs in last 24 hours: Vitals:   07/12/21 1041 07/12/21 1046 07/12/21 2000 07/13/21 0739  BP: 105/63 (!) 102/52 111/65 (!) 102/46  Pulse: 85 81 66 83  Resp:   18 14  Temp:   98.3 F (36.8 C) 97.9 F (36.6 C)  TempSrc:   Oral Oral  SpO2: 93% 93% 95% 98%  Weight:      Height:       Physical Exam Constitutional:      General: She is awake.  HENT:     Head: Normocephalic and atraumatic.  Cardiovascular:     Rate and Rhythm: Normal rate. Rhythm irregular.     Pulses:          Radial pulses are 2+ on the right side and 2+ on the left side.  Pulmonary:     Effort: Pulmonary effort is normal.     Breath sounds: Normal breath sounds.  Abdominal:     Palpations: Abdomen is soft.  Musculoskeletal:     Right lower leg: No edema.     Left lower leg: No edema.     Comments: Right leg is bandaged from hip to ankle. Patient is able to wiggle her toes and sensation intact.  Skin:    General: Skin is warm and dry.  Neurological:     General: No focal deficit present.     Mental Status: She is alert.  Psychiatric:        Attention and Perception: Attention normal.        Mood and Affect: Mood normal.        Behavior: Behavior normal. Behavior is cooperative.      Assessment/Plan:  Active Problems:   Femur fracture, right (HCC)  # Distal Femur Fracture, right # Osteoporosis  POD-3 of ORIF Currently awaiting SNF placement.  There was a discrepancy with the patient describing her pain.  Initially she states the pain is minimal, however, she rates the pain 7 out of 10.  Although she does not wish to adjust the pain medication at this time, will reassess pain scale.  May consider Toradol for pain control, if patient is still complaining of pain tomorrow. --- Continue Eliquis 5 mg twice daily --- Tylenol 1000 mg every 8 hours for pain control --- Tramadol 50 mg every 6 hours for pain control   # Atrial Fibrillation  - Amiodarone 200 mg twice daily - Continue Eliquis 5 mg twice daily.   # Type 2 Diabetes  Currently managed with diet only. Patient states she was previously on insulin but was taken off due to hypoglycemia in March 2022.  She checks her sugars at home twice daily; A1c was 5.4% in July  2022.  Current CBGs <150.  - SSI, moderate scale   # Anxiety  Depression  - Restart home fluoxetine --- Alprazolam 0.25 twice daily as needed  Prior to Admission Living Arrangement: Anticipated Discharge Location: Barriers to Discharge: Dispo: Anticipated discharge in approximately 1-2 day(s).   Timothy Lasso, MD 07/13/2021, 12:43 PM Pager: [phone] After 5pm on weekdays and 1pm on weekends: On Call pager [phone]

## 2021-07-14 ENCOUNTER — Encounter (HOSPITAL_COMMUNITY): Payer: Self-pay | Admitting: Orthopedic Surgery

## 2021-07-14 DIAGNOSIS — F419 Anxiety disorder, unspecified: Secondary | ICD-10-CM

## 2021-07-14 DIAGNOSIS — E119 Type 2 diabetes mellitus without complications: Secondary | ICD-10-CM | POA: Diagnosis not present

## 2021-07-14 DIAGNOSIS — F32A Depression, unspecified: Secondary | ICD-10-CM

## 2021-07-14 DIAGNOSIS — I4891 Unspecified atrial fibrillation: Secondary | ICD-10-CM | POA: Diagnosis not present

## 2021-07-14 LAB — CBC WITH DIFFERENTIAL/PLATELET
Abs Immature Granulocytes: 0.02 10*3/uL (ref 0.00–0.07)
Basophils Absolute: 0 10*3/uL (ref 0.0–0.1)
Basophils Relative: 0 %
Eosinophils Absolute: 0.2 10*3/uL (ref 0.0–0.5)
Eosinophils Relative: 4 %
HCT: 28.6 % — ABNORMAL LOW (ref 36.0–46.0)
Hemoglobin: 9.2 g/dL — ABNORMAL LOW (ref 12.0–15.0)
Immature Granulocytes: 0 %
Lymphocytes Relative: 24 %
Lymphs Abs: 1.3 10*3/uL (ref 0.7–4.0)
MCH: 30 pg (ref 26.0–34.0)
MCHC: 32.2 g/dL (ref 30.0–36.0)
MCV: 93.2 fL (ref 80.0–100.0)
Monocytes Absolute: 0.5 10*3/uL (ref 0.1–1.0)
Monocytes Relative: 9 %
Neutro Abs: 3.4 10*3/uL (ref 1.7–7.7)
Neutrophils Relative %: 63 %
Platelets: 362 10*3/uL (ref 150–400)
RBC: 3.07 MIL/uL — ABNORMAL LOW (ref 3.87–5.11)
RDW: 14.3 % (ref 11.5–15.5)
WBC: 5.4 10*3/uL (ref 4.0–10.5)
nRBC: 0 % (ref 0.0–0.2)

## 2021-07-14 LAB — GLUCOSE, CAPILLARY
Glucose-Capillary: 117 mg/dL — ABNORMAL HIGH (ref 70–99)
Glucose-Capillary: 122 mg/dL — ABNORMAL HIGH (ref 70–99)
Glucose-Capillary: 160 mg/dL — ABNORMAL HIGH (ref 70–99)
Glucose-Capillary: 149 mg/dL — ABNORMAL HIGH (ref 70–99)

## 2021-07-14 MED ORDER — SENNOSIDES-DOCUSATE SODIUM 8.6-50 MG PO TABS
1.0000 | ORAL_TABLET | Freq: Two times a day (BID) | ORAL | Status: DC
  Administered 2021-07-14 – 2021-07-15 (×3): 1 via ORAL
  Filled 2021-07-14 (×3): qty 1

## 2021-07-14 MED ORDER — DICLOFENAC SODIUM 1 % EX GEL
2.0000 g | Freq: Four times a day (QID) | CUTANEOUS | Status: DC
  Administered 2021-07-14 – 2021-07-17 (×8): 2 g via TOPICAL
  Filled 2021-07-14: qty 100

## 2021-07-14 NOTE — Progress Notes (Signed)
Interval Progress Note:   Paged by RN this evening for concerns of chest pain and inability to take a deep breath. Patient was evaluated at bedside. She is resting comfortably in bed. She reports experiencing left sided chest pain below her left breast after being turned in bed. She reports that it is the location where she has a broken rib and the pain is secondary to irritation. As a result of the pain, she is having trouble taking a deep breath.   Blood pressure 122/71, pulse 72, temperature 98.4 F (36.9 C), temperature source Oral, resp. rate 17, height 5' (1.524 m), weight 85.7 kg, SpO2 97 %. Physical Exam  Constitutional: Obese elderly female, no acute distress  Cardiovascular: Normal rate, regular rhythm, S1 and S2 present, no murmurs, rubs, gallops.  Distal pulses intact Respiratory: No respiratory distress, no accessory muscle use.  Lungs are clear to auscultation bilaterally. GI: Nondistended, soft, nontender to palpation, normal bowel sounds Musculoskeletal: Normal bulk and tone.  No peripheral edema noted. Pinpoint TTP in midclavicular line under left breast (rib 6-7) Skin: Warm and dry.  No rash, erythema, lesions noted.   Assessment/Plan: Patient expresses point tenderness to palpation of the 6th-7th rib midclavicular line without radiation. She reports that this is after repositioning, suspect that she may have had possible injury. Prior CXR on 8/10 without evidence of any displaced rib fractures. Suspect she may have a component of chostocondritis. ACS is low on differential at this time given that pain is pinpoint and is reproducible on exam, however, will obtain EKG for further evaluation.  PE is also low on differential given that BP is stable and she does not require any oxygen at this time. She has been receiving therapeutic dose anticoagulation since 8/13. Supsect pain is most likely MSK at this time.  - F/u EKG - K  pad to L breast + voltaren gel - If persistent, will  obtain Troponins and CXR   Harvie Heck, MD Internal Medicine, PGY-3 07/14/21 8:39 PM Pager # [phone]

## 2021-07-14 NOTE — Progress Notes (Signed)
Physical Therapy Treatment Patient Details Name: [name] [name] MRN: [id number] DOB: [birth year] Today's Date: 07/14/2021    History of Present Illness 75 y.o. female admitted on 07/08/21 with R obliquely oriented intra-articular distal femur fracture, extending from the medial metadiaphysis through the intercondylar notch into the knee joint s/p fall. Pt. reports multiple falls over last 8 months due to weakness. CT head and C spine (-) for any acute findings. Underwent R distal femur ORIF on 07/10/21. PMH  type 2 diabetes, hypertension, anxiety, anemia, obesity (BMI 37.76), GERD, sleep apnea, colon CA, a-fib.  PLOF is mod IND with use of RW for household distances, assist with ADLs from sister.    PT Comments    Pt supine in bed.  She continues to require significant assistance to mobilize OOB to recliner. She was observed out of knee immobilizer resting in ER of R hip and R knee flexion.  Placed in bone foam post session and informed nurse if knee relaxes to place knee immobilizer back on.  Continue to recommend snf placement.     Follow Up Recommendations  SNF     Equipment Recommendations  Wheelchair (measurements PT);Wheelchair cushion (measurements PT)    Recommendations for Other Services       Precautions / Restrictions Precautions Precautions: Fall Precaution Comments: Rest with knee in extension only with knee immobilizer on and bone foam in place, okay to remove immobilizer for PT.  Unrestricted ROM for PT. Required Braces or Orthoses: Knee Immobilizer - Right (not on when entering room and knee flexed with hip in ext Rotation.) Knee Immobilizer - Right: On at all times (can be out for PT) Restrictions Weight Bearing Restrictions: Yes RLE Weight Bearing: Non weight bearing    Mobility  Bed Mobility Overal bed mobility: Needs Assistance Bed Mobility: Supine to Sit     Supine to sit: Max assist     General bed mobility comments: Max assistance to move to edge of bed  this session.  pt required assistance to advance LEs to edge of bed and to elevate trunk into a seated position.    Transfers Overall transfer level: Needs assistance Equipment used:  (attempted sara stedy but BUEs and supporting LLE too weak to rise and lift bottom from bed.  Required face to face squat pivot from surface to surface.) Transfers: Sit to/from [employer] Sit to Stand: Total assist (unable to clear her bottom.)   Squat pivot transfers: Total assist     General transfer comment: Pt unsuccessful to move into standing.  Performed squat pivot from bed to recliner.  High to low surface.  Ambulation/Gait                 Stairs             Wheelchair Mobility    Modified Rankin (Stroke Patients Only)       Balance Overall balance assessment: Needs assistance   Sitting balance-Leahy Scale: Poor       Standing balance-Leahy Scale: Poor                              Cognition Arousal/Alertness: Awake/alert Behavior During Therapy: Anxious Overall Cognitive Status: Within Functional Limits for tasks assessed  Exercises      General Comments        Pertinent Vitals/Pain Pain Assessment: 0-10 Pain Score: 6  Pain Location: R LE Pain Descriptors / Indicators: Discomfort;Grimacing;Guarding Pain Intervention(s): Monitored during session;Repositioned    Home Living                      Prior Function            PT Goals (current goals can now be found in the care plan section) Acute Rehab PT Goals Potential to Achieve Goals: Fair Progress towards PT goals: Progressing toward goals    Frequency    Min 3X/week      PT Plan Current plan remains appropriate    Co-evaluation              AM-PAC PT "6 Clicks" Mobility   Outcome Measure  Help needed turning from your back to your side while in a flat bed without using bedrails?: A  Lot Help needed moving from lying on your back to sitting on the side of a flat bed without using bedrails?: A Lot Help needed moving to and from a bed to a chair (including a wheelchair)?: Total Help needed standing up from a chair using your arms (e.g., wheelchair or bedside chair)?: Total Help needed to walk in hospital room?: Total Help needed climbing 3-5 steps with a railing? : Total 6 Click Score: 8    End of Session Equipment Utilized During Treatment: Gait belt Activity Tolerance: Patient limited by pain Patient left: in bed;with bed alarm set;with call bell/phone within reach Nurse Communication: Mobility status PT Visit Diagnosis: Repeated falls (R29.6);Muscle weakness (generalized) (M62.81);Other abnormalities of gait and mobility (R26.89)     Time: [id number] PT Time Calculation (min) (ACUTE ONLY): 24 min  Charges:  $Therapeutic Activity: 23-37 mins                    Erasmo Leventhal , PTA Acute Rehabilitation Services Pager [phone] Office [phone]    Desma Wilkowski Eli Hose 07/14/2021, 1:08 PM

## 2021-07-14 NOTE — Progress Notes (Signed)
Occupational Therapy Treatment Patient Details Name: [name] [name] MRN: [id number] DOB: 1946/07/06 Today's Date: 07/14/2021    History of present illness 75 y.o. female admitted on 07/08/21 with R obliquely oriented intra-articular distal femur fracture, extending from the medial metadiaphysis through the intercondylar notch into the knee joint s/p fall. Pt. reports multiple falls over last 8 months due to weakness. CT head and C spine (-) for any acute findings. Underwent R distal femur ORIF on 07/10/21. PMH  type 2 diabetes, hypertension, anxiety, anemia, obesity (BMI 37.76), GERD, sleep apnea, colon CA, a-fib.  PLOF is mod IND with use of RW for household distances, assist with ADLs from sister.   OT comments  Darcy is incrementally progressing. Session focused on bed level ADLs as pt was wet upon arrival. Pt required max A for all rolling tasks and demonstrated good ability to assist by sustaining side-lying and had better ability when rolling to the R. Pt was generally max-total A for peri care as she was unable to reach with her RUE due to baseline ROM impairments. Pt placed in bone foam, unable to tolerate it due to pain. Pillows placed under distal LE, and supported to prevent ER. Pt continues to benefit from continued OT. D/c recommendation remains appropriate.    Follow Up Recommendations  SNF;Supervision/Assistance - 24 hour    Equipment Recommendations  Other (comment)       Precautions / Restrictions Precautions Precautions: Fall Precaution Comments: Rest with knee in extension only with knee immobilizer on and bone foam in place, okay to remove immobilizer for PT.  Unrestricted ROM for PT. Required Braces or Orthoses: Knee Immobilizer - Right Knee Immobilizer - Right: Other (comment) (secure chat order from Ainsley Spinner, PA pt does not have to be in [place] as long as she is in bone foam) Restrictions Weight Bearing Restrictions: Yes RLE Weight Bearing: Non weight bearing        Mobility Bed Mobility Overal bed mobility: Needs Assistance Bed Mobility: Rolling Rolling: Max assist         General bed mobility comments: max A for all bed mobility, pt is uable to manage RLE, and RUE ROM impairments make rolling toward the L difficult. Max A +2 to boost in the bed with use of pads    Transfers Overall transfer level: Needs assistance               General transfer comment: defered this session due to need for ADLs at bed level upon arrival        ADL either performed or assessed with clinical judgement   ADL Overall ADL's : Needs assistance/impaired                         Toilet Transfer: Total assistance Toilet Transfer Details (indicate cue type and reason): Pt wet in bed upon arrival Toileting- Clothing Manipulation and Hygiene: Maximal assistance;Bed level Toileting - Clothing Manipulation Details (indicate cue type and reason): Pt requierd max A for rolling in teh bed to assist with hygiene at bed level. Pt able to assist by reaching UE to pull trunk and sustain sidelying position     Functional mobility during ADLs: Maximal assistance (bed level) General ADL Comments: Session focused on bed level ADLs and mobility, Max A for all tasks. Max A +2 for scooting/boosting               Cognition Arousal/Alertness: Awake/alert Behavior During Therapy: Anxious Overall Cognitive Status: Within  Functional Limits for tasks assessed                                                General Comments no new concerns, pt cleaned and barrier cream applied to bottom per pt request. Pt very appreciative of therapy    Pertinent Vitals/ Pain       Pain Assessment: Faces Faces Pain Scale: Hurts little more Pain Location: R LE Pain Descriptors / Indicators: Discomfort;Grimacing;Guarding Pain Intervention(s): Limited activity within patient's tolerance;Monitored during session   Frequency  Min 2X/week         Progress Toward Goals  OT Goals(current goals can now be found in the care plan section)  Progress towards OT goals: Progressing toward goals  Acute Rehab OT Goals Patient Stated Goal: less pain OT Goal Formulation: With patient Time For Goal Achievement: 07/25/21 Potential to Achieve Goals: Fair ADL Goals Pt Will Perform Upper Body Bathing: with set-up;sitting Pt Will Perform Lower Body Bathing: with min assist;sit to/from stand Pt Will Perform Lower Body Dressing: with min assist;sit to/from stand Pt Will Transfer to Toilet: with min guard assist;stand pivot transfer;bedside commode Pt Will Perform Toileting - Clothing Manipulation and hygiene: with min assist;sit to/from stand  Plan Discharge plan remains appropriate       AM-PAC OT "6 Clicks" Daily Activity     Outcome Measure   Help from another person eating meals?: A Little Help from another person taking care of personal grooming?: A Little Help from another person toileting, which includes using toliet, bedpan, or urinal?: A Lot Help from another person bathing (including washing, rinsing, drying)?: A Lot Help from another person to put on and taking off regular upper body clothing?: A Little Help from another person to put on and taking off regular lower body clothing?: Total 6 Click Score: 14    End of Session Equipment Utilized During Treatment: Oxygen  OT Visit Diagnosis: Other abnormalities of gait and mobility (R26.89);Unsteadiness on feet (R26.81);Repeated falls (R29.6);Muscle weakness (generalized) (M62.81);Pain   Activity Tolerance Patient limited by pain   Patient Left in bed;with call bell/phone within reach;with bed alarm set   Nurse Communication Mobility status;Precautions;Weight bearing status        Time: [id number] OT Time Calculation (min): 19 min  Charges: OT General Charges $OT Visit: 1 Visit OT Treatments $Self Care/Home Management : 8-22 mins    Sydelle Sherfield A Cara Aguino 07/14/2021, 5:06  PM

## 2021-07-14 NOTE — Care Management Important Message (Signed)
Important Message  Patient Details  Name: [name] [name] MRN: [id number] Date of Birth: [birth year]   Medicare Important Message Given:  Yes     [name] 07/14/2021, 4:27 PM

## 2021-07-14 NOTE — Progress Notes (Signed)
Subjective: I seen and evaluated [name] [name] at bedside.  She stated that she did not sleep very well last night due to being hungry.  She stated that she refused to eat yesterday evening because she scooped too much food onto the spoon, had too much food in her mouth and coughed it out.  She was worried about choking so she decided not to eat.  This morning she reports an appetite and was currently waiting for breakfast.  She denies shortness of breath, nausea, vomiting, or abdominal pain.  She states that she has not had a bowel movement today and is still straining when she attempts. She continues to state that her right leg still feels very heavy when attempting to move.  She denies any pain at this time and does not wish to adjust her pain medication.   Objective:  Vital signs in last 24 hours: Vitals:   07/13/21 2200 07/14/21 0548 07/14/21 0549 07/14/21 1248  BP: 139/74 124/60 124/60 122/71  Pulse: 80 80 80 72  Resp: '18  18 17  '$ Temp: 97.6 F (36.4 C) 98.1 F (36.7 C) 98.1 F (36.7 C) 98.4 F (36.9 C)  TempSrc: Oral Oral Oral Oral  SpO2: 99% 95% 95% 97%  Weight:      Height:       Physical Exam Constitutional:      General: She is awake.  HENT:     Head: Normocephalic and atraumatic.  Cardiovascular:     Rate and Rhythm: Normal rate.     Pulses:          Radial pulses are 2+ on the right side and 2+ on the left side.       Dorsalis pedis pulses are 2+ on the right side and 2+ on the left side.     Heart sounds: Normal heart sounds.  Pulmonary:     Effort: Pulmonary effort is normal.     Comments: Auscultated lungs anteriorly Abdominal:     General: Bowel sounds are normal.     Palpations: Abdomen is soft.     Tenderness: There is no abdominal tenderness.  Musculoskeletal:     Right lower leg: No edema.     Left lower leg: No edema.     Comments: Bandage present on right lower medial thigh covering incision site. Bilateral legs are warm to touch, sensation  intact.  Right knee- minimal swelling, warm to touch, no pain to palpation    Skin:    General: Skin is warm and dry.  Neurological:     General: No focal deficit present.     Mental Status: She is alert.  Psychiatric:        Behavior: Behavior is cooperative.     Assessment/Plan:  Active Problems:   Femur fracture, right (HCC)  # Distal Femur Fracture, right # Osteoporosis  POD-4 of ORIF Currently awaiting SNF placement at [facility]. Patient is still complaining that her leg feels heavy with movement.  I was able to observe her flex at the hip slightly, she does not endorse pain with that movement, just a heavy sensation. PT has seen her today and noted she still requires significant assistance to mobilize from the bed to recliner.   --- Continue Eliquis 5 mg twice daily --- Tylenol 1000 mg every 8 hours for pain control --- Tramadol 50 mg every 6 hours for pain control   # Atrial Fibrillation  - Amiodarone 200 mg twice daily - Continue Eliquis 5 mg twice  daily.   # Type 2 Diabetes  Currently managed with diet only. Patient states she was previously on insulin but was taken off due to hypoglycemia in March 2022.  She checks her sugars at home twice daily; A1c was 5.4% in July 2022.  Current CBGs <150.  - SSI, moderate scale   # Anxiety  Depression  - Restart home fluoxetine --- Alprazolam 0.25 twice daily as needed  Prior to Admission Living Arrangement: Anticipated Discharge Location: Barriers to Discharge: Dispo: Anticipated discharge in approximately 1-2 day(s).   Timothy Lasso, MD 07/14/2021, 1:19 PM Pager: [phone] After 5pm on weekdays and 1pm on weekends: On Call pager [phone]

## 2021-07-14 NOTE — TOC Progression Note (Signed)
Transition of Care [facility]) - Progression Note    Patient Details  Name: [name] [name] MRN: [id number] Date of Birth: 02/18/1946  [facility] CM/SW Contact  [name] [name], [name] [name] Phone Number: 07/14/2021, 9:32 AM  Clinical Narrative:    08:32-  CSW was contacted by [name] [name] at [facility] and informed that the patient will have to quarantine for 10 days once at the facility because the patient has not received any COVID vaccinations.  The patient will receive Physical Therapy in the room while quarantined.    09:37-  CSW received a call from the patient's sister, [name] [name], requesting an update on SNF placement.  CSW informed [name] [name] that insurance authorization is pending.    Pending: insurance auth.   Expected Discharge Plan: [place]   Expected Discharge Plan and Services Expected Discharge Plan: [place] In-house Referral: Clinical Social Work     Living arrangements for the past 2 months: Mobile Home                                       Social Determinants of Health (SDOH) Interventions    Readmission Risk Interventions No flowsheet data found.

## 2021-07-15 ENCOUNTER — Inpatient Hospital Stay (HOSPITAL_COMMUNITY): Payer: Medicare HMO

## 2021-07-15 DIAGNOSIS — F419 Anxiety disorder, unspecified: Secondary | ICD-10-CM | POA: Diagnosis not present

## 2021-07-15 DIAGNOSIS — F32A Depression, unspecified: Secondary | ICD-10-CM | POA: Diagnosis not present

## 2021-07-15 DIAGNOSIS — E119 Type 2 diabetes mellitus without complications: Secondary | ICD-10-CM | POA: Diagnosis not present

## 2021-07-15 DIAGNOSIS — I4891 Unspecified atrial fibrillation: Secondary | ICD-10-CM | POA: Diagnosis not present

## 2021-07-15 LAB — URINALYSIS, COMPLETE (UACMP) WITH MICROSCOPIC
Bilirubin Urine: NEGATIVE
Glucose, UA: NEGATIVE mg/dL
Hgb urine dipstick: NEGATIVE
Ketones, ur: NEGATIVE mg/dL
Leukocytes,Ua: NEGATIVE
Nitrite: NEGATIVE
Protein, ur: NEGATIVE mg/dL
Specific Gravity, Urine: 1.014 (ref 1.005–1.030)
pH: 5 (ref 5.0–8.0)

## 2021-07-15 LAB — BASIC METABOLIC PANEL
Anion gap: 6 (ref 5–15)
BUN: 21 mg/dL (ref 8–23)
CO2: 25 mmol/L (ref 22–32)
Calcium: 8.3 mg/dL — ABNORMAL LOW (ref 8.9–10.3)
Chloride: 97 mmol/L — ABNORMAL LOW (ref 98–111)
Creatinine, Ser: 0.75 mg/dL (ref 0.44–1.00)
GFR, Estimated: >60 mL/min (ref >60)
Glucose, Bld: 148 mg/dL — ABNORMAL HIGH (ref 70–99)
Potassium: 4.6 mmol/L (ref 3.5–5.1)
Sodium: 128 mmol/L — ABNORMAL LOW (ref 135–145)

## 2021-07-15 LAB — GLUCOSE, CAPILLARY
Glucose-Capillary: 116 mg/dL — ABNORMAL HIGH (ref 70–99)
Glucose-Capillary: 190 mg/dL — ABNORMAL HIGH (ref 70–99)
Glucose-Capillary: 144 mg/dL — ABNORMAL HIGH (ref 70–99)
Glucose-Capillary: 112 mg/dL — ABNORMAL HIGH (ref 70–99)
Glucose-Capillary: 157 mg/dL — ABNORMAL HIGH (ref 70–99)

## 2021-07-15 LAB — SARS CORONAVIRUS 2 (TAT 6-24 HRS): SARS Coronavirus 2: NEGATIVE

## 2021-07-15 IMAGING — CR DG PELVIS 1-2V
1 series · 1 of 1 positions shown · non-contrast
Comparison: [DATE]

CLINICAL DATA: Weakness

EXAM:
PELVIS - 1-2 VIEW

[pelvis ap]
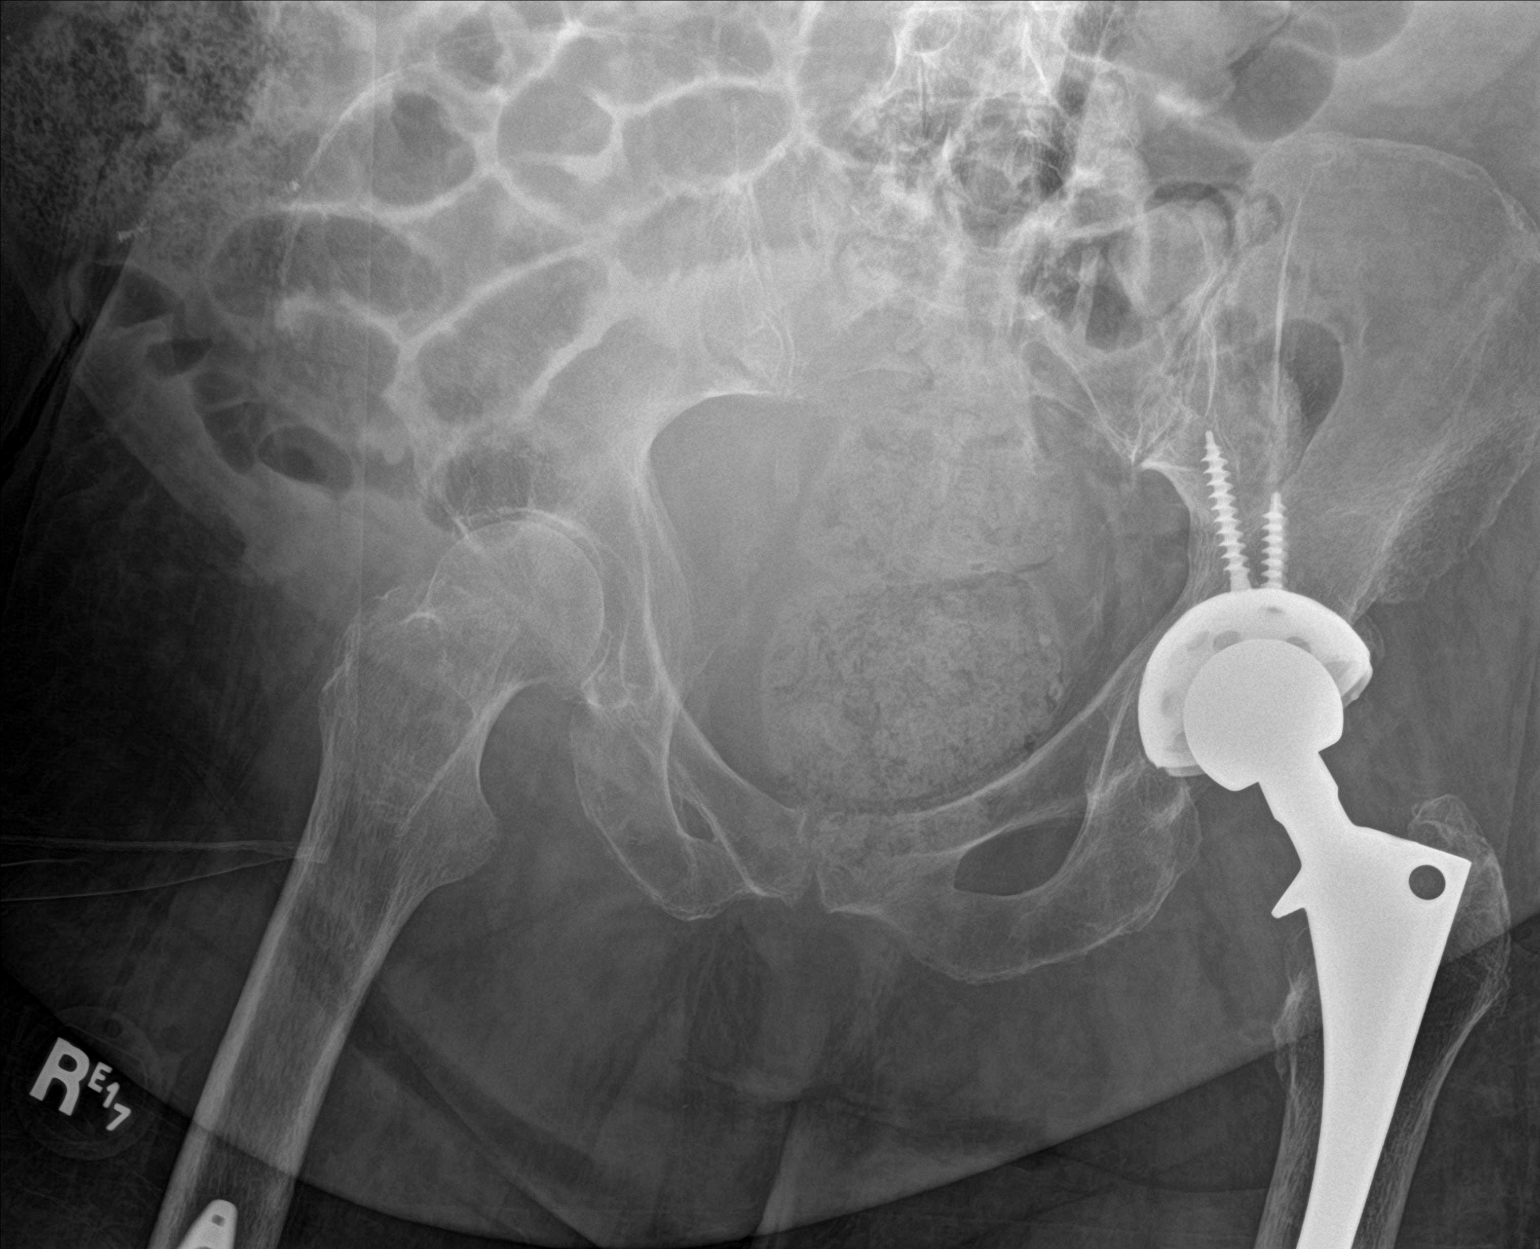

[1 of 1 positions shown; findings below may reference images not displayed]

FINDINGS: Left hip replacement with intact hardware and normal alignment.
Pubic symphysis and rami are intact. Right femoral head projects in
joint.
IMPRESSION: Left hip replacement.  No definite acute osseous abnormality

## 2021-07-15 IMAGING — CR DG LUMBAR SPINE COMPLETE 4+V
5 series · 5 of 5 positions shown · non-contrast
Comparison: None.

CLINICAL DATA: Weakness

EXAM:
LUMBAR SPINE - COMPLETE 4+ VIEW

[l-spine ap]
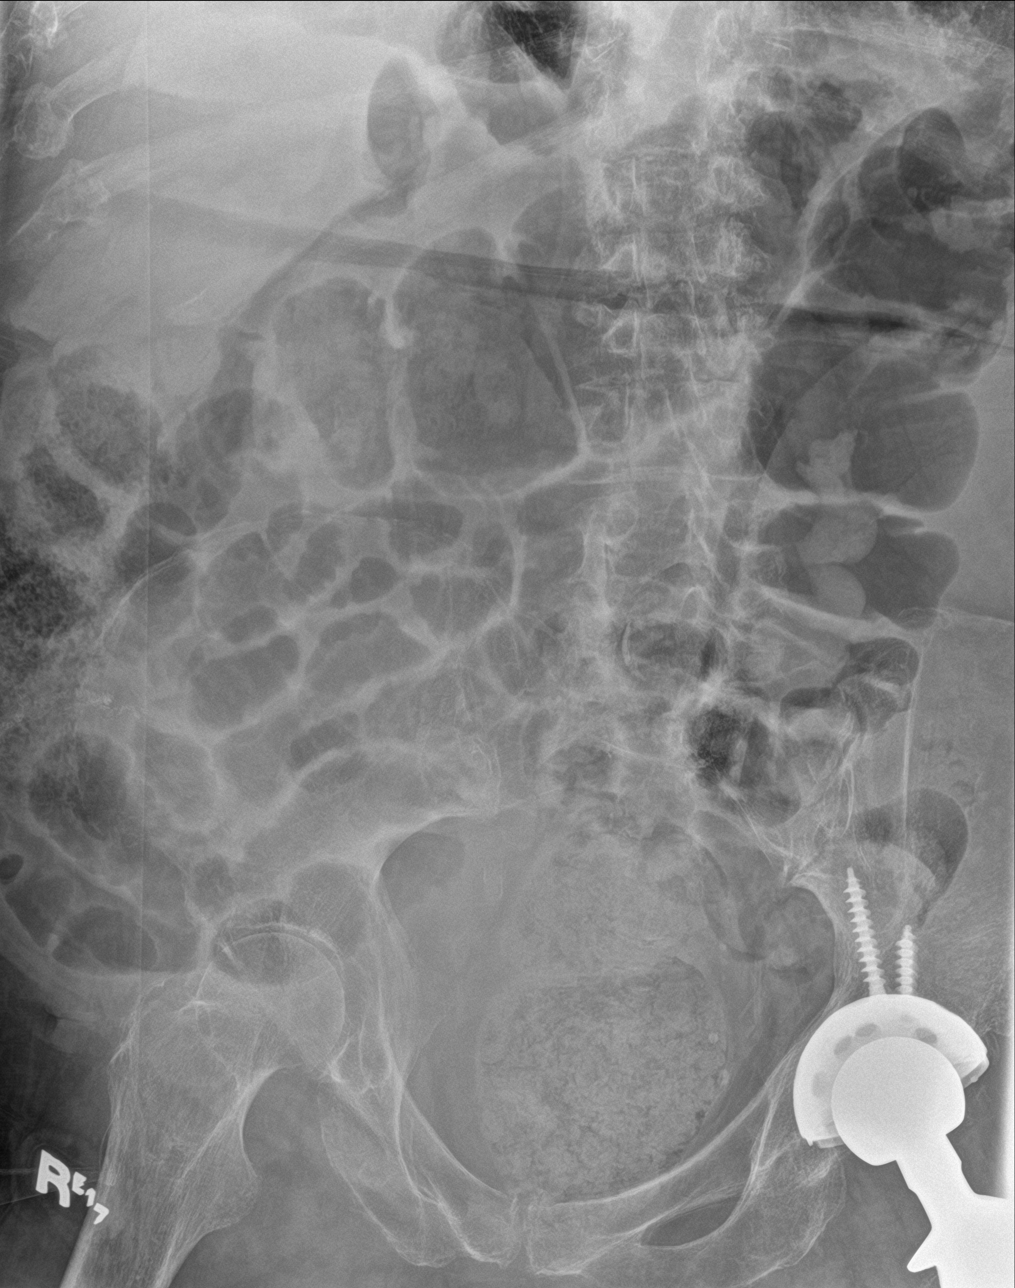

[l-spine obl (1 of 2)]
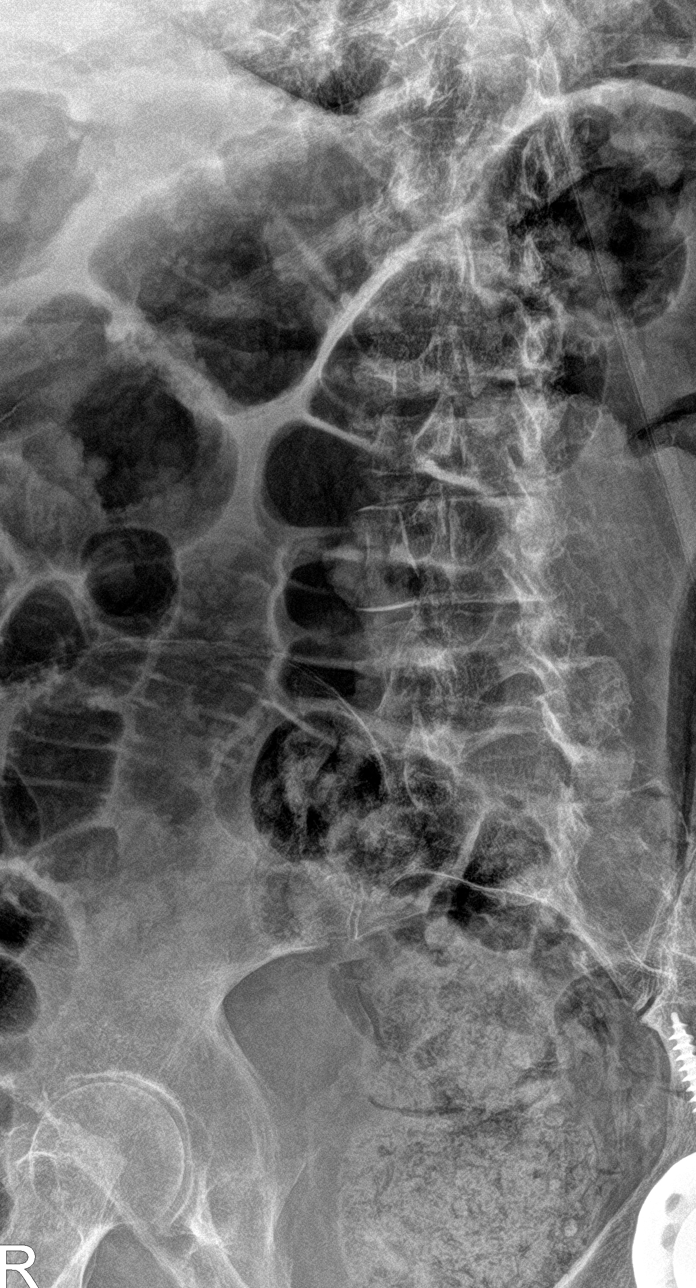

[l-spine obl (2 of 2)]
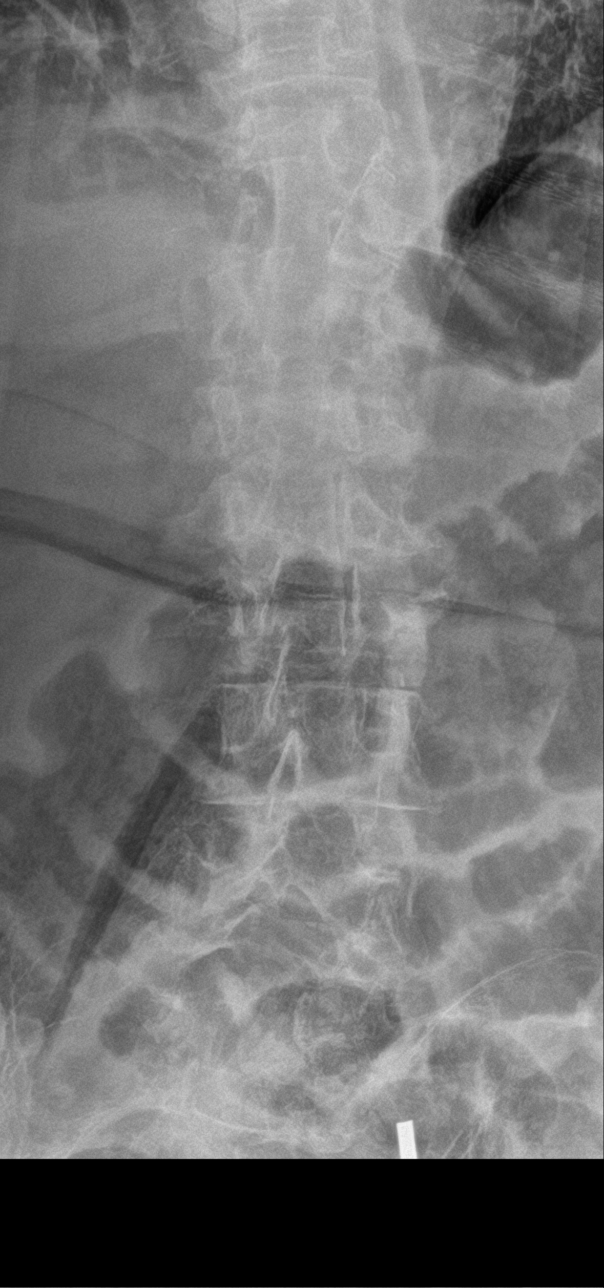

[l-spine spot]
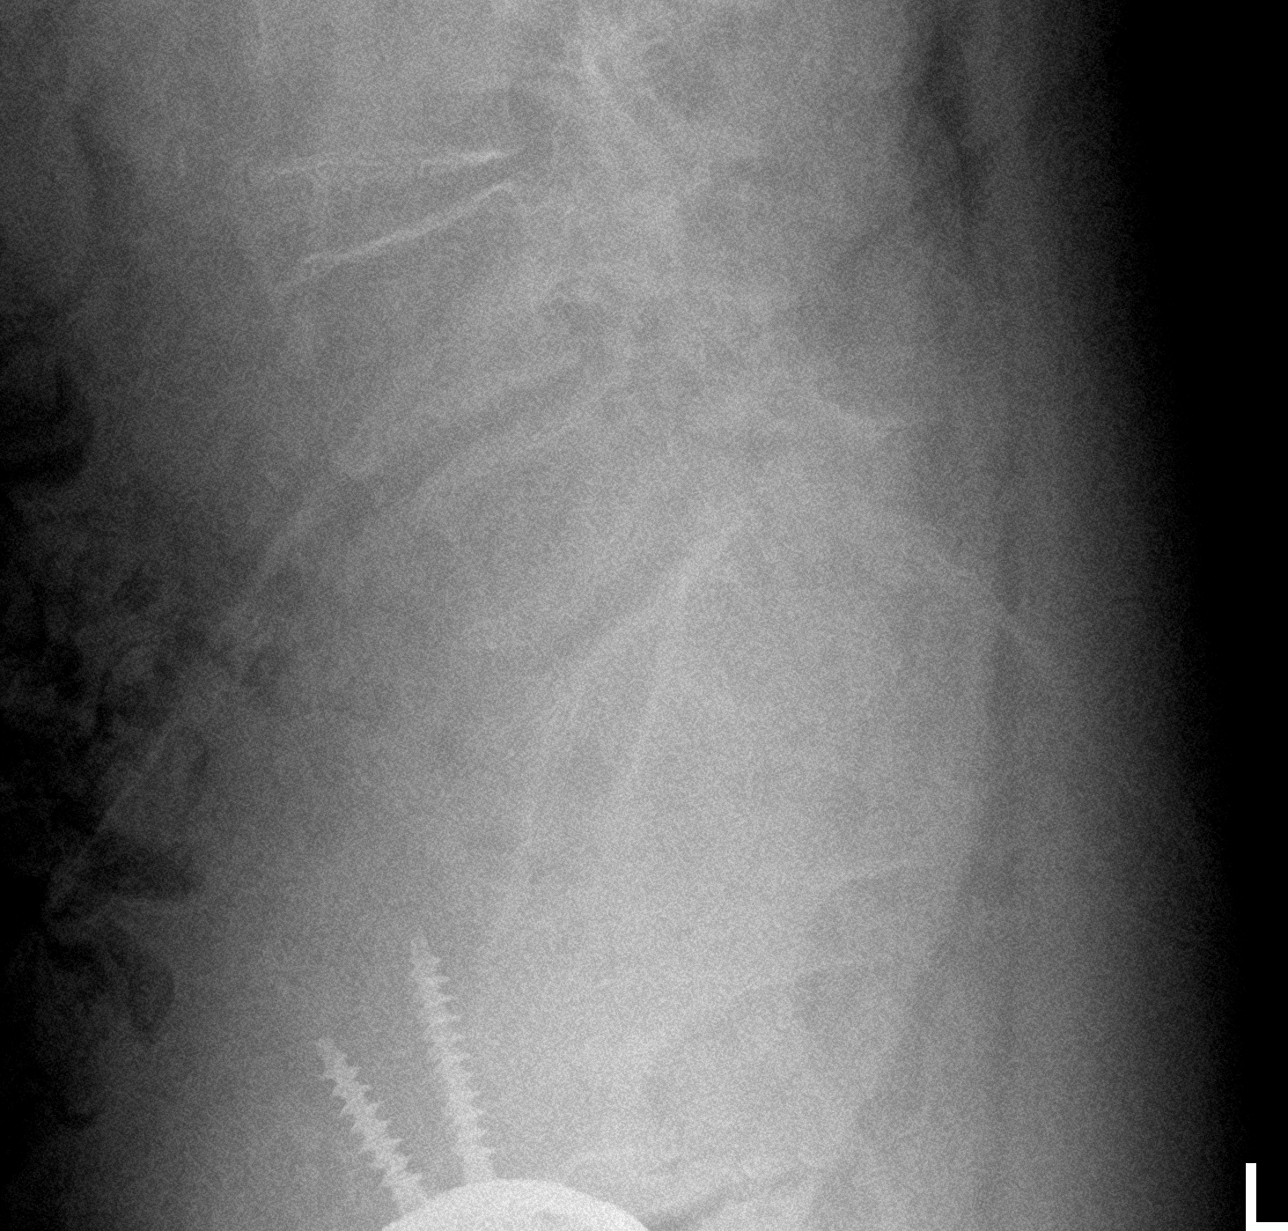

[l-spine lat]
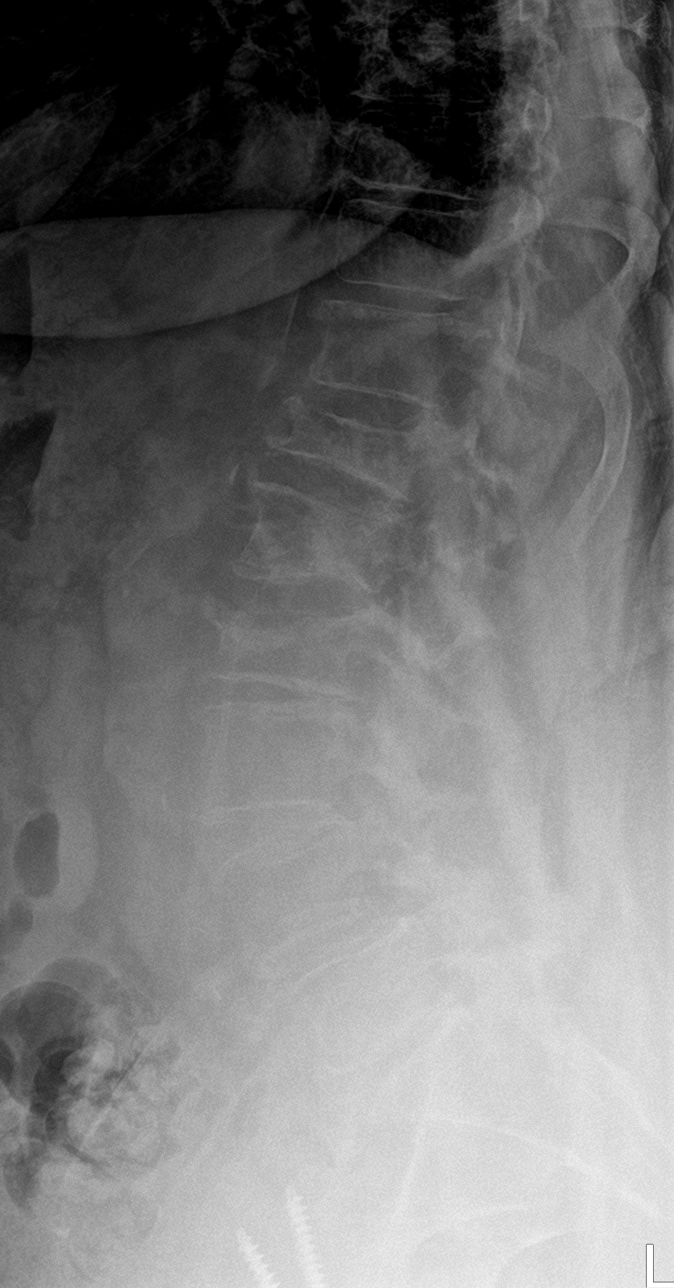

[5 of 5 positions shown; findings below may reference images not displayed]

FINDINGS: Limited by habitus and positioning. Partially visualized left hip
replacement. Mild diffuse increased small and large bowel gas
possible ileus. Moderate retained feces at the rectum. Mild
scoliosis. Grade 1 anterolisthesis L4 on L5. Moderate severe
compression fractures T12 and L2 of uncertain chronicity. Mild
compression fracture L1 involves inferior endplate, there may be
sclerosis at the endplate. Mild diffuse degenerative changes
throughout the lumbar spine with disc space narrowing and facet
degenerative changes.
IMPRESSION: 1. Moderate compression fractures at T12 and L2 with mild
compression fracture at L1, uncertain age.
2. Degenerative changes and scoliosis
3. Mild diffuse increased small and large bowel gas, question ileus

## 2021-07-15 IMAGING — CR DG CERVICAL SPINE COMPLETE 4+V
5 series · 5 of 5 positions shown · non-contrast
Comparison: CT [DATE]

CLINICAL DATA: Weakness

EXAM:
CERVICAL SPINE - COMPLETE 4+ VIEW

[c-spine lat]
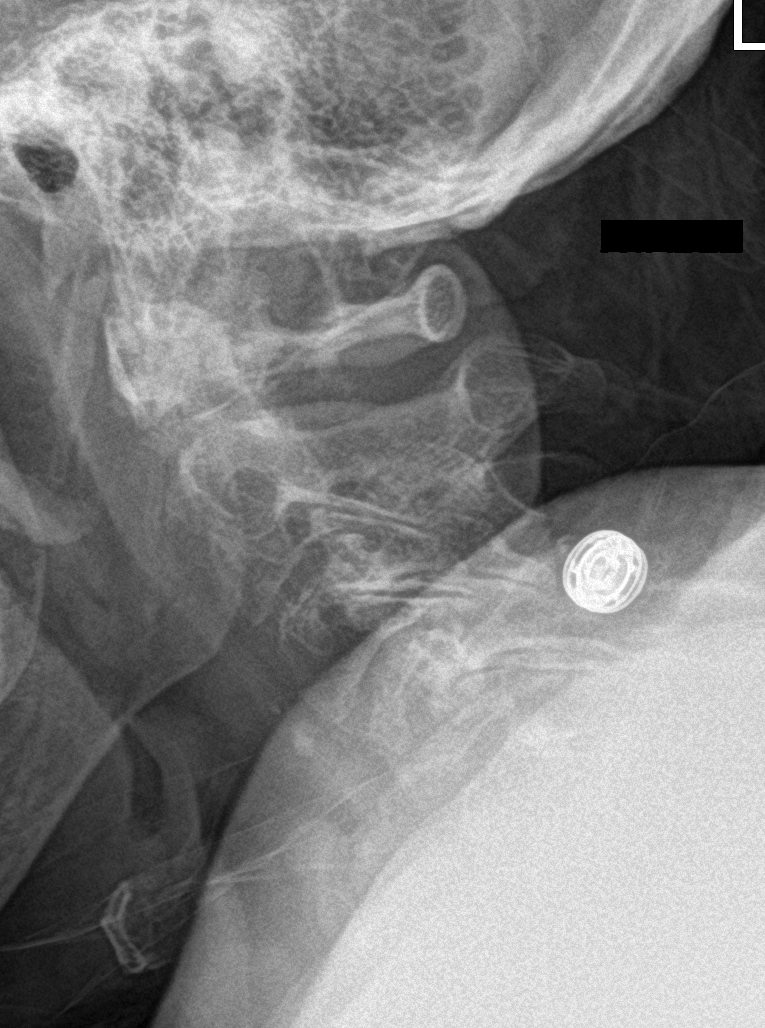

[c-spine obl (1 of 2)]
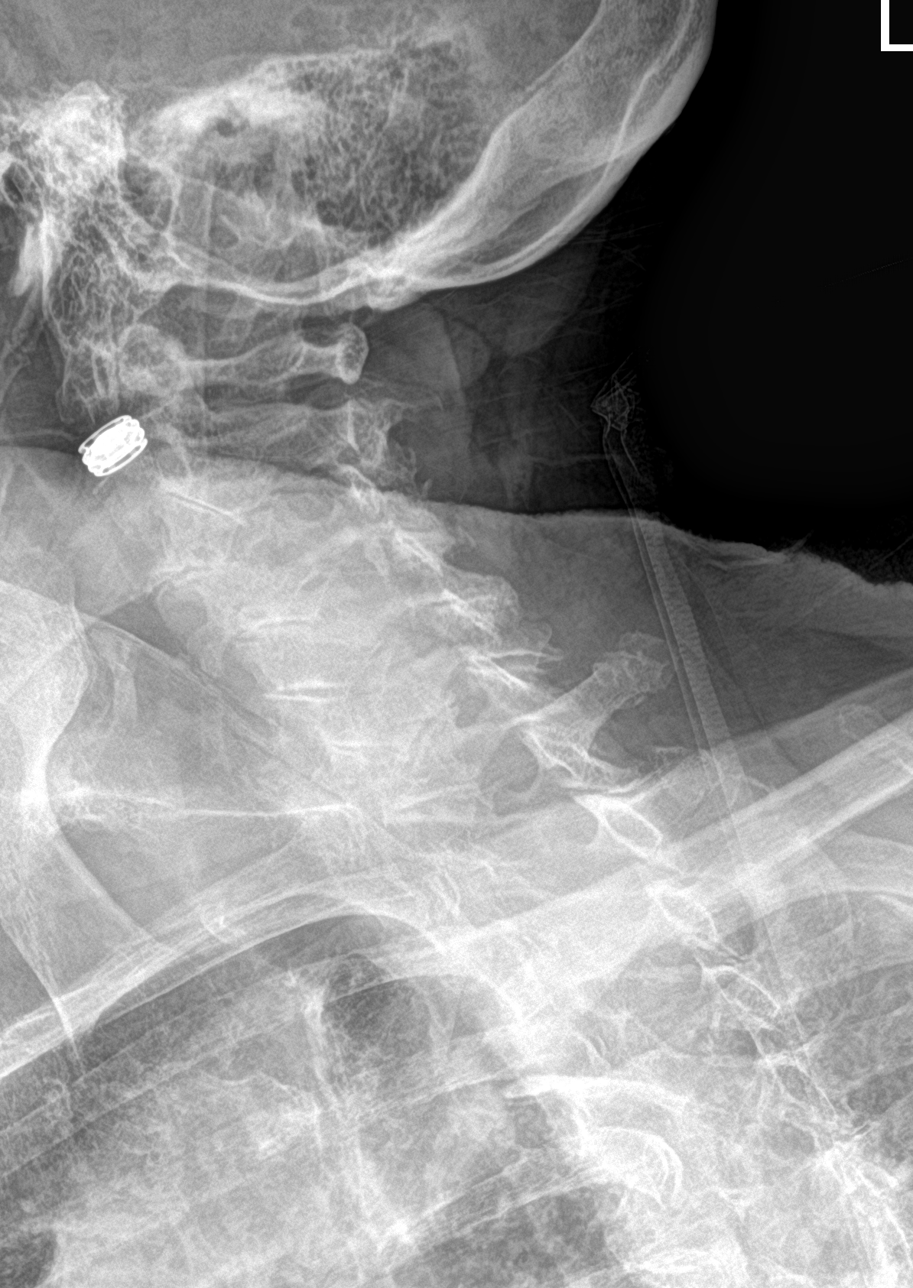

[c-spine obl (2 of 2)]
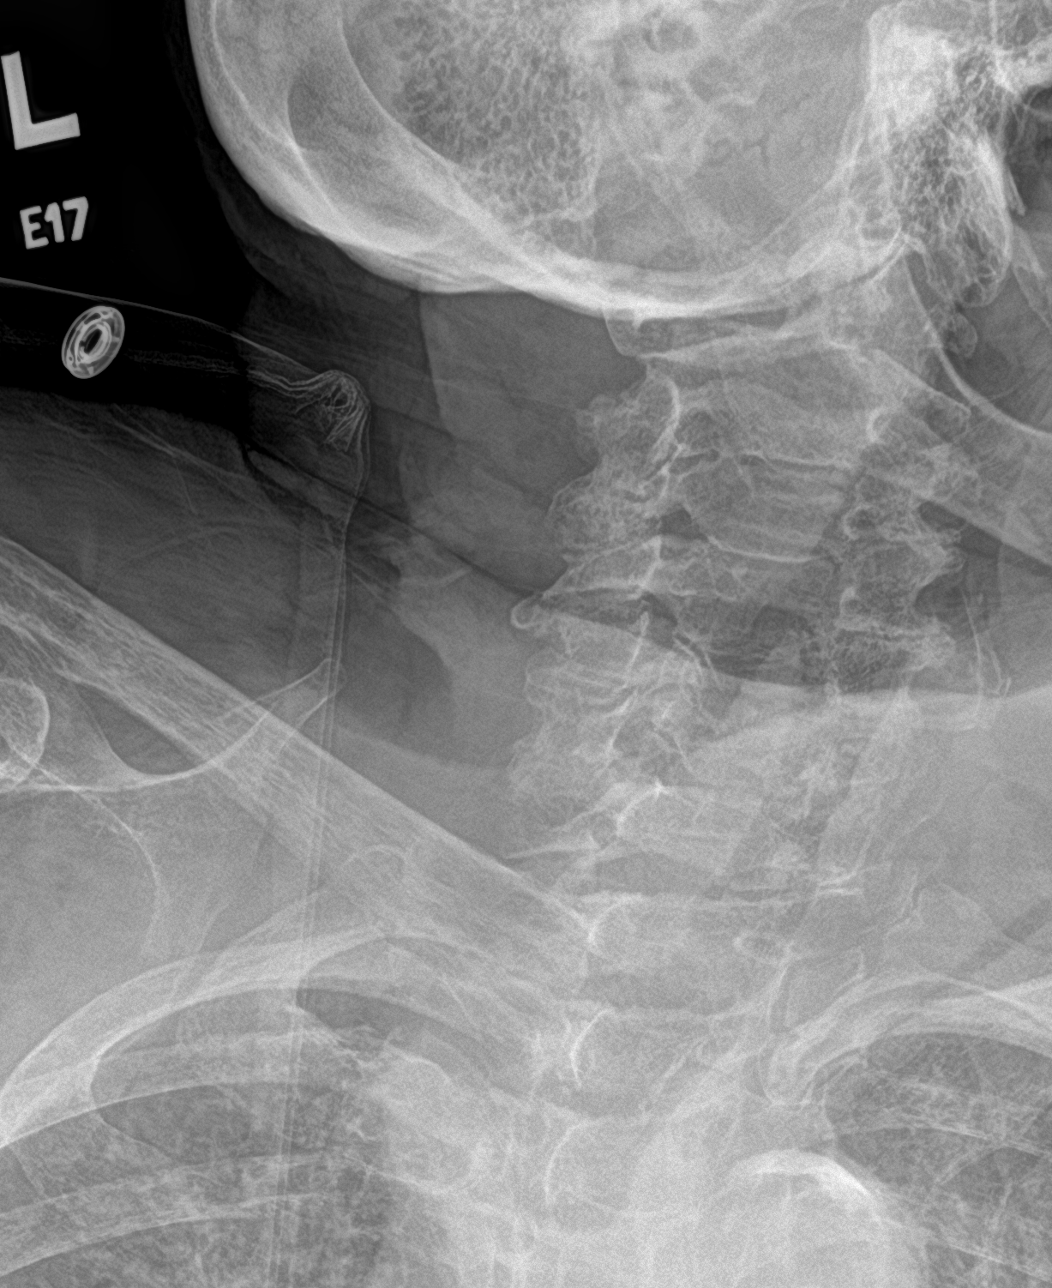

[c-spine ap]
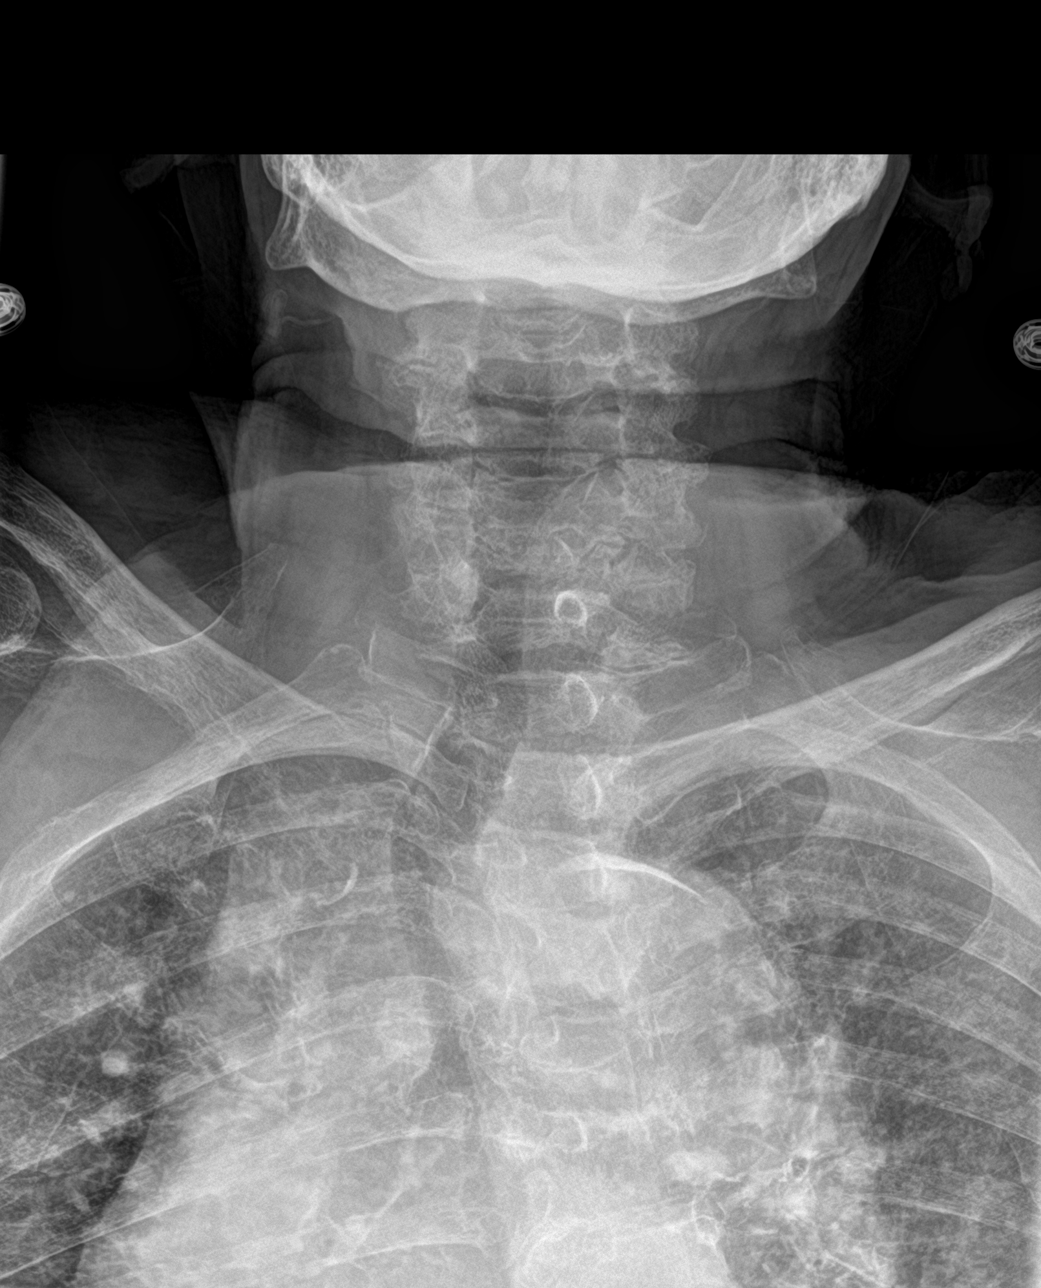

[c-spine open mouth]
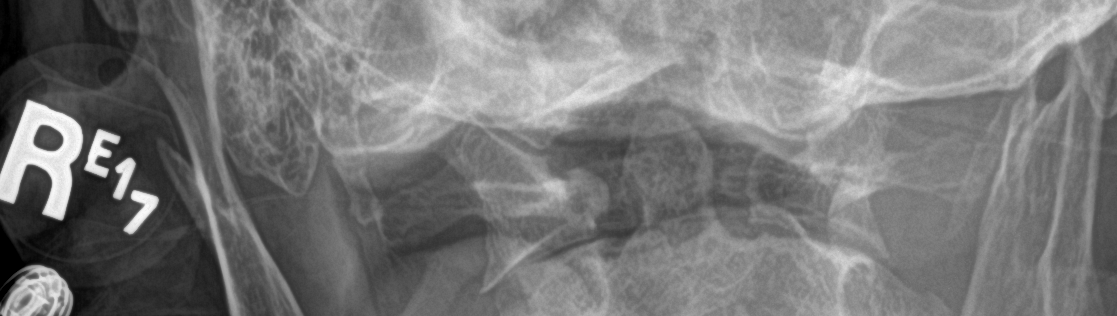

[5 of 5 positions shown; findings below may reference images not displayed]

FINDINGS: Limited by habitus and positioning. Inadequate visualization C5 and
below. Dens and lateral masses are grossly unremarkable. Disc space
narrowing and degenerative change at C2-C3, C3-C4 and C4-C5. Facet
degenerative changes at multiple levels
IMPRESSION: Inadequate visualization C5 and below.  Degenerative changes.

## 2021-07-15 NOTE — Progress Notes (Signed)
  Date: 07/15/2021  Patient name: [name] [name]  Medical record number: [id number]  Date of birth: February 10, 1946        I have seen and evaluated this patient and I have discussed the plan of care with Dr Darrick Meigs. Please see their note for complete details. I concur with their findings with the following additions/corrections: as noted overall plan remains to discharge to SNF for continued PT.  After rounds she did bring to our attention some RUE weakness that has been present for some time but does not appear to have been previously evaluated this admission or in outpatient notes.  Agree with MRI and plan as outlined in Dr Chase Picket note  Lucious Groves, DO 07/15/2021, 4:34 PM

## 2021-07-15 NOTE — Progress Notes (Signed)
Orthopaedic Trauma Service Progress Note  Patient ID: [name] [name] MRN: [id number] DOB/AGE: 1945/12/25 75 y.o.  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 She has worn a lift in her R shoe in the past but does not now   ROS As above  Objective:   VITALS:   Vitals:   07/14/21 1248 07/14/21 2102 07/15/21 0538 07/15/21 0754  BP: 122/71 121/71 109/64 109/60  Pulse: 72 80 85 76  Resp: '17 18 18 15  '$ Temp: 98.4 F (36.9 C) 97.9 F (36.6 C) 98.3 F (36.8 C) 97.7 F (36.5 C)  TempSrc: Oral Oral Oral   SpO2: 97% 100% 97% 97%  Weight:      Height:        Estimated body mass index is 36.91 kg/m as calculated from the following:   Height as of this encounter: 5' (1.524 m).   Weight as of this encounter: 85.7 kg.   Intake/Output      08/16 0701 08/17 0700 08/17 0701 08/18 0700   P.O.  240   I.V. (mL/kg)     Total Intake(mL/kg)  240 (2.8)   Urine (mL/kg/hr) 500 (0.2)    Stool 0    Total Output 500    Net -500 +240        Urine Occurrence 2 x    Stool Occurrence 3 x      LABS  Results for orders placed or performed during the hospital encounter of 07/08/21 (from the past 24 hour(s))  Glucose, capillary     Status: Abnormal   Collection Time: 07/14/21 11:44 AM  Result Value Ref Range   Glucose-Capillary 122 (H) 70 - 99 mg/dL  Glucose, capillary     Status: Abnormal   Collection Time: 07/14/21  4:11 PM  Result Value Ref Range   Glucose-Capillary 160 (H) 70 - 99 mg/dL  Glucose, capillary     Status: Abnormal   Collection Time: 07/14/21  9:00 PM  Result Value Ref Range   Glucose-Capillary 149 (H) 70 - 99 mg/dL  Glucose, capillary     Status: Abnormal   Collection Time: 07/15/21  5:33 AM  Result Value Ref Range    Glucose-Capillary 116 (H) 70 - 99 mg/dL  Basic metabolic panel     Status: Abnormal   Collection Time: 07/15/21  6:35 AM  Result Value Ref Range   Sodium 128 (L) 135 - 145 mmol/L   Potassium 4.6 3.5 - 5.1 mmol/L   Chloride 97 (L) 98 - 111 mmol/L   CO2 25 22 - 32 mmol/L   Glucose, Bld 148 (H) 70 - 99 mg/dL   BUN 21 8 - 23 mg/dL   Creatinine, Ser 0.75 0.44 - 1.00 mg/dL   Calcium 8.3 (L) 8.9 - 10.3 mg/dL   GFR, Estimated >60 >60 mL/min   Anion gap 6 5 - 15  Glucose, capillary     Status: Abnormal   Collection Time: 07/15/21  8:02 AM  Result Value Ref Range   Glucose-Capillary 190 (H) 70 - 99 mg/dL     PHYSICAL EXAM:   Gen: sitting up in  bed, NAD Ext:        Right Lower Extremity               Dressings clean, dry and intact             Ext warm              Motor and sensory functions intact              + DP pulse         Left Lower extremity   No gross findings on visual inspection   No swelling    No instability with manipulation of her left leg  Tremendous difficulty with active motion of her left leg   Ext warm   + DP Pulse  + babinski reflex---> great toe extension with fanning of lesser toes   Heel cord is tight, I can get her to maybe neutral ankle extension passively   Assessment/Plan: 5 Days Post-Op     Anti-infectives (From admission, onward)    Start     Dose/Rate Route Frequency Ordered Stop   07/10/21 1640  ceFAZolin (ANCEF) IVPB 2g/100 mL premix        2 g 200 mL/hr over 30 Minutes Intravenous Every 8 hours 07/10/21 1321 07/11/21 0535   07/10/21 0719  ceFAZolin (ANCEF) 2-4 GM/100ML-% IVPB       Note to Pharmacy: Valda Lamb   : cabinet override      07/10/21 0719 07/10/21 1929   07/10/21 0700  ceFAZolin (ANCEF) IVPB 2g/100 mL premix        2 g 200 mL/hr over 30 Minutes Intravenous  Once 07/09/21 1547 07/10/21 2255     .  POD/HD#: 45  75 y/o female s/p fall with R supracondylar distal femur fracture    -fall   - R distal femur fracture  s/p ORIF              NWB R leg             Unrestricted ROM R knee                         Dc knee immobilizer              PT/OT             Ice and elevate                          PT- please teach HEP for R knee ROM- AROM, PROM. Prone exercises as well. No ROM restrictions.  Quad sets, SLR, LAQ, SAQ, heel slides, stretching, prone flexion and extension   Ankle theraband program, heel cord stretching, toe towel curls, etc   No pillows under bend of knee when at rest, ok to place under heel to help work on extension. Can also use zero knee bone foam if available   - L leg weakness  Not really sure what to make of her exam   No history of stroke  Babinski noted on exam    Check xrays of C-spine and L-spine    Further imaging w/u tbd   - Pain management:             Multimodal   - DVT/PE prophylaxis:             Home eliquis  - ID:  Periop abx completed    - Metabolic Bone Disease:             Vitamin d deficiency                          Supplement               H/o osteoporosis---. Most recent dexa 2017 T score -2.6   - Activity:             Up with assistance             NWB R leg    - Impediments to fracture healing:             Vitamin d deficiency              H/o falls             Poor bone quality/osteoporosis     - Dispo:             Therapy evals             TOC consult for SNF             Ortho issues stable    Jari Pigg, PA-C [phone] (C) 07/15/2021, 11:19 AM  [facility] [place] [address] [phone] Jenetta [phone] (F)    After 5pm and on the weekends please log on to Amion, go to orthopaedics and the look under the [facility] Call for the provider(s) on call. You can also call our office at [phone] and then follow the prompts to be connected to the call team.

## 2021-07-15 NOTE — Plan of Care (Signed)
VSS. C/o left sided pain under breast, provider aware, Voltaren gel and heat pack applied. EKG obtained. Repositioned for comfort. 2 assist for turns. Purewick in place. Call bell within reach. Bed alarm on.   Problem: Clinical Measurements: Goal: Ability to maintain clinical measurements within normal limits will improve Outcome: Progressing Goal: Will remain free from infection Outcome: Progressing Goal: Diagnostic test results will improve Outcome: Progressing Goal: Respiratory complications will improve Outcome: Progressing Goal: Cardiovascular complication will be avoided Outcome: Progressing   Problem: Nutrition: Goal: Adequate nutrition will be maintained Outcome: Progressing   Problem: Elimination: Goal: Will not experience complications related to bowel motility Outcome: Progressing Goal: Will not experience complications related to urinary retention Outcome: Progressing   Problem: Pain Managment: Goal: General experience of comfort will improve Outcome: Progressing   Problem: Safety: Goal: Ability to remain free from injury will improve Outcome: Progressing   Problem: Skin Integrity: Goal: Risk for impaired skin integrity will decrease Outcome: Progressing

## 2021-07-15 NOTE — Progress Notes (Signed)
Physical Therapy Treatment Patient Details Name: [name] [name] MRN: [id number] DOB: 06-Jan-1946 Today's Date: 07/15/2021    History of Present Illness 75 y.o. female admitted on 07/08/21 with R obliquely oriented intra-articular distal femur fracture, extending from the medial metadiaphysis through the intercondylar notch into the knee joint s/p fall. Pt. reports multiple falls over last 8 months due to weakness. CT head and C spine (-) for any acute findings. Underwent R distal femur ORIF on 07/10/21. PMH  type 2 diabetes, hypertension, anxiety, anemia, obesity (BMI 37.76), GERD, sleep apnea, colon CA, a-fib.  PLOF is mod IND with use of RW for household distances, assist with ADLs from sister.    PT Comments    Focused session on attempting to decrease pt's fear of falling and thus improve her independence with transfers. Pt tends to lean posteriorly when she needs to lean anteriorly to perform transfers, placing her at increased risk for falls. She continues to need maxAx2 for lateral scooting transfers. Pt also rests with her R hip externally rotated and knee flexed, thus performed exercises to address leg weakness and ROM deficits. Will continue to follow acutely. Current recommendations remain appropriate.   Follow Up Recommendations  SNF     Equipment Recommendations  Wheelchair (measurements PT);Wheelchair cushion (measurements PT)    Recommendations for Other Services       Precautions / Restrictions Precautions Precautions: Fall Precaution Comments: Rest with knee in extension only with knee immobilizer on and bone foam in place, okay to remove immobilizer for PT.  Unrestricted ROM for PT. Required Braces or Orthoses: Knee Immobilizer - Right Knee Immobilizer - Right: Other (comment) (secure chat order from Ainsley Spinner, [place] pt does not have to be in [place] as long as she is in bone foam) Restrictions Weight Bearing Restrictions: Yes RLE Weight Bearing: Non weight bearing     Mobility  Bed Mobility Overal bed mobility: Needs Assistance Bed Mobility: Supine to Sit     Supine to sit: Mod assist;+2 for physical assistance;HOB elevated     General bed mobility comments: Cues to bring legs off EOB and use bed rails to pull trunk up to sit, modAx2 to manage R leg, pivot buttocks, and ascend trunk.    Transfers Overall transfer level: Needs assistance Equipment used: 1 person hand held assist Transfers: Lateral/Scoot Transfers          Lateral/Scoot Transfers: Max assist;+2 physical assistance;+2 safety/equipment;From elevated surface General transfer comment: Cues to lean anteriorly, keep L foot on ground, and lift R leg to scoot on EOB laterally towards R and scoot from elevated EOB > recliner with L arm rest dropped, maxAx2 as pt continued to lean posteriorly and intermittently lift L foot each attempt to scoot.  Ambulation/Gait             General Gait Details: Unable this date.   Stairs             Wheelchair Mobility    Modified Rankin (Stroke Patients Only) Modified Rankin (Stroke Patients Only) Pre-Morbid Rankin Score: Moderate disability Modified Rankin: Severe disability     Balance Overall balance assessment: Needs assistance Sitting-balance support: Feet supported;Bilateral upper extremity supported Sitting balance-Leahy Scale: Poor Sitting balance - Comments: Min-modA posteriorly for support as pt has posterior bias. Postural control: Posterior lean     Standing balance comment: Unable to stand at this time.  Cognition Arousal/Alertness: Awake/alert Behavior During Therapy: Anxious Overall Cognitive Status: Within Functional Limits for tasks assessed                                 General Comments: Pt with fear of falling, leaning posteriorly each attempt to transfer.      Exercises General Exercises - Lower Extremity Long Arc Quad: AAROM;Right;10  reps;Seated Heel Slides: AAROM;Right;10 reps;Seated Hip Flexion/Marching: AAROM;Right;10 reps;Seated Other Exercises Other Exercises: AAROM R hip internal rotation sitting reclined in chair, x10    General Comments        Pertinent Vitals/Pain Pain Assessment: Faces Faces Pain Scale: Hurts little more Pain Location: R LE Pain Descriptors / Indicators: Discomfort;Grimacing;Guarding Pain Intervention(s): Limited activity within patient's tolerance;Repositioned;Monitored during session    Home Living                      Prior Function            PT Goals (current goals can now be found in the care plan section) Acute Rehab PT Goals Patient Stated Goal: to get stronger PT Goal Formulation: With patient/family Time For Goal Achievement: 07/25/21 Potential to Achieve Goals: Fair Progress towards PT goals: Progressing toward goals    Frequency    Min 3X/week      PT Plan Current plan remains appropriate    Co-evaluation              AM-PAC PT "6 Clicks" Mobility   Outcome Measure  Help needed turning from your back to your side while in a flat bed without using bedrails?: A Lot Help needed moving from lying on your back to sitting on the side of a flat bed without using bedrails?: A Lot Help needed moving to and from a bed to a chair (including a wheelchair)?: Total Help needed standing up from a chair using your arms (e.g., wheelchair or bedside chair)?: Total Help needed to walk in hospital room?: Total Help needed climbing 3-5 steps with a railing? : Total 6 Click Score: 8    End of Session Equipment Utilized During Treatment: Gait belt Activity Tolerance: Patient limited by pain;Other (comment) (limited by fear of falling) Patient left: in chair;with call bell/phone within reach;with chair alarm set;with family/visitor present Nurse Communication: Mobility status;Need for lift equipment PT Visit Diagnosis: Repeated falls (R29.6);Muscle weakness  (generalized) (M62.81);Other abnormalities of gait and mobility (R26.89);Difficulty in walking, not elsewhere classified (R26.2)     Time: [id number] PT Time Calculation (min) (ACUTE ONLY): 28 min  Charges:  $Therapeutic Exercise: 8-22 mins $Therapeutic Activity: 8-22 mins                     Moishe Spice, PT, DPT Acute Rehabilitation Services  Pager: [phone] Office: [place] 07/15/2021, 12:02 PM

## 2021-07-15 NOTE — Progress Notes (Signed)
In/out cath was completed. 1200 ml was drained. UA and culture sent to lab. Pt complains of pain on bottom around anus and perineal area. She is red and raw from being wiped and cleaned up many times. Barrier cream was applied to help.

## 2021-07-15 NOTE — Progress Notes (Signed)
   Subjective:  Working with PT at time of initial visit this morning at which time she denies any complaints. Discussed plan for discharge to SNF for rehab with daughter, who was at bedside.   Shortly after leaving the room, I was messaged by the ortho team who wanted to note pt's complaints of RUE weakness and a positive babinski on the left.   I returned to the room further further neurologic assessment. Pt and her daughter note that this RUE weakness has been present for the past 6-7 months and has been associated with bilateral hand tremors that are worse with intention, abnormal sleep movements (to the extent of falling out of bed), abnormal dreams and "illusions". She notes that she sees little people running around her room at night.  Denies auditory hallucinations. Denies numbness or tingling in the right extremity.  Denies prior stroke. Several family members have similar tremors and have dementia.   During my discussion with her, she grabbed her stomach and noted abdominal pain. Bedside RN notes frequent Bms this morning.    Objective:  Vital signs in last 24 hours: Vitals:   07/14/21 2102 07/15/21 0538 07/15/21 0754 07/15/21 1426  BP: 121/71 109/64 109/60 106/67  Pulse: 80 85 76 78  Resp: '18 18 15 15  '$ Temp: 97.9 F (36.6 C) 98.3 F (36.8 C) 97.7 F (36.5 C) 98 F (36.7 C)  TempSrc: Oral Oral  Oral  SpO2: 100% 97% 97% 99%  Weight:      Height:       General: chronically ill appearing, in NAD Cardiac: irregular rhythm, regular rate, trace LLE edema Pulm: breathing comfortably on room air, lungs clear GI: soft, non-tender, bs active Neurologic exam Mental status: a/o x4 Speech: normal pattern and articulation Cranial nerves: PERRL, EOM intact, facial sensation intact, no facial droop, tongue midline Motor: 5/5 strength in the LUE. 2/5 strength of the upper right extremity. 4/5 hand grip strength. Unable to raise LLE to antigravity. RLE strength testing  deferred. Cerebellar: mild resting tremor bilaterally, R>L, that worsens with intention involved in finger to nose testing. 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 If MRI is negative, will recommend outpatient neurology follow up  Atrial fibrillation -continue amiodarone, eliquis  Type 2 DM -Continue SSI   Anxiety/depression -continue prozac and xanax   Prior to Admission Living Arrangement:home Anticipated Discharge Location: SNF Barriers to Discharge: placement Dispo: Anticipated discharge in approximately 1-2 day(s).   Mitzi Hansen, MD Internal Medicine Resident PGY-3 [facility] Pager: [phone] 07/15/2021 4:30 PM

## 2021-07-16 ENCOUNTER — Inpatient Hospital Stay (HOSPITAL_COMMUNITY): Payer: Medicare HMO

## 2021-07-16 ENCOUNTER — Other Ambulatory Visit (HOSPITAL_COMMUNITY): Payer: Self-pay

## 2021-07-16 DIAGNOSIS — R338 Other retention of urine: Secondary | ICD-10-CM | POA: Clinically undetermined

## 2021-07-16 DIAGNOSIS — I4891 Unspecified atrial fibrillation: Secondary | ICD-10-CM | POA: Diagnosis not present

## 2021-07-16 DIAGNOSIS — E119 Type 2 diabetes mellitus without complications: Secondary | ICD-10-CM | POA: Diagnosis not present

## 2021-07-16 DIAGNOSIS — F32A Depression, unspecified: Secondary | ICD-10-CM | POA: Diagnosis not present

## 2021-07-16 DIAGNOSIS — F419 Anxiety disorder, unspecified: Secondary | ICD-10-CM | POA: Diagnosis not present

## 2021-07-16 LAB — URINE CULTURE: Culture: NO GROWTH

## 2021-07-16 LAB — GLUCOSE, CAPILLARY
Glucose-Capillary: 103 mg/dL — ABNORMAL HIGH (ref 70–99)
Glucose-Capillary: 120 mg/dL — ABNORMAL HIGH (ref 70–99)
Glucose-Capillary: 106 mg/dL — ABNORMAL HIGH (ref 70–99)

## 2021-07-16 IMAGING — MR MR CERVICAL SPINE W/O CM
5 series · 42 of 48 positions shown · non-contrast
Comparison: Radiographs [DATE].

CLINICAL DATA: Myelopathy, acute or progressive.

EXAM:
MRI CERVICAL SPINE WITHOUT CONTRAST
TECHNIQUE: Multiplanar, multisequence MR imaging of the cervical spine was
performed. No intravenous contrast was administered.

[Series 7: T2 · sagittal · 3.0mm · 0.69mm/px · 6 of 15 slices shown (1 of 2)]
[im 1/15]
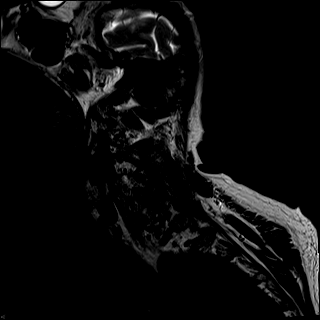
[im 3/15]
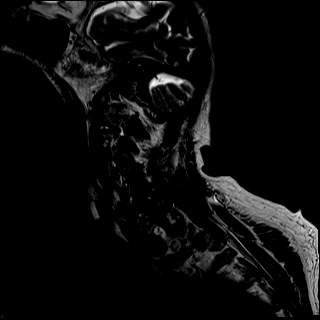
[im 6/15]
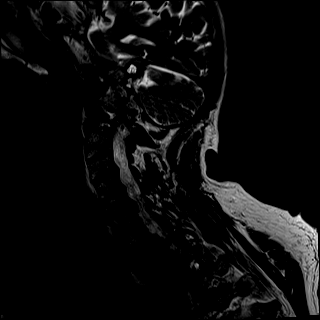
[im 9/15]
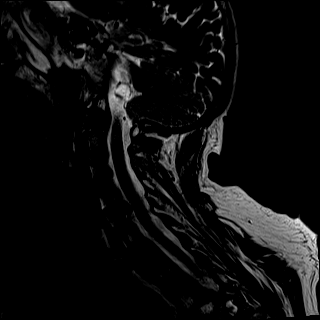
[im 12/15]
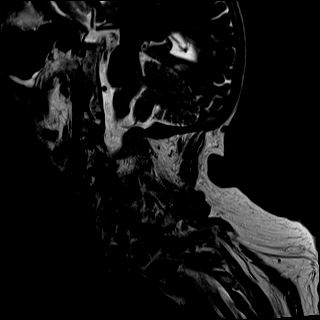
[im 15/15]
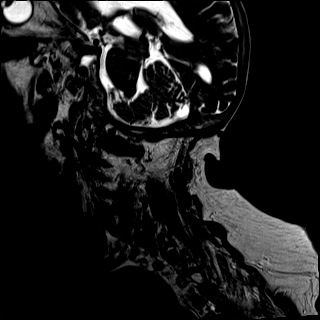

[Series 8: STIR · sagittal · 3.0mm · 0.86mm/px · 7 of 15 slices shown]
[im 1/15]
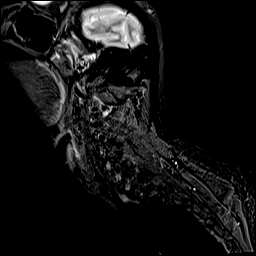
[im 3/15]
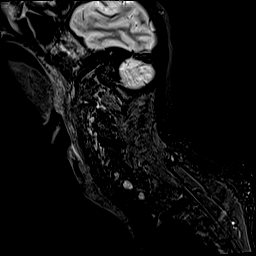
[im 5/15]
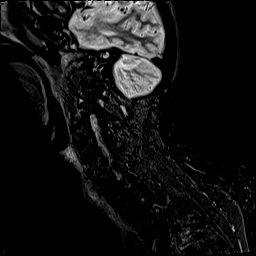
[im 8/15]
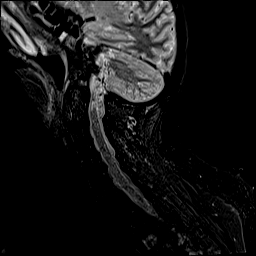
[im 10/15]
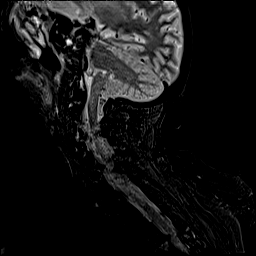
[im 12/15]
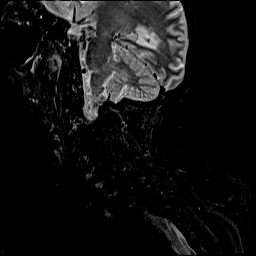
[im 15/15]
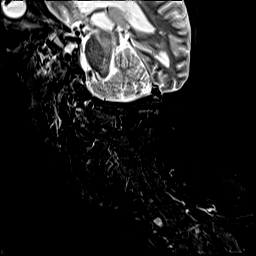

[Series 9: T2 · axial · 3.0mm · 0.75mm/px · z∈[-269,-178]mm · 14 of 32 slices shown (2 of 2)]
[im 1/32]
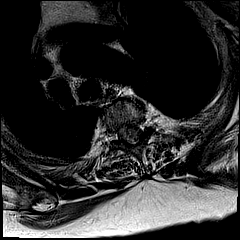
[im 3/32]
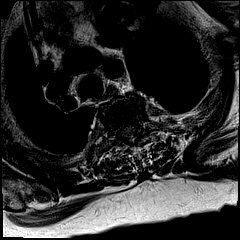
[im 5/32]
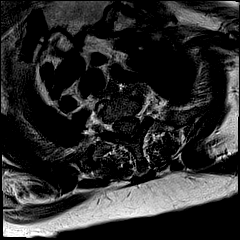
[im 8/32]
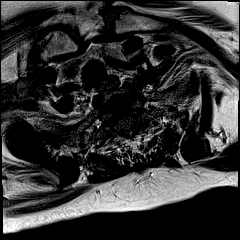
[im 10/32]
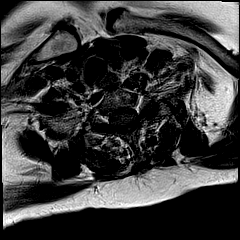
[im 12/32]
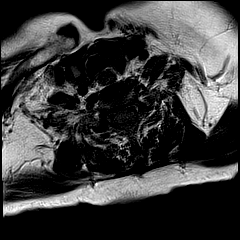
[im 15/32]
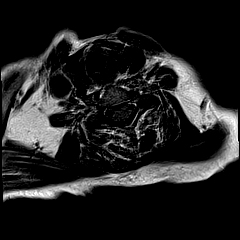
[im 17/32]
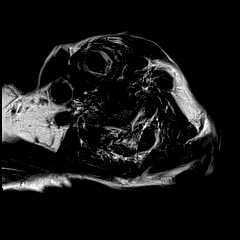
[im 20/32]
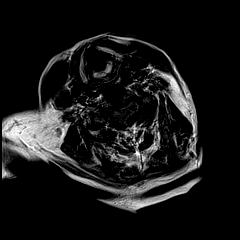
[im 22/32]
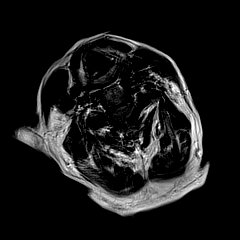
[im 24/32]
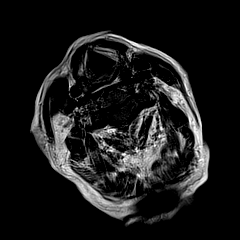
[im 27/32]
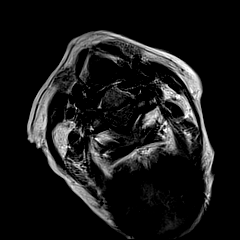
[im 29/32]
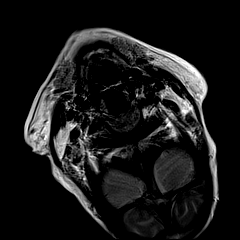
[im 32/32]
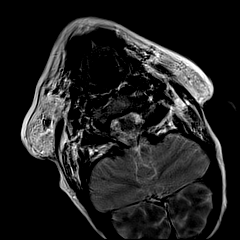

[Series 10: GRE · axial · 3.0mm · 0.39mm/px · z∈[-275,-184]mm · 8 of 32 slices shown]
[im 1/32]
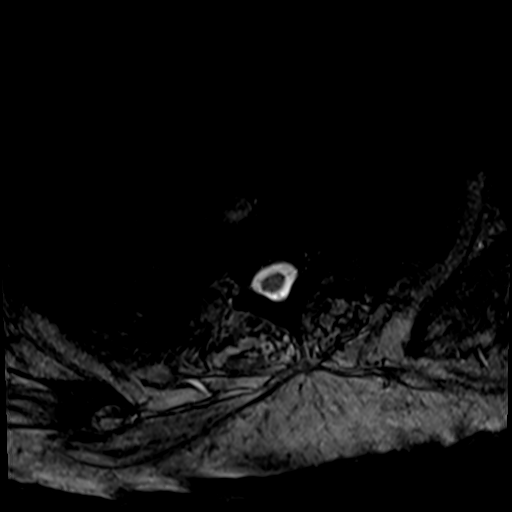
[im 5/32]
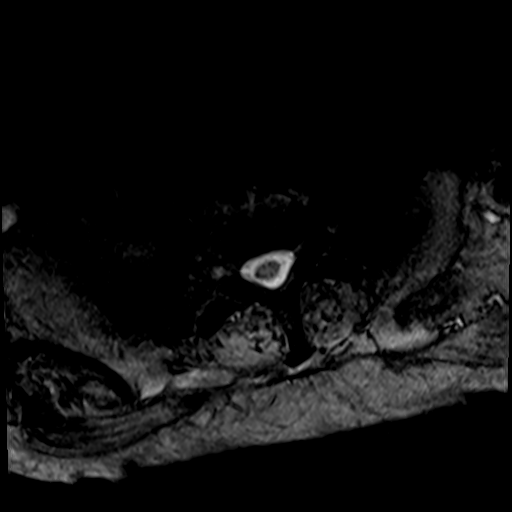
[im 10/32]
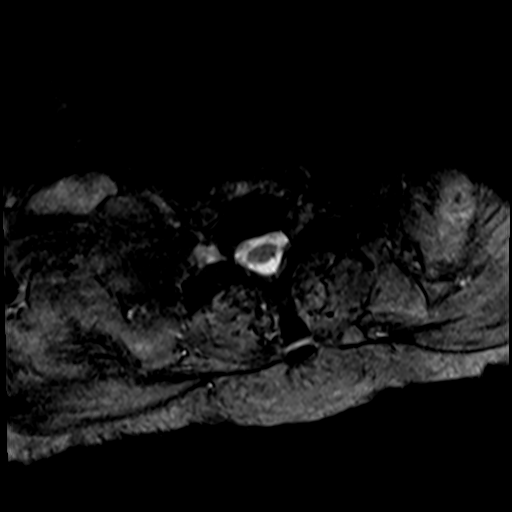
[im 15/32]
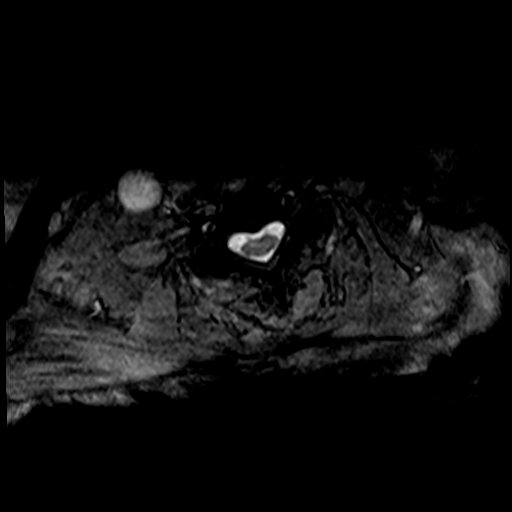
[im 17/32]
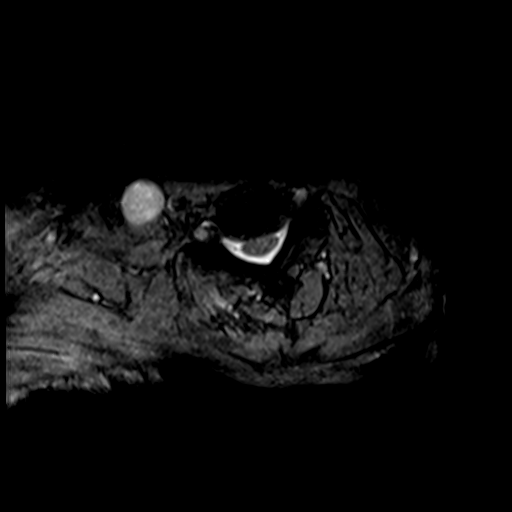
[im 22/32]
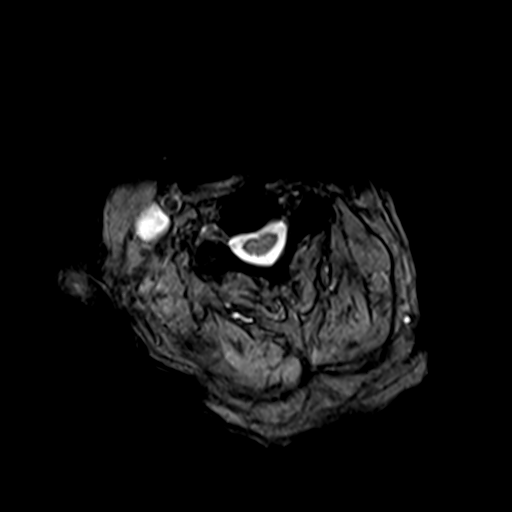
[im 27/32]
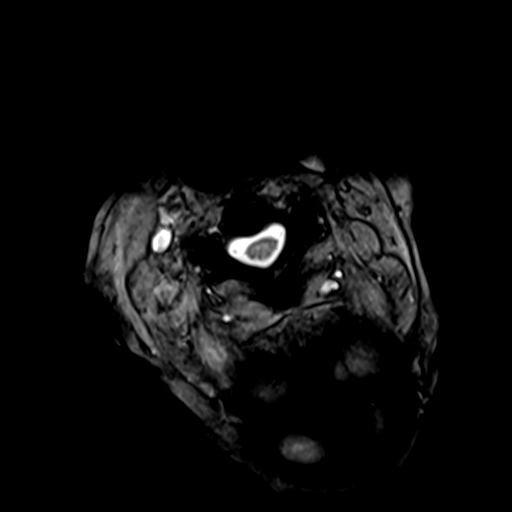
[im 32/32]
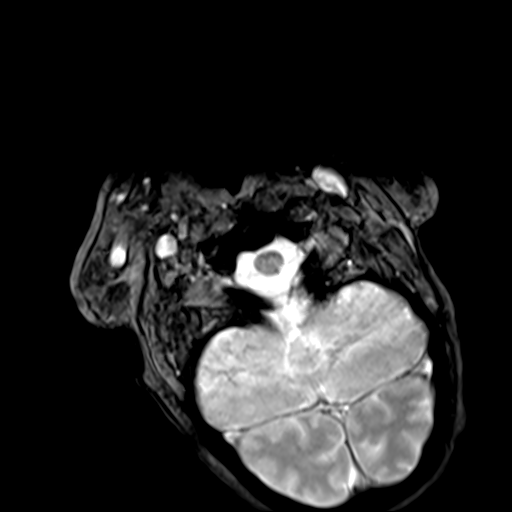

[Series 11: T1 · sagittal · 3.0mm · 0.78mm/px · 7 of 15 slices shown]
[im 1/15]
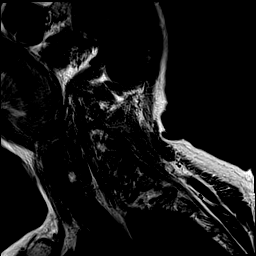
[im 3/15]
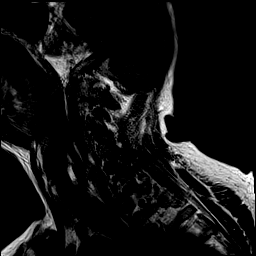
[im 5/15]
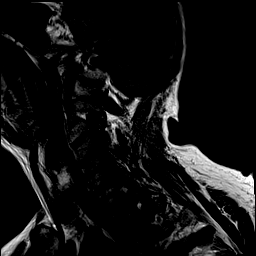
[im 8/15]
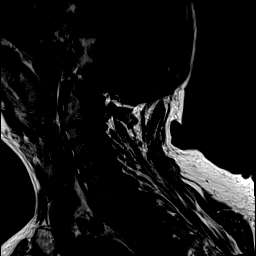
[im 10/15]
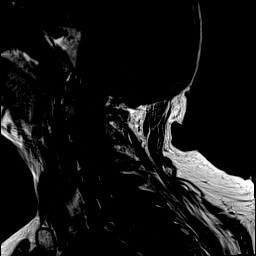
[im 12/15]
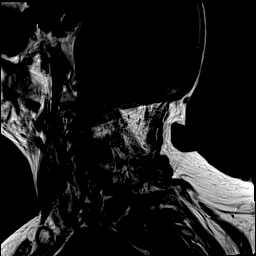
[im 15/15]
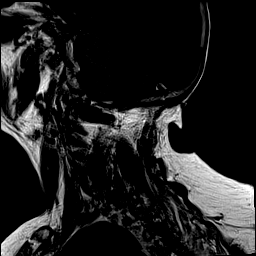

[42 of 48 positions shown; findings below may reference images not displayed]

FINDINGS: Alignment: Small anterolisthesis of C7 over T1.

Vertebrae: No fracture, evidence of discitis, or aggressive bone
lesion. Hemangioma at the T3 and T4 vertebral bodies.

Cord: Normal signal and morphology.

Posterior Fossa, vertebral arteries, paraspinal tissues: A 9 mm T2
hyperintense thyroid nodule.

Disc levels:

C2-3: No spinal canal or neural foraminal stenosis.

C3-4: Small posterior disc protrusion and mild facet degenerative
changes without significant spinal canal or neural foraminal
stenosis.

C4-5: Small posterior disc protrusion without significant spinal
canal stenosis. Facet degenerative changes resulting in mild right
neural foraminal narrowing.

C5-6: Loss of disc height, posterior disc osteophyte complex
resulting in mild spinal canal stenosis. Uncovertebral and facet
degenerative changes resulting in moderate right and severe left
neural foraminal narrowing.

C6-7: Small posterior disc protrusion without significant spinal
canal stenosis. No significant neural foraminal narrowing.

C7-T1: No spinal canal or neural foraminal stenosis.
IMPRESSION: 1. No spinal cord lesion identified.
2. Degenerative changes at C5-6 resulting in mild spinal canal
stenosis, moderate right and severe left neural foraminal narrowing.

## 2021-07-16 IMAGING — MR MR HEAD W/O CM
11 of 12 series · 44 of 48 positions shown · non-contrast
Comparison: Head CT [DATE]

CLINICAL DATA: Stroke follow-up.

EXAM:
MRI HEAD WITHOUT CONTRAST
TECHNIQUE: Multiplanar, multiecho pulse sequences of the brain and surrounding
structures were obtained without intravenous contrast.

[Series 5: DWI · axial · 3.0mm · 0.88mm/px · z∈[-64,+68]mm · 10 of 96 slices shown (1 of 4)]
[im 1/96]
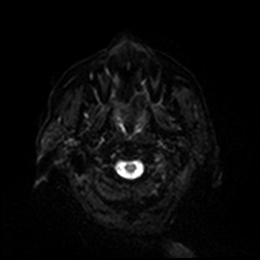
[im 11/96]
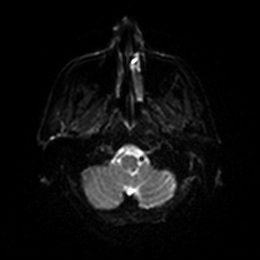
[im 22/96]
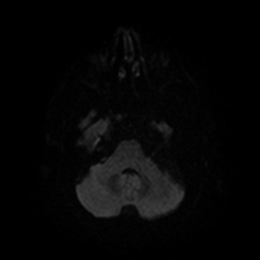
[im 32/96]
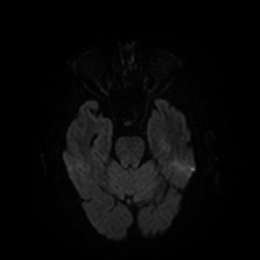
[im 43/96]
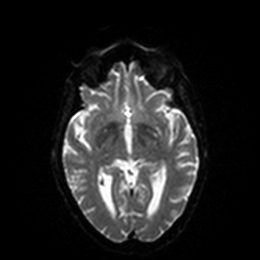
[im 53/96]
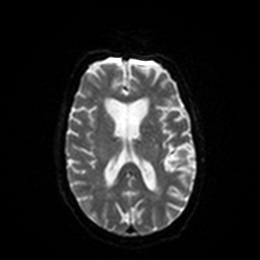
[im 64/96]
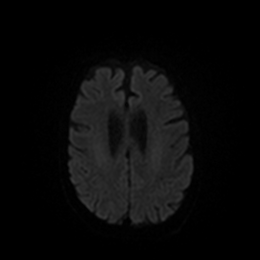
[im 74/96]
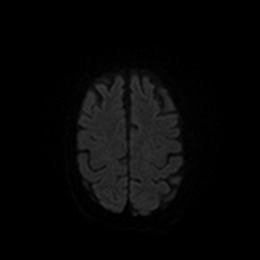
[im 85/96]
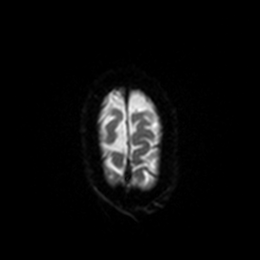
[im 96/96]
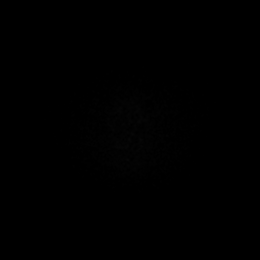

[Series 6: DWI · axial · 3.0mm · 0.88mm/px · z∈[-64,+68]mm · 4 of 48 slices shown (2 of 4)]
[im 1/48]
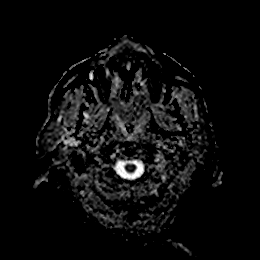
[im 16/48]
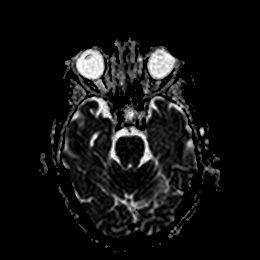
[im 32/48]
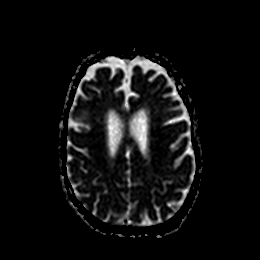
[im 48/48]
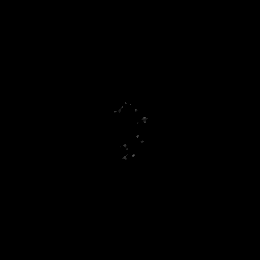

[Series 7: DWI · coronal · 4.0mm · 0.88mm/px · 6 of 64 slices shown (3 of 4)]
[im 1/64]
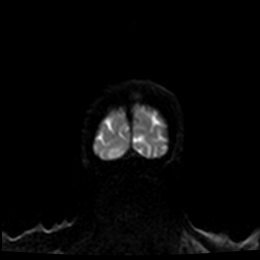
[im 13/64]
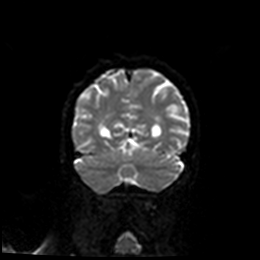
[im 26/64]
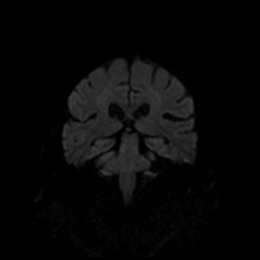
[im 38/64]
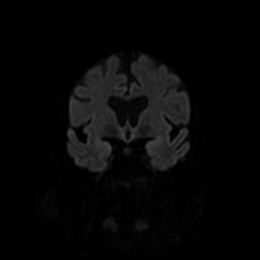
[im 51/64]
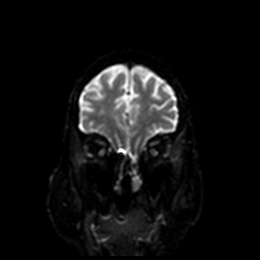
[im 64/64]
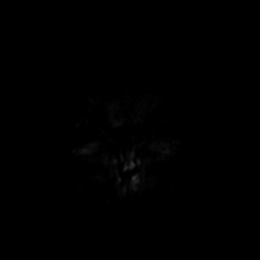

[Series 8: DWI · coronal · 4.0mm · 0.88mm/px · 3 of 32 slices shown (4 of 4)]
[im 1/32]
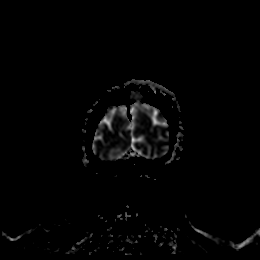
[im 16/32]
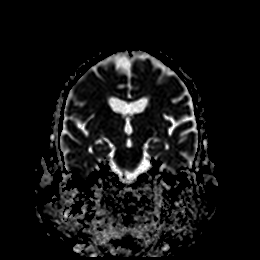
[im 32/32]
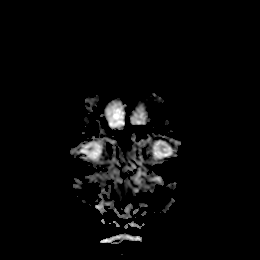

[Series 9: T1 · sagittal · 5.0mm · 0.75mm/px · 2 of 23 slices shown]
[im 1/23]
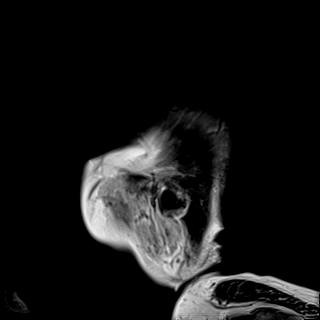
[im 23/23]
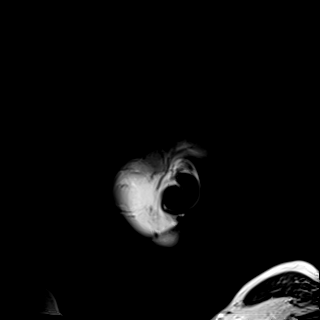

[Series 10: T2 · axial · 5.0mm · 0.72mm/px · z∈[-66,+68]mm · 2 of 25 slices shown (1 of 2)]
[im 1/25]
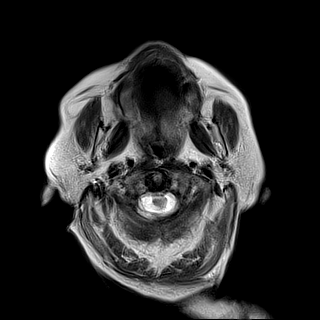
[im 25/25]
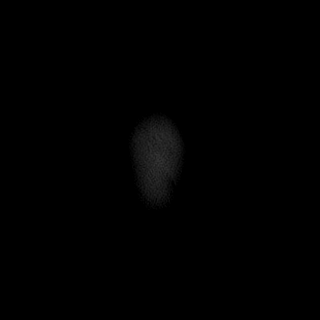

[Series 11: FLAIR · axial · 5.0mm · 0.45mm/px · z∈[-67,+68]mm · 2 of 25 slices shown]
[im 1/25]
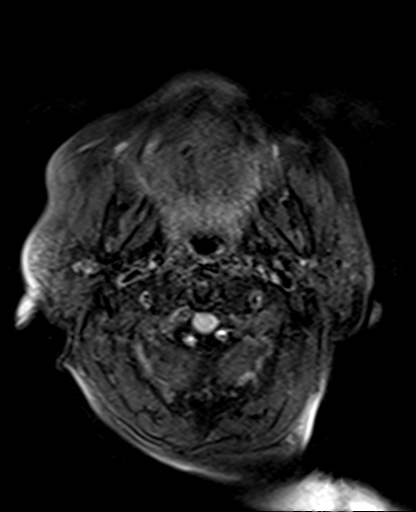
[im 25/25]
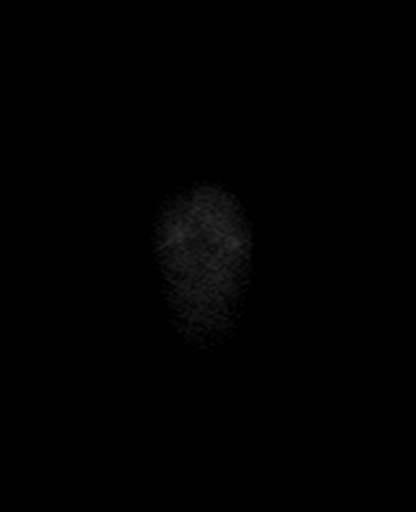

[Series 12: mag_images · axial · 3.0mm · 0.90mm/px · z∈[-66,+66]mm · 4 of 48 slices shown]
[im 1/48]
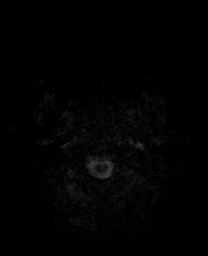
[im 16/48]
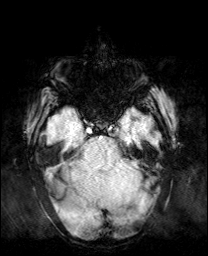
[im 32/48]
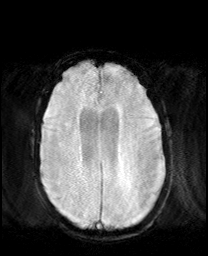
[im 48/48]
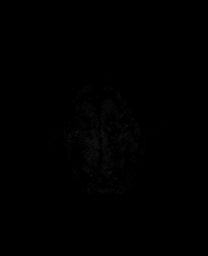

[Series 13: pha_images · axial · 3.0mm · 0.90mm/px · z∈[-66,+66]mm · 4 of 48 slices shown]
[im 1/48]
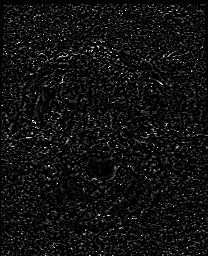
[im 16/48]
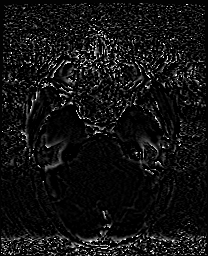
[im 32/48]
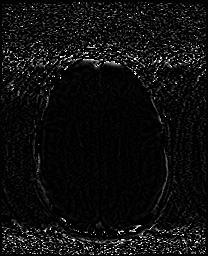
[im 48/48]
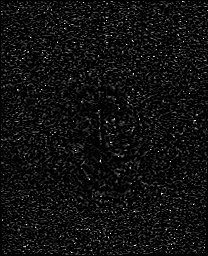

[Series 14: swi_images · axial · 3.0mm · 0.90mm/px · z∈[-66,+66]mm · 4 of 48 slices shown]
[im 1/48]
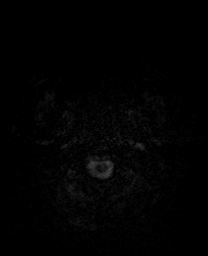
[im 16/48]
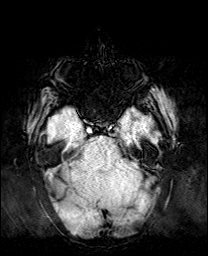
[im 32/48]
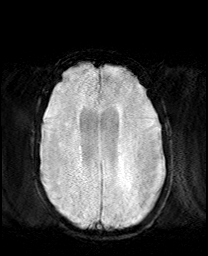
[im 48/48]
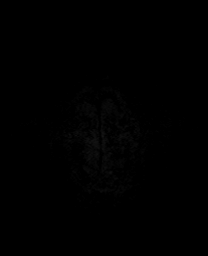

[Series 17: T2 · coronal · 5.0mm · 0.72mm/px · 3 of 29 slices shown (2 of 2)]
[im 1/29]
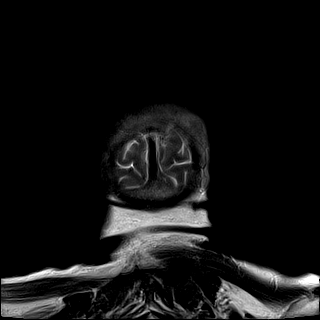
[im 15/29]
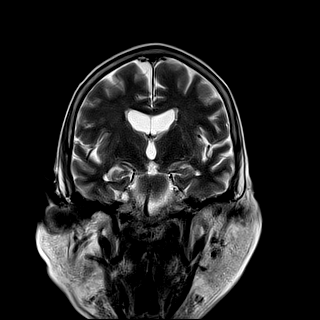
[im 29/29]
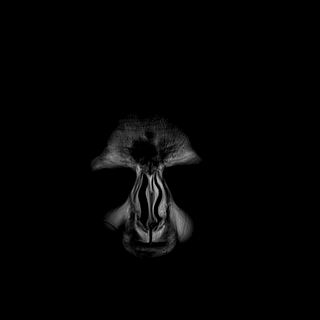

[44 of 48 positions shown; findings below may reference images not displayed]

FINDINGS: The study is partially degraded by motion.

Brain: No acute infarction, hemorrhage, hydrocephalus, extra-axial
collection or mass lesion. Patchy T2 hyperintensity in the
periventricular white matter and a few scattered T2 hyperintense
foci, nonspecific, most likely related to chronic microangiopathy.
Mild parenchymal volume loss with mild frontal predominance.

Vascular: Normal flow voids.

Skull and upper cervical spine: Normal marrow signal.

Sinuses/Orbits: Mucosal thickening in the bilateral sphenoid
sinuses. The orbits are maintained.

Other: Mild bilateral mastoid effusion.
IMPRESSION: 1. No acute intracranial abnormality.
2. Mild chronic microvascular ischemic changes of the white matter.
3. Mild parenchymal volume loss with mild frontal predominance.

## 2021-07-16 MED ORDER — FLUOXETINE HCL 40 MG PO CAPS: 40.0000 mg | ORAL_CAPSULE | Freq: Every day | ORAL | 0 refills | Status: AC

## 2021-07-16 MED ORDER — DICLOFENAC SODIUM 1 % EX GEL
2.0000 g | Freq: Four times a day (QID) | CUTANEOUS | 0 refills | Status: AC
  Filled 2021-07-16: qty 100, 25d supply, fill #0

## 2021-07-16 MED ORDER — TRAMADOL HCL 50 MG PO TABS: 50.0000 mg | ORAL_TABLET | Freq: Four times a day (QID) | ORAL | 0 refills | Status: AC | PRN

## 2021-07-16 MED ORDER — CHLORHEXIDINE GLUCONATE CLOTH 2 % EX PADS
6.0000 | MEDICATED_PAD | Freq: Every day | CUTANEOUS | Status: DC
  Administered 2021-07-16 – 2021-07-17 (×2): 6 via TOPICAL

## 2021-07-16 MED ORDER — VITAMIN D3 25 MCG PO TABS: 2000.0000 [IU] | ORAL_TABLET | Freq: Two times a day (BID) | ORAL | 0 refills | Status: AC

## 2021-07-16 MED ORDER — CALCIUM CITRATE 150 MG PO CAPS: 300.0000 mg | ORAL_CAPSULE | Freq: Every day | ORAL | 0 refills | Status: AC

## 2021-07-16 NOTE — Progress Notes (Signed)
Attempted to call report (SNF) no answer, will continue efforts

## 2021-07-16 NOTE — TOC Progression Note (Signed)
Transition of Care [facility]) - Progression Note    Patient Details  Name: [name] [name] MRN: [id number] Date of Birth: 01-27-1946  Transition of Care [facility]) CM/SW Contact  Milinda Antis, [place] Phone Number: 07/16/2021, 11:06 AM  Clinical Narrative:     CSW spoke with Tressa Busman from [facility].  The patient can d/c to the facility today if she is medically ready.    Care team notified.  Expected Discharge Plan: [place] Barriers to Discharge: Continued Medical Work up  Expected Discharge Plan and Services Expected Discharge Plan: [place] In-house Referral: Clinical Social Work     Living arrangements for the past 2 months: Mobile Home                                       Social Determinants of Health (SDOH) Interventions    Readmission Risk Interventions No flowsheet data found.

## 2021-07-16 NOTE — Discharge Summary (Addendum)
Name: [name] [name] MRN: [id number] DOB: 02-Apr-1946 75 y.o. PCP: [name] Noon, MD  Date of Admission: 07/08/2021 12:03 AM Date of Discharge: 07/16/21 Attending Physician: Lucious Groves, DO  Discharge Diagnosis: 1. Distal Femur Fracture, Resolved by ORIF 2. Atrial Fibrillation  3. Type 2 Diabetes 4. Anxiety  Depression 5. Urinary Rentention  Discharge Medications: Allergies as of 07/16/2021       Reactions   Ace Inhibitors Other (See Comments)   07/08/21 Pt states these meds do not bother her.   Codeine Other (See Comments)   headache   Morphine And Related Other (See Comments)   Hallucinations 07/08/21 headaches   Oxycodone-acetaminophen Nausea And Vomiting        Medication List     STOP taking these medications    HYDROcodone-acetaminophen 5-325 MG tablet Commonly known as: NORCO/VICODIN       TAKE these medications    acetaminophen 650 MG CR tablet Commonly known as: TYLENOL Take 1,300 mg by mouth every 8 (eight) hours as needed for pain.   ALPRAZolam 0.25 MG tablet Commonly known as: XANAX Take 1 tablet (0.25 mg total) by mouth 2 (two) times daily as needed for anxiety.   amiodarone 200 MG tablet Commonly known as: PACERONE Take 200 mg by mouth 2 (two) times daily.   apixaban 5 MG Tabs tablet Commonly known as: ELIQUIS Take 1 tablet (5 mg total) by mouth 2 (two) times daily.   Calcium Citrate 150 MG Caps Take 2 capsules (300 mg total) by mouth daily.   diclofenac Sodium 1 % Gel Commonly known as: VOLTAREN Apply 2 g topically 4 (four) times daily. What changed: how much to take   famotidine 40 MG tablet Commonly known as: PEPCID Take 40 mg by mouth daily as needed for indigestion.   FLUoxetine 40 MG capsule Commonly known as: PROZAC Take 1 capsule (40 mg total) by mouth daily. Start taking on: July 17, 2021   lidocaine 5 % Commonly known as: LIDODERM Place 1 patch onto the skin daily. Remove & Discard patch within 12 hours  or as directed by MD   Magnesium Oxide 400 MG Caps Take 1 capsule (400 mg total) by mouth daily.   One-A-Day Womens 50+ Tabs Take 1 tablet by mouth daily.   polyethylene glycol powder 17 GM/SCOOP powder Commonly known as: GLYCOLAX/MIRALAX Take 17 g by mouth daily.   potassium chloride 10 MEQ tablet Commonly known as: KLOR-CON Take 10 mEq by mouth 2 (two) times daily.   simvastatin 80 MG tablet Commonly known as: ZOCOR Take 80 mg by mouth at bedtime.   traMADol 50 MG tablet Commonly known as: ULTRAM Take 1 tablet (50 mg total) by mouth every 6 (six) hours as needed for up to 7 days for moderate pain.   Vitamin D3 25 MCG tablet Commonly known as: Vitamin D Take 2 tablets (2,000 Units total) by mouth 2 (two) times daily.               Discharge Care Instructions  (From admission, onward)           Start     Ordered   07/16/21 0000  Discharge wound care:       Comments: Use Barrier cream   07/16/21 1524            Disposition and follow-up:   [name] [name] was discharged from [facility] in Stable condition.  At the hospital follow up visit please address:  1. Distal Femur Fracture, Resolved by ORIF-surgery was successful patient is receiving postop pain medication. 2. Atrial Fibrillation-controlled with home medication amiodarone 200 mg twice daily 3. Type 2 Diabetes-current A1c 5.4% currently managed with diet only. 4. Anxiety  Depression- currently on fluoxetine 40 mg daily and Alprazolam 0.25 twice daily as needed. 5.  Urinary retention-Foley has been placed  2.  Labs / imaging needed at time of follow-up: None  3.  Pending labs/ test needing follow-up: None  Follow-up Appointments:  Follow-up Information     Altamese Buda, MD Follow up in 2 week(s).   Specialty: Orthopedic Surgery Contact information: [place] [address] [phone]         [name] Noon, MD Follow up in 2 week(s).    Specialty: Family Medicine Contact information: [place] [address] [phone]         Troy Sine, MD .   Specialty: Cardiology Contact information: [address] [place] [address] [place] by problem list: 1. Distal Femur Fracture, Resolved by ORIF-presented to the ED on 07/08/21 with right knee pain after a fall.  X-ray of right knee reveals oblique fracture through the distal femur extending into the intercondylar notch.  Orthopedic surgery performed 07/10/2021-open reduction internal fixation.  Surgery was successful. 2. Atrial Fibrillation-continued on home meds amiodarone 200 mg twice daily  3. Type 2 Diabetes-currently managed with diet only.  Current A1c 5.4%.  Patient was on SSI during hospital course. 4. Anxiety  Depression-patient was continued on home medications fluoxetine and alprazolam. 5.  Urinary retention-on 07/15/2021 patient complained of lower abdominal tenderness.  She expressed urgency, urine was not being expelled through pure wick.  Straight cath produced 1200 mL.  Foley was placed due to urinary retention.  Subjective: I seen and evaluated [name] [name] at bedside.  She was accompanied by her [name].  She states she is feeling fine.  She is having some mild constipation.  She denies any pain at the moment.  She reports that her right leg feels heavy when attempting to move it.  She acknowledges that she will be working with physical therapy at the skilled nursing facility to improve functionality.  Discharge Exam:   BP 124/66 (BP Location: Left Arm)   Pulse 69   Temp 98.5 F (36.9 C) (Oral)   Resp 17   Ht 5' (1.524 m)   Wt 85.7 kg   SpO2 95%   BMI 36.91 kg/m  Discharge exam: Physical Exam HENT:     Head: Normocephalic and atraumatic.  Cardiovascular:     Rate and Rhythm: Normal rate. Rhythm irregular.  Pulmonary:     Effort: Pulmonary effort is normal.      Breath sounds: Normal breath sounds.  Abdominal:     General: Bowel sounds are normal.     Palpations: Abdomen is soft.  Musculoskeletal:     Comments: Bandage on right knee covering incision site.  Skin:    General: Skin is warm and dry.  Neurological:     General: No focal deficit present.     Mental Status: She is alert.  Psychiatric:        Mood and Affect: Mood normal.        Behavior: Behavior normal. Behavior is cooperative.     Pertinent Labs, Studies, and Procedures:  CBC Latest Ref Rng &  Units 07/14/2021 07/13/2021 07/12/2021  WBC 4.0 - 10.5 K/uL 5.4 3.7(L) 3.9(L)  Hemoglobin 12.0 - 15.0 g/dL 9.2(L) 8.7(L) 8.0(L)  Hematocrit 36.0 - 46.0 % 28.6(L) 26.4(L) 24.5(L)  Platelets 150 - 400 K/uL 362 258 264   CMP Latest Ref Rng & Units 07/15/2021 07/13/2021 07/11/2021  Glucose 70 - 99 mg/dL 148(H) 111(H) 163(H)  BUN 8 - 23 mg/dL '21 21 22  '$ Creatinine 0.44 - 1.00 mg/dL 0.75 0.67 0.75  Sodium 135 - 145 mmol/L 128(L) 132(L) 131(L)  Potassium 3.5 - 5.1 mmol/L 4.6 4.6 4.4  Chloride 98 - 111 mmol/L 97(L) 100 99  CO2 22 - 32 mmol/L '25 25 22  '$ Calcium 8.9 - 10.3 mg/dL 8.3(L) 8.2(L) 8.5(L)  Total Protein 6.5 - 8.1 g/dL - - -  Total Bilirubin 0.3 - 1.2 mg/dL - - -  Alkaline Phos 38 - 126 U/L - - -  AST 15 - 41 U/L - - -  ALT 0 - 44 U/L - - -   BMP Latest Ref Rng & Units 07/15/2021 07/13/2021 07/11/2021  Glucose 70 - 99 mg/dL 148(H) 111(H) 163(H)  BUN 8 - 23 mg/dL '21 21 22  '$ Creatinine 0.44 - 1.00 mg/dL 0.75 0.67 0.75  Sodium 135 - 145 mmol/L 128(L) 132(L) 131(L)  Potassium 3.5 - 5.1 mmol/L 4.6 4.6 4.4  Chloride 98 - 111 mmol/L 97(L) 100 99  CO2 22 - 32 mmol/L '25 25 22  '$ Calcium 8.9 - 10.3 mg/dL 8.3(L) 8.2(L) 8.5(L)   EXAM: RIGHT KNEE - COMPLETE 4+ VIEW   COMPARISON:  None.   FINDINGS: There is a an oblique fracture through the distal right femoral metaphysis and extending into the intercondylar notch separating the lateral and medial condyles. Tricompartmental degenerative  changes are seen. Small joint effusion is noted.   IMPRESSION: Oblique fracture through the distal femur extending into the intercondylar notch. Small joint effusion is seen.   Tricompartmental degenerative changes are noted.   CLINICAL DATA:  Postop.   EXAM: PORTABLE RIGHT KNEE - 1-2 VIEW   COMPARISON:  Preoperative radiograph 07/08/2021   FINDINGS: ORIF distal femur fracture. Lateral plate and multi screw fixation. Smaller medial plate and multi screw fixation. Fracture is in improved alignment from preoperative imaging. Recent postsurgical change includes air and edema in the soft tissues. Moderate to advanced knee osteoarthritis is seen.   IMPRESSION: ORIF distal femur fracture in improved alignment from preoperative imaging. No immediate postoperative complication.    Discharge Instructions: Discharge Instructions     Call MD for:  difficulty breathing, headache or visual disturbances   Complete by: As directed    Call MD for:  extreme fatigue   Complete by: As directed    Call MD for:  hives   Complete by: As directed    Call MD for:  persistant dizziness or light-headedness   Complete by: As directed    Call MD for:  persistant nausea and vomiting   Complete by: As directed    Call MD for:  redness, tenderness, or signs of infection (pain, swelling, redness, odor or green/yellow discharge around incision site)   Complete by: As directed    Call MD for:  severe uncontrolled pain   Complete by: As directed    Call MD for:  temperature >100.4   Complete by: As directed    Diet - low sodium heart healthy   Complete by: As directed    Discharge wound care:   Complete by: As directed    Use Barrier cream  Increase activity slowly   Complete by: As directed          Signed: Timothy Lasso, MD 07/16/2021, 3:27 PM   Pager: [phone]

## 2021-07-16 NOTE — Plan of Care (Signed)

## 2021-07-16 NOTE — Progress Notes (Signed)
Attempted to call report [facility] SNF no answer.

## 2021-07-16 NOTE — TOC Transition Note (Addendum)
Transition of Care [facility]) - CM/SW Discharge Note   Patient Details  Name: [name] [name] MRN: [id number] Date of Birth: [birth year]  [facility] CM/SW Contact:  Milinda Antis, [place] Phone Number: 07/16/2021, 3:50 PM   Clinical Narrative:    Patient will DC to: [facility] Anticipated DC date: 07/16/2021 Family notified:yes  Transport by: Corey Harold   Per MD patient ready for DC to SNF. RN to call report prior to discharge ([id number]. RN, patient, patient's family, and facility notified of DC. Discharge Summary and FL2 sent to facility. DC packet on chart. Ambulance transport will be requested for patient when RN reports that patient is ready.   CSW will sign off for now as social work intervention is no longer needed. Please consult [place] again if new needs arise.     Final next level of care: Skilled Nursing Facility Barriers to Discharge: Barriers Resolved   Patient Goals and CMS Choice Patient states their goals for this hospitalization and ongoing recovery are:: "I want to be able to walk again" CMS Medicare.gov Compare Post Acute Care list provided to:: Patient Choice offered to / list presented to : Patient  Discharge Placement              Patient chooses bed at: [facility] Patient to be transferred to facility by: [place] Name of family member notified: Lovena Neighbours (Sister)   [phone] Patient and family notified of of transfer: 07/16/21  Discharge Plan and Services In-house Referral: Clinical Social Work                                   Social Determinants of Health ([place]) Interventions     Readmission Risk Interventions No flowsheet data found.

## 2021-07-16 NOTE — Progress Notes (Signed)
Nutrition Follow-up  DOCUMENTATION CODES:   Obesity unspecified  INTERVENTION:   -Continue dysphagia 3 (advanced mechanical soft) diet for ease of intake -Continue MVI with minerals daily -Continue Glucerna Shake po TID, each supplement provides 220 kcal and 10 grams of protein  -Continue Magic cup BID with meals, each supplement provides 290 kcal and 9 grams of protein   NUTRITION DIAGNOSIS:   Increased nutrient needs related to post-op healing as evidenced by estimated needs.  Ongoing  GOAL:   Patient will meet greater than or equal to 90% of their needs  Progressing   MONITOR:   PO intake, Supplement acceptance, Labs, Weight trends, Skin, I & O's  REASON FOR ASSESSMENT:   Consult Assessment of nutrition requirement/status  ASSESSMENT:   [name] is a 75 y/o female with a PMHx of T2DM, HTN, A. Fib who is currently admitted for a distal femur fracture secondary to mechanical fall.  8/12- s/p ORIF  Reviewed I?O's: -480 ml x 24 hours and +866 ml since admission  UOP: 1.2 L x 24 hours  Pt unavailable at time of visit.   Pt with good appetite. Noted meal completions 100%. Pt typically consuming 1-2 Glucerna supplements daily.   Per MD notes, plan to d/c to SNF once medically stable.   Labs reviewed: CBGS: 112-157 (inpatient orders for glycemic control are 0-5 units insulin aspart TID with meals and 0-5 units insulin aspart daily at bedtime).    Diet Order:   Diet Order             Diet Carb Modified Fluid consistency: Thin; Room service appropriate? Yes  Diet effective now                   EDUCATION NEEDS:   Education needs have been addressed  Skin:  Skin Assessment: Reviewed RN Assessment  Last BM:  07/09/21  Height:   Ht Readings from Last 1 Encounters:  07/10/21 5' (1.524 m)    Weight:   Wt Readings from Last 1 Encounters:  07/10/21 85.7 kg    Ideal Body Weight:  45.5 kg  BMI:  Body mass index is 36.91  kg/m.  Estimated Nutritional Needs:   Kcal:  1650-1850  Protein:  90-105 grams  Fluid:  > 1.6 L    Loistine Chance, RD, LDN, [place] Registered Dietitian II Certified Diabetes Care and Education Specialist Please refer to [facility] for RD and/or RD on-call/weekend/after hours pager

## 2021-07-17 LAB — GLUCOSE, CAPILLARY
Glucose-Capillary: 108 mg/dL — ABNORMAL HIGH (ref 70–99)
Glucose-Capillary: 127 mg/dL — ABNORMAL HIGH (ref 70–99)

## 2021-07-17 MED ORDER — LOPERAMIDE HCL 2 MG PO CAPS
2.0000 mg | ORAL_CAPSULE | Freq: Once | ORAL | Status: AC
  Administered 2021-07-17: 2 mg via ORAL
  Filled 2021-07-17: qty 1

## 2021-07-17 MED ORDER — LOPERAMIDE HCL 2 MG PO TABS: 2.0000 mg | ORAL_TABLET | ORAL | 0 refills | Status: AC | PRN

## 2021-07-17 NOTE — Plan of Care (Signed)
  Problem: Clinical Measurements: Goal: Will remain free from infection Outcome: Progressing Goal: Cardiovascular complication will be avoided Outcome: Progressing   Problem: Activity: Goal: Risk for activity intolerance will decrease Outcome: Progressing   

## 2021-07-17 NOTE — TOC Transition Note (Signed)
Transition of Care [facility]) - CM/SW Discharge Note   Patient Details  Name: [name] [name] MRN: [id number] Date of Birth: Apr 03, 1946  [facility]) CM/SW Contact:  Milinda Antis, [place] Phone Number: 07/17/2021, 8:49 AM   Clinical Narrative:     Patient unable to transition to SNF yesterday.  Patient is discharging today.  See transition note from 07/16/2021.  Final next level of care: Skilled Nursing Facility Barriers to Discharge: Barriers Resolved   Patient Goals and CMS Choice Patient states their goals for this hospitalization and ongoing recovery are:: "I want to be able to walk again" CMS Medicare.gov Compare Post Acute Care list provided to:: Patient Choice offered to / list presented to : Patient  Discharge Placement              Patient [facility] bed at: [facility] Patient to be transferred to facility by: [place] Name of family member notified: Lovena Neighbours (Sister)   [phone] Patient and family notified of of transfer: 07/16/21  Discharge Plan and Services In-house Referral: Clinical Social Work                                   Social Determinants of Health ([place]) Interventions     Readmission Risk Interventions No flowsheet data found.

## 2021-07-17 NOTE — Progress Notes (Signed)
Report given Cyril Mourning RN of [facility], facility  ready to receive patient.

## 2021-07-17 NOTE — Progress Notes (Signed)
Attempted to call report to [facility] SNF 4 times, no answer. Oncall Dr. Wilburn Cornelia notified, stated discharge be placed on hold. PTAR notified. Day nurse to call SNF in the morning. Pt and family notified.

## 2021-07-17 NOTE — Discharge Summary (Addendum)
Name: [name] [name] MRN: [id number] DOB: 09/15/1946 75 y.o. PCP: Chesley Noon, MD  Date of Admission: 07/08/2021 12:03 AM Date of Discharge: 07/17/2021 Attending Physician: Lucious Groves, DO  Discharge Diagnosis: 1. Distal Femur Fracture, Resolved by ORIF 2. Atrial Fibrillation  3. Type 2 Diabetes 4. Anxiety  Depression 5. Urinary Rentention 6. Diarrhea     Discharge Medications: Allergies as of 07/17/2021       Reactions   Ace Inhibitors Other (See Comments)   07/08/21 Pt states these meds do not bother her.   Codeine Other (See Comments)   headache   Morphine And Related Other (See Comments)   Hallucinations 07/08/21 headaches   Oxycodone-acetaminophen Nausea And Vomiting        Medication List     STOP taking these medications    HYDROcodone-acetaminophen 5-325 MG tablet Commonly known as: NORCO/VICODIN       TAKE these medications    acetaminophen 650 MG CR tablet Commonly known as: TYLENOL Take 1,300 mg by mouth every 8 (eight) hours as needed for pain.   ALPRAZolam 0.25 MG tablet Commonly known as: XANAX Take 1 tablet (0.25 mg total) by mouth 2 (two) times daily as needed for anxiety.   amiodarone 200 MG tablet Commonly known as: PACERONE Take 200 mg by mouth 2 (two) times daily.   apixaban 5 MG Tabs tablet Commonly known as: ELIQUIS Take 1 tablet (5 mg total) by mouth 2 (two) times daily.   Calcium Citrate 150 MG Caps Take 2 capsules (300 mg total) by mouth daily.   diclofenac Sodium 1 % Gel Commonly known as: VOLTAREN Apply 2 g topically 4 (four) times daily. What changed: how much to take   famotidine 40 MG tablet Commonly known as: PEPCID Take 40 mg by mouth daily as needed for indigestion.   FLUoxetine 40 MG capsule Commonly known as: PROZAC Take 1 capsule (40 mg total) by mouth daily.   lidocaine 5 % Commonly known as: LIDODERM Place 1 patch onto the skin daily. Remove & Discard patch within 12 hours or as directed  by MD   loperamide 2 MG tablet Commonly known as: Imodium A-D Take 1 tablet (2 mg total) by mouth as needed for diarrhea or loose stools.   Magnesium Oxide 400 MG Caps Take 1 capsule (400 mg total) by mouth daily.   One-A-Day Womens 50+ Tabs Take 1 tablet by mouth daily.   polyethylene glycol powder 17 GM/SCOOP powder Commonly known as: GLYCOLAX/MIRALAX Take 17 g by mouth daily.   potassium chloride 10 MEQ tablet Commonly known as: KLOR-CON Take 10 mEq by mouth 2 (two) times daily.   simvastatin 80 MG tablet Commonly known as: ZOCOR Take 80 mg by mouth at bedtime.   traMADol 50 MG tablet Commonly known as: ULTRAM Take 1 tablet (50 mg total) by mouth every 6 (six) hours as needed for up to 7 days for moderate pain.   Vitamin D3 25 MCG tablet Commonly known as: Vitamin D Take 2 tablets (2,000 Units total) by mouth 2 (two) times daily.               Discharge Care Instructions  (From admission, onward)           Start     Ordered   07/16/21 0000  Discharge wound care:       Comments: Use Barrier cream   07/16/21 1524            Disposition and follow-up:  BelindaBelinda [name] was discharged from [facility] in Good condition.  At the hospital follow up visit please address:  1.  BelindaBelinda [name] was discharged from [facility] in Stable condition.  At the hospital follow up visit please address:   1. Distal Femur Fracture, Resolved by ORIF-surgery was successful patient is receiving postop pain medication. 2. Atrial Fibrillation-controlled with home medication amiodarone 200 mg twice daily 3. Type 2 Diabetes-current A1c 5.4% currently managed with diet only. 4. Anxiety  Depression- currently on fluoxetine 40 mg daily and Alprazolam 0.25 twice daily as needed. 5.  Urinary retention-Foley has been placed, Foley can be removed in a few days with voiding trial. 6. Diarrhea- Imodium PRN  2.  Labs / imaging needed at  time of follow-up: None  3.  Pending labs/ test needing follow-up: None  Follow-up Appointments:  Contact information for follow-up providers     Altamese [place], MD Follow up in 2 week(s).   Specialty: Orthopedic Surgery Contact information: [place] [address] [phone]         Chesley Noon, MD Follow up in 2 week(s).   Specialty: Family Medicine Contact information: [place] [address] [phone]         Troy Sine, MD .   Specialty: Cardiology Contact information: [address] [place] [address] [phone]              Contact information for after-discharge care     Destination     [facility] .   Service: Skilled Nursing Contact information: [address] [place] [facility] Course by problem list: 1. Distal Femur Fracture, Resolved by ORIF-presented to the ED on 07/08/21 with right knee pain after a fall.  X-ray of right knee reveals oblique fracture through the distal femur extending into the intercondylar notch.  Orthopedic surgery performed 07/10/2021-open reduction internal fixation.  Surgery was successful. 2. Atrial Fibrillation-continued on home meds amiodarone 200 mg twice daily  3. Type 2 Diabetes-currently managed with diet only.  Current A1c 5.4%.  Patient was on SSI during hospital course. 4. Anxiety  Depression-patient was continued on home medications fluoxetine and alprazolam. 5.  Urinary retention-on 07/15/2021 patient complained of lower abdominal tenderness.  She expressed urgency, urine was not being expelled through pure wick.  Straight cath produced 1200 mL.  Foley was placed due to urinary retention. 6. Patient has been experiencing diarrhea since one day ago. She was given Maalox post op due to mild constipation following opiate use for pain. Opioids were discontinued due to  patient complaining of hallucinations. Constipation stopped and diarrhea ensued.   Subjective: I seen and evaluated [name] [name] at bedside. She states that she is feeling fine. Denies any headache, chest pain, SOB, abd pain or pain in the lower extremities. She reports that she slept better last night. Patient reports diarrhea.  Discharge Exam:   BP (!) 118/54 (BP Location: Right Arm)   Pulse 60   Temp 98.1 F (36.7 C) (Oral)   Resp 16   Ht 5' (1.524 m)   Wt 85.7 kg   SpO2 94%   BMI 36.91 kg/m  Discharge exam:  Physical Exam HENT:     Head: Normocephalic and atraumatic.  Cardiovascular:     Rate and Rhythm: Normal rate. Rhythm  irregular.  Pulmonary:     Effort: Pulmonary effort is normal.     Breath sounds: Normal breath sounds.  Abdominal:     General: Bowel sounds are normal.     Palpations: Abdomen is soft.  Genitourinary:    Comments: Foley present Musculoskeletal:     Right lower leg: No edema.     Left lower leg: No edema.     Comments: Bandage on right knee covering incision site.  Skin:    General: Skin is warm and dry.  Neurological:     General: No focal deficit present.     Mental Status: She is alert.  Psychiatric:        Behavior: Behavior is cooperative.    Pertinent Labs, Studies, and Procedures:  CBC Latest Ref Rng & Units 07/14/2021 07/13/2021 07/12/2021  WBC 4.0 - 10.5 K/uL 5.4 3.7(L) 3.9(L)  Hemoglobin 12.0 - 15.0 g/dL 9.2(L) 8.7(L) 8.0(L)  Hematocrit 36.0 - 46.0 % 28.6(L) 26.4(L) 24.5(L)  Platelets 150 - 400 K/uL 362 258 264   CMP Latest Ref Rng & Units 07/15/2021 07/13/2021 07/11/2021  Glucose 70 - 99 mg/dL 148(H) 111(H) 163(H)  BUN 8 - 23 mg/dL '21 21 22  '$ Creatinine 0.44 - 1.00 mg/dL 0.75 0.67 0.75  Sodium 135 - 145 mmol/L 128(L) 132(L) 131(L)  Potassium 3.5 - 5.1 mmol/L 4.6 4.6 4.4  Chloride 98 - 111 mmol/L 97(L) 100 99  CO2 22 - 32 mmol/L '25 25 22  '$ Calcium 8.9 - 10.3 mg/dL 8.3(L) 8.2(L) 8.5(L)  Total Protein 6.5 - 8.1 g/dL - - -  Total  Bilirubin 0.3 - 1.2 mg/dL - - -  Alkaline Phos 38 - 126 U/L - - -  AST 15 - 41 U/L - - -  ALT 0 - 44 U/L - - -   BMP Latest Ref Rng & Units 07/15/2021 07/13/2021 07/11/2021  Glucose 70 - 99 mg/dL 148(H) 111(H) 163(H)  BUN 8 - 23 mg/dL '21 21 22  '$ Creatinine 0.44 - 1.00 mg/dL 0.75 0.67 0.75  Sodium 135 - 145 mmol/L 128(L) 132(L) 131(L)  Potassium 3.5 - 5.1 mmol/L 4.6 4.6 4.4  Chloride 98 - 111 mmol/L 97(L) 100 99  CO2 22 - 32 mmol/L '25 25 22  '$ Calcium 8.9 - 10.3 mg/dL 8.3(L) 8.2(L) 8.5(L)   EXAM: RIGHT KNEE - COMPLETE 4+ VIEW   COMPARISON:  None.   FINDINGS: There is a an oblique fracture through the distal right femoral metaphysis and extending into the intercondylar notch separating the lateral and medial condyles. Tricompartmental degenerative changes are seen. Small joint effusion is noted.   IMPRESSION: Oblique fracture through the distal femur extending into the intercondylar notch. Small joint effusion is seen.   Tricompartmental degenerative changes are noted.   CLINICAL DATA:  Postop.   EXAM: PORTABLE RIGHT KNEE - 1-2 VIEW   COMPARISON:  Preoperative radiograph 07/08/2021   FINDINGS: ORIF distal femur fracture. Lateral plate and multi screw fixation. Smaller medial plate and multi screw fixation. Fracture is in improved alignment from preoperative imaging. Recent postsurgical change includes air and edema in the soft tissues. Moderate to advanced knee osteoarthritis is seen.   IMPRESSION: ORIF distal femur fracture in improved alignment from preoperative imaging. No immediate postoperative complication.   MRI HEAD WITHOUT CONTRAST    COMPARISON:  Head CT July 08, 2021   FINDINGS: The study is partially degraded by motion.   Brain: No acute infarction, hemorrhage, hydrocephalus, extra-axial collection or mass lesion. Patchy T2 hyperintensity in the periventricular white  matter and a few scattered T2 hyperintense foci, nonspecific, most likely related to  chronic microangiopathy. Mild parenchymal volume loss with mild frontal predominance.   Vascular: Normal flow voids.   Skull and upper cervical spine: Normal marrow signal.   Sinuses/Orbits: Mucosal thickening in the bilateral sphenoid sinuses. The orbits are maintained.   Other: Mild bilateral mastoid effusion.   IMPRESSION: 1. No acute intracranial abnormality. 2. Mild chronic microvascular ischemic changes of the white matter. 3. Mild parenchymal volume loss with mild frontal predominance  MRI CERVICAL SPINE WITHOUT CONTRAST   TECHNIQUE: Multiplanar, multisequence MR imaging of the cervical spine was performed. No intravenous contrast was administered.   COMPARISON:  Radiographs July 15, 2021.   FINDINGS: Alignment: Small anterolisthesis of C7 over T1.   Vertebrae: No fracture, evidence of discitis, or aggressive bone lesion. Hemangioma at the T3 and T4 vertebral bodies.   Cord: Normal signal and morphology.   Posterior Fossa, vertebral arteries, paraspinal tissues: A 9 mm T2 hyperintense thyroid nodule.   Disc levels:   C2-3: No spinal canal or neural foraminal stenosis.   C3-4: Small posterior disc protrusion and mild facet degenerative changes without significant spinal canal or neural foraminal stenosis.   C4-5: Small posterior disc protrusion without significant spinal canal stenosis. Facet degenerative changes resulting in mild right neural foraminal narrowing.   C5-6: Loss of disc height, posterior disc osteophyte complex resulting in mild spinal canal stenosis. Uncovertebral and facet degenerative changes resulting in moderate right and severe left neural foraminal narrowing.   C6-7: Small posterior disc protrusion without significant spinal canal stenosis. No significant neural foraminal narrowing.   C7-T1: No spinal canal or neural foraminal stenosis.   IMPRESSION: 1. No spinal cord lesion identified. 2. Degenerative changes at C5-6 resulting  in mild spinal canal stenosis, moderate right and severe left neural foraminal narrowing   Discharge Instructions: Discharge Instructions     Call MD for:  difficulty breathing, headache or visual disturbances   Complete by: As directed    Call MD for:  extreme fatigue   Complete by: As directed    Call MD for:  hives   Complete by: As directed    Call MD for:  persistant dizziness or light-headedness   Complete by: As directed    Call MD for:  persistant nausea and vomiting   Complete by: As directed    Call MD for:  redness, tenderness, or signs of infection (pain, swelling, redness, odor or green/yellow discharge around incision site)   Complete by: As directed    Call MD for:  severe uncontrolled pain   Complete by: As directed    Call MD for:  temperature >100.4   Complete by: As directed    Continue foley catheter   Complete by: As directed    Diet - low sodium heart healthy   Complete by: As directed    Discharge wound care:   Complete by: As directed    Use Barrier cream   Increase activity slowly   Complete by: As directed        Signed: Timothy Lasso, MD 07/17/2021, 11:28 AM   Pager: [phone]

## 2021-07-17 NOTE — Progress Notes (Addendum)
   Subjective:  I seen and evaluated [name] [name] at bedside.  She was accompanied by her [name].  She states she is feeling fine.  She is having some mild constipation.  She denies any pain at the moment.  She reports that her right leg feels heavy when attempting to move it.  She acknowledges that she will be working with physical therapy at the skilled nursing facility to improve functionality.  Objective:  Vital signs in last 24 hours: Vitals:   07/16/21 0913 07/16/21 1247 07/16/21 2114 07/17/21 0748  BP: 134/66 124/66 112/63 (!) 118/54  Pulse: 71 69 80 60  Resp: '18 17 17 16  '$ Temp: 98.2 F (36.8 C) 98.5 F (36.9 C) 97.9 F (36.6 C) 98.1 F (36.7 C)  TempSrc: Oral Oral Oral Oral  SpO2: 98% 95% 97% 94%  Weight:      Height:       Physical Exam HENT:     Head: Normocephalic and atraumatic.  Cardiovascular:     Rate and Rhythm: Normal rate. Rhythm irregular.     Heart sounds: Normal heart sounds.  Pulmonary:     Effort: Pulmonary effort is normal.     Breath sounds: Normal breath sounds.  Abdominal:     General: Bowel sounds are normal.     Palpations: Abdomen is soft.  Musculoskeletal:     Right lower leg: No edema.     Left lower leg: No edema.     Comments: Bandage placed on right knee over incision site.  Skin:    General: Skin is warm and dry.  Neurological:     General: No focal deficit present.     Mental Status: She is alert.  Psychiatric:        Behavior: Behavior normal. Behavior is cooperative.     Assessment/Plan:  Active Problems:   HYPERTENSION, BENIGN   Atrial fibrillation (HCC)   Type 2 diabetes mellitus ([place])   Femur fracture, right ([place])   Acute urinary retention  Right hip fracture s/p ORIF -awaiting insurance authorization for skilled facility rehab -continue tylenol and tramadol -continue PT/OT  Urinary Retention Patient complained of lower abdomina pan and urgency. No urine output through purewick. Straight cath produced 1238m  of urine. Foley placed due to bladder stretch injury. Most likely medication induced.  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   ATimothy Lasso MD 07/17/2021, 7:58 AM Pager: [phone] 5pm on weekdays and 1pm on weekends: On Call pager [phone]

## 2021-07-17 NOTE — Progress Notes (Signed)
AVS , paper prescription, DNR sign and patient belongings sent with [facility]AR.

## 2021-07-20 NOTE — Care Management Important Message (Signed)
Patient left Important Message  Patient Details  Name: [name] [name] MRN: [id number] Date of Birth: [birth year]   Medicare Important Message Given:  Yes Patient left prior to IM delivery IM mailed to the patient home address.    Mairi Stagliano 07/20/2021, 11:52 AM

## 2021-08-02 NOTE — Op Note (Signed)
07/10/2021  10:31 PM  PATIENT:  [name] [name]  75 y.o. female  PRE-OPERATIVE DIAGNOSIS:  right distal femur fracture with intercondylar extension  POST-OPERATIVE DIAGNOSIS:  right distal femur fracture with intercondylar extension  PROCEDURE:  Procedure(s): OPEN REDUCTION INTERNAL FIXATION (ORIF) DISTAL FEMUR FRACTURE (Right)WITH INTERCONDYLAR EXTENSION  SURGEON:  Surgeon(s) and Role:    Altamese , MD - Primary  PHYSICIAN ASSISTANT: Ainsley Spinner, PA-C  ANESTHESIA:   general  I/O:  No intake/output data recorded.  SPECIMEN:  No Specimen  TOURNIQUET:  * No tourniquets in log *  COMPLICATIONS: NONE  DICTATION: .Note written in EPIC  DISPOSITION: TO PACU  CONDITION: STABLE  DELAY START OF DVT PROPHYLAXIS BECAUSE OF BLEEDING RISK: NO  BRIEF SUMMARY OF INDICATION FOR PROCEDURE:  [name] [name] is a pleasant 75 y.o. who sustained an intercondylar distal femur fracture. She now presents for definitive repair of this comminuted supracondylar femur fracture with intercondylar extension.  I did discuss with both patient and family the risks and benefits of surgery including the possibility of infection, nerve injury, vessel injury, DVT/ PE, loss of motion, arthritis, symptomatic hardware, malunion, nonunion, and need for further surgery among others.  After full discussion, consent was given to proceed.  BRIEF SUMMARY OF PROCEDURE:  The patient was taken to the operating room where general anesthesia was induced.  The right lower extremity was prepped and draped in usual sterile fashion.  No tourniquet was used during the procedure.  I began with exposure and antiglide plate placement medially through a 6 cm incision identified with c-arm. An LCD plate was contoured and secured. Distraction was necessary to restore appropriate bone length of the fracture and a large tong clamp produced compression at the joint line. We then turned attention laterally. C-arm was brought  in to mark the starting point on AP and lateral images distally.  Incision was then made.  Dissection was carried carefully down to the retinaculum, which was incised and divided.  We were able to then visualize the fracture site which was gently cleaned of hematoma with curette and lavage.  This was followed by placement of a 2.0 mm K-wire into the lateral femoral condyle to de-rotate and translate the fragment into a reduced position.  The medial end of the [facility] clamp was then inserted through a small medial incision and compressed to close down the fracture site anatomically which was pinned.  The 9-hole [employer] NCB plate was then advanced after achieving appropriate reduction through the distal incision proximally.  Once we were satisfied with position on AP and lateral images, a pin was placed distally and then a single screw proximally.  This was followed by additional K-wire fixation and additional screw.  We then brought the knee into full extension and checked the rotation and alignment dialing this in.  This was followed by additional bicortical screw fixation proximally such that we ended with 4 bicortical screws and 5 screws into the supracondylar region distally. All screws were extra-articular.  Wounds were irrigated thoroughly.  C-arm was brought in to confirm appropriate reduction, hardware placement, trajectory and length.  All wounds were irrigated thoroughly and then closed in standard layered fashion using #1 Vicryl, #0 Vicryl, 2-0 Vicryl, and 2-0 nylon.  A gently compressive dressing was applied from foot to thigh and then knee immobilizer.The patient was taken to PACU in stable condition.  Ainsley Spinner, PA-C, assisted me throughout and required to effectively produce, control, and maintain the reduction during provisional and  definitive internal fixation. He also assisted with wound closure.  PROGNOSIS:  Patient will be nonweightbearing on the operative extremity  with early mobilization encouraged. Pharmacologic DVT prophylaxis will resume with Eliquis. Hinged brace will be used as an adjunct if fit is adequate.    Astrid Divine. Marcelino Scot, M.D.

## 2021-09-14 NOTE — Progress Notes (Signed)
Len Childs, MD Reason for referral-atrial fibrillation  HPI: 75 year old female for evaluation of atrial fibrillation at request of Anastasia Pall, MD. Patient is followed by Dr. Terrence Dupont.  She apparently had lower extremity edema and a new patient evaluation was scheduled with me by mistake.  Nuclear study March 2016 showed ejection fraction 72% and no ischemia or infarction.  Patient was admitted in March 2022 with hypoglycemic episode/altered mental status and was noted to be in atrial fibrillation.  She has been treated with rate control and anticoagulation.  Also with history of moderate aortic stenosis by report but no echocardiogram available for review.  Patient admitted August 2022 after falling.  She had a distal femur fracture on the right.  Over the past 2 months she has had bilateral lower extremity edema.  Also with increased dyspnea with activities.  There is no orthopnea, PND, chest pain or syncope.  Cardiology now asked to evaluate.  Note history is difficult as patient is upset about being scheduled in this office.  Current Outpatient Medications  Medication Sig Dispense Refill   acetaminophen (TYLENOL) 650 MG CR tablet Take 1,300 mg by mouth every 8 (eight) hours as needed for pain.     ALPRAZolam (XANAX) 0.25 MG tablet Take 1 tablet (0.25 mg total) by mouth 2 (two) times daily as needed for anxiety. 6 tablet 0   amiodarone (PACERONE) 200 MG tablet Take 200 mg by mouth 2 (two) times daily.     apixaban (ELIQUIS) 5 MG TABS tablet Take 1 tablet (5 mg total) by mouth 2 (two) times daily. 60 tablet 0   diclofenac Sodium (VOLTAREN) 1 % GEL Apply 2 g topically 4 (four) times daily. 100 g 0   famotidine (PEPCID) 40 MG tablet Take 40 mg by mouth daily as needed for indigestion.  3   lidocaine (LIDODERM) 5 % Place 1 patch onto the skin daily. Remove & Discard patch within 12 hours or as directed by MD 30 patch 0   loperamide (IMODIUM A-D) 2 MG tablet Take 1 tablet (2 mg  total) by mouth as needed for diarrhea or loose stools. 30 tablet 0   Magnesium Oxide 400 MG CAPS Take 1 capsule (400 mg total) by mouth daily. 30 capsule 0   Multiple Vitamins-Minerals (ONE-A-DAY WOMENS 50+) TABS Take 1 tablet by mouth daily.     polyethylene glycol powder (GLYCOLAX/MIRALAX) 17 GM/SCOOP powder Take 17 g by mouth daily.     potassium chloride (KLOR-CON) 10 MEQ tablet Take 10 mEq by mouth 2 (two) times daily.     simvastatin (ZOCOR) 80 MG tablet Take 80 mg by mouth at bedtime.  3   Calcium Citrate 150 MG CAPS Take 2 capsules (300 mg total) by mouth daily. 60 capsule 0   FLUoxetine (PROZAC) 40 MG capsule Take 1 capsule (40 mg total) by mouth daily. 30 capsule 0   No current facility-administered medications for this visit.    Allergies  Allergen Reactions   Ace Inhibitors Other (See Comments)    07/08/21 Pt states these meds do not bother her.   Codeine Other (See Comments)    headache   Morphine And Related Other (See Comments)    Hallucinations 07/08/21 headaches   Oxycodone-Acetaminophen Nausea And Vomiting     Past Medical History:  Diagnosis Date   Anemia    Aortic stenosis    Arthritis    Atrial fibrillation ([place])    Cancer ([place]) 11/29/2010   colon   Diabetes mellitus without  complication (HCC)    GERD (gastroesophageal reflux disease)    Headache    Hypertension    Sleep apnea    has cpap, does not use cpap on regular basis    Past Surgical History:  Procedure Laterality Date   ABDOMINAL HYSTERECTOMY     partial   BREAST BIOPSY Bilateral pt unsure   benign   COLON SURGERY  2012   no chemo or radiation done   COLONOSCOPY WITH PROPOFOL N/A 01/30/2015   Procedure: COLONOSCOPY WITH PROPOFOL;  Surgeon: Juanita Craver, MD;  Location: [facility];  Service: Endoscopy;  Laterality: N/A;   JOINT REPLACEMENT     left hip replacement x 2   left wrist surgery for fracture      ORIF FEMUR FRACTURE Right 07/10/2021   Procedure: OPEN REDUCTION INTERNAL FIXATION  (ORIF) DISTAL FEMUR FRACTURE;  Surgeon: Altamese Fairfield Harbour, MD;  Location: [facility];  Service: Orthopedics;  Laterality: Right;   right hand cortisone injection     cast put on    Social History   Socioeconomic History   Marital status: Widowed    Spouse name: Not on file   Number of children: 0   Years of education: Not on file   Highest education level: Not on file  Occupational History   Not on file  Tobacco Use   Smoking status: Former    Packs/day: 0.25    Years: 10.00    Pack years: 2.50    Types: Cigarettes    Quit date: 11/29/1964    Years since quitting: 56.8   Smokeless tobacco: Never  Vaping Use   Vaping Use: Never used  Substance and Sexual Activity   Alcohol use: No   Drug use: No   Sexual activity: Not on file  Other Topics Concern   Not on file  Social History Narrative   Not on file   Social Determinants of Health   Financial Resource Strain: Not on file  Food Insecurity: Not on file  Transportation Needs: Not on file  Physical Activity: Not on file  Stress: Not on file  Social Connections: Not on file  Intimate Partner Violence: Not on file    Family History  Problem Relation Age of Onset   Hypertension Mother    Hypertension Father     ROS: no fevers or chills, productive cough, hemoptysis, dysphasia, odynophagia, melena, hematochezia, dysuria, hematuria, rash, seizure activity, orthopnea, PND, claudication. Remaining systems are negative.  Physical Exam:   Blood pressure 135/71, pulse 73, height 5\' 5"  (1.651 m), weight 176 lb (79.8 kg), SpO2 97 %.  General:  Well developed/well nourished in NAD Skin warm/dry Patient not depressed No peripheral clubbing Back-normal HEENT-normal/normal eyelids Neck supple/normal carotid upstroke bilaterally; no bruits; no JVD; no thyromegaly chest - CTA/ normal expansion CV -irregular, 2/6 systolic murmur left sternal border. Abdomen -NT/ND, no HSM, no mass, + bowel sounds, no bruit 2+ femoral pulses, no  bruits Ext-2+ edema, no chords, 2+ DP Neuro-grossly nonfocal  ECG -atrial fibrillation at a rate of 73, no significant ST changes.  Personally reviewed  A/P  1 permanent atrial fibrillation-patient is on amiodarone at present.  If plan is rate control would potentially change to beta-blockade or Cardizem long-term to decrease the potential for side effects related to amiodarone in the future.  If plan is to potentially proceed with cardioversion would continue.  She is followed by Dr. Terrence Dupont and I will leave this decision to him.  Continue apixaban.  2 acute on chronic  diastolic congestive heart failure-patient is volume overloaded with 2+ lower extremity edema bilaterally.  I have added Lasix 40 mg daily.  Check potassium and renal function in 1 week.  She will follow-up with Dr. Terrence Dupont in 4 to 6 weeks for further adjustment of diuretic regimen.  May need follow-up echocardiogram but I will leave this to him.  3 history of moderate aortic stenosis-not severe on examination today.  She will need follow-up echoes in the future and I will leave this to Dr. Terrence Dupont.  4 hypertension-blood pressure controlled.  Continue present medications.  5 obstructive sleep apnea-she does not use CPAP.  As outlined above history is somewhat difficult as patient is upset about being in this office stating she is followed by Dr. Terrence Dupont regularly.  Kirk Ruths, MD

## 2021-09-21 ENCOUNTER — Other Ambulatory Visit: Payer: Self-pay

## 2021-09-21 ENCOUNTER — Encounter: Payer: Self-pay | Admitting: Cardiology

## 2021-09-21 ENCOUNTER — Ambulatory Visit (INDEPENDENT_AMBULATORY_CARE_PROVIDER_SITE_OTHER): Payer: Medicare HMO | Admitting: Cardiology

## 2021-09-21 VITALS — BP 135/71 | HR 73 | Ht 65.0 in | Wt 176.0 lb

## 2021-09-21 DIAGNOSIS — I4819 Other persistent atrial fibrillation: Secondary | ICD-10-CM | POA: Diagnosis not present

## 2021-09-21 DIAGNOSIS — R0602 Shortness of breath: Secondary | ICD-10-CM

## 2021-09-21 DIAGNOSIS — I350 Nonrheumatic aortic (valve) stenosis: Secondary | ICD-10-CM

## 2021-09-21 DIAGNOSIS — I10 Essential (primary) hypertension: Secondary | ICD-10-CM

## 2021-09-21 DIAGNOSIS — I5033 Acute on chronic diastolic (congestive) heart failure: Secondary | ICD-10-CM | POA: Diagnosis not present

## 2021-09-21 MED ORDER — FUROSEMIDE 40 MG PO TABS: 40.0000 mg | ORAL_TABLET | Freq: Every day | ORAL | 3 refills | Status: AC

## 2021-09-21 NOTE — Patient Instructions (Signed)
Medication Instructions:   START FUROSEMIDE 40 MG ONCE DAILY  *If you need a refill on your cardiac medications before your next appointment, please call your pharmacy*   Lab Work:  Your physician recommends that you return for lab work in: [place]  If you have labs (blood work) drawn today and your tests are completely normal, you will receive your results only by: [place] (if you have MyChart) OR A paper copy in the mail If you have any lab test that is abnormal or we need to change your treatment, we will call you to review the results.   Follow-Up: At [facility], you and your health needs are our priority.  As part of our continuing mission to provide you with exceptional heart care, we have created designated Provider Care Teams.  These Care Teams include your primary Cardiologist (physician) and Advanced Practice Providers (APPs -  Physician Assistants and Nurse Practitioners) who all work together to provide you with the care you need, when you need it.  We recommend signing up for the patient portal called "MyChart".  Sign up information is provided on this After Visit Summary.  MyChart is used to connect with patients for Virtual Visits (Telemedicine).  Patients are able to view lab/test results, encounter notes, upcoming appointments, etc.  Non-urgent messages can be sent to your provider as well.   To learn more about what you can do with MyChart, go to [email or web address].    Your next appointment:    10-19-21 @ 1:30 PM WITH DR Terrence Dupont

## 2021-10-05 ENCOUNTER — Other Ambulatory Visit: Payer: Medicare HMO

## 2022-01-17 ENCOUNTER — Encounter (HOSPITAL_COMMUNITY): Payer: Self-pay

## 2022-01-17 ENCOUNTER — Emergency Department (HOSPITAL_COMMUNITY)
Admission: EM | Admit: 2022-01-17 | Discharge: 2022-01-19 | Disposition: A | Discharge status: Home / Self Care | Payer: Medicare HMO | Attending: Emergency Medicine | Admitting: Emergency Medicine

## 2022-01-17 ENCOUNTER — Other Ambulatory Visit: Payer: Self-pay

## 2022-01-17 ENCOUNTER — Emergency Department (HOSPITAL_COMMUNITY): Payer: Medicare HMO

## 2022-01-17 ENCOUNTER — Emergency Department (HOSPITAL_BASED_OUTPATIENT_CLINIC_OR_DEPARTMENT_OTHER): Payer: Medicare HMO

## 2022-01-17 DIAGNOSIS — M25551 Pain in right hip: Secondary | ICD-10-CM | POA: Diagnosis not present

## 2022-01-17 DIAGNOSIS — E119 Type 2 diabetes mellitus without complications: Secondary | ICD-10-CM | POA: Diagnosis not present

## 2022-01-17 DIAGNOSIS — I10 Essential (primary) hypertension: Secondary | ICD-10-CM | POA: Insufficient documentation

## 2022-01-17 DIAGNOSIS — M7989 Other specified soft tissue disorders: Secondary | ICD-10-CM | POA: Insufficient documentation

## 2022-01-17 DIAGNOSIS — M25561 Pain in right knee: Secondary | ICD-10-CM | POA: Diagnosis present

## 2022-01-17 DIAGNOSIS — Z79899 Other long term (current) drug therapy: Secondary | ICD-10-CM | POA: Insufficient documentation

## 2022-01-17 DIAGNOSIS — M79604 Pain in right leg: Secondary | ICD-10-CM | POA: Diagnosis not present

## 2022-01-17 DIAGNOSIS — Z7901 Long term (current) use of anticoagulants: Secondary | ICD-10-CM | POA: Diagnosis not present

## 2022-01-17 DIAGNOSIS — M79671 Pain in right foot: Secondary | ICD-10-CM

## 2022-01-17 DIAGNOSIS — R600 Localized edema: Secondary | ICD-10-CM | POA: Insufficient documentation

## 2022-01-17 LAB — CBC WITH DIFFERENTIAL/PLATELET
Abs Immature Granulocytes: 0.01 10*3/uL (ref 0.00–0.07)
Basophils Absolute: 0 10*3/uL (ref 0.0–0.1)
Basophils Relative: 1 %
Eosinophils Absolute: 0.1 10*3/uL (ref 0.0–0.5)
Eosinophils Relative: 3 %
HCT: 33.4 % — ABNORMAL LOW (ref 36.0–46.0)
Hemoglobin: 10.6 g/dL — ABNORMAL LOW (ref 12.0–15.0)
Immature Granulocytes: 0 %
Lymphocytes Relative: 37 %
Lymphs Abs: 1.4 10*3/uL (ref 0.7–4.0)
MCH: 29.4 pg (ref 26.0–34.0)
MCHC: 31.7 g/dL (ref 30.0–36.0)
MCV: 92.8 fL (ref 80.0–100.0)
Monocytes Absolute: 0.4 10*3/uL (ref 0.1–1.0)
Monocytes Relative: 10 %
Neutro Abs: 1.8 10*3/uL (ref 1.7–7.7)
Neutrophils Relative %: 49 %
Platelets: 218 10*3/uL (ref 150–400)
RBC: 3.6 MIL/uL — ABNORMAL LOW (ref 3.87–5.11)
RDW: 13.2 % (ref 11.5–15.5)
WBC: 3.7 10*3/uL — ABNORMAL LOW (ref 4.0–10.5)
nRBC: 0 % (ref 0.0–0.2)

## 2022-01-17 LAB — BASIC METABOLIC PANEL
Anion gap: 8 (ref 5–15)
BUN: 14 mg/dL (ref 8–23)
CO2: 28 mmol/L (ref 22–32)
Calcium: 8.8 mg/dL — ABNORMAL LOW (ref 8.9–10.3)
Chloride: 102 mmol/L (ref 98–111)
Creatinine, Ser: 0.99 mg/dL (ref 0.44–1.00)
GFR, Estimated: 59 mL/min — ABNORMAL LOW (ref >60)
Glucose, Bld: 102 mg/dL — ABNORMAL HIGH (ref 70–99)
Potassium: 3.3 mmol/L — ABNORMAL LOW (ref 3.5–5.1)
Sodium: 138 mmol/L (ref 135–145)

## 2022-01-17 LAB — BRAIN NATRIURETIC PEPTIDE: B Natriuretic Peptide: 194.5 pg/mL — ABNORMAL HIGH (ref 0.0–100.0)

## 2022-01-17 IMAGING — DX DG FOOT COMPLETE 3+V*R*
3 series · 3 of 3 positions shown · non-contrast
Comparison: Right ankle series [DATE].

CLINICAL DATA: 75-year-old female with right lower extremity and
swelling since [NE] hours yesterday. Prior knee replacement.

EXAM:
RIGHT FOOT COMPLETE - 3+ VIEW

[foot lat]
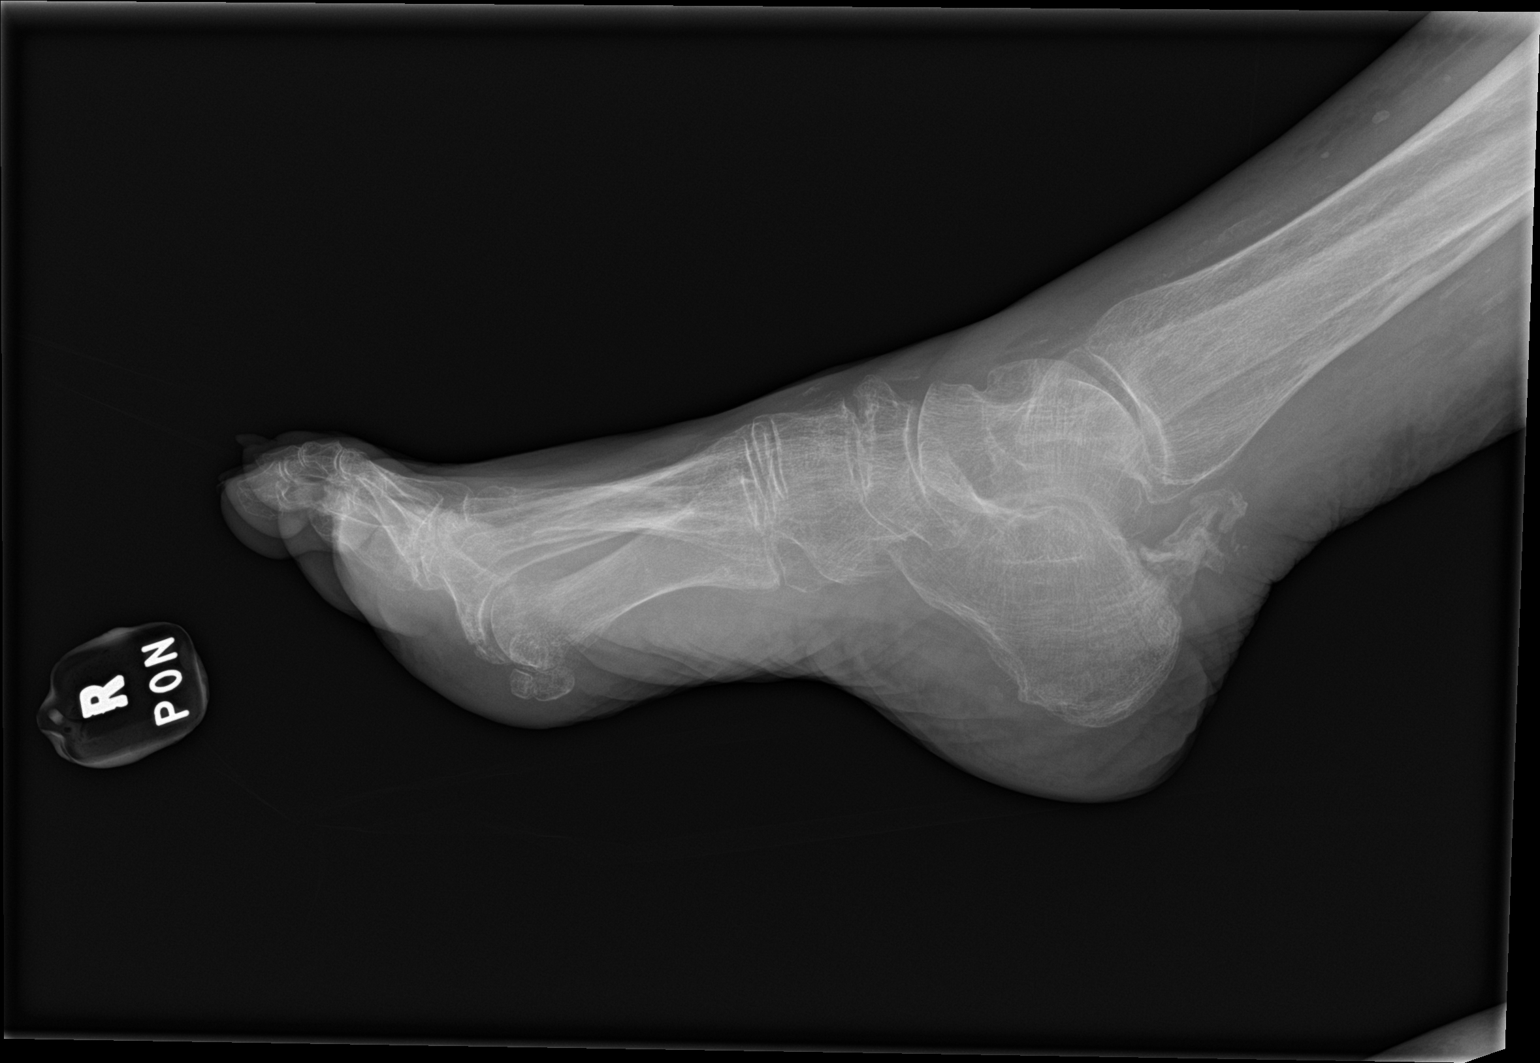

[foot obl]
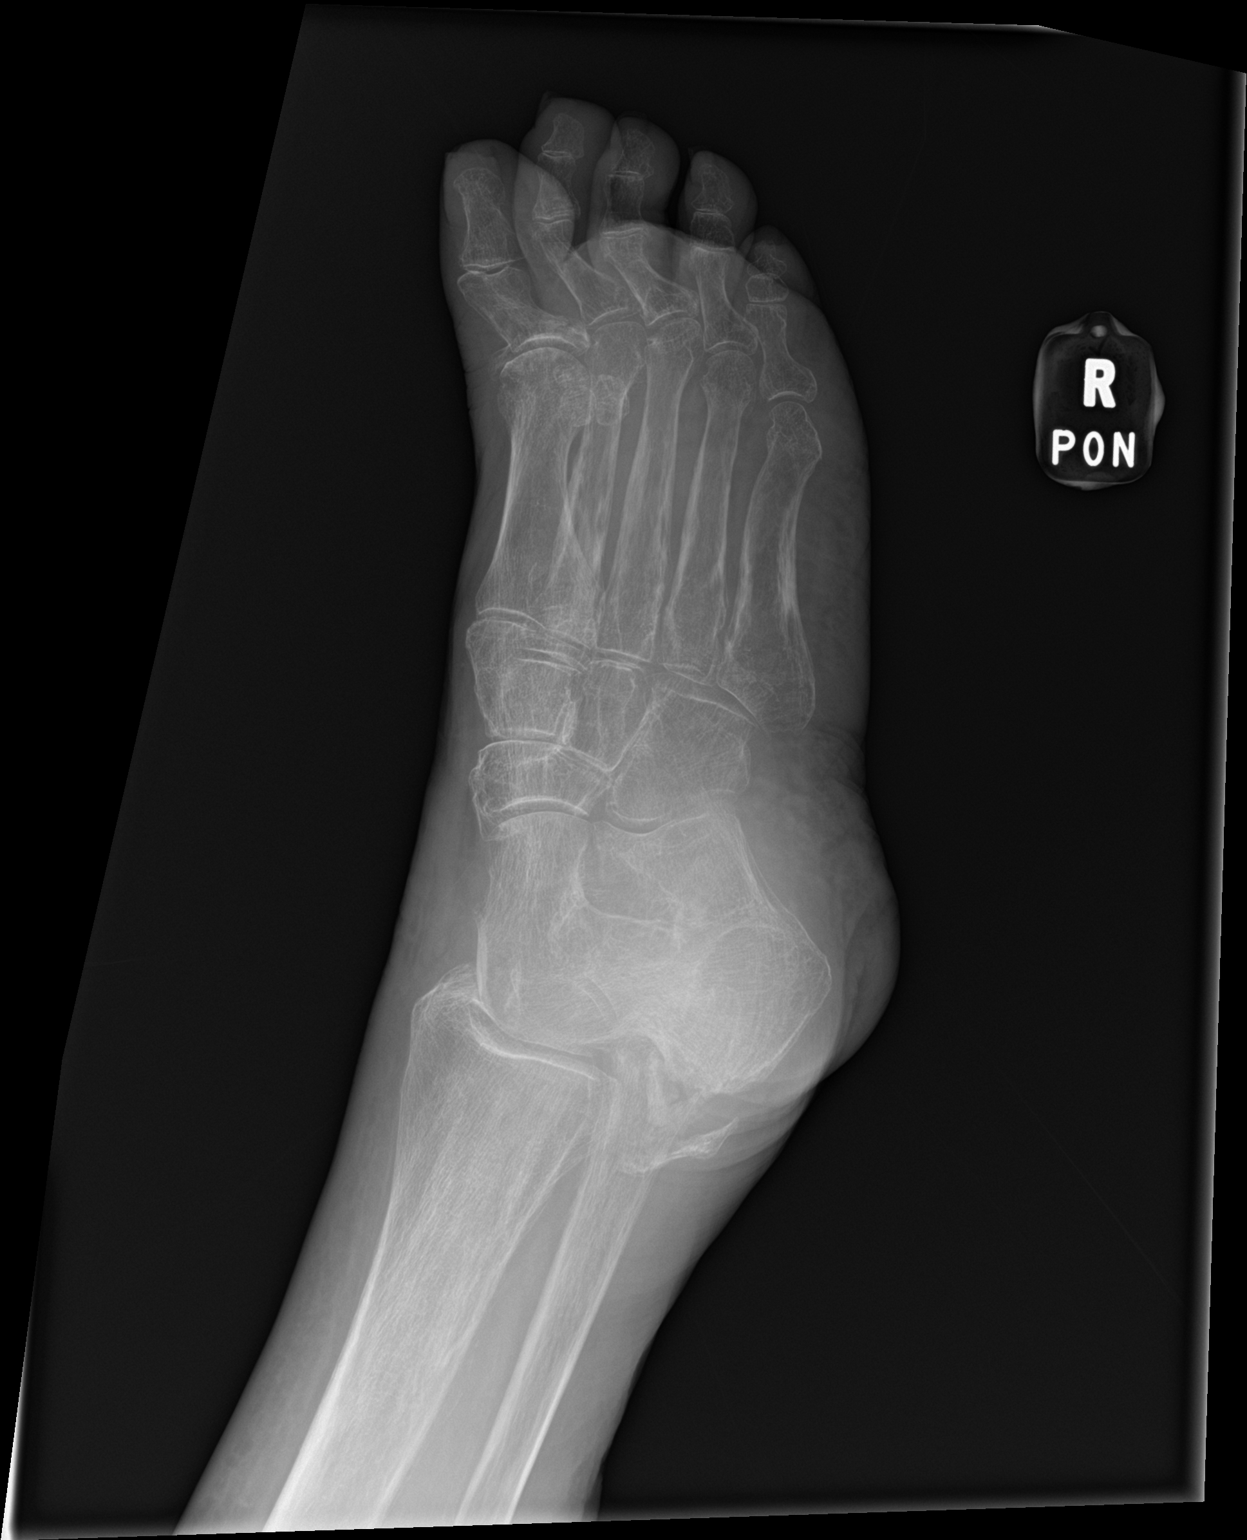

[foot ap]
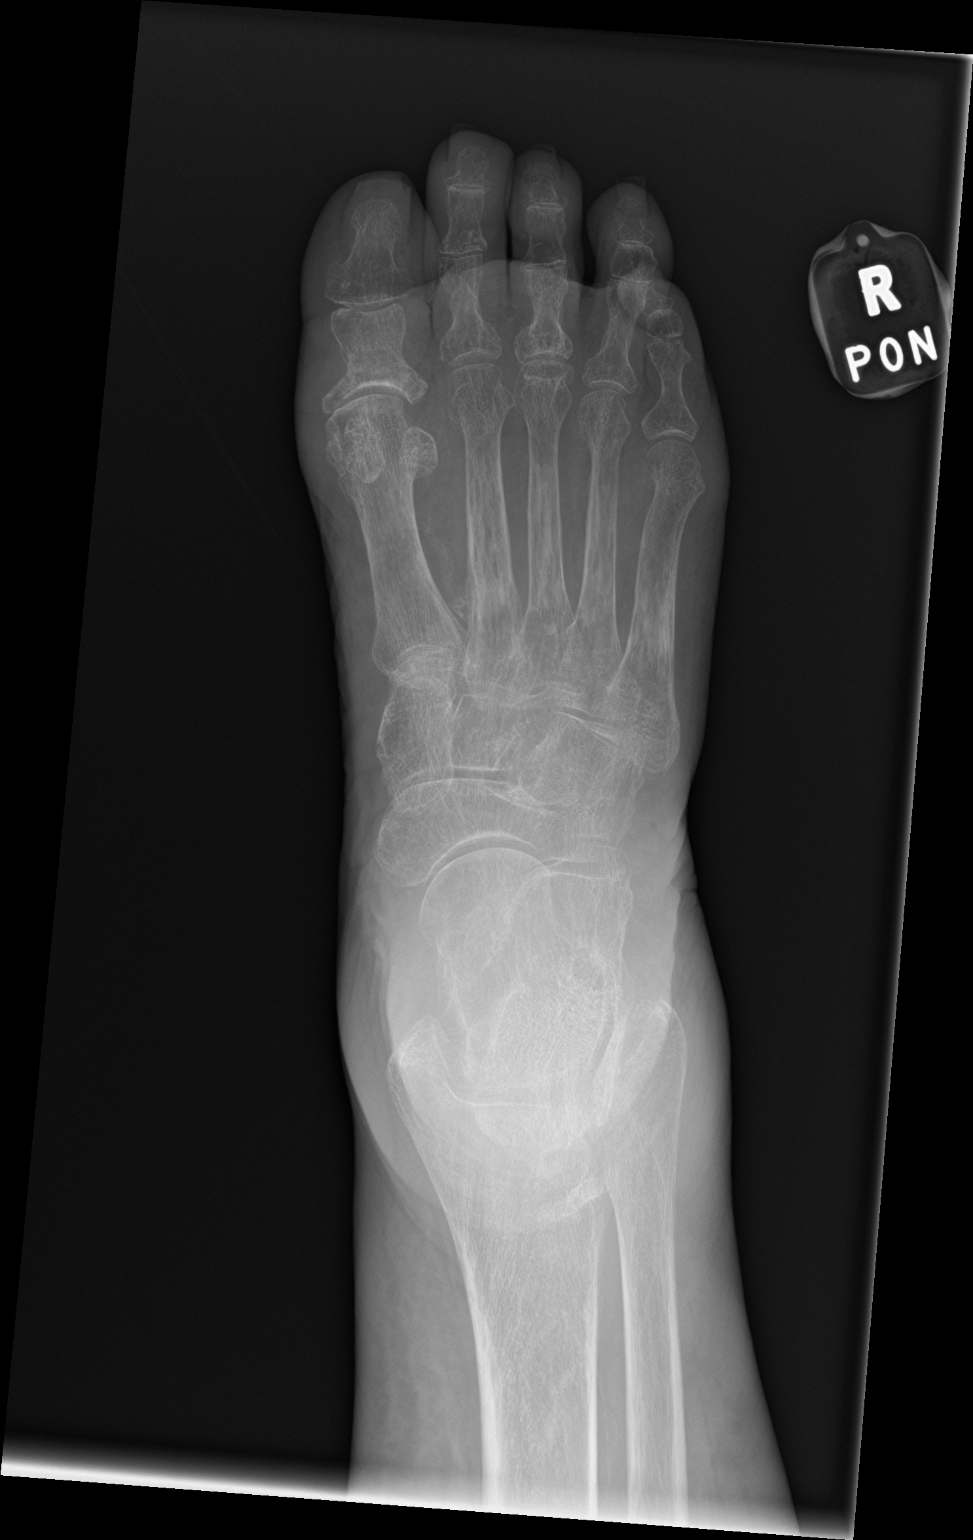

[3 of 3 positions shown; findings below may reference images not displayed]

FINDINGS: New osteopenia about the right foot and ankle since [NE], appears
fairly advanced. First MTP joint space loss and osteophytosis but no
definite acute fracture of the right 1st ray. Other metatarsals,
phalanges, and midfoot also appear intact. Dorsal to the posterior
calcaneus there is bulky 2.7 cm area of dystrophic calcification
which is new from [NE] and appears related to the distal Achilles.
No definite acute osseous abnormality. No soft tissue gas.
IMPRESSION: 1. Advanced osteopenia in the right foot. No acute osseous
abnormality identified.
2. Bulky dystrophic calcification at the distal Achilles is new
since [NE], posttraumatic and/or degenerative.

## 2022-01-17 IMAGING — DX DG KNEE COMPLETE 4+V*R*
4 series · 4 of 4 positions shown · non-contrast
Comparison: Right knee series [DATE].

CLINICAL DATA: 75-year-old female with right lower extremity and
swelling since [LD] hours yesterday.

EXAM:
RIGHT KNEE - COMPLETE 4+ VIEW

[knee ap]
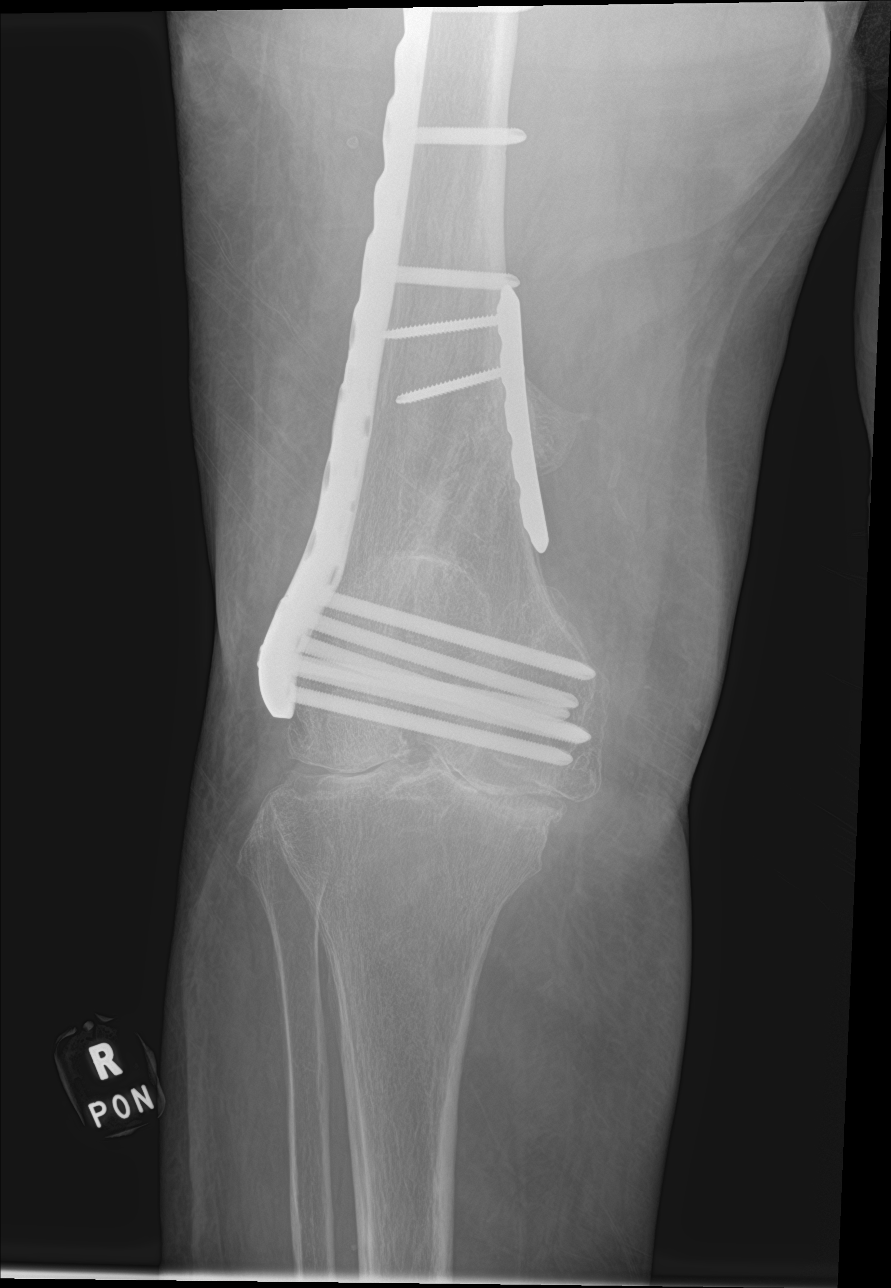

[knee lat]
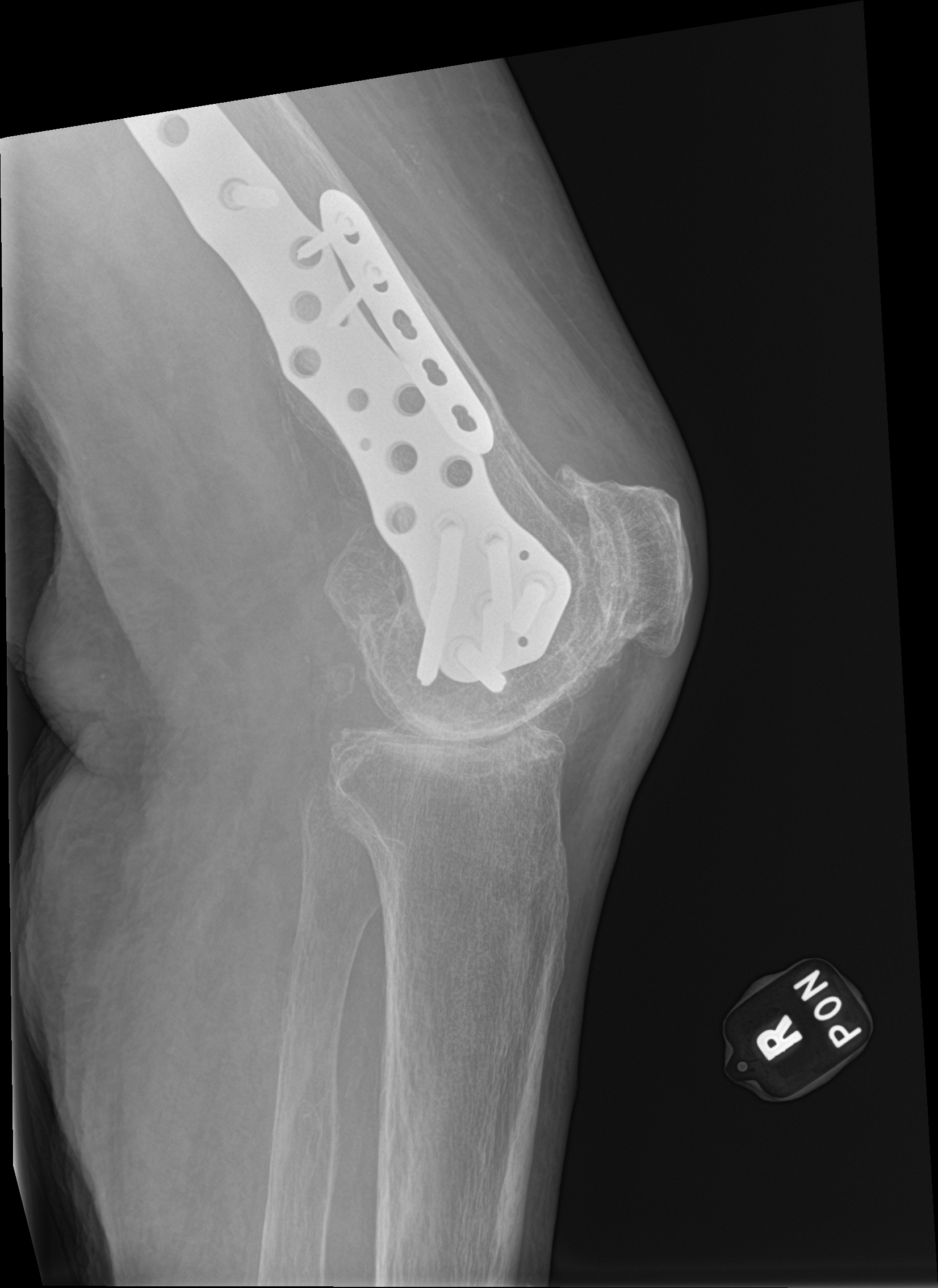

[knee obl (1 of 2)]
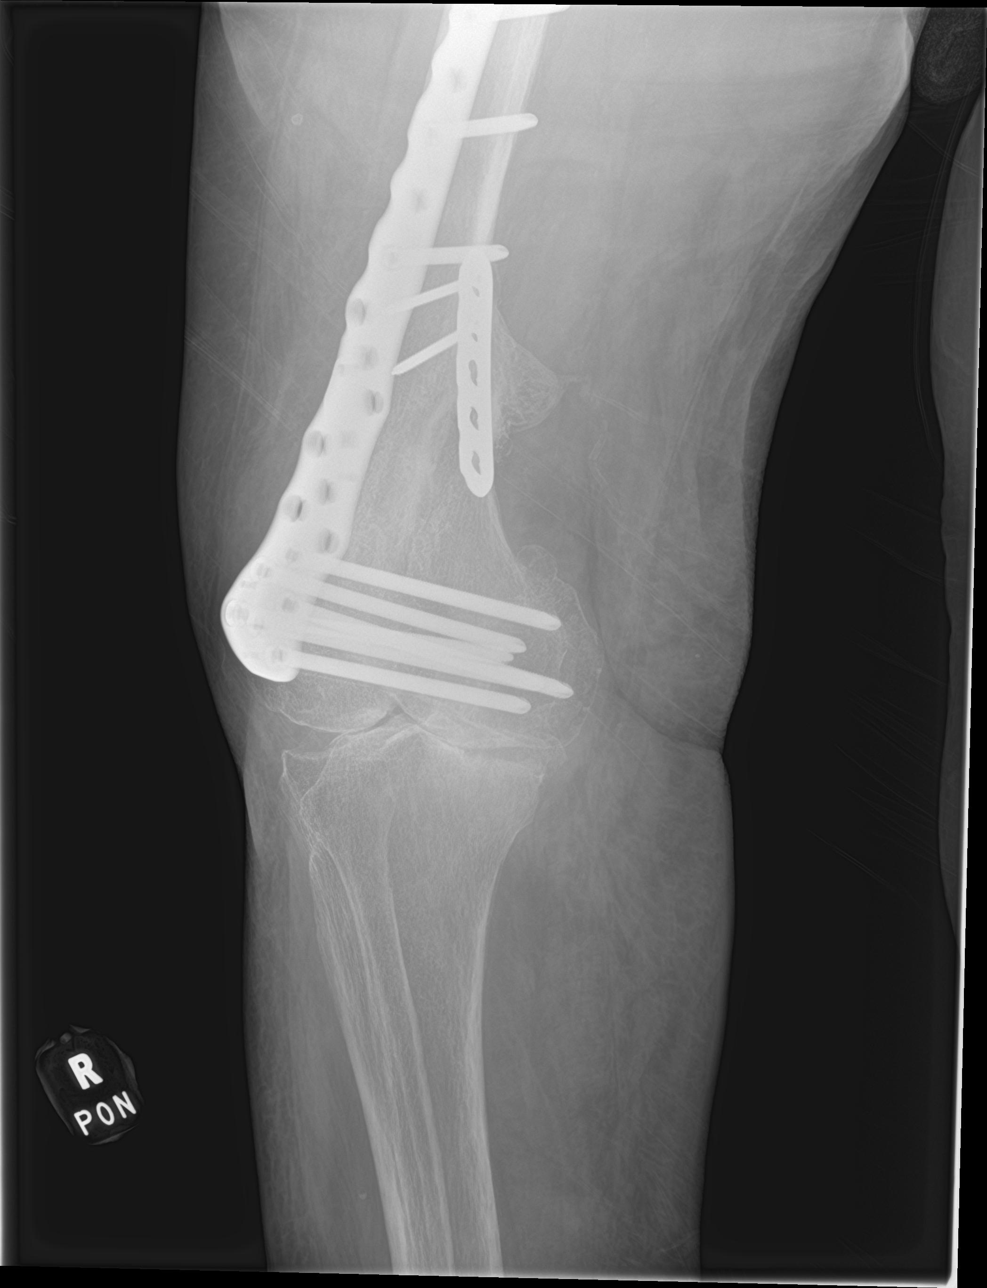

[knee obl (2 of 2)]
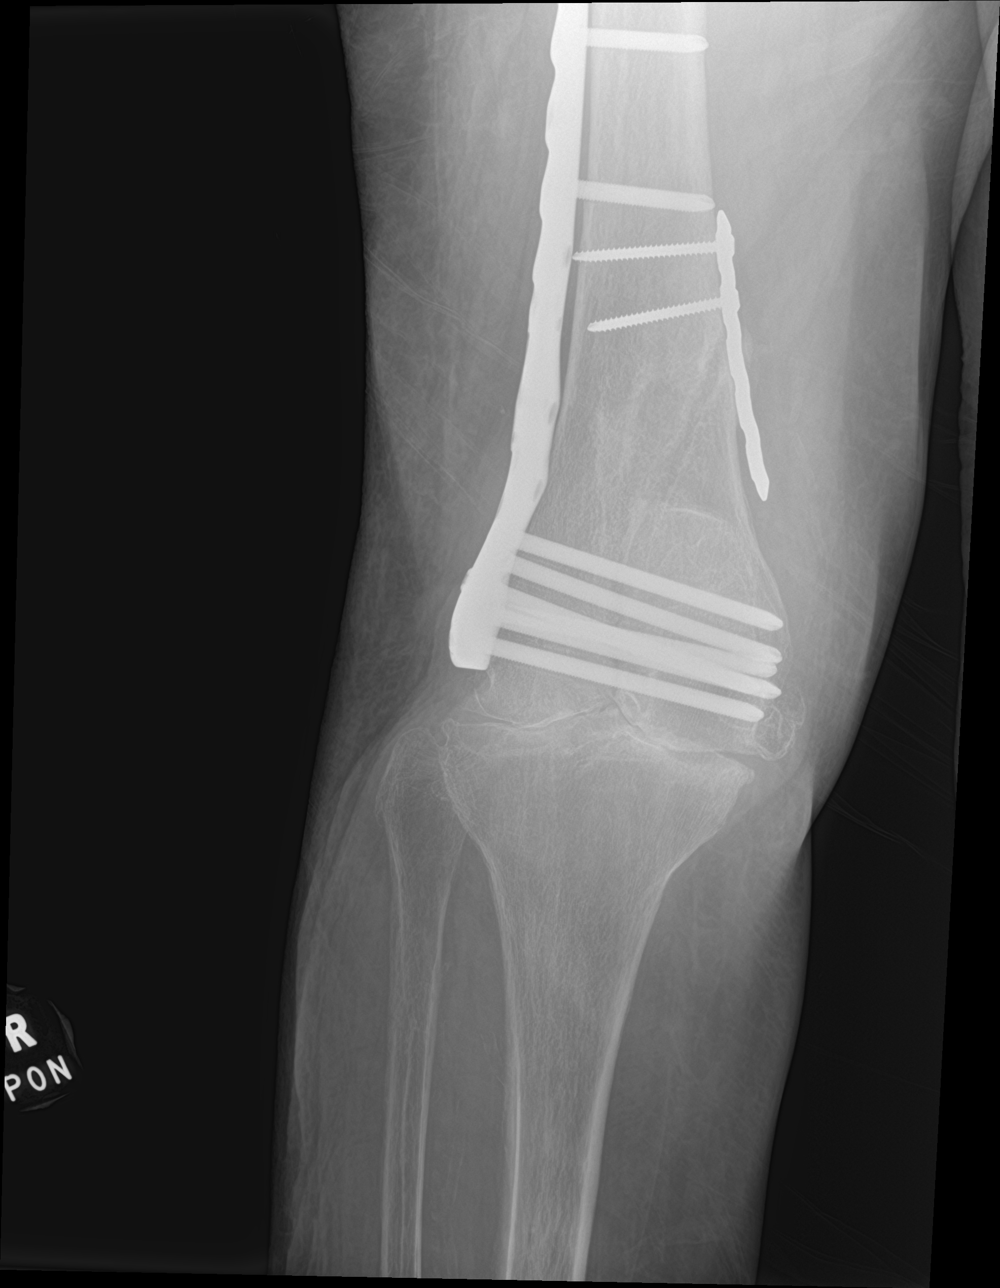

[4 of 4 positions shown; findings below may reference images not displayed]

FINDINGS: Previous ORIF right femur with both lateral and medial hardware and
numerous anchoring screws and cannula. Proximal most hardware is not
included today, but that visible appears stable and intact since
[REDACTED].

Interval healing of oblique distal femoral medial metadiaphysis
fracture with new callus formation. Superimposed chronic severe
joint space loss at the right knee is tricompartmental. Bulky
osteophytosis including in the patellofemoral compartment. Small if
any joint effusion. No acute osseous abnormality identified. No soft
tissue gas.
IMPRESSION: 1. Extensive prior femur ORIF. Interval healing of distal medial
metadiaphysis fracture with new callus formation since [DATE]. Severe tricompartmental degenerative changes at the right knee.
3. Possible small joint effusion but no acute osseous abnormality
identified.

## 2022-01-17 IMAGING — DX DG HIP (WITH OR WITHOUT PELVIS) 2-3V*R*
2 series · 2 of 2 positions shown · non-contrast
Comparison: Right hip series [DATE].

CLINICAL DATA: 75-year-old female with right lower extremity and
swelling since [AI] hours yesterday.

EXAM:
DG HIP (WITH OR WITHOUT PELVIS) 2-3V RIGHT

[pelvis ap]
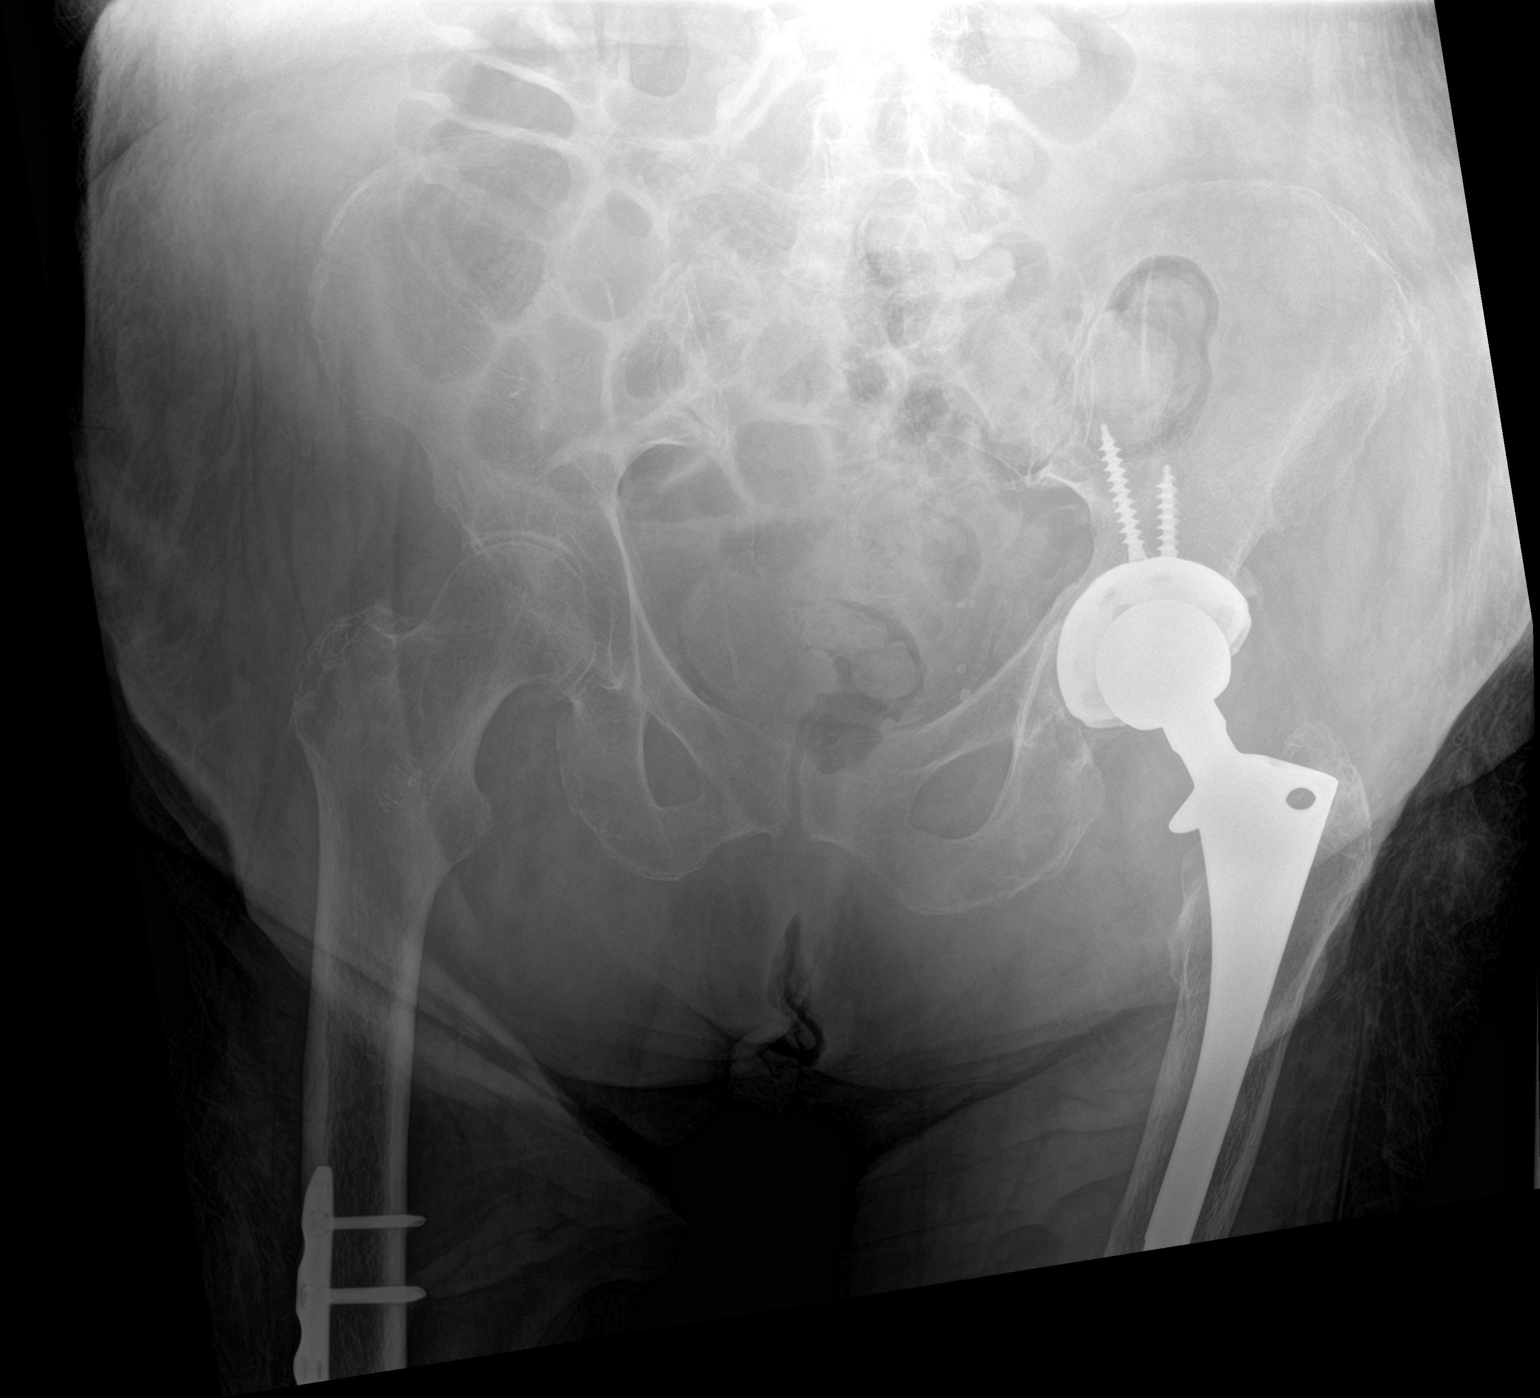

[hip lat]
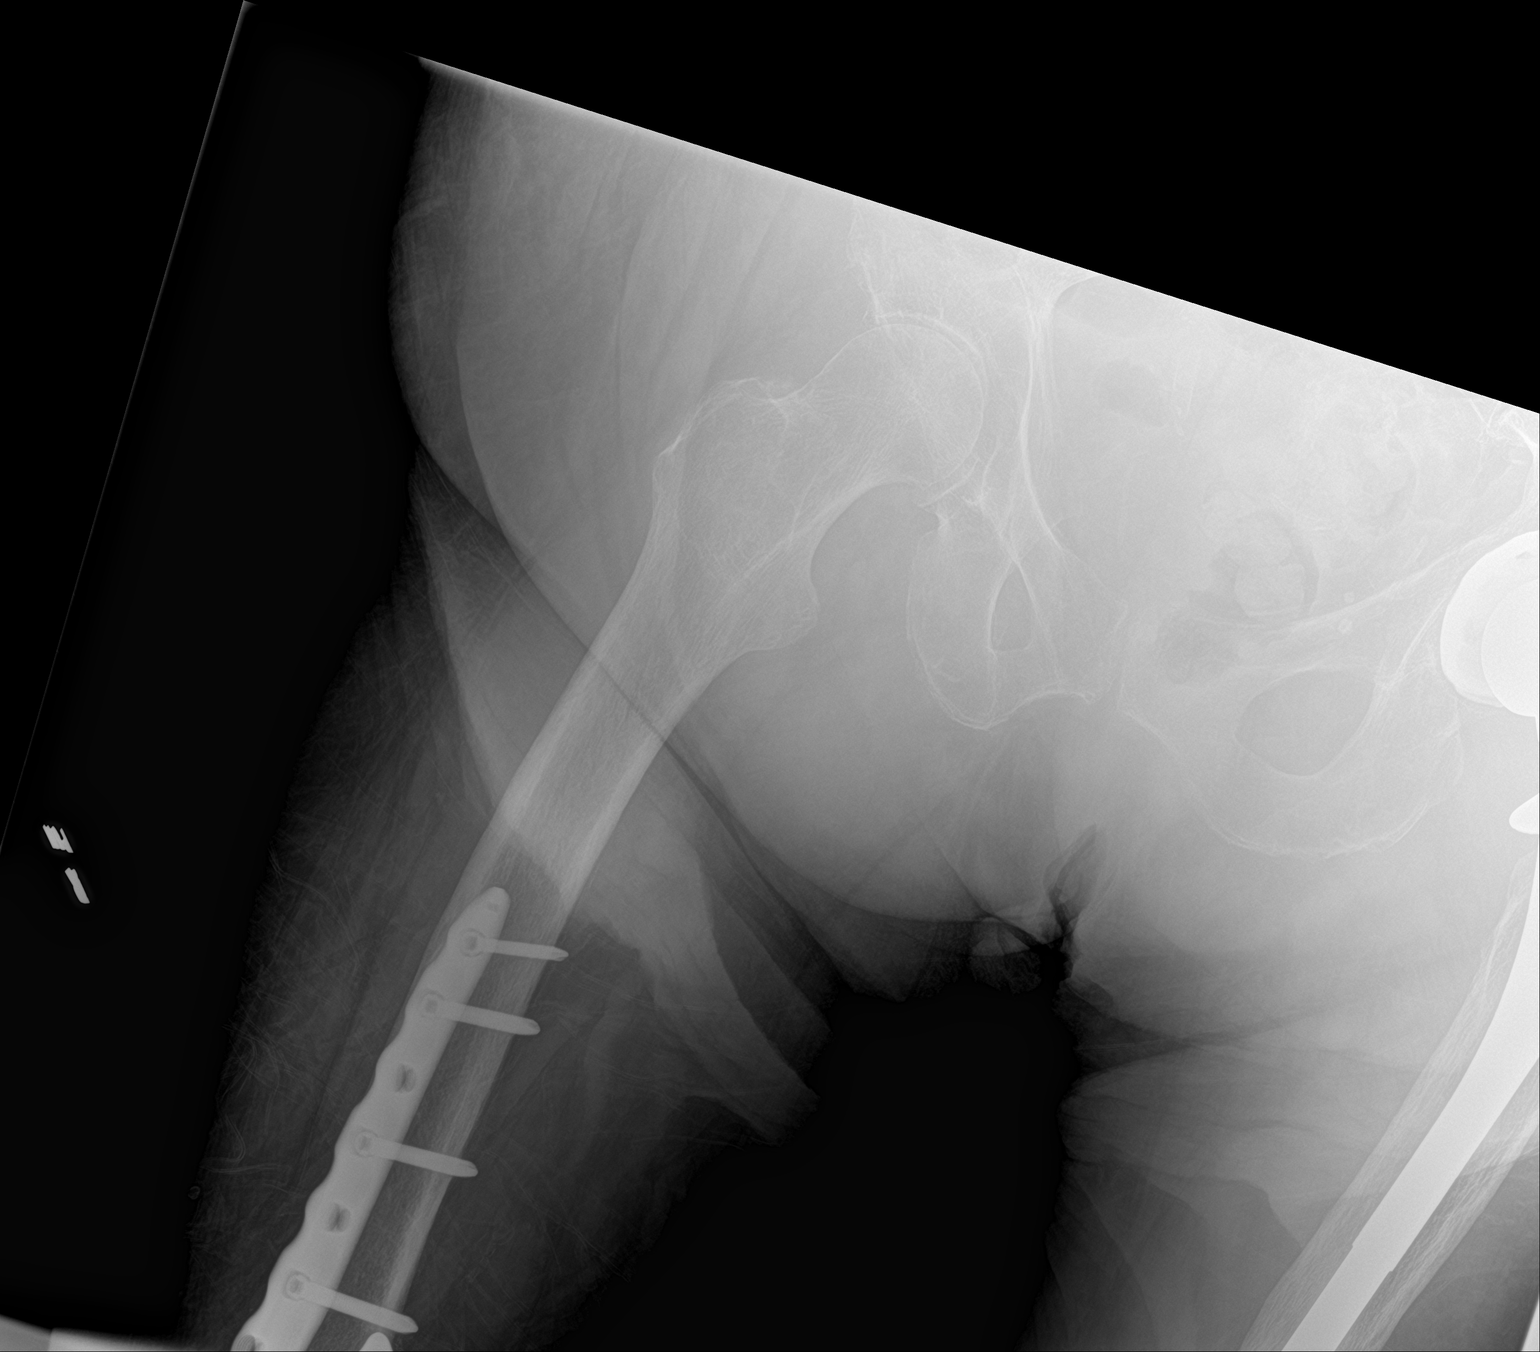

[2 of 2 positions shown; findings below may reference images not displayed]

FINDINGS: Chronic left total hip arthroplasty. Femoral heads remain normally
located. Pelvis osteopenia. No acute pelvic fracture identified.
Grossly intact visible left femur. Proximal right femur and visible
right femoral hardware appears intact. Nonobstructed bowel gas
pattern. Chronic pelvic phleboliths.
IMPRESSION: No acute fracture or dislocation identified about the right hip or
pelvis. Previous right femur ORIF, left total hip arthroplasty.

## 2022-01-17 MED ORDER — FUROSEMIDE 20 MG PO TABS
40.0000 mg | ORAL_TABLET | Freq: Every day | ORAL | Status: DC
  Administered 2022-01-17 – 2022-01-19 (×3): 40 mg via ORAL
  Filled 2022-01-17 (×3): qty 2

## 2022-01-17 MED ORDER — ESCITALOPRAM OXALATE 10 MG PO TABS
10.0000 mg | ORAL_TABLET | Freq: Every day | ORAL | Status: DC
  Administered 2022-01-17 – 2022-01-19 (×3): 10 mg via ORAL
  Filled 2022-01-17 (×3): qty 1

## 2022-01-17 MED ORDER — POTASSIUM CHLORIDE ER 10 MEQ PO TBCR
10.0000 meq | EXTENDED_RELEASE_TABLET | ORAL | Status: DC
  Administered 2022-01-18: 10 meq via ORAL
  Filled 2022-01-17: qty 1

## 2022-01-17 MED ORDER — ACETAMINOPHEN 325 MG PO TABS
650.0000 mg | ORAL_TABLET | Freq: Four times a day (QID) | ORAL | Status: DC | PRN
  Administered 2022-01-17 – 2022-01-19 (×3): 650 mg via ORAL
  Filled 2022-01-17 (×3): qty 2

## 2022-01-17 MED ORDER — MAGNESIUM OXIDE -MG SUPPLEMENT 400 (240 MG) MG PO TABS
400.0000 mg | ORAL_TABLET | Freq: Every day | ORAL | Status: DC
  Administered 2022-01-17 – 2022-01-19 (×3): 400 mg via ORAL
  Filled 2022-01-17 (×3): qty 1

## 2022-01-17 MED ORDER — AMIODARONE HCL 200 MG PO TABS
200.0000 mg | ORAL_TABLET | Freq: Two times a day (BID) | ORAL | Status: DC
  Administered 2022-01-17 – 2022-01-19 (×4): 200 mg via ORAL
  Filled 2022-01-17 (×5): qty 1

## 2022-01-17 MED ORDER — GABAPENTIN 100 MG PO CAPS
100.0000 mg | ORAL_CAPSULE | Freq: Every day | ORAL | Status: DC
  Administered 2022-01-17 – 2022-01-19 (×3): 100 mg via ORAL
  Filled 2022-01-17 (×3): qty 1

## 2022-01-17 MED ORDER — TRAMADOL HCL 50 MG PO TABS
50.0000 mg | ORAL_TABLET | Freq: Two times a day (BID) | ORAL | Status: DC | PRN
  Administered 2022-01-17 – 2022-01-18 (×3): 50 mg via ORAL
  Filled 2022-01-17 (×3): qty 1

## 2022-01-17 MED ORDER — BUSPIRONE HCL 10 MG PO TABS
10.0000 mg | ORAL_TABLET | Freq: Two times a day (BID) | ORAL | Status: DC
  Administered 2022-01-17 – 2022-01-19 (×5): 10 mg via ORAL
  Filled 2022-01-17 (×5): qty 1

## 2022-01-17 MED ORDER — FAMOTIDINE 20 MG PO TABS: 40.0000 mg | ORAL_TABLET | Freq: Every day | ORAL | Status: DC | PRN

## 2022-01-17 MED ORDER — TORSEMIDE 10 MG PO TABS
5.0000 mg | ORAL_TABLET | Freq: Every day | ORAL | Status: DC
  Administered 2022-01-17 – 2022-01-19 (×3): 5 mg via ORAL
  Filled 2022-01-17 (×4): qty 0.5

## 2022-01-17 MED ORDER — ALPRAZOLAM 0.25 MG PO TABS: 0.2500 mg | ORAL_TABLET | Freq: Two times a day (BID) | ORAL | Status: DC | PRN

## 2022-01-17 MED ORDER — LOPERAMIDE HCL 2 MG PO TABS: 2.0000 mg | ORAL_TABLET | ORAL | Status: DC | PRN

## 2022-01-17 MED ORDER — LOPERAMIDE HCL 2 MG PO CAPS: 2.0000 mg | ORAL_CAPSULE | ORAL | Status: DC | PRN

## 2022-01-17 MED ORDER — APIXABAN 5 MG PO TABS
5.0000 mg | ORAL_TABLET | Freq: Two times a day (BID) | ORAL | Status: DC
  Administered 2022-01-17 – 2022-01-19 (×5): 5 mg via ORAL
  Filled 2022-01-17 (×5): qty 1

## 2022-01-17 MED ORDER — ATORVASTATIN CALCIUM 40 MG PO TABS
40.0000 mg | ORAL_TABLET | Freq: Every day | ORAL | Status: DC
  Administered 2022-01-17 – 2022-01-19 (×3): 40 mg via ORAL
  Filled 2022-01-17 (×3): qty 1

## 2022-01-17 MED ORDER — FENTANYL CITRATE PF 50 MCG/ML IJ SOSY
50.0000 ug | PREFILLED_SYRINGE | Freq: Once | INTRAMUSCULAR | Status: AC
  Administered 2022-01-17: 50 ug via INTRAVENOUS
  Filled 2022-01-17: qty 1

## 2022-01-17 MED ORDER — MENTHOL (TOPICAL ANALGESIC) 5 % EX PTCH: MEDICATED_PATCH | CUTANEOUS | Status: DC | PRN

## 2022-01-17 MED ORDER — LIDOCAINE 5 % EX PTCH
1.0000 | MEDICATED_PATCH | Freq: Once | CUTANEOUS | Status: AC
  Administered 2022-01-17: 1 via TRANSDERMAL
  Filled 2022-01-17: qty 1

## 2022-01-17 MED ORDER — MAGNESIUM OXIDE 400 MG PO CAPS: 400.0000 mg | ORAL_CAPSULE | Freq: Every day | ORAL | Status: DC

## 2022-01-17 MED ORDER — FERROUS GLUCONATE 324 (38 FE) MG PO TABS
324.0000 mg | ORAL_TABLET | Freq: Every day | ORAL | Status: DC
  Administered 2022-01-17 – 2022-01-19 (×3): 324 mg via ORAL
  Filled 2022-01-17 (×4): qty 1

## 2022-01-17 MED ORDER — METHOCARBAMOL 500 MG PO TABS
500.0000 mg | ORAL_TABLET | Freq: Once | ORAL | Status: AC
  Administered 2022-01-17: 500 mg via ORAL
  Filled 2022-01-17: qty 1

## 2022-01-17 MED ORDER — FENTANYL CITRATE PF 50 MCG/ML IJ SOSY
25.0000 ug | PREFILLED_SYRINGE | Freq: Once | INTRAMUSCULAR | Status: AC
  Administered 2022-01-17: 25 ug via INTRAVENOUS
  Filled 2022-01-17: qty 1

## 2022-01-17 NOTE — ED Notes (Signed)
Per family pt had surgery on rt leg back in 08/22 and that she was just d/c home from a nursing facility and has not ambulated since she has been home she has not ambulated and she lives alone

## 2022-01-17 NOTE — ED Notes (Signed)
Lunch tray ordered for pt.

## 2022-01-17 NOTE — ED Notes (Signed)
Provider at bedside at this time

## 2022-01-17 NOTE — Evaluation (Signed)
Physical Therapy Evaluation Patient Details Name: [name] [name] MRN: [id number] DOB: August 20, 1946 Today's Date: 01/17/2022  History of Present Illness  Patient is a 76 y/o female admitted due to R LE pain and weakness.  She was here for R distal femur fx 8/22 s/p ORIF.  She went to SNF and has been home about a few weeks prior to admission.  PMH positive for DM, HTN, anxiety, anemia, obseity, GERD, sleep apnea, colon CA, a-fib.  Clinical Impression  Patient presents with decreased mobility due to pain R LE and significant fear of falling.  She was previously in a nursing facility, then transitioned home recently functioning on her own with help from sister for groceries until unable to tolerate mobility with pain in R LE.  She will benefit from skilled PT in the acute setting and will need to return to SNF prior to return home hopeful to set up aide help.        Recommendations for follow up therapy are one component of a multi-disciplinary discharge planning process, led by the attending physician.  Recommendations may be updated based on patient status, additional functional criteria and insurance authorization.  Follow Up Recommendations Skilled nursing-short term rehab (<3 hours/day)    Assistance Recommended at Discharge Frequent or constant Supervision/Assistance  Patient can return home with the following  Two people to help with walking and/or transfers;A lot of help with bathing/dressing/bathroom;Direct supervision/assist for medications management;Help with stairs or ramp for entrance    Equipment Recommendations None recommended by PT  Recommendations for Other Services       Functional Status Assessment Patient has had a recent decline in their functional status and demonstrates the ability to make significant improvements in function in a reasonable and predictable amount of time.     Precautions / Restrictions Precautions Precautions: Fall      Mobility  Bed  Mobility Overal bed mobility: Needs Assistance Bed Mobility: Rolling, Sidelying to Sit, Sit to Supine Rolling: Min assist Sidelying to sit: Max assist   Sit to supine: Max assist   General bed mobility comments: rolled on stretcher to change brief as soiled; side to sit assist for legs and trunk and to supine for both as well    Transfers Overall transfer level: Needs assistance Equipment used: Rolling walker (2 wheels) Transfers: Sit to/from Stand Sit to Stand: From elevated surface, Mod assist           General transfer comment: up from higher stretcher in ED, needed assist for shoes on and to place feet under her prior to standing    Ambulation/Gait Ambulation/Gait assistance: Min assist, Mod assist Gait Distance (Feet): 3 Feet Assistive device: Rolling walker (2 wheels) Gait Pattern/deviations: Step-to pattern, Decreased stride length, Trunk flexed       General Gait Details: very fearful and even with help unwilling to take steps forward so assisted to side step toward [facility] prior to returning to supine  Stairs            Wheelchair Mobility    Modified Rankin (Stroke Patients Only)       Balance Overall balance assessment: Needs assistance Sitting-balance support: Feet supported Sitting balance-Leahy Scale: Poor Sitting balance - Comments: mod A initially sitting EOB till I was able to don her shoes and get her feet on the floor she was very fearful of falling Postural control: Posterior lean Standing balance support: Bilateral upper extremity supported Standing balance-Leahy Scale: Poor Standing balance comment: UE support and min A for  static balance                             Pertinent Vitals/Pain Pain Assessment Pain Assessment: 0-10 Pain Score: 8  Pain Location: R knee, thigh Pain Descriptors / Indicators: Aching, Grimacing, Tightness Pain Intervention(s): Monitored during session, Repositioned    Home Living Family/patient  expects to be discharged to:: Private residence Living Arrangements: Alone   Type of Home: House Home Access: Ramped entrance       Home Layout: One level Home Equipment: Conservation officer, nature (2 wheels);BSC/3in1 Additional Comments: has been sponge bathing, sister gets groceries    Prior Function Prior Level of Function : Needs assist                     Hand Dominance   Dominant Hand: Right    Extremity/Trunk Assessment   Upper Extremity Assessment Upper Extremity Assessment: Generalized weakness    Lower Extremity Assessment Lower Extremity Assessment: RLE deficits/detail;LLE deficits/detail RLE Deficits / Details: knee flexion AAROM about 50 with pain in knee, ankle DF limited about 20 degrees from neutral; strength hip flexion 3/5, knee extension 4-/5, ankle DF 4/5 LLE Deficits / Details: AROM WFL except ankle DF limited 20  degrees from neutral, strength 4/5 throughout       Communication   Communication: No difficulties  Cognition Arousal/Alertness: Awake/alert Behavior During Therapy: Anxious Overall Cognitive Status: Within Functional Limits for tasks assessed                                 General Comments: very fearful        General Comments General comments (skin integrity, edema, etc.): VSS throughout    Exercises     Assessment/Plan    PT Assessment Patient needs continued PT services  PT Problem List Decreased strength;Decreased mobility;Decreased safety awareness;Decreased activity tolerance;Decreased balance;Pain;Decreased knowledge of use of DME;Decreased range of motion       PT Treatment Interventions Gait training;DME instruction;Therapeutic activities;Therapeutic exercise;Patient/family education;Balance training;Functional mobility training    PT Goals (Current goals can be found in the Care Plan section)  Acute Rehab PT Goals Patient Stated Goal: to go to rehab again, but worried she will lose her home PT Goal  Formulation: With patient Time For Goal Achievement: 01/31/22 Potential to Achieve Goals: Fair    Frequency Min 2X/week     Co-evaluation               AM-PAC PT "6 Clicks" Mobility  Outcome Measure Help needed turning from your back to your side while in a flat bed without using bedrails?: A Lot Help needed moving from lying on your back to sitting on the side of a flat bed without using bedrails?: Total Help needed moving to and from a bed to a chair (including a wheelchair)?: Total Help needed standing up from a chair using your arms (e.g., wheelchair or bedside chair)?: Total Help needed to walk in hospital room?: Total Help needed climbing 3-5 steps with a railing? : Total 6 Click Score: 7    End of Session Equipment Utilized During Treatment: Gait belt Activity Tolerance: Patient limited by fatigue Patient left: in bed;with call bell/phone within reach   PT Visit Diagnosis: Other abnormalities of gait and mobility (R26.89);Muscle weakness (generalized) (M62.81);Other symptoms and signs involving the nervous system (O16.073)    Time: 7106-2694 PT Time Calculation (min) (  ACUTE ONLY): 35 min   Charges:   PT Evaluation $PT Eval High Complexity: 1 High PT Treatments $Therapeutic Activity: 8-22 mins        Magda Kiel, PT Acute Rehabilitation Services EQJEA:[phone] [phone] 01/17/2022   Reginia Naas 01/17/2022, 2:54 PM

## 2022-01-17 NOTE — ED Triage Notes (Signed)
Pt BIB [facility] from home c/o right leg pain that started yesterday around 3pm. Pt states she also noticed some swelling. Pt  is unable to bear weight at this time. Pt had a knee replacement back in sept.

## 2022-01-17 NOTE — ED Provider Notes (Signed)
Care of the patient assumed at the change of shift.  Physical Exam  BP (!) 112/52 (BP Location: Right Arm)    Pulse 66    Temp 98.3 F (36.8 C) (Oral)    Resp 16    Ht 5' (1.524 m)    Wt 85.3 kg    SpO2 95%    BMI 36.72 kg/m   Physical Exam  Procedures  Procedures  ED Course / MDM   Clinical Course as of 01/17/22 0944  Sun Jan 17, 2022  0800 Doppler is neg for DVT on prelim report.  [CS]    Clinical Course User Index [CS] Truddie Hidden, MD   Medical Decision Making Amount and/or Complexity of Data Reviewed Labs: ordered. Radiology: ordered. ECG/medicine tests: ordered.  Risk Prescription drug management.   Patient and family at bedside state she is unable to care for herself at home and family is not available to help long term. She would like to go back to SNF. No indication for medical admission at this time. TOC is on board.        Truddie Hidden, MD 01/17/22 [phone]

## 2022-01-17 NOTE — Progress Notes (Signed)
VASCULAR LAB    Right lower extremity venous duplex has been performed.  See CV proc for preliminary results.  Messaged results to Dr. Karle Starch via secure chat   Sharion Dove, RVT 01/17/2022, 7:36 AM

## 2022-01-17 NOTE — ED Notes (Signed)
Pt ambulated per providers request. Pt used a walker but had a difficult time ambulating. Stated that " she couldn't move her leg and that we wouldn't be there to help hold her at home to prevent her from falling". Provider made aware of same

## 2022-01-17 NOTE — ED Notes (Signed)
Transported to radiology 

## 2022-01-17 NOTE — ED Provider Notes (Signed)
[facility] EMERGENCY DEPARTMENT Provider Note   CSN: [id number] Arrival date & time: 01/17/22  0356     History  Chief Complaint  Patient presents with   Leg Pain    [name] [name] is a 76 y.o. female.  Presented to the emergency department with concern for leg pain.  Patient states that she deals with intermittent pain in her right leg, over the past day or so seems to be getting worse.  Pain today primarily in right foot and right knee.  Normally ambulates with walker.  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  On anticoagulation.  Patient endorses compliance with her anticoagulation.  HPI     Home Medications Prior to Admission medications   Medication Sig Start Date End Date Taking? Authorizing Provider  acetaminophen (TYLENOL) 650 MG CR tablet Take 1,300 mg by mouth every 8 (eight) hours as needed for pain.   Yes [provider]  ALPRAZolam (XANAX) 0.25 MG tablet Take 1 tablet (0.25 mg total) by mouth 2 (two) times daily as needed for anxiety. 04/16/21  Yes Geradine Girt, DO  amiodarone (PACERONE) 200 MG tablet Take 200 mg by mouth 2 (two) times daily. 03/23/21  Yes [provider]  apixaban (ELIQUIS) 5 MG TABS tablet Take 1 tablet (5 mg total) by mouth 2 (two) times daily. 02/08/21  Yes Lavina Hamman, MD  busPIRone (BUSPAR) 10 MG tablet Take 10 mg by mouth 2 (two) times daily. 01/01/22  Yes [provider]  escitalopram (LEXAPRO) 10 MG tablet Take 10 mg by mouth daily. 01/01/22  Yes [provider]  famotidine (PEPCID) 40 MG tablet Take 40 mg by mouth daily as needed for indigestion. 11/16/14  Yes [provider]  ferrous gluconate (FERGON) 324 MG tablet Take 324 mg by mouth daily. 01/01/22  Yes [provider]  furosemide (LASIX) 40 MG tablet Take 1 tablet (40 mg total) by mouth daily. 09/21/21 01/17/22 Yes Lelon Perla, MD  gabapentin  (NEURONTIN) 100 MG capsule Take 100 mg by mouth daily. 01/01/22  Yes [provider]  loperamide (IMODIUM A-D) 2 MG tablet Take 1 tablet (2 mg total) by mouth as needed for diarrhea or loose stools. 07/17/21  Yes Timothy Lasso, MD  Magnesium Oxide 400 MG CAPS Take 1 capsule (400 mg total) by mouth daily. 02/08/21  Yes Lavina Hamman, MD  Menthol, Topical Analgesic, (ICY HOT BACK EX) Apply 1 application topically as needed (arthritis pain).   Yes [provider]  potassium chloride (KLOR-CON) 10 MEQ tablet Take 10 mEq by mouth every Monday, Wednesday, and Friday. 06/23/21  Yes [provider]  simvastatin (ZOCOR) 80 MG tablet Take 80 mg by mouth at bedtime. 11/13/14  Yes [provider]  torsemide (DEMADEX) 5 MG tablet Take 5 mg by mouth daily. 01/01/22  Yes [provider]  traMADol (ULTRAM) 50 MG tablet Take 50 mg by mouth 2 (two) times daily as needed for moderate pain. 01/01/22  Yes [provider]  Calcium Citrate 150 MG CAPS Take 2 capsules (300 mg total) by mouth daily. Patient not taking: Reported on 01/17/2022 07/16/21 08/15/21  Harvie Heck, MD  diclofenac Sodium (VOLTAREN) 1 % GEL Apply 2 g topically 4 (four) times daily. Patient not taking: Reported on 01/17/2022 07/16/21   Harvie Heck, MD  FLUoxetine (PROZAC) 40 MG capsule Take 1 capsule (40 mg total) by mouth daily. Patient not taking: Reported on 01/17/2022 07/17/21  01/17/22  Harvie Heck, MD  lidocaine (LIDODERM) 5 % Place 1 patch onto the skin daily. Remove & Discard patch within 12 hours or as directed by MD Patient not taking: Reported on 01/17/2022 04/16/21   Geradine Girt, DO      Allergies    Ace inhibitors, Codeine, Morphine and related, and Oxycodone-acetaminophen    Review of Systems   Review of Systems  Musculoskeletal:  Positive for arthralgias.  All other systems reviewed and are negative.  Physical Exam Updated Vital Signs BP 135/62    Pulse 68    Temp 98 F (36.7 C)  (Oral)    Resp 18    Ht 5' (1.524 m)    Wt 85.3 kg    SpO2 96%    BMI 36.72 kg/m  Physical Exam Vitals and nursing note reviewed.  Constitutional:      General: She is not in acute distress.    Appearance: She is well-developed.  HENT:     Head: Normocephalic and atraumatic.  Eyes:     Conjunctiva/sclera: Conjunctivae normal.  Cardiovascular:     Rate and Rhythm: Normal rate.     Pulses: Normal pulses.  Pulmonary:     Effort: Pulmonary effort is normal. No respiratory distress.  Abdominal:     Palpations: Abdomen is soft.     Tenderness: There is no abdominal tenderness.  Musculoskeletal:     Cervical back: Neck supple.     Comments: Some tenderness to palpation to foot, knee and right hip, mild swelling to bilateral lower legs, normal DP and PT pulses bilaterally, sensation to light touch intact in both legs  Skin:    General: Skin is warm and dry.     Capillary Refill: Capillary refill takes less than 2 seconds.  Neurological:     General: No focal deficit present.     Mental Status: She is alert.  Psychiatric:        Mood and Affect: Mood normal.    ED Results / Procedures / Treatments   Labs (all labs ordered are listed, but only abnormal results are displayed) Labs Reviewed  CBC WITH DIFFERENTIAL/PLATELET - Abnormal; Notable for the following components:      Result Value   WBC 3.7 (*)    RBC 3.60 (*)    Hemoglobin 10.6 (*)    HCT 33.4 (*)    All other components within normal limits  BASIC METABOLIC PANEL - Abnormal; Notable for the following components:   Potassium 3.3 (*)    Glucose, Bld 102 (*)    Calcium 8.8 (*)    GFR, Estimated 59 (*)    All other components within normal limits  BRAIN NATRIURETIC PEPTIDE - Abnormal; Notable for the following components:   B Natriuretic Peptide 194.5 (*)    All other components within normal limits  URINALYSIS, ROUTINE W REFLEX MICROSCOPIC    EKG None  Radiology DG Knee Complete 4 Views Right  Result Date:  01/17/2022 CLINICAL DATA:  76 year old female with right lower extremity and swelling since 1500 hours yesterday. EXAM: RIGHT KNEE - COMPLETE 4+ VIEW COMPARISON:  Right knee series 07/10/2021. FINDINGS: Previous ORIF right femur with both lateral and medial hardware and numerous anchoring screws and cannula. Proximal most hardware is not included today, but that visible appears stable and intact since August. Interval healing of oblique distal femoral medial metadiaphysis fracture with new callus formation. Superimposed chronic severe joint space loss at the right knee is tricompartmental. Bulky osteophytosis including in  the patellofemoral compartment. Small if any joint effusion. No acute osseous abnormality identified. No soft tissue gas. IMPRESSION: 1. Extensive prior femur ORIF. Interval healing of distal medial metadiaphysis fracture with new callus formation since August. 2. 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 3. Possible small joint effusion but no acute osseous abnormality identified. Electronically Signed   By: Genevie Ann M.D.   On: 01/17/2022 05:29   DG Foot Complete Right  Result Date: 01/17/2022 CLINICAL DATA:  76 year old female with right lower extremity and swelling since 1500 hours yesterday. Prior knee replacement. EXAM: RIGHT FOOT COMPLETE - 3+ VIEW COMPARISON:  Right ankle series 10/18/2009. FINDINGS: New osteopenia about the right foot and ankle since 2010, appears fairly advanced. First MTP joint space loss and osteophytosis but no definite acute fracture of the right 1st ray. Other metatarsals, phalanges, and midfoot also appear intact. Dorsal to the posterior calcaneus there is bulky 2.7 cm area of dystrophic calcification which is new from 2010 and appears related to the distal Achilles. No definite acute osseous abnormality. No soft tissue gas. IMPRESSION: 1. 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 No acute osseous abnormality identified. 2. Bulky dystrophic  calcification at the distal Achilles is new since 2010, posttraumatic and/or degenerative. Electronically Signed   By: Genevie Ann M.D.   On: 01/17/2022 05:26   DG Hip Unilat W or Wo Pelvis 2-3 Views Right  Result Date: 01/17/2022 CLINICAL DATA:  76 year old female with right lower extremity and swelling since 1500 hours yesterday. EXAM: DG HIP (WITH OR WITHOUT PELVIS) 2-3V RIGHT COMPARISON:  Right hip series 04/09/2021. FINDINGS: Chronic left total hip arthroplasty. Femoral heads remain normally located. Pelvis osteopenia. No acute pelvic fracture identified. Grossly intact visible left femur. Proximal right femur and visible right femoral hardware appears intact. Nonobstructed bowel gas pattern. Chronic pelvic phleboliths. IMPRESSION: No acute fracture or dislocation identified about the right hip or pelvis. Previous right femur ORIF, left total hip arthroplasty. Electronically Signed   By: Genevie Ann M.D.   On: 01/17/2022 05:30   VAS [place] LOWER EXTREMITY VENOUS (DVT) (ONLY [facility] & [facility])  Result Date: 01/17/2022  Lower Venous DVT Study Patient Name:  [name] [name]  Date of Exam:   01/17/2022 Medical Rec #: [id number]          Accession #:    [id number] Date of Birth: 01/19/1946           Patient Gender: F Patient Age:   1 years Exam Location:  [facility] Procedure:      VAS [place] LOWER EXTREMITY VENOUS (DVT) Referring Phys: Madalyn Rob --------------------------------------------------------------------------------  Indications: Pain, and Unable to bear weight.  Limitations: Patient unable to move in to appropriate position for full popliteal evaluation. Comparison Study: Prior negative right LEV done 11/01/20 Performing Technologist: Sharion Dove RVS  Examination Guidelines: A complete evaluation includes B-mode imaging, spectral Doppler, color Doppler, and power Doppler as needed of all accessible portions of each vessel. Bilateral testing is considered an integral part of a complete examination. Limited  examinations for reoccurring indications may be performed as noted. The reflux portion of the exam is performed with the patient in reverse Trendelenburg.  +---------+---------------+---------+-----------+----------+-------------------+  RIGHT     Compressibility Phasicity Spontaneity Properties Thrombus Aging       +---------+---------------+---------+-----------+----------+-------------------+  CFV       Full            Yes       Yes                                         +---------+---------------+---------+-----------+----------+-------------------+  SFJ       Full                                                                  +---------+---------------+---------+-----------+----------+-------------------+  FV Prox   Full                                                                  +---------+---------------+---------+-----------+----------+-------------------+  FV Mid    Full                                                                  +---------+---------------+---------+-----------+----------+-------------------+  FV Distal Full                                                                  +---------+---------------+---------+-----------+----------+-------------------+  PFV       Full                                                                  +---------+---------------+---------+-----------+----------+-------------------+  POP                       Yes       Yes                    patent by color and                                                              Doppler              +---------+---------------+---------+-----------+----------+-------------------+  PTV       Full                                                                  +---------+---------------+---------+-----------+----------+-------------------+  PERO      Full                                                                  +---------+---------------+---------+-----------+----------+-------------------+    +----+---------------+---------+-----------+----------+--------------+  LEFT Compressibility Phasicity Spontaneity Properties Thrombus Aging  +----+---------------+---------+-----------+----------+--------------+  CFV  Full            Yes       Yes                                    +----+---------------+---------+-----------+----------+--------------+    Summary: RIGHT: - There is no evidence of deep vein thrombosis in the lower extremity.  - Diffuse edema noted in the popliteal fossa and calf  LEFT: - No evidence of common femoral vein obstruction.  *See table(s) above for measurements and observations.    Preliminary     Procedures Procedures    Medications Ordered in ED Medications  lidocaine (LIDODERM) 5 % 1 patch (1 patch Transdermal Patch Applied 01/17/22 0614)  methocarbamol (ROBAXIN) tablet 500 mg (has no administration in time range)  fentaNYL (SUBLIMAZE) injection 25 mcg (has no administration in time range)  fentaNYL (SUBLIMAZE) injection 50 mcg (50 mcg Intravenous Given 01/17/22 0444)    ED Course/ Medical Decision Making/ A&P                           Medical Decision Making Amount and/or Complexity of Data Reviewed Labs: ordered. Radiology: ordered. ECG/medicine tests: ordered.  Risk Prescription drug management.   76 year old that he presented to ER with concern for right leg pain.  Having primarily foot and knee pain.  Had some tenderness to both foot and knee, good joint ROM, no significant joint swelling noted on exam and no fever, doubt septic joint.  X-rays obtained, personally reviewed imaging.  No fracture or dislocation noted.  Has extensive osteoarthritic changes in multiple joints.  Suspect this is most likely etiology.  No specific falls, lower suspicion for occult fracture.  She has slightly more swelling in the right as opposed to left leg.  We will check DVT study.  Will ask case management to evaluate patient and set up home health.  She was able to  successfully ambulate in hallway with a walker.  While awaiting eval by TOC and DVT study, signed out to Dr. Karle Starch.       Final Clinical Impression(s) / ED Diagnoses Final diagnoses:  Right knee pain, unspecified chronicity  Right foot pain    Rx / DC Orders ED Discharge Orders     None         Lucrezia Starch, MD 01/17/22 [phone]

## 2022-01-17 NOTE — ED Notes (Signed)
C/o leg pain

## 2022-01-17 NOTE — NC FL2 (Signed)
[place] LEVEL OF CARE SCREENING TOOL     IDENTIFICATION  Patient Name: [name] [name] Birthdate: 01/06/1946 Sex: female Admission Date (Current Location): 01/17/2022  [facility] [place] and [place] Number:  Herbalist and Address:  [facility] [place], [place] [address] [place], [place] [address]      Provider Number: [id number]  Attending Physician Name and Address:  Default, Provider, MD  Relative Name and Phone Number:  Lovena Neighbours sister [phone]    Current Level of Care: Other (Comment) (ED) Recommended Level of Care: [place] Prior Approval Number:    Date Approved/Denied:   PASRR Number: [id number]  Discharge Plan: SNF    Current Diagnoses: Patient Active Problem List   Diagnosis Date Noted   Acute urinary retention 07/16/2021   Femur fracture, right ([place]) 07/08/2021   Hypokalemia 04/10/2021   Atrial fibrillation ([place]) 04/10/2021   Type 2 diabetes mellitus ([place]) 04/10/2021   Right hip pain 04/10/2021   Hip pain 04/09/2021   Hypoglycemia 02/04/2021   Hypothermia 02/04/2021   Hypotension 02/04/2021   Atrial fibrillation, new onset ([place]) 02/04/2021   AKI (acute kidney injury) ([place]) 02/04/2021   Atrial fibrillation with rapid ventricular response ([place]) 02/04/2021   HLD (hyperlipidemia) 12/18/2010   HYPERTENSION, BENIGN 12/18/2010   SHORTNESS OF BREATH 12/18/2010   OBSTRUCTIVE SLEEP APNEA 01/11/2008    Orientation RESPIRATION BLADDER Height & Weight     Self, Time, Place, Situation  Normal Incontinent Weight: 85.3 kg Height:  5' (152.4 cm)  BEHAVIORAL SYMPTOMS/MOOD NEUROLOGICAL BOWEL NUTRITION STATUS      Continent Diet (See discharge summary)  AMBULATORY STATUS COMMUNICATION OF NEEDS Skin   Extensive Assist Verbally  (See discharge summary)                       Personal Care Assistance Level of Assistance  Total care       Total Care Assistance: Maximum assistance   Functional Limitations  Info             SPECIAL CARE FACTORS FREQUENCY  PT (By licensed PT), OT (By licensed OT)     PT Frequency: 5 x week OT Frequency: 5X week            Contractures Contractures Info: Not present    Additional Factors Info  Code Status Code Status Info: Full code             Current Medications (01/17/2022):  This is the current hospital active medication list Current Facility-Administered Medications  Medication Dose Route Frequency Provider Last Rate Last Admin   acetaminophen (TYLENOL) tablet 650 mg  650 mg Oral Q6H PRN Truddie Hidden, MD       ALPRAZolam Duanne Moron) tablet 0.25 mg  0.25 mg Oral BID PRN Truddie Hidden, MD       amiodarone (PACERONE) tablet 200 mg  200 mg Oral BID Calvert Cantor B, MD   200 mg at 01/17/22 1055   apixaban (ELIQUIS) tablet 5 mg  5 mg Oral BID Truddie Hidden, MD   5 mg at 01/17/22 1055   atorvastatin (LIPITOR) tablet 40 mg  40 mg Oral Daily Truddie Hidden, MD   40 mg at 01/17/22 1055   busPIRone (BUSPAR) tablet 10 mg  10 mg Oral BID Truddie Hidden, MD   10 mg at 01/17/22 1055   escitalopram (LEXAPRO) tablet 10 mg  10 mg Oral Daily Truddie Hidden, MD  10 mg at 01/17/22 1055   famotidine (PEPCID) tablet 40 mg  40 mg Oral Daily PRN Truddie Hidden, MD       ferrous gluconate [facility]) tablet 324 mg  324 mg Oral Daily Truddie Hidden, MD   324 mg at 01/17/22 1055   furosemide (LASIX) tablet 40 mg  40 mg Oral Daily Truddie Hidden, MD   40 mg at 01/17/22 1055   gabapentin (NEURONTIN) capsule 100 mg  100 mg Oral Daily Calvert Cantor B, MD   100 mg at 01/17/22 1055   lidocaine (LIDODERM) 5 % 1 patch  1 patch Transdermal Once Lucrezia Starch, MD   1 patch at 01/17/22 6962   loperamide (IMODIUM) capsule 2 mg  2 mg Oral PRN Truddie Hidden, MD       magnesium oxide (MAG-OX) tablet 400 mg  400 mg Oral Daily Calvert Cantor B, MD   400 mg at 01/17/22 1056   Menthol 5 % PTCH   Apply externally PRN Truddie Hidden, MD       [START ON 01/18/2022] potassium chloride (KLOR-CON) CR tablet 10 mEq  10 mEq Oral Q M,W,F Truddie Hidden, MD       torsemide [facility]) tablet 5 mg  5 mg Oral Daily Truddie Hidden, MD   5 mg at 01/17/22 1054   traMADol (ULTRAM) tablet 50 mg  50 mg Oral BID PRN Truddie Hidden, MD       Current Outpatient Medications  Medication Sig Dispense Refill   acetaminophen (TYLENOL) 650 MG CR tablet Take 1,300 mg by mouth every 8 (eight) hours as needed for pain.     ALPRAZolam (XANAX) 0.25 MG tablet Take 1 tablet (0.25 mg total) by mouth 2 (two) times daily as needed for anxiety. 6 tablet 0   amiodarone (PACERONE) 200 MG tablet Take 200 mg by mouth 2 (two) times daily.     apixaban (ELIQUIS) 5 MG TABS tablet Take 1 tablet (5 mg total) by mouth 2 (two) times daily. 60 tablet 0   busPIRone (BUSPAR) 10 MG tablet Take 10 mg by mouth 2 (two) times daily.     escitalopram (LEXAPRO) 10 MG tablet Take 10 mg by mouth daily.     famotidine (PEPCID) 40 MG tablet Take 40 mg by mouth daily as needed for indigestion.  3   ferrous gluconate (FERGON) 324 MG tablet Take 324 mg by mouth daily.     furosemide (LASIX) 40 MG tablet Take 1 tablet (40 mg total) by mouth daily. 90 tablet 3   gabapentin (NEURONTIN) 100 MG capsule Take 100 mg by mouth daily.     loperamide (IMODIUM A-D) 2 MG tablet Take 1 tablet (2 mg total) by mouth as needed for diarrhea or loose stools. 30 tablet 0   Magnesium Oxide 400 MG CAPS Take 1 capsule (400 mg total) by mouth daily. 30 capsule 0   Menthol, Topical Analgesic, (ICY HOT BACK EX) Apply 1 application topically as needed (arthritis pain).     potassium chloride (KLOR-CON) 10 MEQ tablet Take 10 mEq by mouth every Monday, Wednesday, and Friday.     simvastatin (ZOCOR) 80 MG tablet Take 80 mg by mouth at bedtime.  3   torsemide (DEMADEX) 5 MG tablet Take 5 mg by mouth daily.     traMADol (ULTRAM) 50 MG tablet Take 50 mg by mouth 2 (two) times daily as needed for moderate  pain.     Calcium Citrate 150  MG CAPS Take 2 capsules (300 mg total) by mouth daily. (Patient not taking: Reported on 01/17/2022) 60 capsule 0   diclofenac Sodium (VOLTAREN) 1 % GEL Apply 2 g topically 4 (four) times daily. (Patient not taking: Reported on 01/17/2022) 100 g 0   FLUoxetine (PROZAC) 40 MG capsule Take 1 capsule (40 mg total) by mouth daily. (Patient not taking: Reported on 01/17/2022) 30 capsule 0   lidocaine (LIDODERM) 5 % Place 1 patch onto the skin daily. Remove & Discard patch within 12 hours or as directed by MD (Patient not taking: Reported on 01/17/2022) 30 patch 0     Discharge Medications: Please see discharge summary for a list of discharge medications.  Relevant Imaging Results:  Relevant Lab Results:   Additional Information SSN [id number]  Verdell Carmine, RN

## 2022-01-17 NOTE — ED Notes (Signed)
Repositioned patient in bed to eat dinner. Patient eating comfortably on stretcher at this time.

## 2022-01-17 NOTE — Care Management (Addendum)
PT paged to see when they can assess the patient. Someone is assigned and will be here to evaluate her

## 2022-01-17 NOTE — TOC Initial Note (Addendum)
Transition of Care [facility]) - Initial/Assessment Note    Patient Details  Name: [name] [name] MRN: [id number] Date of Birth: [birth year]  [facility] of [facility]) CM/SW Contact:    Verdell Carmine, RN Phone Number: 01/17/2022, 9:00 AM  Clinical Narrative:                  Patient reviewed orders received. Contacted [facility] for acceptance of home health.patient has walker at home. Patient lethargic, stating she cannot walk, to staff, they had a very difficult time with walker ambulation last night. PT referral placed for their recommendation  CM will follow  0930     Patient Goals and CMS Choice        Expected Discharge Plan and [place] with home health services                                            Prior Living Arrangements/Services                       Activities of Daily Living      Permission Sought/Granted                  Emotional Assessment              Admission diagnosis:  LEG PAIN Patient Active Problem List   Diagnosis Date Noted   Acute urinary retention 07/16/2021   Femur fracture, right ([place]) 07/08/2021   Hypokalemia 04/10/2021   Atrial fibrillation ([place]) 04/10/2021   Type 2 diabetes mellitus ([place]) 04/10/2021   Right hip pain 04/10/2021   Hip pain 04/09/2021   Hypoglycemia 02/04/2021   Hypothermia 02/04/2021   Hypotension 02/04/2021   Atrial fibrillation, new onset ([place]) 02/04/2021   AKI (acute kidney injury) ([place]) 02/04/2021   Atrial fibrillation with rapid ventricular response ([place]) 02/04/2021   HLD (hyperlipidemia) 12/18/2010   HYPERTENSION, BENIGN 12/18/2010   SHORTNESS OF BREATH 12/18/2010   OBSTRUCTIVE SLEEP APNEA 01/11/2008   PCP:  Chesley Noon, MD Pharmacy:   [place], [place] - [address] [place] [address] Phone: [phone] Fax: [place] [address] [place] [address] Phone: [phone] Fax: [phone]     Social Determinants of Health (SDOH) Interventions    Readmission Risk Interventions No flowsheet data found.

## 2022-01-18 LAB — URINALYSIS, ROUTINE W REFLEX MICROSCOPIC
Bilirubin Urine: NEGATIVE
Glucose, UA: NEGATIVE mg/dL
Hgb urine dipstick: NEGATIVE
Ketones, ur: NEGATIVE mg/dL
Nitrite: NEGATIVE
Protein, ur: NEGATIVE mg/dL
Specific Gravity, Urine: <1.005 — ABNORMAL LOW (ref 1.005–1.030)
pH: 7 (ref 5.0–8.0)

## 2022-01-18 LAB — URINALYSIS, MICROSCOPIC (REFLEX)

## 2022-01-18 LAB — CBG MONITORING, ED
Glucose-Capillary: 83 mg/dL (ref 70–99)
Glucose-Capillary: 120 mg/dL — ABNORMAL HIGH (ref 70–99)

## 2022-01-18 NOTE — ED Notes (Signed)
Pt reporting discomfort and feeling hot, pt repositioned and provided with fan.

## 2022-01-18 NOTE — Discharge Planning (Signed)
Licensed Clinical Social Worker is seeking post-discharge placement for this patient at the following level of care: skilled nursing facility    

## 2022-01-18 NOTE — ED Notes (Signed)
Pt reporting 10/10 pain in legs, pt repositioned on side. PRN tylenol to be given.

## 2022-01-18 NOTE — Progress Notes (Signed)
[name] [name] with admissions with [facility] accepted patient for SNF placement. CSW spoke with patient and sister Constance Holster who agreed with placement. [facility] will start insurance authorization.

## 2022-01-18 NOTE — ED Notes (Signed)
Breakfast orders placed 

## 2022-01-18 NOTE — ED Notes (Addendum)
Kommor, MD made aware of hypotension. Awaiting further orders. 1 Liter bolus NS hung.

## 2022-01-19 LAB — BASIC METABOLIC PANEL
Anion gap: 9 (ref 5–15)
BUN: 21 mg/dL (ref 8–23)
CO2: 26 mmol/L (ref 22–32)
Calcium: 8.5 mg/dL — ABNORMAL LOW (ref 8.9–10.3)
Chloride: 102 mmol/L (ref 98–111)
Creatinine, Ser: 1.08 mg/dL — ABNORMAL HIGH (ref 0.44–1.00)
GFR, Estimated: 53 mL/min — ABNORMAL LOW (ref >60)
Glucose, Bld: 94 mg/dL (ref 70–99)
Potassium: 3.5 mmol/L (ref 3.5–5.1)
Sodium: 137 mmol/L (ref 135–145)

## 2022-01-19 LAB — MAGNESIUM: Magnesium: 1.7 mg/dL (ref 1.7–2.4)

## 2022-01-19 MED ORDER — TRAMADOL HCL 50 MG PO TABS
50.0000 mg | ORAL_TABLET | Freq: Two times a day (BID) | ORAL | 0 refills | Status: AC | PRN
Start: 2022-01-19 — End: ?

## 2022-01-19 MED ORDER — TRAMADOL HCL 50 MG PO TABS: 50.0000 mg | ORAL_TABLET | Freq: Two times a day (BID) | ORAL | 0 refills | Status: DC | PRN

## 2022-01-19 NOTE — Progress Notes (Signed)
CSW received notice that [name] [name] has been given to [facility]. Pt is ready to be d/c'ed to facility. # for report [phone] Room #413-2 RN notified. CSW called Pt's sister, Constance Holster, to update.
